# Patient Record
Sex: Female | Born: 1940 | Race: White | Hispanic: No | Marital: Married | Smoking: Never smoker
Health system: Southern US, Community
[De-identification: ages and names within clinical notes are randomized; demographics above are authoritative.]

## PROBLEM LIST (undated history)

## (undated) DIAGNOSIS — K259 Gastric ulcer, unspecified as acute or chronic, without hemorrhage or perforation: Secondary | ICD-10-CM

## (undated) DIAGNOSIS — I10 Essential (primary) hypertension: Secondary | ICD-10-CM

## (undated) DIAGNOSIS — R7303 Prediabetes: Secondary | ICD-10-CM

## (undated) DIAGNOSIS — E7801 Familial hypercholesterolemia: Secondary | ICD-10-CM

## (undated) DIAGNOSIS — I2510 Atherosclerotic heart disease of native coronary artery without angina pectoris: Secondary | ICD-10-CM

## (undated) DIAGNOSIS — D649 Anemia, unspecified: Secondary | ICD-10-CM

## (undated) DIAGNOSIS — M1990 Unspecified osteoarthritis, unspecified site: Secondary | ICD-10-CM

## (undated) DIAGNOSIS — Z9889 Other specified postprocedural states: Secondary | ICD-10-CM

## (undated) DIAGNOSIS — Z9289 Personal history of other medical treatment: Secondary | ICD-10-CM

## (undated) DIAGNOSIS — I739 Peripheral vascular disease, unspecified: Secondary | ICD-10-CM

## (undated) DIAGNOSIS — E039 Hypothyroidism, unspecified: Secondary | ICD-10-CM

## (undated) DIAGNOSIS — E785 Hyperlipidemia, unspecified: Secondary | ICD-10-CM

## (undated) DIAGNOSIS — E079 Disorder of thyroid, unspecified: Secondary | ICD-10-CM

## (undated) HISTORY — PX: CARDIAC SURGERY: SHX584

## (undated) HISTORY — DX: Hypothyroidism, unspecified: E03.9

## (undated) HISTORY — DX: Prediabetes: R73.03

## (undated) HISTORY — PX: SHOULDER SURGERY: SHX246

## (undated) HISTORY — DX: Personal history of other medical treatment: Z92.89

## (undated) HISTORY — DX: Peripheral vascular disease, unspecified: I73.9

## (undated) HISTORY — PX: APPENDECTOMY: SHX54

## (undated) HISTORY — DX: Familial hypercholesterolemia: E78.01

## (undated) HISTORY — PX: ABDOMINAL HYSTERECTOMY: SHX81

## (undated) HISTORY — PX: HEMORRHOID SURGERY: SHX153

---

## 2014-07-19 DIAGNOSIS — N6019 Diffuse cystic mastopathy of unspecified breast: Secondary | ICD-10-CM | POA: Insufficient documentation

## 2014-07-19 DIAGNOSIS — R030 Elevated blood-pressure reading, without diagnosis of hypertension: Secondary | ICD-10-CM | POA: Insufficient documentation

## 2014-07-19 DIAGNOSIS — D509 Iron deficiency anemia, unspecified: Secondary | ICD-10-CM | POA: Insufficient documentation

## 2014-07-19 DIAGNOSIS — I10 Essential (primary) hypertension: Secondary | ICD-10-CM | POA: Diagnosis present

## 2014-07-19 DIAGNOSIS — M7050 Other bursitis of knee, unspecified knee: Secondary | ICD-10-CM | POA: Insufficient documentation

## 2014-07-19 DIAGNOSIS — H903 Sensorineural hearing loss, bilateral: Secondary | ICD-10-CM | POA: Insufficient documentation

## 2014-07-19 DIAGNOSIS — J309 Allergic rhinitis, unspecified: Secondary | ICD-10-CM | POA: Insufficient documentation

## 2014-07-19 DIAGNOSIS — D126 Benign neoplasm of colon, unspecified: Secondary | ICD-10-CM | POA: Insufficient documentation

## 2014-07-19 DIAGNOSIS — K219 Gastro-esophageal reflux disease without esophagitis: Secondary | ICD-10-CM | POA: Insufficient documentation

## 2014-07-19 DIAGNOSIS — R21 Rash and other nonspecific skin eruption: Secondary | ICD-10-CM | POA: Insufficient documentation

## 2014-07-19 DIAGNOSIS — Z91040 Latex allergy status: Secondary | ICD-10-CM | POA: Insufficient documentation

## 2014-08-31 DIAGNOSIS — L570 Actinic keratosis: Secondary | ICD-10-CM | POA: Insufficient documentation

## 2014-08-31 DIAGNOSIS — R928 Other abnormal and inconclusive findings on diagnostic imaging of breast: Secondary | ICD-10-CM | POA: Insufficient documentation

## 2014-08-31 DIAGNOSIS — D126 Benign neoplasm of colon, unspecified: Secondary | ICD-10-CM | POA: Insufficient documentation

## 2014-08-31 DIAGNOSIS — K922 Gastrointestinal hemorrhage, unspecified: Secondary | ICD-10-CM | POA: Diagnosis present

## 2016-07-03 ENCOUNTER — Other Ambulatory Visit: Payer: Self-pay

## 2016-07-03 ENCOUNTER — Inpatient Hospital Stay (HOSPITAL_BASED_OUTPATIENT_CLINIC_OR_DEPARTMENT_OTHER)
Admission: EM | Admit: 2016-07-03 | Discharge: 2016-07-13 | DRG: 234 | Disposition: A | Discharge status: Home Health | Payer: Medicare Other | Attending: Surgery | Admitting: Surgery

## 2016-07-03 ENCOUNTER — Encounter (HOSPITAL_BASED_OUTPATIENT_CLINIC_OR_DEPARTMENT_OTHER): Payer: Self-pay | Admitting: *Deleted

## 2016-07-03 ENCOUNTER — Emergency Department (HOSPITAL_BASED_OUTPATIENT_CLINIC_OR_DEPARTMENT_OTHER): Payer: Medicare Other

## 2016-07-03 DIAGNOSIS — Z90710 Acquired absence of both cervix and uterus: Secondary | ICD-10-CM

## 2016-07-03 DIAGNOSIS — J90 Pleural effusion, not elsewhere classified: Secondary | ICD-10-CM

## 2016-07-03 DIAGNOSIS — J9811 Atelectasis: Secondary | ICD-10-CM

## 2016-07-03 DIAGNOSIS — Z79899 Other long term (current) drug therapy: Secondary | ICD-10-CM

## 2016-07-03 DIAGNOSIS — Z8711 Personal history of peptic ulcer disease: Secondary | ICD-10-CM

## 2016-07-03 DIAGNOSIS — I358 Other nonrheumatic aortic valve disorders: Secondary | ICD-10-CM | POA: Diagnosis present

## 2016-07-03 DIAGNOSIS — Z8249 Family history of ischemic heart disease and other diseases of the circulatory system: Secondary | ICD-10-CM

## 2016-07-03 DIAGNOSIS — Z823 Family history of stroke: Secondary | ICD-10-CM

## 2016-07-03 DIAGNOSIS — I10 Essential (primary) hypertension: Secondary | ICD-10-CM | POA: Diagnosis present

## 2016-07-03 DIAGNOSIS — I340 Nonrheumatic mitral (valve) insufficiency: Secondary | ICD-10-CM | POA: Diagnosis present

## 2016-07-03 DIAGNOSIS — Z880 Allergy status to penicillin: Secondary | ICD-10-CM

## 2016-07-03 DIAGNOSIS — K259 Gastric ulcer, unspecified as acute or chronic, without hemorrhage or perforation: Secondary | ICD-10-CM | POA: Diagnosis present

## 2016-07-03 DIAGNOSIS — E785 Hyperlipidemia, unspecified: Secondary | ICD-10-CM | POA: Diagnosis present

## 2016-07-03 DIAGNOSIS — K5900 Constipation, unspecified: Secondary | ICD-10-CM | POA: Diagnosis not present

## 2016-07-03 DIAGNOSIS — I700 Atherosclerosis of aorta: Secondary | ICD-10-CM | POA: Diagnosis present

## 2016-07-03 DIAGNOSIS — I25110 Atherosclerotic heart disease of native coronary artery with unstable angina pectoris: Principal | ICD-10-CM | POA: Diagnosis present

## 2016-07-03 DIAGNOSIS — I200 Unstable angina: Secondary | ICD-10-CM | POA: Diagnosis present

## 2016-07-03 DIAGNOSIS — Z951 Presence of aortocoronary bypass graft: Secondary | ICD-10-CM

## 2016-07-03 DIAGNOSIS — E871 Hypo-osmolality and hyponatremia: Secondary | ICD-10-CM | POA: Diagnosis present

## 2016-07-03 DIAGNOSIS — D649 Anemia, unspecified: Secondary | ICD-10-CM

## 2016-07-03 DIAGNOSIS — D62 Acute posthemorrhagic anemia: Secondary | ICD-10-CM | POA: Diagnosis not present

## 2016-07-03 DIAGNOSIS — R079 Chest pain, unspecified: Secondary | ICD-10-CM | POA: Diagnosis not present

## 2016-07-03 DIAGNOSIS — E039 Hypothyroidism, unspecified: Secondary | ICD-10-CM | POA: Diagnosis present

## 2016-07-03 DIAGNOSIS — E8770 Fluid overload, unspecified: Secondary | ICD-10-CM | POA: Diagnosis not present

## 2016-07-03 DIAGNOSIS — G43909 Migraine, unspecified, not intractable, without status migrainosus: Secondary | ICD-10-CM | POA: Diagnosis not present

## 2016-07-03 DIAGNOSIS — D696 Thrombocytopenia, unspecified: Secondary | ICD-10-CM | POA: Diagnosis not present

## 2016-07-03 DIAGNOSIS — Z66 Do not resuscitate: Secondary | ICD-10-CM | POA: Diagnosis present

## 2016-07-03 DIAGNOSIS — Z01818 Encounter for other preprocedural examination: Secondary | ICD-10-CM

## 2016-07-03 HISTORY — DX: Gastric ulcer, unspecified as acute or chronic, without hemorrhage or perforation: K25.9

## 2016-07-03 HISTORY — DX: Other specified postprocedural states: Z98.890

## 2016-07-03 HISTORY — DX: Essential (primary) hypertension: I10

## 2016-07-03 HISTORY — DX: Unspecified osteoarthritis, unspecified site: M19.90

## 2016-07-03 HISTORY — DX: Hyperlipidemia, unspecified: E78.5

## 2016-07-03 HISTORY — DX: Anemia, unspecified: D64.9

## 2016-07-03 LAB — CBC
HCT: 32 % — ABNORMAL LOW (ref 36.0–46.0)
Hemoglobin: 10.6 g/dL — ABNORMAL LOW (ref 12.0–15.0)
MCH: 30.5 pg (ref 26.0–34.0)
MCHC: 33.1 g/dL (ref 30.0–36.0)
MCV: 92 fL (ref 78.0–100.0)
Platelets: 373 10*3/uL (ref 150–400)
RBC: 3.48 MIL/uL — ABNORMAL LOW (ref 3.87–5.11)
RDW: 14.2 % (ref 11.5–15.5)
WBC: 6.7 10*3/uL (ref 4.0–10.5)

## 2016-07-03 LAB — BASIC METABOLIC PANEL
Anion gap: 9 (ref 5–15)
BUN: 19 mg/dL (ref 6–20)
CO2: 25 mmol/L (ref 22–32)
Calcium: 9.3 mg/dL (ref 8.9–10.3)
Chloride: 96 mmol/L — ABNORMAL LOW (ref 101–111)
Creatinine, Ser: 0.78 mg/dL (ref 0.44–1.00)
GFR calc Af Amer: >60 mL/min (ref >60)
GFR calc non Af Amer: >60 mL/min (ref >60)
Glucose, Bld: 98 mg/dL (ref 65–99)
Potassium: 4 mmol/L (ref 3.5–5.1)
Sodium: 130 mmol/L — ABNORMAL LOW (ref 135–145)

## 2016-07-03 LAB — TROPONIN I
Troponin I: <0.03 ng/mL (ref <0.03)
Troponin I: <0.03 ng/mL (ref <0.03)

## 2016-07-03 LAB — PROTIME-INR
INR: 0.81
Prothrombin Time: 11.2 seconds — ABNORMAL LOW (ref 11.4–15.2)

## 2016-07-03 MED ORDER — ENOXAPARIN SODIUM 40 MG/0.4ML ~~LOC~~ SOLN
40.0000 mg | SUBCUTANEOUS | Status: DC
  Administered 2016-07-03: 40 mg via SUBCUTANEOUS
  Filled 2016-07-03: qty 0.4

## 2016-07-03 MED ORDER — ASPIRIN 81 MG PO CHEW
162.0000 mg | CHEWABLE_TABLET | Freq: Once | ORAL | Status: AC
  Administered 2016-07-03: 162 mg via ORAL
  Filled 2016-07-03: qty 2

## 2016-07-03 MED ORDER — ASPIRIN 81 MG PO CHEW: 81.0000 mg | CHEWABLE_TABLET | Freq: Once | ORAL | Status: DC

## 2016-07-03 MED ORDER — TRAMADOL HCL 50 MG PO TABS
50.0000 mg | ORAL_TABLET | Freq: Every day | ORAL | Status: DC
  Administered 2016-07-03 – 2016-07-06 (×4): 50 mg via ORAL
  Filled 2016-07-03 (×4): qty 1

## 2016-07-03 MED ORDER — NITROGLYCERIN 0.4 MG SL SUBL
0.4000 mg | SUBLINGUAL_TABLET | SUBLINGUAL | Status: DC | PRN
  Administered 2016-07-03 (×3): 0.4 mg via SUBLINGUAL
  Filled 2016-07-03 (×3): qty 1

## 2016-07-03 MED ORDER — NITROGLYCERIN 2 % TD OINT
0.5000 [in_us] | TOPICAL_OINTMENT | Freq: Four times a day (QID) | TRANSDERMAL | Status: DC
  Administered 2016-07-03 – 2016-07-04 (×3): 0.5 [in_us] via TOPICAL
  Filled 2016-07-03: qty 30
  Filled 2016-07-03: qty 1

## 2016-07-03 MED ORDER — ACETAMINOPHEN 325 MG PO TABS
650.0000 mg | ORAL_TABLET | Freq: Once | ORAL | Status: AC
  Administered 2016-07-03: 650 mg via ORAL
  Filled 2016-07-03: qty 2

## 2016-07-03 MED ORDER — ONDANSETRON HCL 4 MG/2ML IJ SOLN
4.0000 mg | Freq: Four times a day (QID) | INTRAMUSCULAR | Status: DC | PRN
  Administered 2016-07-04: 4 mg via INTRAVENOUS
  Filled 2016-07-03: qty 2

## 2016-07-03 MED ORDER — ACETAMINOPHEN 325 MG PO TABS
650.0000 mg | ORAL_TABLET | ORAL | Status: DC | PRN
  Administered 2016-07-04 (×2): 650 mg via ORAL
  Filled 2016-07-03 (×2): qty 2

## 2016-07-03 MED ORDER — MORPHINE SULFATE (PF) 2 MG/ML IV SOLN: 2.0000 mg | INTRAVENOUS | Status: DC | PRN

## 2016-07-03 MED ORDER — AMLODIPINE BESYLATE 5 MG PO TABS
5.0000 mg | ORAL_TABLET | Freq: Every day | ORAL | Status: DC
  Administered 2016-07-04 – 2016-07-06 (×3): 5 mg via ORAL
  Filled 2016-07-03 (×3): qty 1

## 2016-07-03 MED ORDER — ROPINIROLE HCL 0.5 MG PO TABS
0.5000 mg | ORAL_TABLET | Freq: Every day | ORAL | Status: DC
  Administered 2016-07-03 – 2016-07-06 (×4): 0.5 mg via ORAL
  Filled 2016-07-03 (×4): qty 1

## 2016-07-03 NOTE — ED Notes (Signed)
Attempted report to tina, on 3W at [facility].

## 2016-07-03 NOTE — H&P (Signed)
History and Physical    Katherine Hall [id number] DOB: 1941-03-28 DOA: 07/03/2016   PCP: No PCP Per Patient Chief Complaint:  Chief Complaint  Patient presents with  . Chest Pain    HPI: Katherine Hall is a 75 y.o. female with medical history significant of HTN, HLD, extensive family history of CAD.  Patient presents to the ED at [facility] with c/o chest pain.  Symptoms onset this morning with radiation to her throat.  Improved with 2 baby ASA this morning before recurring.  She presents to the ED for evaluation.  Pain has been intermittent since its sudden onset.  Mild SOB.  Currently CP free after NTG patch put on although now she has a migraine headache.  Review of Systems: As per HPI otherwise 10 point review of systems negative.    Past Medical History:  Diagnosis Date  . HLD (hyperlipidemia)   . HTN (hypertension)     Past Surgical History:  Procedure Laterality Date  . ABDOMINAL HYSTERECTOMY    . APPENDECTOMY    . CARDIAC SURGERY     cyst, not on heart.     reports that she has never smoked. She has never used smokeless tobacco. She reports that she does not drink alcohol or use drugs.  Allergies  Allergen Reactions  . Penicillins     Family History  Problem Relation Age of Onset  . Coronary artery disease Mother   . Coronary artery disease Father   . Coronary artery disease Sister   . Coronary artery disease Brother       Prior to Admission medications   Medication Sig Start Date End Date Taking? Authorizing Provider  acyclovir (ZOVIRAX) 400 MG tablet Take 400 mg by mouth 5 (five) times daily.   Yes Historical Provider, MD  amLODipine (NORVASC) 5 MG tablet Take 5 mg by mouth daily.   Yes Historical Provider, MD    Physical Exam: Vitals:   07/03/16 2000 07/03/16 2015 07/03/16 2019 07/03/16 2106  BP: 129/66  142/59 (!) 168/66  Pulse: (!) 59 64 69 66  Resp: 18 15 16 20   Temp:   98.9 F (37.2 C) 98.3 F (36.8 C)  TempSrc:    Oral  SpO2: 94% 97% 97% 97%    Weight:    54.7 kg (120 lb 11.2 oz)  Height:    4\' 10"  (1.473 m)      Constitutional: NAD, calm, comfortable Eyes: PERRL, lids and conjunctivae normal ENMT: Mucous membranes are moist. Posterior pharynx clear of any exudate or lesions.Normal dentition.  Neck: normal, supple, no masses, no thyromegaly Respiratory: clear to auscultation bilaterally, no wheezing, no crackles. Normal respiratory effort. No accessory muscle use.  Cardiovascular: Regular rate and rhythm, no murmurs / rubs / gallops. No extremity edema. 2+ pedal pulses. No carotid bruits.  Abdomen: no tenderness, no masses palpated. No hepatosplenomegaly. Bowel sounds positive.  Musculoskeletal: no clubbing / cyanosis. No joint deformity upper and lower extremities. Good ROM, no contractures. Normal muscle tone.  Skin: no rashes, lesions, ulcers. No induration Neurologic: CN 2-12 grossly intact. Sensation intact, DTR normal. Strength 5/5 in all 4.  Psychiatric: Normal judgment and insight. Alert and oriented x 3. Normal mood.    Labs on Admission: I have personally reviewed following labs and imaging studies  CBC:  Recent Labs Lab 07/03/16 1620  WBC 6.7  HGB 10.6*  HCT 32.0*  MCV 92.0  PLT [id number]   Basic Metabolic Panel:  Recent Labs Lab 07/03/16 1620  NA 130*  K 4.0  CL 96*  CO2 25  GLUCOSE 98  BUN 19  CREATININE 0.78  CALCIUM 9.3   GFR: Estimated Creatinine Clearance: 44.5 mL/min (by C-G formula based on SCr of 0.8 mg/dL). Liver Function Tests: No results for input(s): AST, ALT, ALKPHOS, BILITOT, PROT, ALBUMIN in the last 168 hours. No results for input(s): LIPASE, AMYLASE in the last 168 hours. No results for input(s): AMMONIA in the last 168 hours. Coagulation Profile:  Recent Labs Lab 07/03/16 1620  INR 0.81   Cardiac Enzymes:  Recent Labs Lab 07/03/16 1620  TROPONINI <0.03   BNP (last 3 results) No results for input(s): PROBNP in the last 8760 hours. HbA1C: No results for input(s):  HGBA1C in the last 72 hours. CBG: No results for input(s): GLUCAP in the last 168 hours. Lipid Profile: No results for input(s): CHOL, HDL, LDLCALC, TRIG, CHOLHDL, LDLDIRECT in the last 72 hours. Thyroid Function Tests: No results for input(s): TSH, T4TOTAL, FREET4, T3FREE, THYROIDAB in the last 72 hours. Anemia Panel: No results for input(s): VITAMINB12, FOLATE, FERRITIN, TIBC, IRON, RETICCTPCT in the last 72 hours. Urine analysis: No results found for: COLORURINE, APPEARANCEUR, LABSPEC, PHURINE, GLUCOSEU, HGBUR, BILIRUBINUR, KETONESUR, PROTEINUR, UROBILINOGEN, NITRITE, LEUKOCYTESUR Sepsis Labs: @LABRCNTIP (procalcitonin:4,lacticidven:4) )No results found for this or any previous visit (from the past 240 hour(s)).   Radiological Exams on Admission: Dg Chest 2 View  Result Date: 07/03/2016 CLINICAL DATA:  Chest pain under left breast for 2 days. EXAM: CHEST  2 VIEW COMPARISON:  None. FINDINGS: The heart size is normal. The lungs are clear. The visualized soft tissues and bony thorax are unremarkable. Patient is status post median sternotomy. Atherosclerotic calcifications are present in the aorta. Slight curvature is present of thoraco lumbar spine. Degenerative changes of the shoulder are worse on the right. IMPRESSION: 1. No acute cardiopulmonary disease. Electronically Signed   By: [name] M.D.   On: 07/03/2016 17:30   EKG: Independently reviewed.  Assessment/Plan Principal Problem:   Chest pain    Chest pain -  CP obs pathway  Serial trops  Tele monitor  Stress test in AM  NPO after midnight   DVT prophylaxis: Lovenox Code Status: DNR - confirmed with patient Family Communication: No family in room Consults called: None Admission status: Admit to obs   Khamani Fairley, [place] [facility] Pager [phone] from 7PM-7AM  If 7AM-7PM, please contact the day physician for the patient [email or web address] Password [email or web address]  07/03/2016, 10:00 PM

## 2016-07-03 NOTE — Progress Notes (Signed)
Patient ID: Katherine Hall, female   DOB: 10/10/1941, 75 y.o.   MRN: [id number]  Accepted to telemetry bed/Observation.  Please call the floor manager at extension [phone] once the patient arrives to the unit for admitting physician assignment.        Per Dr. Gilford Raid.  . Chest Pain    HPI Comments: [name] is a 75 y.o. female with h/o cardiac surgery (cyst) who presents to the Emergency Department complaining of sudden onset, constant left chest pain radiating to her throat onset one day ago. Pt also has associated chills and mild shortness of breath. Pt is currently ambulatory. Pt took two baby Aspirin this morning with mild relief. She is compliant with her medications. Pt has not had a recent stress test in a while. Family h/o heart disease. Denies abdominal pain and leg swelling.  EKG Vent. rate 63 BPM PR interval 168 ms QRS duration 84 ms QT/QTc 398/407 ms P-R-T axes 66 57 56 Normal sinus rhythm Cannot rule out Anterior infarct , age undetermined Abnormal ECG   Component Value Units  Troponin I [id number] Collected: 07/03/16 1620  Updated: 07/03/16 1655   Specimen Type: Blood   Specimen Source: Vein    Troponin I <0.03 ng/mL  Basic metabolic panel [id number] (Abnormal) Collected: 07/03/16 1620  Updated: 07/03/16 1651   Specimen Type: Blood    Sodium 130 (L) mmol/L   Potassium 4.0 mmol/L   Chloride 96 (L) mmol/L   CO2 25 mmol/L   Glucose, Bld 98 mg/dL   BUN 19 mg/dL   Creatinine, Ser 0.78 mg/dL   Calcium 9.3 mg/dL   GFR calc non Af Amer >60 mL/min   GFR calc Af Amer >60 mL/min   Anion gap 9  Protime-INR [id number] (Abnormal) Collected: 07/03/16 1620  Updated: 07/03/16 1643   Specimen Type: Blood    Prothrombin Time 11.2 (L) seconds   INR 0.81  CBC [id number] (Abnormal) Collected: 07/03/16 1620  Updated: 07/03/16 1629   Specimen Type: Blood    WBC 6.7 K/uL   RBC 3.48 (L) MIL/uL   Hemoglobin 10.6 (L) g/dL   HCT 32.0 (L) %   MCV 92.0 fL   MCH 30.5 pg    MCHC 33.1 g/dL   RDW 14.2 %   Platelets 373 K/uL   CLINICAL DATA:  Chest pain under left breast for 2 days. EXAM: CHEST  2 VIEW COMPARISON:  None. FINDINGS: The heart size is normal. The lungs are clear. The visualized soft tissues and bony thorax are unremarkable. Patient is status post median sternotomy. Atherosclerotic calcifications are present in the aorta. Slight curvature is present of thoraco lumbar spine. Degenerative changes of the shoulder are worse on the right. IMPRESSION: 1. No acute cardiopulmonary disease. Electronically Signed   By: [name] M.D.   On: 07/03/2016 17:30   Tennis Must, MD [phone].

## 2016-07-03 NOTE — ED Notes (Signed)
MD at bedside. 

## 2016-07-03 NOTE — ED Provider Notes (Signed)
[name] Note   CSN: [id number] Arrival date & time: 07/03/16  1551  First Provider Contact:  First MD Initiated Contact with Patient 07/03/16 1603   By signing my name below, I, Katherine Hall, attest that this documentation has been prepared under the direction and in the presence of [name], MD. Electronically Signed: [name], ED Scribe. 07/03/16. 4:09 PM.  History   Chief Complaint Chief Complaint  Patient presents with  . Chest Pain    HPI Comments: Katherine Hall is a 75 y.o. female with h/o cardiac surgery (cyst) who presents to the Emergency Department complaining of sudden onset, intermittent left chest pain radiating to her throat  onset one day ago. Pt also has associated chills and mild shortness of breath. Pt is currently ambulatory. Pt took two baby Aspirin this morning with mild relief. She is compliant with her medications. Pt has not had a recent stress test in a while. Family h/o heart disease. Denies abdominal pain and leg swelling.    The history is provided by the patient. No language interpreter was used.    History reviewed. No pertinent past medical history.  Patient Active Problem List   Diagnosis Date Noted  . Chest pain 07/03/2016    Past Surgical History:  Procedure Laterality Date  . ABDOMINAL HYSTERECTOMY    . APPENDECTOMY    . CARDIAC SURGERY     cyst, not on heart.    OB History    No data available       Home Medications    Prior to Admission medications   Medication Sig Start Date End Date Taking? Authorizing Provider  acyclovir (ZOVIRAX) 400 MG tablet Take 400 mg by mouth 5 (five) times daily.   Yes Historical Provider, MD  amLODipine (NORVASC) 5 MG tablet Take 5 mg by mouth daily.   Yes Historical Provider, MD    Family History History reviewed. No pertinent family history.  Social History Social History  Substance Use Topics  . Smoking status: Never Smoker  . Smokeless tobacco: Never Used  .  Alcohol use No     Allergies   Penicillins   Review of Systems Review of Systems  10 Systems reviewed and all are negative for acute change except as noted in the HPI. Physical Exam Updated Vital Signs BP 127/57   Pulse (!) 59   Resp 14   SpO2 97%   Physical Exam  Constitutional: She is oriented to person, place, and time. She appears well-developed and well-nourished. No distress.  HENT:  Head: Normocephalic.  Eyes: Conjunctivae are normal. Pupils are equal, round, and reactive to light. No scleral icterus.  Neck: Normal range of motion. Neck supple. No thyromegaly present.  Cardiovascular: Normal rate, regular rhythm and normal heart sounds.  Exam reveals no gallop and no friction rub.   No murmur heard. Pulmonary/Chest: Effort normal and breath sounds normal. No respiratory distress. She has no wheezes. She has no rales.  Abdominal: Soft. Bowel sounds are normal. She exhibits no distension. There is no tenderness. There is no rebound.  Musculoskeletal: Normal range of motion.  Neurological: She is alert and oriented to person, place, and time.  Skin: Skin is warm and dry. No rash noted.  Psychiatric: She has a normal mood and affect. Her behavior is normal.   ED Treatments / Results  DIAGNOSTIC STUDIES: Oxygen Saturation is 95% on RA, adequate by my interpretation.    COORDINATION OF CARE: 4:06 PM Discussed treatment plan with pt  at bedside which includes lab work and NTG and pt agreed to plan.  Labs (all labs ordered are listed, but only abnormal results are displayed) Labs Reviewed  BASIC METABOLIC PANEL - Abnormal; Notable for the following:       Result Value   Sodium 130 (*)    Chloride 96 (*)    All other components within normal limits  CBC - Abnormal; Notable for the following:    RBC 3.48 (*)    Hemoglobin 10.6 (*)    HCT 32.0 (*)    All other components within normal limits  PROTIME-INR - Abnormal; Notable for the following:    Prothrombin Time 11.2  (*)    All other components within normal limits  TROPONIN I    EKG  EKG Interpretation  Date/Time:  Sunday July 03 2016 15:54:15 EDT Ventricular Rate:  63 PR Interval:  168 QRS Duration: 84 QT Interval:  398 QTC Calculation: 407 R Axis:   57 Text Interpretation:  Normal sinus rhythm Cannot rule out Anterior infarct , age undetermined Abnormal ECG Confirmed by [facility] MD, Hayly Litsey ([id number]) on 07/03/2016 3:57:56 PM       Radiology Dg Chest 2 View  Result Date: 07/03/2016 CLINICAL DATA:  Chest pain under left breast for 2 days. EXAM: CHEST  2 VIEW COMPARISON:  None. FINDINGS: The heart size is normal. The lungs are clear. The visualized soft tissues and bony thorax are unremarkable. Patient is status post median sternotomy. Atherosclerotic calcifications are present in the aorta. Slight curvature is present of thoraco lumbar spine. Degenerative changes of the shoulder are worse on the right. IMPRESSION: 1. No acute cardiopulmonary disease. Electronically Signed   By: [name] M.D.   On: 07/03/2016 17:30   Procedures Procedures (including critical care time)  Medications Ordered in ED Medications  nitroGLYCERIN (NITROSTAT) SL tablet 0.4 mg (0.4 mg Sublingual Given 07/03/16 1713)  nitroGLYCERIN (NITROGLYN) 2 % ointment 0.5 inch (0.5 inches Topical Given 07/03/16 1721)     Initial Impression / Assessment and Plan / ED Course  I have reviewed the triage vital signs and the nursing notes.  Pertinent labs & imaging results that were available during my care of the patient were reviewed by me and considered in my medical decision making (see chart for details).  Clinical Course    Pt's chest pain gone with nitro.  Heart score of 5.  No recent stress or cath.  I spoke with Dr. Olevia Bowens ([facility]ad) regarding observation stay and he will admit pt at [facility].  Final Clinical Impressions(s) / ED Diagnoses   Final diagnoses:  Chest pain, unspecified chest pain type    New  Prescriptions New Prescriptions   No medications on file  I personally performed the services described in this documentation, which was scribed in my presence. The recorded information has been reviewed and is accurate.     [name], MD 07/03/16 [phone]

## 2016-07-03 NOTE — ED Notes (Signed)
Pt left with [facility] with no acute distress at this time, pt alert and oriented.

## 2016-07-03 NOTE — ED Triage Notes (Signed)
Left chest pain radiating into her throat since yesterday.  Denies N/V.  Reports slight SOB-no trouble speaking.  Pt ambulatory on arrival.

## 2016-07-04 ENCOUNTER — Encounter (HOSPITAL_COMMUNITY): Payer: Self-pay | Admitting: Physician Assistant

## 2016-07-04 DIAGNOSIS — I200 Unstable angina: Secondary | ICD-10-CM | POA: Diagnosis not present

## 2016-07-04 DIAGNOSIS — E785 Hyperlipidemia, unspecified: Secondary | ICD-10-CM | POA: Diagnosis present

## 2016-07-04 DIAGNOSIS — E039 Hypothyroidism, unspecified: Secondary | ICD-10-CM

## 2016-07-04 DIAGNOSIS — I10 Essential (primary) hypertension: Secondary | ICD-10-CM | POA: Diagnosis present

## 2016-07-04 DIAGNOSIS — R079 Chest pain, unspecified: Secondary | ICD-10-CM | POA: Diagnosis not present

## 2016-07-04 DIAGNOSIS — I209 Angina pectoris, unspecified: Secondary | ICD-10-CM

## 2016-07-04 LAB — HEPARIN LEVEL (UNFRACTIONATED): Heparin Unfractionated: 0.69 IU/mL (ref 0.30–0.70)

## 2016-07-04 LAB — TROPONIN I
Troponin I: <0.03 ng/mL (ref <0.03)
Troponin I: <0.03 ng/mL (ref <0.03)

## 2016-07-04 MED ORDER — PANTOPRAZOLE SODIUM 40 MG PO TBEC
40.0000 mg | DELAYED_RELEASE_TABLET | Freq: Every day | ORAL | Status: DC
  Administered 2016-07-04 – 2016-07-06 (×3): 40 mg via ORAL
  Filled 2016-07-04 (×3): qty 1

## 2016-07-04 MED ORDER — LEVOTHYROXINE SODIUM 50 MCG PO TABS: 50.0000 ug | ORAL_TABLET | Freq: Every day | ORAL | Status: DC

## 2016-07-04 MED ORDER — HEPARIN BOLUS VIA INFUSION
3000.0000 [IU] | Freq: Once | INTRAVENOUS | Status: AC
  Administered 2016-07-04: 3000 [IU] via INTRAVENOUS
  Filled 2016-07-04: qty 3000

## 2016-07-04 MED ORDER — TRAZODONE HCL 50 MG PO TABS
25.0000 mg | ORAL_TABLET | Freq: Once | ORAL | Status: AC
  Administered 2016-07-04: 25 mg via ORAL
  Filled 2016-07-04: qty 1

## 2016-07-04 MED ORDER — LEVOTHYROXINE SODIUM 50 MCG PO TABS
50.0000 ug | ORAL_TABLET | Freq: Every day | ORAL | Status: DC
  Administered 2016-07-05 – 2016-07-09 (×4): 50 ug via ORAL
  Filled 2016-07-04 (×5): qty 1

## 2016-07-04 MED ORDER — TRAMADOL HCL 50 MG PO TABS
50.0000 mg | ORAL_TABLET | Freq: Three times a day (TID) | ORAL | Status: DC | PRN
  Administered 2016-07-04: 50 mg via ORAL
  Filled 2016-07-04: qty 1

## 2016-07-04 MED ORDER — ASPIRIN EC 81 MG PO TBEC
81.0000 mg | DELAYED_RELEASE_TABLET | Freq: Every day | ORAL | Status: DC
  Administered 2016-07-04 – 2016-07-06 (×2): 81 mg via ORAL
  Filled 2016-07-04 (×2): qty 1

## 2016-07-04 MED ORDER — SODIUM CHLORIDE 0.9 % WEIGHT BASED INFUSION: 1.0000 mL/kg/h | INTRAVENOUS | Status: DC

## 2016-07-04 MED ORDER — PROCHLORPERAZINE EDISYLATE 5 MG/ML IJ SOLN
10.0000 mg | Freq: Four times a day (QID) | INTRAMUSCULAR | Status: DC | PRN
  Administered 2016-07-04: 10 mg via INTRAVENOUS
  Filled 2016-07-04 (×2): qty 2

## 2016-07-04 MED ORDER — ACYCLOVIR 400 MG PO TABS
400.0000 mg | ORAL_TABLET | Freq: Every day | ORAL | Status: DC
  Filled 2016-07-04 (×2): qty 1

## 2016-07-04 MED ORDER — HEPARIN (PORCINE) IN NACL 100-0.45 UNIT/ML-% IJ SOLN
650.0000 [IU]/h | INTRAMUSCULAR | Status: DC
  Administered 2016-07-04: 650 [IU]/h via INTRAVENOUS
  Filled 2016-07-04: qty 250

## 2016-07-04 MED ORDER — ASPIRIN 81 MG PO CHEW
81.0000 mg | CHEWABLE_TABLET | ORAL | Status: AC
  Administered 2016-07-04 – 2016-07-05 (×2): 81 mg via ORAL
  Filled 2016-07-04: qty 1

## 2016-07-04 MED ORDER — PROPRANOLOL HCL 20 MG PO TABS
20.0000 mg | ORAL_TABLET | Freq: Two times a day (BID) | ORAL | Status: DC
  Administered 2016-07-04 – 2016-07-06 (×6): 20 mg via ORAL
  Filled 2016-07-04 (×7): qty 1

## 2016-07-04 MED ORDER — SODIUM CHLORIDE 0.9 % WEIGHT BASED INFUSION
3.0000 mL/kg/h | INTRAVENOUS | Status: DC
  Administered 2016-07-05: 3 mL/kg/h via INTRAVENOUS

## 2016-07-04 MED FILL — Fentanyl Citrate Preservative Free (PF) Inj 100 MCG/2ML: INTRAMUSCULAR | Qty: 2 | Status: AC

## 2016-07-04 NOTE — Progress Notes (Signed)
ANTICOAGULATION CONSULT NOTE - Initial Consult  Pharmacy Consult for Heparin Indication: chest pain/ACS  Allergies  Allergen Reactions  . Penicillins Anaphylaxis    Has patient had a PCN reaction causing immediate rash, facial/tongue/throat swelling, SOB or lightheadedness with hypotension: Yes Has patient had a PCN reaction causing severe rash involving mucus membranes or skin necrosis: No Has patient had a PCN reaction that required hospitalization Yes Has patient had a PCN reaction occurring within the last 10 years: No If all of the above answers are "NO", then may proceed with Cephalosporin use.     Patient Measurements: Height: 4\' 10"  (147.3 cm) Weight: 118 lb 14.4 oz (53.9 kg) IBW/kg (Calculated) : 40.9 Heparin Dosing Weight: 53.9 kg  Vital Signs: Temp: 98.3 F (36.8 C) (07/31 0500) Temp Source: Oral (07/31 0500) BP: 116/62 (07/31 0725) Pulse Rate: 75 (07/31 0500)  Labs:  Recent Labs  07/03/16 1620 07/03/16 2201 07/04/16 0040 07/04/16 0408  HGB 10.6*  --   --   --   HCT 32.0*  --   --   --   PLT 373  --   --   --   LABPROT 11.2*  --   --   --   INR 0.81  --   --   --   CREATININE 0.78  --   --   --   TROPONINI <0.03 <0.03 <0.03 <0.03    Estimated Creatinine Clearance: 44.2 mL/min (by C-G formula based on SCr of 0.8 mg/dL).   Medical History: Past Medical History:  Diagnosis Date  . Anemia   . Arthritis   . Gastric ulcer    a. h/o bleeding ulcer 2009 requiring 3 pints of blood.  [name] History of removal of cyst    a. 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  [name] HLD (hyperlipidemia)    a. h/o intolerance of statins, prev on Repatha but insurance stopped covering.  [name] HTN (hypertension)    Assessment:   75 yr old female to begin IV heparin for chest pain.   Lovenox 40 mg sq given last night.   Plan cardiac cath on 07/05/16.  Goal of Therapy:  Heparin level 0.3-0.7 units/ml Monitor platelets by anticoagulation protocol: Yes    Plan:    Heparin 3000 units IV bolus.   Heparin drip to begin at 650 units/hr.   Heparin level ~6 hrs after drip begins.   Daily heparin level and CBC while on heparin.  Arty Baumgartner, [place] Pager: [phone] 07/04/2016,10:00 AM

## 2016-07-04 NOTE — Care Management Obs Status (Signed)
[place] NOTIFICATION   Patient Details  Name: Treba Beckius MRN: [id number] Date of Birth: 16-Dec-1940   Medicare Observation Status Notification Given:  Yes    Bethena Roys, RN 07/04/2016, 3:49 PM

## 2016-07-04 NOTE — Plan of Care (Signed)
Problem: Consults Goal: Cardiac Cath Patient Education (See Patient Education module for education specifics.) Outcome: Completed/Met Date Met: 07/04/16 Pt received education regarding cardiac cath   Problem: Consults Goal: Chest Pain Patient Education (See Patient Education module for education specifics.) Outcome: Completed/Met Date Met: 07/04/16 Pt received education regarding chest pain   Problem: Phase I Progression Outcomes Goal: Anginal pain relieved Outcome: Completed/Met Date Met: 07/04/16 Chest pain is being relieved with nitro paste to chest Goal: Aspirin unless contraindicated Outcome: Completed/Met Date Met: 07/04/16 Pt has aspirin ordered Goal: MD aware of Cardiac Marker results Outcome: Completed/Met Date Met: 07/04/16 MD is aware of pt cardiac markers

## 2016-07-04 NOTE — Consult Note (Signed)
Cardiology Consultation Note    Patient ID: Katherine Hall, MRN: [id number], DOB/AGE: [birth year] 75 y.o. Admit date: 07/03/2016   Date of Consult: 07/04/2016 Primary Physician: No PCP Per Patient Primary Cardiologist: new; prev seen by [facility]  Chief Complaint: chest pain Reason for Consultation: chest pain Requesting MD: Dr. Alcario Drought  HPI: Katherine Hall is a 75 y.o. 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 She describes remote cath in the 1990s that was normal. She was in her [place] yesterday watching a ball game when she developed chest pain described as a dull ache/tightness. It would come and go all day, the longest episode lasting 1 hour, without any provoking factors - not worse with inspiration, palpation or movement. She tried to remain very still and it would ease off on its own. She took baby ASA in the evening and went to bed. The next morning while at church she developed recurrent pain and felt like she needed to grab her chest. The pain scared her so she presented to the ER where 3 SL NTG progressively relieved the pain. She had associated SOB with the CP. No nausea. She has had hot flashes recently but without particular relationship to the CP - went off HRT recently. She has noticed she's had a lot of indigestion lately at night but hasn't been able to pinpoint it to any particular foods. She has felt this in her throat as well. She remains pain free, just hungry. Trop neg x 3. EKG nonacute.   She reports prior intolerance to statin and was on Repatha at one point but insurance stopped paying.  Family history notable for: - father died of MI at 77 - sister died of MI at 46 - another sister has 44 stents - brother had MI at 67 and died at 45 - another brother had 4 MIs and 2 CABGs - multiple  aunts/uncles with coronary disease  Past Medical History:  Diagnosis Date  . Arthritis   . HLD (hyperlipidemia)   . HTN (hypertension)       Surgical History:  Past Surgical History:  Procedure Laterality Date  . ABDOMINAL HYSTERECTOMY    . APPENDECTOMY    . CARDIAC SURGERY     cyst, not on heart.  [name] HEMORRHOID SURGERY    . SHOULDER SURGERY       Home Meds: Prior to Admission medications   Medication Sig Start Date End Date Taking? Authorizing Provider  acyclovir (ZOVIRAX) 400 MG tablet Take 400 mg by mouth 5 (five) times daily.   Yes Historical Provider, MD  amLODipine (NORVASC) 5 MG tablet Take 5 mg by mouth daily.   Yes Historical Provider, MD  levothyroxine (SYNTHROID, LEVOTHROID) 50 MCG tablet Take 50 mcg by mouth daily. 03/31/16  Yes Historical Provider, MD  lisinopril (PRINIVIL,ZESTRIL) 20 MG tablet Take 20 mg by mouth daily. 03/31/16  Yes Historical Provider, MD  pantoprazole (PROTONIX) 40 MG tablet Take 40 mg by mouth daily. 03/31/16  Yes Historical Provider, MD  propranolol (INDERAL) 20 MG tablet Take 20 mg by mouth 2 (two) times daily. 03/31/16  Yes Historical Provider, MD  rOPINIRole (REQUIP) 0.5 MG tablet Take 0.5 mg by mouth at bedtime. 05/11/16  Yes Historical Provider, MD  traMADol (ULTRAM) 50 MG tablet Take 50 mg by mouth 3 (three) times daily as needed for  pain. 05/09/16  Yes Historical Provider, MD    Inpatient Medications:  . amLODipine  5 mg Oral Daily  . enoxaparin (LOVENOX) injection  40 mg Subcutaneous Q24H  . nitroGLYCERIN  0.5 inch Topical Q6H  . rOPINIRole  0.5 mg Oral QHS  . traMADol  50 mg Oral QHS      Allergies:  Allergies  Allergen Reactions  . Penicillins Anaphylaxis    Has patient had a PCN reaction causing immediate rash, facial/tongue/throat swelling, SOB or lightheadedness with hypotension: Yes Has patient had a PCN reaction causing severe rash involving mucus membranes or skin necrosis: No Has patient had a PCN reaction that required  hospitalization Yes Has patient had a PCN reaction occurring within the last 10 years: No If all of the above answers are "NO", then may proceed with Cephalosporin use.     Social History   Social History  . Marital status: Married    Spouse name: N/A  . Number of children: N/A  . Years of education: N/A   Occupational History  . Not on file.   Social History Main Topics  . Smoking status: Never Smoker  . Smokeless tobacco: Never Used  . Alcohol use No  . Drug use: No  . Sexual activity: Not on file   Other Topics Concern  . Not on file   Social History Narrative  . No narrative on file     Family History  Problem Relation Age of Onset  . Stroke Mother     Stroke age 4  . CAD Father     died of MI age 56  . Coronary artery disease Sister     one sister had massive MI at 31, another sister has 36 stents  . Coronary artery disease Brother     one brother had MI at age 61 and died at 48, another has had 4 MIs and 2 bypasses     Review of Systems:dnies any bleeding issues All other systems reviewed and are otherwise negative except as noted above.  Labs:  Recent Labs  07/03/16 1620 07/03/16 2201 07/04/16 0040 07/04/16 0408  TROPONINI <0.03 <0.03 <0.03 <0.03   Lab Results  Component Value Date   WBC 6.7 07/03/2016   HGB 10.6 (L) 07/03/2016   HCT 32.0 (L) 07/03/2016   MCV 92.0 07/03/2016   PLT 373 07/03/2016    Recent Labs Lab 07/03/16 1620  NA 130*  K 4.0  CL 96*  CO2 25  BUN 19  CREATININE 0.78  CALCIUM 9.3  GLUCOSE 98    Radiology/Studies:  Dg Chest 2 View  Result Date: 07/03/2016 CLINICAL DATA:  Chest pain under left breast for 2 days. EXAM: CHEST  2 VIEW COMPARISON:  None. FINDINGS: The heart size is normal. The lungs are clear. The visualized soft tissues and bony thorax are unremarkable. Patient is status post median sternotomy. Atherosclerotic calcifications are present in the aorta. Slight curvature is present of thoraco lumbar  spine. Degenerative changes of the shoulder are worse on the right. IMPRESSION: 1. No acute cardiopulmonary disease. Electronically Signed   By: [name] M.D.   On: 07/03/2016 17:30   Wt Readings from Last 3 Encounters:  07/04/16 118 lb 14.4 oz (53.9 kg)    EKG: NSR 63bpm cannot rule out old anterior MI, otherwise no acute ST-T changes  Physical Exam: Blood pressure 116/62, pulse 75, temperature 98.3 F (36.8 C), temperature source Oral, resp. rate 20, height 4\' 10"  (1.473 m), weight 118 lb  14.4 oz (53.9 kg), SpO2 98 %. Body mass index is 24.85 kg/m. General: Well developed, well nourished WF, in no acute distress. Head: Normocephalic, atraumatic, sclera non-icteric, no xanthomas, nares are without discharge.  Neck: Negative for carotid bruits. JVD not elevated. Lungs: Clear bilaterally to auscultation without wheezes, rales, or rhonchi. Breathing is unlabored. Heart: RRR with S1 S2. No murmurs, rubs, or gallops appreciated. Abdomen: Soft, non-tender, non-distended with normoactive bowel sounds. No hepatomegaly. No rebound/guarding. No obvious abdominal masses. Msk:  Strength and tone appear normal for age. Extremities: No clubbing or cyanosis. No edema.  Distal pedal pulses are 2+ and equal bilaterally. Neuro: Alert and oriented X 3. No facial asymmetry. No focal deficit. Moves all extremities spontaneously. Psych:  Responds to questions appropriately with a normal affect.     Assessment and Plan   1. Chest pain concerning for [place] - EKG and trops are nonischemic but chest pain features are concerning for [place]. Family history of CAD is quite striking as well. We feel definitive cath is indicated. Risks and benefits of cardiac catheterization have been discussed with the patient.  These include bleeding, infection, kidney damage, stroke, heart attack, death.  The patient understands these risks and is willing to proceed. The board is full for today so will add on for tomorrow.  Start daily aspirin. D/c Lovenox and start heparin per pharmacy. Continue NTG paste. Resume home BB.   2. HTN - it appears her home propranolol and lisinopril have not been continued per IM. I will restart propranolol. Follow BP on NTG patch and resume lisinopril if indicated.  3. Hyperlipidemia - check lipids in AM. If she is in fact found to have CAD, insurance would probably begin covering PCSK9. She used to be on Repatha. She has h/o intolerance to statins.  4. Anemia - patient reports this is Chronic. She denies any signs of bleeding. Add hemoccult. F/u CBC in AM.  5. Hyponatremia - no sx of CHF on exam. Further management per IM.  Note to primary team: several of her home meds not resumed - will defer to IM to do so.   Signed, Charlie Pitter PA-C 07/04/2016, 8:13 AM Pager: [phone]   Attending Note:   The patient was seen and examined.  Agree with assessment and plan as noted above.  Changes made to the above note as needed.  Patient seen and independently examined with Melina Copa, PA .   We discussed all aspects of the encounter. I agree with the assessment and plan as stated above.  Pt has had a year of progressive DOE - unable to walk up her driveway for the past several months.  Presents with very worrisome CP - tightness, Relieved with 3 SL NTG. Very strong family hx of CAD and premature death.   I think our best approach is to do a cath. The schedule is full today - will place her on heparin and plan on cath tomorrow .  I have discussed the risks, benefits, options of cardiac cath. She understands and agrees to proceed.     I have spent a total of 40 minutes with patient reviewing hospital  notes , telemetry, EKGs, labs and examining patient as well as establishing an assessment and plan that was discussed with the patient. > 50% of time was spent in direct patient care.    Thayer Headings, Brooke Bonito., MD, [facility] 07/04/2016, 8:51 AM [address],  [place] Pager [phone]

## 2016-07-04 NOTE — Progress Notes (Signed)
ANTICOAGULATION CONSULT NOTE - Follow Up Consult  Pharmacy Consult for heparin Indication: chest pain/ACS  Allergies  Allergen Reactions  . Penicillins Anaphylaxis    Has patient had a PCN reaction causing immediate rash, facial/tongue/throat swelling, SOB or lightheadedness with hypotension: Yes Has patient had a PCN reaction causing severe rash involving mucus membranes or skin necrosis: No Has patient had a PCN reaction that required hospitalization Yes Has patient had a PCN reaction occurring within the last 10 years: No If all of the above answers are "NO", then may proceed with Cephalosporin use.     Patient Measurements: Height: 4\' 10"  (147.3 cm) Weight: 118 lb 14.4 oz (53.9 kg) IBW/kg (Calculated) : 40.9 Heparin Dosing Weight: 53.9 kg  Vital Signs: Temp: 98.2 F (36.8 C) (07/31 1452) Temp Source: Oral (07/31 1452) BP: 136/65 (07/31 1452) Pulse Rate: 73 (07/31 1452)  Labs:  Recent Labs  07/03/16 1620 07/03/16 2201 07/04/16 0040 07/04/16 0408  HGB 10.6*  --   --   --   HCT 32.0*  --   --   --   PLT 373  --   --   --   LABPROT 11.2*  --   --   --   INR 0.81  --   --   --   CREATININE 0.78  --   --   --   TROPONINI <0.03 <0.03 <0.03 <0.03    Estimated Creatinine Clearance: 44.2 mL/min (by C-G formula based on SCr of 0.8 mg/dL).   Medications:  Scheduled:  . amLODipine  5 mg Oral Daily  . aspirin EC  81 mg Oral Daily  . levothyroxine  50 mcg Oral QAC breakfast  . nitroGLYCERIN  0.5 inch Topical Q6H  . pantoprazole  40 mg Oral Daily  . propranolol  20 mg Oral BID  . rOPINIRole  0.5 mg Oral QHS  . traMADol  50 mg Oral QHS   Infusions:  . [START ON 07/05/2016] sodium chloride     Followed by  . [START ON 07/05/2016] sodium chloride    . heparin 650 Units/hr (07/04/16 1005)    Assessment: 75 yo female with chest pain is currently on therapeutic heparin.  Heparin level is 0.69.  Goal of Therapy:  Heparin level 0.3-0.7 units/ml Monitor platelets by  anticoagulation protocol: Yes   Plan:  - continue heparin at 650 units/hr - 8 hr heparin level to confirm  Danniel Grenz, Tsz-Yin 07/04/2016,4:57 PM

## 2016-07-04 NOTE — Progress Notes (Signed)
PROGRESS NOTE    Katherine Hall  [id number] DOB: [birth year] DOA: 07/03/2016 PCP: No PCP Per Patient   Brief Narrative:  Katherine Hall is a 75 y.o. female with medical history significant of HTN, HLD, extensive family history of CAD.  Patient presents to the ED at [facility] with c/o chest pain.  Symptoms onset this morning with radiation to her throat.  Improved with 2 baby ASA this morning before recurring.  She presents to the ED for evaluation.  Pain has been intermittent since its sudden onset.  Mild SOB.  Currently CP free after NTG patch put on although now she has a migraine headache.  Assessment & Plan:   Principal Problem:   Chest pain Active Problems:   Hypothyroidism   HTN (hypertension)   Hyperlipidemia   #1 chest pain Patient presented with chest pain with multiple risk factors of hypertension hyperlipidemia prior history of cyst removal from the heart with positive family history of coronary artery disease. Cardiac enzymes have been negative 3. Chest pain improved on nitroglycerin and aspirin. 2-D echo pending. Patient has been seen in consultation by cardiology who recommended cardiac catheterization to be done tomorrow. Patient's beta blocker has been resumed. Continue aspirin, Norvasc, PPI, nitroglycerin paste. Cardiology following and appreciate input and recommendations.  #2 hypothyroidism Check a TSH. Continue home dose Synthroid.  #3 hypertension Continue Norvasc. Propranolol has been added to patient's regimen. Follow. If better blood pressure control needed may consider starting patient on ACE inhibitor.  #4 hyperlipidemia Check a fasting lipid panel.   DVT prophylaxis: IV heparin Code Status: DO NOT RESUSCITATE Family Communication: Updated patient. No family at bedside. Disposition Plan: Pending cardiac evaluation and per cardiology.   Consultants:   Cardiology: Dr.Nahser 07/04/2016  Procedures:   Chest x-ray 07/03/2016  Antimicrobials:    None   Subjective: Patient states some improvement with chest pain. No shortness of breath. Patient states some improvement with headache.  Objective: Vitals:   07/03/16 2019 07/03/16 2106 07/04/16 0500 07/04/16 0725  BP: 142/59 (!) 168/66 137/60 116/62  Pulse: 69 66 75   Resp: 16 20    Temp: 98.9 F (37.2 C) 98.3 F (36.8 C) 98.3 F (36.8 C)   TempSrc:  Oral Oral   SpO2: 97% 97% 98%   Weight:  54.7 kg (120 lb 11.2 oz) 53.9 kg (118 lb 14.4 oz)   Height:  4\' 10"  (1.473 m)      Intake/Output Summary (Last 24 hours) at 07/04/16 1339 Last data filed at 07/04/16 0900  Gross per 24 hour  Intake              480 ml  Output                0 ml  Net              480 ml   Filed Weights   07/03/16 1906 07/03/16 2106 07/04/16 0500  Weight: 54.4 kg (120 lb) 54.7 kg (120 lb 11.2 oz) 53.9 kg (118 lb 14.4 oz)    Examination:  General exam: Appears calm and comfortable  Respiratory system: Clear to auscultation. Respiratory effort normal. Cardiovascular system: S1 & S2 heard, RRR. No JVD, murmurs, rubs, gallops or clicks. No pedal edema. Gastrointestinal system: Abdomen is nondistended, soft and nontender. No organomegaly or masses felt. Normal bowel sounds heard. Central nervous system: Alert and oriented. No focal neurological deficits. Extremities: Symmetric 5 x 5 power. Skin: No rashes, lesions or ulcers Psychiatry: Judgement and insight appear  normal. Mood & affect appropriate.     Data Reviewed: I have personally reviewed following labs and imaging studies  CBC:  Recent Labs Lab 07/03/16 1620  WBC 6.7  HGB 10.6*  HCT 32.0*  MCV 92.0  PLT [id number]   Basic Metabolic Panel:  Recent Labs Lab 07/03/16 1620  NA 130*  K 4.0  CL 96*  CO2 25  GLUCOSE 98  BUN 19  CREATININE 0.78  CALCIUM 9.3   GFR: Estimated Creatinine Clearance: 44.2 mL/min (by C-G formula based on SCr of 0.8 mg/dL). Liver Function Tests: No results for input(s): AST, ALT, ALKPHOS, BILITOT,  PROT, ALBUMIN in the last 168 hours. No results for input(s): LIPASE, AMYLASE in the last 168 hours. No results for input(s): AMMONIA in the last 168 hours. Coagulation Profile:  Recent Labs Lab 07/03/16 1620  INR 0.81   Cardiac Enzymes:  Recent Labs Lab 07/03/16 1620 07/03/16 2201 07/04/16 0040 07/04/16 0408  TROPONINI <0.03 <0.03 <0.03 <0.03   BNP (last 3 results) No results for input(s): PROBNP in the last 8760 hours. HbA1C: No results for input(s): HGBA1C in the last 72 hours. CBG: No results for input(s): GLUCAP in the last 168 hours. Lipid Profile: No results for input(s): CHOL, HDL, LDLCALC, TRIG, CHOLHDL, LDLDIRECT in the last 72 hours. Thyroid Function Tests: No results for input(s): TSH, T4TOTAL, FREET4, T3FREE, THYROIDAB in the last 72 hours. Anemia Panel: No results for input(s): VITAMINB12, FOLATE, FERRITIN, TIBC, IRON, RETICCTPCT in the last 72 hours. Sepsis Labs: No results for input(s): PROCALCITON, LATICACIDVEN in the last 168 hours.  No results found for this or any previous visit (from the past 240 hour(s)).       Radiology Studies: Dg Chest 2 View  Result Date: 07/03/2016 CLINICAL DATA:  Chest pain under left breast for 2 days. EXAM: CHEST  2 VIEW COMPARISON:  None. FINDINGS: The heart size is normal. The lungs are clear. The visualized soft tissues and bony thorax are unremarkable. Patient is status post median sternotomy. Atherosclerotic calcifications are present in the aorta. Slight curvature is present of thoraco lumbar spine. Degenerative changes of the shoulder are worse on the right. IMPRESSION: 1. No acute cardiopulmonary disease. Electronically Signed   By: [name] M.D.   On: 07/03/2016 17:30       Scheduled Meds: . amLODipine  5 mg Oral Daily  . aspirin EC  81 mg Oral Daily  . levothyroxine  50 mcg Oral QAC breakfast  . nitroGLYCERIN  0.5 inch Topical Q6H  . pantoprazole  40 mg Oral Daily  . propranolol  20 mg Oral  BID  . rOPINIRole  0.5 mg Oral QHS  . traMADol  50 mg Oral QHS   Continuous Infusions: . [START ON 07/05/2016] sodium chloride     Followed by  . [START ON 07/05/2016] sodium chloride    . heparin 650 Units/hr (07/04/16 1005)     LOS: 0 days    Time spent: 77 mins    THOMPSON,DANIEL, MD [facility] Pager [phone]  If 7PM-7AM, please contact night-coverage [email or web address] Password [email or web address] 07/04/2016, 1:39 PM

## 2016-07-05 ENCOUNTER — Encounter (HOSPITAL_COMMUNITY): Payer: Self-pay | Admitting: Cardiovascular Disease

## 2016-07-05 ENCOUNTER — Observation Stay (HOSPITAL_COMMUNITY): Payer: Medicare Other

## 2016-07-05 ENCOUNTER — Observation Stay (HOSPITAL_BASED_OUTPATIENT_CLINIC_OR_DEPARTMENT_OTHER): Payer: Medicare Other

## 2016-07-05 ENCOUNTER — Encounter (HOSPITAL_COMMUNITY)
Admission: EM | Disposition: A | Discharge status: Home Health | Payer: Self-pay | Source: Home / Self Care | Attending: Surgery

## 2016-07-05 DIAGNOSIS — G43909 Migraine, unspecified, not intractable, without status migrainosus: Secondary | ICD-10-CM | POA: Diagnosis not present

## 2016-07-05 DIAGNOSIS — E871 Hypo-osmolality and hyponatremia: Secondary | ICD-10-CM | POA: Diagnosis present

## 2016-07-05 DIAGNOSIS — E785 Hyperlipidemia, unspecified: Secondary | ICD-10-CM | POA: Diagnosis present

## 2016-07-05 DIAGNOSIS — I200 Unstable angina: Secondary | ICD-10-CM

## 2016-07-05 DIAGNOSIS — J9811 Atelectasis: Secondary | ICD-10-CM | POA: Diagnosis not present

## 2016-07-05 DIAGNOSIS — Z79899 Other long term (current) drug therapy: Secondary | ICD-10-CM | POA: Diagnosis not present

## 2016-07-05 DIAGNOSIS — R079 Chest pain, unspecified: Secondary | ICD-10-CM | POA: Diagnosis present

## 2016-07-05 DIAGNOSIS — E039 Hypothyroidism, unspecified: Secondary | ICD-10-CM | POA: Diagnosis present

## 2016-07-05 DIAGNOSIS — I10 Essential (primary) hypertension: Secondary | ICD-10-CM | POA: Diagnosis present

## 2016-07-05 DIAGNOSIS — Z01810 Encounter for preprocedural cardiovascular examination: Secondary | ICD-10-CM | POA: Diagnosis not present

## 2016-07-05 DIAGNOSIS — Z880 Allergy status to penicillin: Secondary | ICD-10-CM | POA: Diagnosis not present

## 2016-07-05 DIAGNOSIS — Z8249 Family history of ischemic heart disease and other diseases of the circulatory system: Secondary | ICD-10-CM | POA: Diagnosis not present

## 2016-07-05 DIAGNOSIS — Z8711 Personal history of peptic ulcer disease: Secondary | ICD-10-CM | POA: Diagnosis not present

## 2016-07-05 DIAGNOSIS — I358 Other nonrheumatic aortic valve disorders: Secondary | ICD-10-CM | POA: Diagnosis present

## 2016-07-05 DIAGNOSIS — I25110 Atherosclerotic heart disease of native coronary artery with unstable angina pectoris: Principal | ICD-10-CM

## 2016-07-05 DIAGNOSIS — Z90710 Acquired absence of both cervix and uterus: Secondary | ICD-10-CM | POA: Diagnosis not present

## 2016-07-05 DIAGNOSIS — D649 Anemia, unspecified: Secondary | ICD-10-CM

## 2016-07-05 DIAGNOSIS — K5900 Constipation, unspecified: Secondary | ICD-10-CM | POA: Diagnosis not present

## 2016-07-05 DIAGNOSIS — E8770 Fluid overload, unspecified: Secondary | ICD-10-CM | POA: Diagnosis not present

## 2016-07-05 DIAGNOSIS — K259 Gastric ulcer, unspecified as acute or chronic, without hemorrhage or perforation: Secondary | ICD-10-CM | POA: Diagnosis present

## 2016-07-05 DIAGNOSIS — I700 Atherosclerosis of aorta: Secondary | ICD-10-CM | POA: Diagnosis present

## 2016-07-05 DIAGNOSIS — D62 Acute posthemorrhagic anemia: Secondary | ICD-10-CM | POA: Diagnosis not present

## 2016-07-05 DIAGNOSIS — D696 Thrombocytopenia, unspecified: Secondary | ICD-10-CM | POA: Diagnosis not present

## 2016-07-05 DIAGNOSIS — I340 Nonrheumatic mitral (valve) insufficiency: Secondary | ICD-10-CM | POA: Diagnosis present

## 2016-07-05 DIAGNOSIS — Z823 Family history of stroke: Secondary | ICD-10-CM | POA: Diagnosis not present

## 2016-07-05 DIAGNOSIS — Z66 Do not resuscitate: Secondary | ICD-10-CM | POA: Diagnosis present

## 2016-07-05 HISTORY — PX: CARDIAC CATHETERIZATION: SHX172

## 2016-07-05 LAB — PULMONARY FUNCTION TEST
FEF 25-75 Post: 0.9 L/sec
FEF 25-75 Pre: 0.69 L/sec
FEF2575-%Change-Post: 30 %
FEF2575-%Pred-Post: 68 %
FEF2575-%Pred-Pre: 52 %
FEV1-%Change-Post: 6 %
FEV1-%Pred-Post: 98 %
FEV1-%Pred-Pre: 93 %
FEV1-Post: 1.54 L
FEV1-Pre: 1.45 L
FEV1FVC-%Change-Post: 11 %
FEV1FVC-%Pred-Pre: 87 %
FEV6-%Change-Post: 3 %
FEV6-%Pred-Post: 105 %
FEV6-%Pred-Pre: 101 %
FEV6-Post: 2.08 L
FEV6-Pre: 2.01 L
FEV6FVC-%Change-Post: 8 %
FEV6FVC-%Pred-Post: 104 %
FEV6FVC-%Pred-Pre: 96 %
FVC-%Change-Post: -4 %
FVC-%Pred-Post: 100 %
FVC-%Pred-Pre: 105 %
FVC-Post: 2.12 L
FVC-Pre: 2.22 L
Post FEV1/FVC ratio: 73 %
Post FEV6/FVC ratio: 98 %
Pre FEV1/FVC ratio: 65 %
Pre FEV6/FVC Ratio: 90 %

## 2016-07-05 LAB — BASIC METABOLIC PANEL
Anion gap: 6 (ref 5–15)
BUN: 8 mg/dL (ref 6–20)
CO2: 27 mmol/L (ref 22–32)
Calcium: 9.1 mg/dL (ref 8.9–10.3)
Chloride: 102 mmol/L (ref 101–111)
Creatinine, Ser: 0.73 mg/dL (ref 0.44–1.00)
GFR calc Af Amer: >60 mL/min (ref >60)
GFR calc non Af Amer: >60 mL/min (ref >60)
Glucose, Bld: 106 mg/dL — ABNORMAL HIGH (ref 65–99)
Potassium: 3.7 mmol/L (ref 3.5–5.1)
Sodium: 135 mmol/L (ref 135–145)

## 2016-07-05 LAB — CBC
HCT: 32.7 % — ABNORMAL LOW (ref 36.0–46.0)
Hemoglobin: 10.2 g/dL — ABNORMAL LOW (ref 12.0–15.0)
MCH: 29.1 pg (ref 26.0–34.0)
MCHC: 31.2 g/dL (ref 30.0–36.0)
MCV: 93.2 fL (ref 78.0–100.0)
Platelets: 355 10*3/uL (ref 150–400)
RBC: 3.51 MIL/uL — ABNORMAL LOW (ref 3.87–5.11)
RDW: 14.5 % (ref 11.5–15.5)
WBC: 6.1 10*3/uL (ref 4.0–10.5)

## 2016-07-05 LAB — LIPID PANEL
Cholesterol: 313 mg/dL — ABNORMAL HIGH (ref 0–200)
HDL: 59 mg/dL (ref >40)
LDL Cholesterol: 217 mg/dL — ABNORMAL HIGH (ref 0–99)
Total CHOL/HDL Ratio: 5.3 RATIO
Triglycerides: 185 mg/dL — ABNORMAL HIGH (ref <150)
VLDL: 37 mg/dL (ref 0–40)

## 2016-07-05 LAB — HEPATIC FUNCTION PANEL
ALT: 13 U/L — ABNORMAL LOW (ref 14–54)
AST: 13 U/L — ABNORMAL LOW (ref 15–41)
Albumin: 3.5 g/dL (ref 3.5–5.0)
Alkaline Phosphatase: 44 U/L (ref 38–126)
Bilirubin, Direct: <0.1 mg/dL — ABNORMAL LOW (ref 0.1–0.5)
Total Bilirubin: 0.5 mg/dL (ref 0.3–1.2)
Total Protein: 5.5 g/dL — ABNORMAL LOW (ref 6.5–8.1)

## 2016-07-05 LAB — ECHOCARDIOGRAM COMPLETE
Height: 58 in
Weight: 1902.4 oz

## 2016-07-05 LAB — HEPARIN LEVEL (UNFRACTIONATED)
Heparin Unfractionated: 0.43 IU/mL (ref 0.30–0.70)
Heparin Unfractionated: 0.48 IU/mL (ref 0.30–0.70)

## 2016-07-05 LAB — TSH: TSH: 1.259 u[IU]/mL (ref 0.350–4.500)

## 2016-07-05 SURGERY — LEFT HEART CATH AND CORONARY ANGIOGRAPHY

## 2016-07-05 MED ORDER — SIMVASTATIN 20 MG PO TABS: 20.0000 mg | ORAL_TABLET | Freq: Every day | ORAL | Status: DC

## 2016-07-05 MED ORDER — HEPARIN (PORCINE) IN NACL 100-0.45 UNIT/ML-% IJ SOLN
650.0000 [IU]/h | INTRAMUSCULAR | Status: DC
  Administered 2016-07-05: 650 [IU]/h via INTRAVENOUS
  Filled 2016-07-05: qty 250

## 2016-07-05 MED ORDER — MIDAZOLAM HCL 2 MG/2ML IJ SOLN
INTRAMUSCULAR | Status: AC
  Filled 2016-07-05: qty 2

## 2016-07-05 MED ORDER — TRAZODONE HCL 50 MG PO TABS
25.0000 mg | ORAL_TABLET | Freq: Every evening | ORAL | Status: DC | PRN
  Administered 2016-07-05 – 2016-07-12 (×5): 25 mg via ORAL
  Filled 2016-07-05 (×5): qty 1

## 2016-07-05 MED ORDER — ALBUTEROL SULFATE (2.5 MG/3ML) 0.083% IN NEBU
2.5000 mg | INHALATION_SOLUTION | Freq: Once | RESPIRATORY_TRACT | Status: AC
  Administered 2016-07-05: 2.5 mg via RESPIRATORY_TRACT

## 2016-07-05 MED ORDER — HEPARIN SODIUM (PORCINE) 1000 UNIT/ML IJ SOLN
INTRAMUSCULAR | Status: AC
Start: 2016-07-05 — End: 2016-07-05
  Filled 2016-07-05: qty 1

## 2016-07-05 MED ORDER — ATORVASTATIN CALCIUM 80 MG PO TABS: 80.0000 mg | ORAL_TABLET | Freq: Every day | ORAL | Status: DC

## 2016-07-05 MED ORDER — SODIUM CHLORIDE 0.9% FLUSH: 3.0000 mL | Freq: Two times a day (BID) | INTRAVENOUS | Status: DC

## 2016-07-05 MED ORDER — SODIUM CHLORIDE 0.9% FLUSH: 3.0000 mL | INTRAVENOUS | Status: DC | PRN

## 2016-07-05 MED ORDER — IOPAMIDOL (ISOVUE-370) INJECTION 76%
INTRAVENOUS | Status: AC
  Filled 2016-07-05: qty 100

## 2016-07-05 MED ORDER — SODIUM CHLORIDE 0.9 % IV SOLN: 250.0000 mL | INTRAVENOUS | Status: DC | PRN

## 2016-07-05 MED ORDER — FENTANYL CITRATE (PF) 100 MCG/2ML IJ SOLN
INTRAMUSCULAR | Status: DC | PRN
  Administered 2016-07-05: 25 ug via INTRAVENOUS

## 2016-07-05 MED ORDER — VERAPAMIL HCL 2.5 MG/ML IV SOLN
INTRAVENOUS | Status: AC
  Filled 2016-07-05: qty 2

## 2016-07-05 MED ORDER — LIDOCAINE HCL (PF) 1 % IJ SOLN
INTRAMUSCULAR | Status: DC | PRN
  Administered 2016-07-05: 3 mL

## 2016-07-05 MED ORDER — LIDOCAINE HCL (PF) 1 % IJ SOLN
INTRAMUSCULAR | Status: AC
  Filled 2016-07-05: qty 30

## 2016-07-05 MED ORDER — HEPARIN (PORCINE) IN NACL 2-0.9 UNIT/ML-% IJ SOLN
INTRAMUSCULAR | Status: DC | PRN
  Administered 2016-07-05: 1500 mL

## 2016-07-05 MED ORDER — ACYCLOVIR 400 MG PO TABS
400.0000 mg | ORAL_TABLET | Freq: Once | ORAL | Status: AC
  Administered 2016-07-05: 400 mg via ORAL
  Filled 2016-07-05: qty 1

## 2016-07-05 MED ORDER — ASPIRIN 81 MG PO CHEW
CHEWABLE_TABLET | ORAL | Status: AC
  Filled 2016-07-05: qty 1

## 2016-07-05 MED ORDER — SODIUM CHLORIDE 0.9 % IV SOLN: INTRAVENOUS | Status: AC

## 2016-07-05 MED ORDER — HEPARIN (PORCINE) IN NACL 2-0.9 UNIT/ML-% IJ SOLN
INTRAMUSCULAR | Status: AC
  Filled 2016-07-05: qty 1000

## 2016-07-05 MED ORDER — FENTANYL CITRATE (PF) 100 MCG/2ML IJ SOLN
INTRAMUSCULAR | Status: AC
Start: 2016-07-05 — End: 2016-07-05
  Filled 2016-07-05: qty 2

## 2016-07-05 MED ORDER — MIDAZOLAM HCL 2 MG/2ML IJ SOLN
INTRAMUSCULAR | Status: DC | PRN
  Administered 2016-07-05 (×2): 1 mg via INTRAVENOUS

## 2016-07-05 MED ORDER — HEPARIN SODIUM (PORCINE) 1000 UNIT/ML IJ SOLN
INTRAMUSCULAR | Status: DC | PRN
  Administered 2016-07-05: 3000 [IU] via INTRAVENOUS

## 2016-07-05 MED ORDER — IOPAMIDOL (ISOVUE-370) INJECTION 76%
INTRAVENOUS | Status: DC | PRN
  Administered 2016-07-05: 80 mL via INTRA_ARTERIAL

## 2016-07-05 SURGICAL SUPPLY — 13 items
CATH INFINITI 5 FR JL3.5 (CATHETERS) ×2 IMPLANT
CATH INFINITI 5FR ANG PIGTAIL (CATHETERS) ×2 IMPLANT
CATH INFINITI JR4 5F (CATHETERS) ×2 IMPLANT
DEVICE RAD COMP TR BAND LRG (VASCULAR PRODUCTS) ×2 IMPLANT
GLIDESHEATH SLEND SS 6F .021 (SHEATH) ×2 IMPLANT
GUIDEWIRE ANGLED .035X150CM (WIRE) ×2 IMPLANT
KIT HEART LEFT (KITS) ×2 IMPLANT
PACK CARDIAC CATHETERIZATION (CUSTOM PROCEDURE TRAY) ×2 IMPLANT
SYR MEDRAD MARK V 150ML (SYRINGE) ×2 IMPLANT
TRANSDUCER W/STOPCOCK (MISCELLANEOUS) ×2 IMPLANT
TUBING CIL FLEX 10 FLL-RA (TUBING) ×2 IMPLANT
WIRE HI TORQ VERSACORE-J 145CM (WIRE) ×2 IMPLANT
WIRE SAFE-T 1.5MM-J .035X260CM (WIRE) ×2 IMPLANT

## 2016-07-05 NOTE — Progress Notes (Signed)
Patient: Katherine Hall / Admit Date: 07/03/2016 / Date of Encounter: 07/05/2016, 8:32 AM   Subjective: Had a good night. No CP or SOB.    Objective: Telemetry: NSR Physical Exam: Blood pressure (!) 103/48, pulse (!) 58, temperature 98.6 F (37 C), temperature source Oral, resp. rate 20, height 4\' 10"  (1.473 m), weight 118 lb 14.4 oz (53.9 kg), SpO2 94 %. General: Well developed, well nourished WF in no acute distress. Head: Normocephalic, atraumatic, sclera non-icteric, no xanthomas, nares are without discharge. Neck: Negative for carotid bruits. JVP not elevated. Lungs: Clear bilaterally to auscultation without wheezes, rales, or rhonchi. Breathing is unlabored. Heart: RRR S1 S2 without murmurs, rubs, or gallops.  Abdomen: Soft, non-tender, non-distended with normoactive bowel sounds. No rebound/guarding. Extremities: No clubbing or cyanosis. No edema. Distal pedal pulses are 2+ and equal bilaterally. Neuro: Alert and oriented X 3. Moves all extremities spontaneously. Psych:  Responds to questions appropriately with a normal affect.   Intake/Output Summary (Last 24 hours) at 07/05/16 0832 Last data filed at 07/05/16 0615  Gross per 24 hour  Intake          1211.08 ml  Output             1000 ml  Net           211.08 ml    Inpatient Medications:  . amLODipine  5 mg Oral Daily  . aspirin EC  81 mg Oral Daily  . levothyroxine  50 mcg Oral QAC breakfast  . nitroGLYCERIN  0.5 inch Topical Q6H  . pantoprazole  40 mg Oral Daily  . propranolol  20 mg Oral BID  . rOPINIRole  0.5 mg Oral QHS  . simvastatin  20 mg Oral q1800  . traMADol  50 mg Oral QHS   Infusions:  . sodium chloride    . heparin 650 Units/hr (07/04/16 1005)    Labs:  Recent Labs  07/03/16 1620 07/05/16 0353  NA 130* 135  K 4.0 3.7  CL 96* 102  CO2 25 27  GLUCOSE 98 106*  BUN 19 8  CREATININE 0.78 0.73  CALCIUM 9.3 9.1    Recent Labs  07/05/16 0353  AST 13*  ALT 13*  ALKPHOS 44  BILITOT 0.5    PROT 5.5*  ALBUMIN 3.5    Recent Labs  07/03/16 1620 07/05/16 0353  WBC 6.7 6.1  HGB 10.6* 10.2*  HCT 32.0* 32.7*  MCV 92.0 93.2  PLT 373 355    Recent Labs  07/03/16 1620 07/03/16 2201 07/04/16 0040 07/04/16 0408  TROPONINI <0.03 <0.03 <0.03 <0.03   Invalid input(s): POCBNP No results for input(s): HGBA1C in the last 72 hours.   Radiology/Studies:  Dg Chest 2 View  Result Date: 07/03/2016 CLINICAL DATA:  Chest pain under left breast for 2 days. EXAM: CHEST  2 VIEW COMPARISON:  None. FINDINGS: The heart size is normal. The lungs are clear. The visualized soft tissues and bony thorax are unremarkable. Patient is status post median sternotomy. Atherosclerotic calcifications are present in the aorta. Slight curvature is present of thoraco lumbar spine. Degenerative changes of the shoulder are worse on the right. IMPRESSION: 1. No acute cardiopulmonary disease. Electronically Signed   By: [name] M.D.   On: 07/03/2016 17:30    Assessment and Plan   75 y.o. female with history of HTN, HLD (intol of statins, insurance stopped paying for Repatha), arthritis, anemia, stomach ulcer 2009, striking family history of CAD, and remote tennis ball-sized cyst removed  from heart who presented to [facility] with chest pain relieved with SL NTG.    1. Chest pain concerning for [place] - EKG and trops are nonischemic but chest pain features are concerning for [place]. Family hx and HLD are quite striking. Plan cath today. Risks/benefits already discussed with patient.  2. HTN - BP soft at times. Continue to hold ACEI. Add hold parameters to amlodipine. She refused NTG paste overnight.  3. Hyperlipidemia - She has h/o intolerance to statins. If found to have CAD she can likely qualify for Repatha or Praluent. This was previously denied by insurance.  4. Anemia - patient reports this is chronic. She denies any signs of bleeding. Hemoccult pending. No sign drop overnight.  5. Hyponatremia - no  sx of CHF on exam. Resolved.  SignedMelina Copa PA-C Pager: [phone]  Attending Note:   The patient was seen and examined.  Agree with assessment and plan as noted above.  Changes made to the above note as needed.  Patient seen and independently examined with Melina Copa, PA .   We discussed all aspects of the encounter. I agree with the assessment and plan as stated above.  1. CP :  Symptoms are concerning for UAP. For cath today  Strong family hx of CAD, lipids are very high .  Does not tolerate statins - insurance declined Repatha    I have spent a total of 30 minutes with patient reviewing hospital  notes , telemetry, EKGs, labs and examining patient as well as establishing an assessment and plan that was discussed with the patient. > 50% of time was spent in direct patient care.    Thayer Headings, Brooke Bonito., MD, [facility] 07/05/2016, 9:10 AM [address]  [place] Pager [phone]

## 2016-07-05 NOTE — H&P (View-Only) (Signed)
Patient: Katherine Hall / Admit Date: 07/03/2016 / Date of Encounter: 07/05/2016, 8:32 AM   Subjective: Had a good night. No CP or SOB.    Objective: Telemetry: NSR Physical Exam: Blood pressure (!) 103/48, pulse (!) 58, temperature 98.6 F (37 C), temperature source Oral, resp. rate 20, height 4\' 10"  (1.473 m), weight 118 lb 14.4 oz (53.9 kg), SpO2 94 %. General: Well developed, well nourished WF in no acute distress. Head: Normocephalic, atraumatic, sclera non-icteric, no xanthomas, nares are without discharge. Neck: Negative for carotid bruits. JVP not elevated. Lungs: Clear bilaterally to auscultation without wheezes, rales, or rhonchi. Breathing is unlabored. Heart: RRR S1 S2 without murmurs, rubs, or gallops.  Abdomen: Soft, non-tender, non-distended with normoactive bowel sounds. No rebound/guarding. Extremities: No clubbing or cyanosis. No edema. Distal pedal pulses are 2+ and equal bilaterally. Neuro: Alert and oriented X 3. Moves all extremities spontaneously. Psych:  Responds to questions appropriately with a normal affect.   Intake/Output Summary (Last 24 hours) at 07/05/16 0832 Last data filed at 07/05/16 0615  Gross per 24 hour  Intake          1211.08 ml  Output             1000 ml  Net           211.08 ml    Inpatient Medications:  . amLODipine  5 mg Oral Daily  . aspirin EC  81 mg Oral Daily  . levothyroxine  50 mcg Oral QAC breakfast  . nitroGLYCERIN  0.5 inch Topical Q6H  . pantoprazole  40 mg Oral Daily  . propranolol  20 mg Oral BID  . rOPINIRole  0.5 mg Oral QHS  . simvastatin  20 mg Oral q1800  . traMADol  50 mg Oral QHS   Infusions:  . sodium chloride    . heparin 650 Units/hr (07/04/16 1005)    Labs:  Recent Labs  07/03/16 1620 07/05/16 0353  NA 130* 135  K 4.0 3.7  CL 96* 102  CO2 25 27  GLUCOSE 98 106*  BUN 19 8  CREATININE 0.78 0.73  CALCIUM 9.3 9.1    Recent Labs  07/05/16 0353  AST 13*  ALT 13*  ALKPHOS 44  BILITOT 0.5    PROT 5.5*  ALBUMIN 3.5    Recent Labs  07/03/16 1620 07/05/16 0353  WBC 6.7 6.1  HGB 10.6* 10.2*  HCT 32.0* 32.7*  MCV 92.0 93.2  PLT 373 355    Recent Labs  07/03/16 1620 07/03/16 2201 07/04/16 0040 07/04/16 0408  TROPONINI <0.03 <0.03 <0.03 <0.03   Invalid input(s): POCBNP No results for input(s): HGBA1C in the last 72 hours.   Radiology/Studies:  Dg Chest 2 View  Result Date: 07/03/2016 CLINICAL DATA:  Chest pain under left breast for 2 days. EXAM: CHEST  2 VIEW COMPARISON:  None. FINDINGS: The heart size is normal. The lungs are clear. The visualized soft tissues and bony thorax are unremarkable. Patient is status post median sternotomy. Atherosclerotic calcifications are present in the aorta. Slight curvature is present of thoraco lumbar spine. Degenerative changes of the shoulder are worse on the right. IMPRESSION: 1. No acute cardiopulmonary disease. Electronically Signed   By: [name] M.D.   On: 07/03/2016 17:30    Assessment and Plan   75 y.o. 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    1. Chest pain concerning for [place] - EKG and trops are nonischemic but chest pain features are concerning for [place]. Family hx and HLD are quite striking. Plan cath today. Risks/benefits already discussed with patient.  2. HTN - BP soft at times. Continue to hold ACEI. Add hold parameters to amlodipine. She refused NTG paste overnight.  3. Hyperlipidemia - She has h/o intolerance to statins. If found to have CAD she can likely qualify for Repatha or Praluent. This was previously denied by insurance.  4. Anemia - patient reports this is chronic. She denies any signs of bleeding. Hemoccult pending. No sign drop overnight.  5. Hyponatremia - no  sx of CHF on exam. Resolved.  SignedMelina Copa PA-C Pager: [phone]  Attending Note:   The patient was seen and examined.  Agree with assessment and plan as noted above.  Changes made to the above note as needed.  Patient seen and independently examined with Melina Copa, PA .   We discussed all aspects of the encounter. I agree with the assessment and plan as stated above.  1. CP :  Symptoms are concerning for UAP. For cath today  Strong family hx of CAD, lipids are very high .  Does not tolerate statins - insurance declined Repatha    I have spent a total of 30 minutes with patient reviewing hospital  notes , telemetry, EKGs, labs and examining patient as well as establishing an assessment and plan that was discussed with the patient. > 50% of time was spent in direct patient care.    Thayer Headings, Brooke Bonito., MD, [facility] 07/05/2016, 9:10 AM [address],  [place] Pager [phone]

## 2016-07-05 NOTE — Research (Signed)
LEADERS FREE II Informed Consent   Subject Name: Katherine Hall  Subject met inclusion and exclusion criteria.  The informed consent form, study requirements and expectations were reviewed with the subject and questions and concerns were addressed prior to the signing of the consent form.  The subject verbalized understanding of the trail requirements.  The subject agreed to participate in the LEADERS FREE II trial and signed the informed consent.  The informed consent was obtained prior to performance of any protocol-specific procedures for the subject.  A copy of the signed informed consent was given to the subject and a copy was placed in the subject's medical record. Applicable only if BIOFREEDOM Stent implanted.  Hedrick,Marthella Osorno W 07/05/2016, 7:39 AM

## 2016-07-05 NOTE — Progress Notes (Signed)
ANTICOAGULATION CONSULT NOTE - Follow Up Consult  Pharmacy Consult for heparin Indication: chest pain/ACS   Labs:  Recent Labs  07/03/16 1620 07/03/16 2201 07/04/16 0040 07/04/16 0408 07/04/16 1607 07/04/16 2356  HGB 10.6*  --   --   --   --   --   HCT 32.0*  --   --   --   --   --   PLT 373  --   --   --   --   --   LABPROT 11.2*  --   --   --   --   --   INR 0.81  --   --   --   --   --   HEPARINUNFRC  --   --   --   --  0.69 0.43  CREATININE 0.78  --   --   --   --   --   TROPONINI <0.03 <0.03 <0.03 <0.03  --   --      Assessment/Plan:  75yo female remains therapeutic on heparin though trending down. Will continue gtt at current rate and monitor daily level.   Wynona Neat, PharmD, BCPS  07/05/2016,1:02 AM

## 2016-07-05 NOTE — Interval H&P Note (Signed)
History and Physical Interval Note:  07/05/2016 10:10 AM  Katherine Hall  has presented today for cardiac cath with the diagnosis of unstable angina  The various methods of treatment have been discussed with the patient and family. After consideration of risks, benefits and other options for treatment, the patient has consented to  Procedure(s): Left Heart Cath and Coronary Angiography (N/A) as a surgical intervention .  The patient's history has been reviewed, patient examined, no change in status, stable for surgery.  I have reviewed the patient's chart and labs.  Questions were answered to the patient's satisfaction.    Cath Lab Visit (complete for each Cath Lab visit)  Clinical Evaluation Leading to the Procedure:   ACS: No.  Non-ACS:    Anginal Classification: CCS III  Anti-ischemic medical therapy: Minimal Therapy (1 class of medications)  Non-Invasive Test Results: No non-invasive testing performed  Prior CABG: No previous CABG        Lauree Chandler

## 2016-07-05 NOTE — Progress Notes (Signed)
PROGRESS NOTE    Katherine Hall  [id number] DOB: [birth year] DOA: 07/03/2016 PCP: No PCP Per Patient   Brief Narrative:  Katherine Hall is a 75 y.o. female with medical history significant of HTN, HLD, extensive family history of CAD.  Patient presents to the ED at [facility] with c/o chest pain.  Symptoms onset this morning with radiation to her throat.  Improved with 2 baby ASA this morning before recurring.  She presents to the ED for evaluation.  Pain has been intermittent since its sudden onset.  Mild SOB.  Currently CP free after NTG patch put on although now she has a migraine headache.  Assessment & Plan:   Principal Problem:   Unstable angina (HCC) Active Problems:   Hypothyroidism   HTN (hypertension)   Hyperlipidemia   Anemia, unspecified   Hyponatremia   #1 chest pain Patient presented with chest pain with multiple risk factors of hypertension hyperlipidemia prior history of cyst removal from the heart with positive family history of coronary artery disease. Cardiac enzymes have been negative 3. Chest pain improved on nitroglycerin and aspirin. 2-D echo pending. Patient has been seen in consultation by cardiology who recommended cardiac catheterization to be done today 07/05/2016. Patient's beta blocker has been resumed. Continue aspirin, Norvasc, PPI, nitroglycerin paste. Cardiology following and appreciate input and recommendations.  #2 hypothyroidism TSH within normal limits at 1.259. Continue home dose Synthroid.  #3 hypertension Continue Norvasc. Propranolol has been added to patient's regimen. Follow. If better blood pressure control needed may consider starting patient on ACE inhibitor.  #4 hyperlipidemia Fasting lipid panel with a total cholesterol 313, triglycerides of 185, LDL of 217, HDL of 59. Patient states unable to take statins due to intolerance and has tried all the statins. Per patient cardiology was going to see whether the carina for her insurance to  reinstitute her [place]. Will defer to cardiology.    DVT prophylaxis: IV heparin Code Status: DO NOT RESUSCITATE Family Communication: Updated patient and husband at bedside. Disposition Plan: Pending cardiac evaluation and per cardiology.   Consultants:   Cardiology: Dr.Nahser 07/04/2016  Procedures:   Chest x-ray 07/03/2016  Antimicrobials:   None   Subjective: Patient states some chest discomfort yesterday. No chest pain today. No shortness of breath.  Objective: Vitals:   07/04/16 1452 07/04/16 2126 07/05/16 0600 07/05/16 0959  BP: 136/65 (!) 111/50 (!) 103/48   Pulse: 73 68 (!) 58   Resp:      Temp: 98.2 F (36.8 C) 98.5 F (36.9 C) 98.6 F (37 C)   TempSrc: Oral Oral Oral   SpO2: 97% 98% 94% 98%  Weight:   53.9 kg (118 lb 14.4 oz)   Height:        Intake/Output Summary (Last 24 hours) at 07/05/16 1001 Last data filed at 07/05/16 0615  Gross per 24 hour  Intake           971.08 ml  Output             1000 ml  Net           -28.92 ml   Filed Weights   07/03/16 2106 07/04/16 0500 07/05/16 0600  Weight: 54.7 kg (120 lb 11.2 oz) 53.9 kg (118 lb 14.4 oz) 53.9 kg (118 lb 14.4 oz)    Examination:  General exam: Appears calm and comfortable  Respiratory system: Clear to auscultation. Respiratory effort normal. Cardiovascular system: S1 & S2 heard, RRR. No JVD, murmurs, rubs, gallops or clicks. No pedal  edema. Gastrointestinal system: Abdomen is nondistended, soft and nontender. No organomegaly or masses felt. Normal bowel sounds heard. Central nervous system: Alert and oriented. No focal neurological deficits. Extremities: Symmetric 5 x 5 power. Skin: No rashes, lesions or ulcers Psychiatry: Judgement and insight appear normal. Mood & affect appropriate.     Data Reviewed: I have personally reviewed following labs and imaging studies  CBC:  Recent Labs Lab 07/03/16 1620 07/05/16 0353  WBC 6.7 6.1  HGB 10.6* 10.2*  HCT 32.0* 32.7*  MCV 92.0  93.2  PLT 373 [id number]   Basic Metabolic Panel:  Recent Labs Lab 07/03/16 1620 07/05/16 0353  NA 130* 135  K 4.0 3.7  CL 96* 102  CO2 25 27  GLUCOSE 98 106*  BUN 19 8  CREATININE 0.78 0.73  CALCIUM 9.3 9.1   GFR: Estimated Creatinine Clearance: 44.2 mL/min (by C-G formula based on SCr of 0.8 mg/dL). Liver Function Tests:  Recent Labs Lab 07/05/16 0353  AST 13*  ALT 13*  ALKPHOS 44  BILITOT 0.5  PROT 5.5*  ALBUMIN 3.5   No results for input(s): LIPASE, AMYLASE in the last 168 hours. No results for input(s): AMMONIA in the last 168 hours. Coagulation Profile:  Recent Labs Lab 07/03/16 1620  INR 0.81   Cardiac Enzymes:  Recent Labs Lab 07/03/16 1620 07/03/16 2201 07/04/16 0040 07/04/16 0408  TROPONINI <0.03 <0.03 <0.03 <0.03   BNP (last 3 results) No results for input(s): PROBNP in the last 8760 hours. HbA1C: No results for input(s): HGBA1C in the last 72 hours. CBG: No results for input(s): GLUCAP in the last 168 hours. Lipid Profile:  Recent Labs  07/05/16 0353  CHOL 313*  HDL 59  LDLCALC 217*  TRIG 185*  CHOLHDL 5.3   Thyroid Function Tests:  Recent Labs  07/05/16 0353  TSH 1.259   Anemia Panel: No results for input(s): VITAMINB12, FOLATE, FERRITIN, TIBC, IRON, RETICCTPCT in the last 72 hours. Sepsis Labs: No results for input(s): PROCALCITON, LATICACIDVEN in the last 168 hours.  No results found for this or any previous visit (from the past 240 hour(s)).       Radiology Studies: Dg Chest 2 View  Result Date: 07/03/2016 CLINICAL DATA:  Chest pain under left breast for 2 days. EXAM: CHEST  2 VIEW COMPARISON:  None. FINDINGS: The heart size is normal. The lungs are clear. The visualized soft tissues and bony thorax are unremarkable. Patient is status post median sternotomy. Atherosclerotic calcifications are present in the aorta. Slight curvature is present of thoraco lumbar spine. Degenerative changes of the shoulder are worse on the  right. IMPRESSION: 1. No acute cardiopulmonary disease. Electronically Signed   By: [name] M.D.   On: 07/03/2016 17:30       Scheduled Meds: . [MAR Hold] amLODipine  5 mg Oral Daily  . [MAR Hold] aspirin EC  81 mg Oral Daily  . [MAR Hold] levothyroxine  50 mcg Oral QAC breakfast  . [MAR Hold] nitroGLYCERIN  0.5 inch Topical Q6H  . [MAR Hold] pantoprazole  40 mg Oral Daily  . [MAR Hold] propranolol  20 mg Oral BID  . [MAR Hold] rOPINIRole  0.5 mg Oral QHS  . [MAR Hold] simvastatin  20 mg Oral q1800  . [MAR Hold] traMADol  50 mg Oral QHS   Continuous Infusions: . sodium chloride    . heparin 650 Units/hr (07/04/16 1005)     LOS: 0 days    Time spent: 35 mins  Irine Seal, MD [facility] Pager [phone]  If 7PM-7AM, please contact night-coverage [email or web address] Password [email or web address] 07/05/2016, 10:01 AM

## 2016-07-05 NOTE — Progress Notes (Signed)
Echocardiogram 2D Echocardiogram has been performed.  Joelene Millin 07/05/2016, 2:33 PM

## 2016-07-05 NOTE — Consult Note (Signed)
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 Lauree Chandler  Katherine Hall is an 75 y.o. female.  HPI:   She has a 3-6 month history of not feeling well with heartburn-type chest discomfort, exertional fatigue and tiredness and exertional shortness of breath. Lately she has not been able to walk down her inclined 110 ft driveway because she can't make it back up. On Saturday she developed aching and tightness in her chest while watching a ball game. The pain waxed an waned all day. She took and aspirin and went to bed. The next morning in church the pain recurred and she was concerned that it may be her heart so she went to [facility]. Troponin x 3 were negative. ECG was unremarkable. She had a very strong family history and hyperlipidemia with statin intolerance. She was transferred to [facility] and cath this am showed 70% LM and ostial LAD, 90% ostial LCX, 90% mid RCA and other scattered branch stenoses. Her EF is normal. She has relatively small distal vessels.  Past Medical History:  Diagnosis Date  . Anemia   . Arthritis   . Gastric ulcer    a. h/o bleeding ulcer 2009 requiring 3 pints of blood.  [name] History of removal of cyst    a. 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  [name] HLD (hyperlipidemia)    a. h/o intolerance of statins, prev on Repatha but insurance stopped covering.  [name] HTN (hypertension)     Past Surgical History:  Procedure Laterality Date  . ABDOMINAL HYSTERECTOMY    . APPENDECTOMY    . CARDIAC SURGERY     cyst, not on heart.  [name] HEMORRHOID SURGERY    . SHOULDER SURGERY      Family History  Problem Relation Age of Onset  . Stroke Mother     Stroke age 75  . CAD Father     died of MI age 16  . Coronary artery disease Sister     one sister had massive MI at 61, another sister has 2  stents  . Coronary artery disease Brother     one brother had MI at age 67 and died at 51, another has had 4 MIs and 2 bypasses    Social History:  reports that she has never smoked. She has never used smokeless tobacco. She reports that she does not drink alcohol or use drugs.  Allergies:  Allergies  Allergen Reactions  . Penicillins Anaphylaxis    Has patient had a PCN reaction causing immediate rash, facial/tongue/throat swelling, SOB or lightheadedness with hypotension: Yes Has patient had a PCN reaction causing severe rash involving mucus membranes or skin necrosis: No Has patient had a PCN reaction that required hospitalization Yes Has patient had a PCN reaction occurring within the last 10 years: No If all of the above answers are "NO", then may proceed with Cephalosporin use.     Medications:  I have reviewed the patient's current medications. Prior to Admission:  Prescriptions Prior to Admission  Medication Sig Dispense Refill Last Dose  . acyclovir (ZOVIRAX) 400 MG tablet Take 400 mg by mouth 5 (five) times daily.   07/03/2016 at Unknown time  . amLODipine (NORVASC) 5 MG tablet  Take 5 mg by mouth daily.   07/03/2016 at Unknown time  . levothyroxine (SYNTHROID, LEVOTHROID) 50 MCG tablet Take 50 mcg by mouth daily.   07/03/2016 at Unknown time  . lisinopril (PRINIVIL,ZESTRIL) 20 MG tablet Take 20 mg by mouth daily.   07/03/2016 at Unknown time  . pantoprazole (PROTONIX) 40 MG tablet Take 40 mg by mouth daily.   07/03/2016 at Unknown time  . propranolol (INDERAL) 20 MG tablet Take 20 mg by mouth 2 (two) times daily.   07/03/2016 at 0800  . rOPINIRole (REQUIP) 0.5 MG tablet Take 0.5 mg by mouth at bedtime.   07/02/2016  . traMADol (ULTRAM) 50 MG tablet Take 50 mg by mouth 3 (three) times daily as needed for pain.   07/03/2016 at Unknown time   Scheduled: . amLODipine  5 mg Oral Daily  . aspirin EC  81 mg Oral Daily  . atorvastatin  80 mg Oral q1800  . levothyroxine  50 mcg Oral QAC  breakfast  . nitroGLYCERIN  0.5 inch Topical Q6H  . pantoprazole  40 mg Oral Daily  . propranolol  20 mg Oral BID  . rOPINIRole  0.5 mg Oral QHS  . sodium chloride flush  3 mL Intravenous Q12H  . traMADol  50 mg Oral QHS   Continuous: . sodium chloride    . heparin     TKP:TWSFKC chloride, acetaminophen, morphine injection, nitroGLYCERIN, ondansetron (ZOFRAN) IV, prochlorperazine, sodium chloride flush, traMADol Anti-infectives    Start     Dose/Rate Route Frequency Ordered Stop   07/04/16 1030  acyclovir (ZOVIRAX) tablet 400 mg  Status:  Discontinued     400 mg Oral 5 times daily 07/04/16 1013 07/04/16 1031      Results for orders placed or performed during the hospital encounter of 07/03/16 (from the past 48 hour(s))  Basic metabolic panel     Status: Abnormal   Collection Time: 07/03/16  4:20 PM  Result Value Ref Range   Sodium 130 (L) 135 - 145 mmol/L   Potassium 4.0 3.5 - 5.1 mmol/L   Chloride 96 (L) 101 - 111 mmol/L   CO2 25 22 - 32 mmol/L   Glucose, Bld 98 65 - 99 mg/dL   BUN 19 6 - 20 mg/dL   Creatinine, Ser 0.78 0.44 - 1.00 mg/dL   Calcium 9.3 8.9 - 10.3 mg/dL   GFR calc non Af Amer >60 >60 mL/min   GFR calc Af Amer >60 >60 mL/min    Comment: (NOTE) The eGFR has been calculated using the CKD EPI equation. This calculation has not been validated in all clinical situations. eGFR's persistently <60 mL/min signify possible Chronic Kidney Disease.    Anion gap 9 5 - 15  CBC     Status: Abnormal   Collection Time: 07/03/16  4:20 PM  Result Value Ref Range   WBC 6.7 4.0 - 10.5 K/uL   RBC 3.48 (L) 3.87 - 5.11 MIL/uL   Hemoglobin 10.6 (L) 12.0 - 15.0 g/dL   HCT 32.0 (L) 36.0 - 46.0 %   MCV 92.0 78.0 - 100.0 fL   MCH 30.5 26.0 - 34.0 pg   MCHC 33.1 30.0 - 36.0 g/dL   RDW 14.2 11.5 - 15.5 %   Platelets 373 150 - 400 K/uL  Troponin I     Status: None   Collection Time: 07/03/16  4:20 PM  Result Value Ref Range   Troponin I <0.03 <0.03 ng/mL  Protime-INR      Status:  Abnormal   Collection Time: 07/03/16  4:20 PM  Result Value Ref Range   Prothrombin Time 11.2 (L) 11.4 - 15.2 seconds   INR 0.81   Troponin I-serum (0, 3, 6 hours)     Status: None   Collection Time: 07/03/16 10:01 PM  Result Value Ref Range   Troponin I <0.03 <0.03 ng/mL  Troponin I-serum (0, 3, 6 hours)     Status: None   Collection Time: 07/04/16 12:40 AM  Result Value Ref Range   Troponin I <0.03 <0.03 ng/mL  Troponin I-serum (0, 3, 6 hours)     Status: None   Collection Time: 07/04/16  4:08 AM  Result Value Ref Range   Troponin I <0.03 <0.03 ng/mL  Heparin level (unfractionated)     Status: None   Collection Time: 07/04/16  4:07 PM  Result Value Ref Range   Heparin Unfractionated 0.69 0.30 - 0.70 IU/mL    Comment:        IF HEPARIN RESULTS ARE BELOW EXPECTED VALUES, AND PATIENT DOSAGE HAS BEEN CONFIRMED, SUGGEST FOLLOW UP TESTING OF ANTITHROMBIN III LEVELS.   Heparin level (unfractionated)     Status: None   Collection Time: 07/04/16 11:56 PM  Result Value Ref Range   Heparin Unfractionated 0.43 0.30 - 0.70 IU/mL    Comment:        IF HEPARIN RESULTS ARE BELOW EXPECTED VALUES, AND PATIENT DOSAGE HAS BEEN CONFIRMED, SUGGEST FOLLOW UP TESTING OF ANTITHROMBIN III LEVELS.   Basic metabolic panel     Status: Abnormal   Collection Time: 07/05/16  3:53 AM  Result Value Ref Range   Sodium 135 135 - 145 mmol/L   Potassium 3.7 3.5 - 5.1 mmol/L   Chloride 102 101 - 111 mmol/L   CO2 27 22 - 32 mmol/L   Glucose, Bld 106 (H) 65 - 99 mg/dL   BUN 8 6 - 20 mg/dL   Creatinine, Ser 0.73 0.44 - 1.00 mg/dL   Calcium 9.1 8.9 - 10.3 mg/dL   GFR calc non Af Amer >60 >60 mL/min   GFR calc Af Amer >60 >60 mL/min    Comment: (NOTE) The eGFR has been calculated using the CKD EPI equation. This calculation has not been validated in all clinical situations. eGFR's persistently <60 mL/min signify possible Chronic Kidney Disease.    Anion gap 6 5 - 15  CBC     Status: Abnormal    Collection Time: 07/05/16  3:53 AM  Result Value Ref Range   WBC 6.1 4.0 - 10.5 K/uL   RBC 3.51 (L) 3.87 - 5.11 MIL/uL   Hemoglobin 10.2 (L) 12.0 - 15.0 g/dL   HCT 32.7 (L) 36.0 - 46.0 %   MCV 93.2 78.0 - 100.0 fL   MCH 29.1 26.0 - 34.0 pg   MCHC 31.2 30.0 - 36.0 g/dL   RDW 14.5 11.5 - 15.5 %   Platelets 355 150 - 400 K/uL  Lipid panel     Status: Abnormal   Collection Time: 07/05/16  3:53 AM  Result Value Ref Range   Cholesterol 313 (H) 0 - 200 mg/dL   Triglycerides 185 (H) <150 mg/dL   HDL 59 >40 mg/dL   Total CHOL/HDL Ratio 5.3 RATIO   VLDL 37 0 - 40 mg/dL   LDL Cholesterol 217 (H) 0 - 99 mg/dL    Comment:        Total Cholesterol/HDL:CHD Risk Coronary Heart Disease Risk Table  Men   Women  1/2 Average Risk   3.4   3.3  Average Risk       5.0   4.4  2 X Average Risk   9.6   7.1  3 X Average Risk  23.4   11.0        Use the calculated Patient Ratio above and the CHD Risk Table to determine the patient's CHD Risk.        ATP III CLASSIFICATION (LDL):  <100     mg/dL   Optimal  100-129  mg/dL   Near or Above                    Optimal  130-159  mg/dL   Borderline  160-189  mg/dL   High  >190     mg/dL   Very High   Hepatic function panel     Status: Abnormal   Collection Time: 07/05/16  3:53 AM  Result Value Ref Range   Total Protein 5.5 (L) 6.5 - 8.1 g/dL   Albumin 3.5 3.5 - 5.0 g/dL   AST 13 (L) 15 - 41 U/L   ALT 13 (L) 14 - 54 U/L   Alkaline Phosphatase 44 38 - 126 U/L   Total Bilirubin 0.5 0.3 - 1.2 mg/dL   Bilirubin, Direct <0.1 (L) 0.1 - 0.5 mg/dL   Indirect Bilirubin NOT CALCULATED 0.3 - 0.9 mg/dL  Heparin level (unfractionated)     Status: None   Collection Time: 07/05/16  3:53 AM  Result Value Ref Range   Heparin Unfractionated 0.48 0.30 - 0.70 IU/mL    Comment:        IF HEPARIN RESULTS ARE BELOW EXPECTED VALUES, AND PATIENT DOSAGE HAS BEEN CONFIRMED, SUGGEST FOLLOW UP TESTING OF ANTITHROMBIN III LEVELS.   TSH     Status:  None   Collection Time: 07/05/16  3:53 AM  Result Value Ref Range   TSH 1.259 0.350 - 4.500 uIU/mL    Dg Chest 2 View  Result Date: 07/03/2016 CLINICAL DATA:  Chest pain under left breast for 2 days. EXAM: CHEST  2 VIEW COMPARISON:  None. FINDINGS: The heart size is normal. The lungs are clear. The visualized soft tissues and bony thorax are unremarkable. Patient is status post median sternotomy. Atherosclerotic calcifications are present in the aorta. Slight curvature is present of thoraco lumbar spine. Degenerative changes of the shoulder are worse on the right. IMPRESSION: 1. No acute cardiopulmonary disease. Electronically Signed   By: [name] M.D.   On: 07/03/2016 17:30   Review of Systems  Constitutional: Positive for malaise/fatigue.  HENT: Negative.   Eyes: Negative.   Respiratory: Positive for shortness of breath.   Cardiovascular: Positive for chest pain. Negative for palpitations, orthopnea, leg swelling and PND.  Gastrointestinal: Positive for heartburn.  Genitourinary: Negative.   Musculoskeletal: Positive for back pain.  Skin: Negative.   Neurological: Negative.   Endo/Heme/Allergies: Negative.   Psychiatric/Behavioral: Negative.    Blood pressure (!) 159/68, pulse 66, temperature 98.6 F (37 C), temperature source Oral, resp. rate (!) 22, height '4\' 10"'  (1.473 m), weight 53.9 kg (118 lb 14.4 oz), SpO2 100 %. Physical Exam  Constitutional: She is oriented to person, place, and time. She appears well-developed and well-nourished. No distress.  HENT:  Head: Normocephalic and atraumatic.  Mouth/Throat: Oropharynx is clear and moist.  Eyes: EOM are normal. Pupils are equal, round, and reactive to light.  Neck: Normal range of motion. Neck supple.  No JVD present.  Transverse lower anterior neck scar from thyroid surgery  Cardiovascular: Normal rate, regular rhythm, normal heart sounds and intact distal pulses.   No murmur heard. Respiratory: Effort normal and  breath sounds normal. No respiratory distress. She has no wheezes. She has no rales. She exhibits no tenderness.  Old sternotomy scar  GI: Soft. Bowel sounds are normal. She exhibits no distension and no mass. There is no tenderness.  Musculoskeletal: Normal range of motion. She exhibits no edema.  Lymphadenopathy:    She has no cervical adenopathy.  Neurological: She is alert and oriented to person, place, and time. She has normal strength. No cranial nerve deficit or sensory deficit.  Skin: Skin is warm and dry.  Psychiatric: She has a normal mood and affect.   Burnell Blanks, MD (Primary)    Nelva Bush, MD (Assisting)    Procedures   Left Heart Cath and Coronary Angiography  Conclusion     Mid RCA lesion, 90 %stenosed.  Ost RPDA to RPDA lesion, 70 %stenosed.  Ost 1st Mrg lesion, 80 %stenosed.  Ost Cx to Prox Cx lesion, 90 %stenosed.  Ost LM to LM lesion, 70 %stenosed.  Ost 2nd Mrg to 2nd Mrg lesion, 70 %stenosed.  Ost LAD to Prox LAD lesion, 70 %stenosed.  Lat 2nd Mrg lesion, 99 %stenosed.  Mid LAD lesion, 50 %stenosed.  The left ventricular systolic function is normal.  LV end diastolic pressure is normal.  The left ventricular ejection fraction is greater than 65% by visual estimate.   1. Severe triple vessel CAD 2. Severe stenosis involving the left main artery, ostial Circumflex and ostial/proximal LAD. This is best seen in the caudal view.  3. Severe stenosis mid RCA, PDA and obtuse marginal branch of the Circumflex.  4. Normal LV systolic function.  5. 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 Her anatomy and disease is not favorable for PCI. I will ask CT surgery to see her to discuss CABG. She has had prior open chest surgery. 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 Will need to get records of this surgery.    Indications   Unstable angina (HCC) [I20.0 (ICD-10-CM)]    Procedural Details/Technique   Technical Details Indication: 75 yo female with history of HTN, HLD admitted with chest pain. Troponin negative. She has a strong family history of CAD. She has prior cardiac surgery (?mass removal).   Procedure: The risks, benefits, complications, treatment options, and expected outcomes were discussed with the patient. The patient and/or family concurred with the proposed plan, giving informed consent. The patient was brought to the cath lab after IV hydration was begun and oral premedication was given. The patient was further sedated with Versed and Fentanyl. The right wrist was assessed with a modified Allens test which was positive. The right wrist was prepped and draped in a sterile fashion. 1% lidocaine was used for local anesthesia. Using the modified Seldinger access technique, a 5 French sheath was placed in the right radial artery. 3 mg Verapamil was given through the sheath. 3000 units IV heparin was given. Standard diagnostic catheters were used to perform selective coronary angiography. A pigtail catheter was used to perform a left ventricular angiogram. The sheath was removed from the right radial artery and a Terumo hemostasis band was applied at the arteriotomy site on the right wrist.     Estimated blood loss <50 mL. . During this procedure the patient  was administered the following to achieve and maintain moderate conscious sedation: Versed 2 mg, Fentanyl 25 mcg, while the patient's heart rate, blood pressure, and oxygen saturation were continuously monitored. The period of conscious sedation was 36 minutes, of which I was present face-to-face 100% of this time.    Coronary Findings   Dominance: Right  Left Main  Ost LM to LM lesion, 70% stenosed.  Left Anterior Descending  Ost LAD to Prox LAD lesion, 70% stenosed.  Mid LAD lesion, 50% stenosed. The lesion is discrete.  First Diagonal Branch  Vessel is small in size.  Second Diagonal Branch   Vessel is moderate in size.  Left Circumflex  Ost Cx to Prox Cx lesion, 90% stenosed. The lesion is discrete.  First Obtuse Marginal Branch  Vessel is moderate in size.  Ost 1st Mrg lesion, 80% stenosed. The lesion is discrete.  Second Obtuse Marginal Branch  Vessel is moderate in size.  Ost 2nd Mrg to 2nd Mrg lesion, 70% stenosed.  Lateral Second Obtuse Marginal Branch  Lat 2nd Mrg lesion, 99% stenosed. The lesion is discrete.  Right Coronary Artery  Mid RCA lesion, 90% stenosed. The lesion is discrete.  Right Posterior Descending Artery  Ost RPDA to RPDA lesion, 70% stenosed.  Wall Motion              Left Heart   Left Ventricle The left ventricular size is normal. The left ventricular systolic function is normal. LV end diastolic pressure is normal. The left ventricular ejection fraction is greater than 65% by visual estimate. No regional wall motion abnormalities. There is mild to moderate mitral regurgitation.    Coronary Diagrams   Diagnostic Diagram     Implants     No implant documentation for this case.  PACS Images   Show images for Cardiac catheterization   Link to Procedure Log   Procedure Log    Hemo Data   Flowsheet Row Most Recent Value  AO Systolic Pressure 891 mmHg  AO Diastolic Pressure 61 mmHg  AO Mean 93 mmHg  LV Systolic Pressure 694 mmHg  LV Diastolic Pressure 5 mmHg  LV EDP 16 mmHg  Arterial Occlusion Pressure Extended Systolic Pressure 503 mmHg  Arterial Occlusion Pressure Extended Diastolic Pressure 57 mmHg  Arterial Occlusion Pressure Extended Mean Pressure 91 mmHg  Left Ventricular Apex Extended Systolic Pressure 888 mmHg  Left Ventricular Apex Extended Diastolic Pressure 13 mmHg  Left Ventricular Apex Extended EDP Pressure 16 mmHg     Assessment/Plan:  1. HTN  2. Hyperlipidemia intolerant to statins.  3. Severe Left Main and 3-vessel coronary artery disease with unstable angina.  I agree that CABG is the best treatment  for this patient. I have personally reviewed her cath and discussed the results with her and her husband. Her coronary disease is not amenable to PCI. I discussed the operative procedure with the patient and her husband including alternatives, benefits and risks; including but not limited to bleeding, blood transfusion, infection, stroke, myocardial infarction, graft failure, heart block requiring a permanent pacemaker, organ dysfunction, and death.  [name] understands and agrees to proceed.  We will schedule surgery for Thursday.  I spent 80 minutes performing this consultation and > 50% of this time was spent face to face counseling and coordinating the care of this patient's severe coronary artery disease.  Gaye Pollack 07/05/2016, 12:54 PM

## 2016-07-05 NOTE — Progress Notes (Signed)
ANTICOAGULATION CONSULT NOTE - Follow Up Consult  Pharmacy Consult for heparin  Indication: chest pain/ACS  Allergies  Allergen Reactions  . Penicillins Anaphylaxis    Has patient had a PCN reaction causing immediate rash, facial/tongue/throat swelling, SOB or lightheadedness with hypotension: Yes Has patient had a PCN reaction causing severe rash involving mucus membranes or skin necrosis: No Has patient had a PCN reaction that required hospitalization Yes Has patient had a PCN reaction occurring within the last 10 years: No If all of the above answers are "NO", then may proceed with Cephalosporin use.     Patient Measurements: Height: 4\' 10"  (147.3 cm) Weight: 118 lb 14.4 oz (53.9 kg) IBW/kg (Calculated) : 40.9 Heparin Dosing Weight: 53.9 kg  Vital Signs: Temp: 98.6 F (37 C) (08/01 0600) Temp Source: Oral (08/01 0600) BP: 159/68 (08/01 1048) Pulse Rate: 66 (08/01 1053)  Labs:  Recent Labs  07/03/16 1620 07/03/16 2201 07/04/16 0040 07/04/16 0408 07/04/16 1607 07/04/16 2356 07/05/16 0353  HGB 10.6*  --   --   --   --   --  10.2*  HCT 32.0*  --   --   --   --   --  32.7*  PLT 373  --   --   --   --   --  355  LABPROT 11.2*  --   --   --   --   --   --   INR 0.81  --   --   --   --   --   --   HEPARINUNFRC  --   --   --   --  0.69 0.43 0.48  CREATININE 0.78  --   --   --   --   --  0.73  TROPONINI <0.03 <0.03 <0.03 <0.03  --   --   --     Estimated Creatinine Clearance: 44.2 mL/min (by C-G formula based on SCr of 0.8 mg/dL).   Assessment: 75 yo female scheduled to restart heparin following cardiac cath earlier today. The sheath was removed at 10:59am. Prior to he procedure patient was therapeutic x3 on heparin 650 units/hour.  Goal of Therapy:  Heparin level 0.3-0.7 units/ml Monitor platelets by anticoagulation protocol: Yes   Plan:  Heparin 650 units/hour to start 8/1 @ 1900 F/u 0100 HL 8/2  Georga Bora, PharmD Pager: [phone] 07/05/2016,11:41 AM

## 2016-07-06 ENCOUNTER — Inpatient Hospital Stay (HOSPITAL_COMMUNITY): Payer: Medicare Other

## 2016-07-06 DIAGNOSIS — Z01810 Encounter for preprocedural cardiovascular examination: Secondary | ICD-10-CM

## 2016-07-06 LAB — URINALYSIS, ROUTINE W REFLEX MICROSCOPIC
Bilirubin Urine: NEGATIVE
Glucose, UA: NEGATIVE mg/dL
Hgb urine dipstick: NEGATIVE
Ketones, ur: NEGATIVE mg/dL
Leukocytes, UA: NEGATIVE
Nitrite: NEGATIVE
Protein, ur: NEGATIVE mg/dL
Specific Gravity, Urine: 1.014 (ref 1.005–1.030)
pH: 7 (ref 5.0–8.0)

## 2016-07-06 LAB — SURGICAL PCR SCREEN
MRSA, PCR: NEGATIVE
Staphylococcus aureus: NEGATIVE

## 2016-07-06 LAB — VAS US DOPPLER PRE CABG
LEFT ECA DIAS: -8 cm/s
LEFT VERTEBRAL DIAS: 22 cm/s
Left CCA dist dias: -22 cm/s
Left CCA dist sys: -84 cm/s
Left CCA prox dias: 22 cm/s
Left CCA prox sys: 85 cm/s
Left ICA dist dias: -32 cm/s
Left ICA dist sys: -110 cm/s
Left ICA prox dias: -29 cm/s
Left ICA prox sys: -96 cm/s
RIGHT ECA DIAS: 9 cm/s
RIGHT VERTEBRAL DIAS: 15 cm/s
Right CCA prox dias: 15 cm/s
Right CCA prox sys: 81 cm/s
Right cca dist sys: -94 cm/s

## 2016-07-06 LAB — CBC
HCT: 33.4 % — ABNORMAL LOW (ref 36.0–46.0)
Hemoglobin: 10.8 g/dL — ABNORMAL LOW (ref 12.0–15.0)
MCH: 30.2 pg (ref 26.0–34.0)
MCHC: 32.3 g/dL (ref 30.0–36.0)
MCV: 93.3 fL (ref 78.0–100.0)
Platelets: 349 10*3/uL (ref 150–400)
RBC: 3.58 MIL/uL — ABNORMAL LOW (ref 3.87–5.11)
RDW: 14.3 % (ref 11.5–15.5)
WBC: 7.5 10*3/uL (ref 4.0–10.5)

## 2016-07-06 LAB — BASIC METABOLIC PANEL
Anion gap: 5 (ref 5–15)
BUN: <5 mg/dL — ABNORMAL LOW (ref 6–20)
CO2: 27 mmol/L (ref 22–32)
Calcium: 9.3 mg/dL (ref 8.9–10.3)
Chloride: 105 mmol/L (ref 101–111)
Creatinine, Ser: 0.61 mg/dL (ref 0.44–1.00)
GFR calc Af Amer: >60 mL/min (ref >60)
GFR calc non Af Amer: >60 mL/min (ref >60)
Glucose, Bld: 102 mg/dL — ABNORMAL HIGH (ref 65–99)
Potassium: 4 mmol/L (ref 3.5–5.1)
Sodium: 137 mmol/L (ref 135–145)

## 2016-07-06 LAB — ABO/RH: ABO/RH(D): A POS

## 2016-07-06 LAB — HEPARIN LEVEL (UNFRACTIONATED)
Heparin Unfractionated: 0.38 IU/mL (ref 0.30–0.70)
Heparin Unfractionated: 0.36 IU/mL (ref 0.30–0.70)

## 2016-07-06 LAB — MAGNESIUM: Magnesium: 2.1 mg/dL (ref 1.7–2.4)

## 2016-07-06 MED ORDER — POTASSIUM CHLORIDE 2 MEQ/ML IV SOLN
80.0000 meq | INTRAVENOUS | Status: DC
  Filled 2016-07-06: qty 40

## 2016-07-06 MED ORDER — NITROGLYCERIN IN D5W 200-5 MCG/ML-% IV SOLN
2.0000 ug/min | INTRAVENOUS | Status: DC
  Filled 2016-07-06: qty 250

## 2016-07-06 MED ORDER — DEXMEDETOMIDINE HCL IN NACL 400 MCG/100ML IV SOLN
0.1000 ug/kg/h | INTRAVENOUS | Status: DC
  Filled 2016-07-06: qty 100

## 2016-07-06 MED ORDER — PLASMA-LYTE 148 IV SOLN
INTRAVENOUS | Status: DC
  Filled 2016-07-06: qty 2.5

## 2016-07-06 MED ORDER — CHLORHEXIDINE GLUCONATE CLOTH 2 % EX PADS
6.0000 | MEDICATED_PAD | Freq: Once | CUTANEOUS | Status: AC
  Administered 2016-07-07: 6 via TOPICAL

## 2016-07-06 MED ORDER — DOPAMINE-DEXTROSE 3.2-5 MG/ML-% IV SOLN
0.0000 ug/kg/min | INTRAVENOUS | Status: DC
  Filled 2016-07-06: qty 250

## 2016-07-06 MED ORDER — SODIUM BICARBONATE 8.4 % IV SOLN
INTRAVENOUS | Status: DC
  Filled 2016-07-06: qty 2.5

## 2016-07-06 MED ORDER — SODIUM CHLORIDE 0.9 % IV SOLN
INTRAVENOUS | Status: DC
  Filled 2016-07-06: qty 2.5

## 2016-07-06 MED ORDER — METOPROLOL TARTRATE 12.5 MG HALF TABLET
12.5000 mg | ORAL_TABLET | Freq: Once | ORAL | Status: AC
  Administered 2016-07-07: 12.5 mg via ORAL
  Filled 2016-07-06: qty 1

## 2016-07-06 MED ORDER — VANCOMYCIN HCL IN DEXTROSE 1-5 GM/200ML-% IV SOLN
1000.0000 mg | INTRAVENOUS | Status: DC
  Filled 2016-07-06 (×2): qty 200

## 2016-07-06 MED ORDER — DEXTROSE 5 % IV SOLN
30.0000 ug/min | INTRAVENOUS | Status: DC
  Filled 2016-07-06: qty 2

## 2016-07-06 MED ORDER — ALPRAZOLAM 0.25 MG PO TABS: 0.2500 mg | ORAL_TABLET | ORAL | Status: DC | PRN

## 2016-07-06 MED ORDER — LEVOFLOXACIN IN D5W 500 MG/100ML IV SOLN
500.0000 mg | INTRAVENOUS | Status: DC
  Filled 2016-07-06 (×2): qty 100

## 2016-07-06 MED ORDER — SODIUM CHLORIDE 0.9 % IV SOLN
INTRAVENOUS | Status: DC
  Filled 2016-07-06: qty 30

## 2016-07-06 MED ORDER — SODIUM CHLORIDE 0.9 % IV SOLN
INTRAVENOUS | Status: DC
  Filled 2016-07-06: qty 40

## 2016-07-06 MED ORDER — CHLORHEXIDINE GLUCONATE CLOTH 2 % EX PADS
6.0000 | MEDICATED_PAD | Freq: Once | CUTANEOUS | Status: AC
  Administered 2016-07-06: 6 via TOPICAL

## 2016-07-06 MED ORDER — MAGNESIUM SULFATE 50 % IJ SOLN
40.0000 meq | INTRAMUSCULAR | Status: DC
  Filled 2016-07-06: qty 10

## 2016-07-06 MED ORDER — TEMAZEPAM 15 MG PO CAPS
15.0000 mg | ORAL_CAPSULE | Freq: Once | ORAL | Status: AC | PRN
  Administered 2016-07-06: 15 mg via ORAL
  Filled 2016-07-06: qty 1

## 2016-07-06 MED ORDER — EPINEPHRINE HCL 1 MG/ML IJ SOLN
0.0000 ug/min | INTRAVENOUS | Status: DC
  Filled 2016-07-06: qty 4

## 2016-07-06 MED ORDER — DIAZEPAM 2 MG PO TABS
2.0000 mg | ORAL_TABLET | Freq: Once | ORAL | Status: AC
  Administered 2016-07-07: 2 mg via ORAL
  Filled 2016-07-06: qty 1

## 2016-07-06 MED ORDER — CHLORHEXIDINE GLUCONATE 0.12 % MT SOLN
15.0000 mL | Freq: Once | OROMUCOSAL | Status: AC
  Administered 2016-07-07: 15 mL via OROMUCOSAL
  Filled 2016-07-06: qty 15

## 2016-07-06 MED FILL — Verapamil HCl IV Soln 2.5 MG/ML: INTRAVENOUS | Qty: 2 | Status: AC

## 2016-07-06 NOTE — Progress Notes (Signed)
ANTICOAGULATION CONSULT NOTE - Follow Up Consult  Pharmacy Consult for Heparin  Indication: chest pain/ACS  Patient Measurements: Height: 4\' 10"  (147.3 cm) Weight: 119 lb 12.8 oz (54.3 kg) IBW/kg (Calculated) : 40.9 Vital Signs: Temp: 98.6 F (37 C) (08/02 0500) Temp Source: Oral (08/02 0500) BP: 103/57 (08/02 0500) Pulse Rate: 63 (08/02 0500)  Labs:  Recent Labs  07/03/16 1620 07/03/16 2201 07/04/16 0040 07/04/16 0408  07/05/16 0353 07/06/16 0204 07/06/16 1144  HGB 10.6*  --   --   --   --  10.2* 10.8*  --   HCT 32.0*  --   --   --   --  32.7* 33.4*  --   PLT 373  --   --   --   --  355 349  --   LABPROT 11.2*  --   --   --   --   --   --   --   INR 0.81  --   --   --   --   --   --   --   HEPARINUNFRC  --   --   --   --   < > 0.48 0.38 0.36  CREATININE 0.78  --   --   --   --  0.73 0.61  --   TROPONINI <0.03 <0.03 <0.03 <0.03  --   --   --   --   < > = values in this interval not displayed.  Estimated Creatinine Clearance: 44.4 mL/min (by C-G formula based on SCr of 0.8 mg/dL).  Assessment: Heparin for ACS s/p cath 8/1. HL remains therapeutic today at 0.36. Hgb low but stable - currently 10.8. Plt WNL. No overt bleeding noted.   Goal of Therapy:  Heparin level 0.3-0.7 units/ml Monitor platelets by anticoagulation protocol: Yes   Plan:  -Cont heparin at 650 units/hr -Daily CBC/HL -Monitor s/sx bleeding -For CABG 8/3  Stephens November, PharmD Clinical Pharmacist 1:34 PM, 07/06/2016

## 2016-07-06 NOTE — Progress Notes (Addendum)
Patient Name: Katherine Hall Date of Encounter: 07/06/2016   SUBJECTIVE   No chest pain, sob or palpitations. For CABG tomorrow.   CURRENT MEDS . amLODipine  5 mg Oral Daily  . aspirin EC  81 mg Oral Daily  . levothyroxine  50 mcg Oral QAC breakfast  . pantoprazole  40 mg Oral Daily  . propranolol  20 mg Oral BID  . rOPINIRole  0.5 mg Oral QHS  . sodium chloride flush  3 mL Intravenous Q12H  . traMADol  50 mg Oral QHS    OBJECTIVE  Vitals:   07/05/16 1443 07/05/16 1511 07/05/16 2037 07/06/16 0500  BP:  (!) 113/50 (!) 147/60 (!) 103/57  Pulse:   73 63  Resp:   18 18  Temp: 98.5 F (36.9 C)  99 F (37.2 C) 98.6 F (37 C)  TempSrc: Oral  Oral Oral  SpO2:   95% 96%  Weight:    119 lb 12.8 oz (54.3 kg)  Height:        Intake/Output Summary (Last 24 hours) at 07/06/16 0843 Last data filed at 07/05/16 2300  Gross per 24 hour  Intake              240 ml  Output             1200 ml  Net             -960 ml   Filed Weights   07/04/16 0500 07/05/16 0600 07/06/16 0500  Weight: 118 lb 14.4 oz (53.9 kg) 118 lb 14.4 oz (53.9 kg) 119 lb 12.8 oz (54.3 kg)    PHYSICAL EXAM  General: Pleasant, NAD. Neuro: Alert and oriented X 3. Moves all extremities spontaneously. Psych: Normal affect. HEENT:  Normal  Neck: Supple without bruits or JVD. Lungs:  Resp regular and unlabored, CTA. Heart: RRR no s3, s4, or murmurs. Abdomen: Soft, non-tender, non-distended, BS + x 4.  Extremities: No clubbing, cyanosis or edema. DP/PT/Radials 2+ and equal bilaterally. R radial cath site without hematoma.   Accessory Clinical Findings  CBC  Recent Labs  07/05/16 0353 07/06/16 0204  WBC 6.1 7.5  HGB 10.2* 10.8*  HCT 32.7* 33.4*  MCV 93.2 93.3  PLT 355 0000000   Basic Metabolic Panel  Recent Labs  07/05/16 0353 07/06/16 0204  NA 135 137  K 3.7 4.0  CL 102 105  CO2 27 27  GLUCOSE 106* 102*  BUN 8 <5*  CREATININE 0.73 0.61  CALCIUM 9.1 9.3  MG  --  2.1   Liver Function  Tests  Recent Labs  07/05/16 0353  AST 13*  ALT 13*  ALKPHOS 44  BILITOT 0.5  PROT 5.5*  ALBUMIN 3.5   No results for input(s): LIPASE, AMYLASE in the last 72 hours. Cardiac Enzymes  Recent Labs  07/03/16 2201 07/04/16 0040 07/04/16 0408  TROPONINI <0.03 <0.03 <0.03   BNP Invalid input(s): POCBNP D-Dimer No results for input(s): DDIMER in the last 72 hours. Hemoglobin A1C No results for input(s): HGBA1C in the last 72 hours. Fasting Lipid Panel  Recent Labs  07/05/16 0353  CHOL 313*  HDL 59  LDLCALC 217*  TRIG 185*  CHOLHDL 5.3   Thyroid Function Tests  Recent Labs  07/05/16 0353  TSH 1.259    TELE  Sinus rhythm at rate of 70s  Procedures   Left Heart Cath and Coronary Angiography  Conclusion     Mid RCA lesion, 90 %stenosed.  Ost RPDA to RPDA  lesion, 70 %stenosed.  Ost 1st Mrg lesion, 80 %stenosed.  Ost Cx to Prox Cx lesion, 90 %stenosed.  Ost LM to LM lesion, 70 %stenosed.  Ost 2nd Mrg to 2nd Mrg lesion, 70 %stenosed.  Ost LAD to Prox LAD lesion, 70 %stenosed.  Lat 2nd Mrg lesion, 99 %stenosed.  Mid LAD lesion, 50 %stenosed.  The left ventricular systolic function is normal.  LV end diastolic pressure is normal.  The left ventricular ejection fraction is greater than 65% by visual estimate.   1. Severe triple vessel CAD 2. Severe stenosis involving the left main artery, ostial Circumflex and ostial/proximal LAD. This is best seen in the caudal view.  3. Severe stenosis mid RCA, PDA and obtuse marginal branch of the Circumflex.  4. Normal LV systolic function.  5. 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 Her anatomy and disease is not favorable for PCI. I will ask CT surgery to see her to discuss CABG. She has had prior open chest surgery. 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 Will need to get records of this surgery.       Echo 07/05/16 LV EF: 60% -    65%  ------------------------------------------------------------------- Indications:      Chest pain 786.51.  ------------------------------------------------------------------- History:   Risk factors:  Hypertension. Dyslipidemia.  ------------------------------------------------------------------- Study Conclusions  - Left ventricle: The cavity size was normal. Wall thickness was   normal. Systolic function was normal. The estimated ejection   fraction was in the range of 60% to 65%. LV mid-ventricle false   tendon. Wall motion was normal; there were no regional wall   motion abnormalities. Doppler parameters are consistent with   abnormal left ventricular relaxation (grade 1 diastolic   dysfunction). The E/e&' ratio is between 8-15, suggesting   indeterminate LV filling pressure. - Aortic valve: Sclerosis without stenosis. There was mild   regurgitation. - Mitral valve: Mildly thickened leaflets . There was mild   regurgitation. - Left atrium: The atrium was normal in size. - Inferior vena cava: The vessel was normal in size. The   respirophasic diameter changes were in the normal range (>= 50%),   consistent with normal central venous pressure.  Impressions:  - LVEF 60-65%, normal wall thickness, normal wall motion, diastolic   dysfunction, indeterminate LV filling pressure, aortic valve   sclerosis with mild AI, mild MR, normal LA size, normal IVC.  Radiology/Studies  Dg Chest 2 View  Result Date: 07/03/2016 CLINICAL DATA:  Chest pain under left breast for 2 days. EXAM: CHEST  2 VIEW COMPARISON:  None. FINDINGS: The heart size is normal. The lungs are clear. The visualized soft tissues and bony thorax are unremarkable. Patient is status post median sternotomy. Atherosclerotic calcifications are present in the aorta. Slight curvature is present of thoraco lumbar spine. Degenerative changes of the shoulder are worse on the right. IMPRESSION: 1. No acute cardiopulmonary  disease. Electronically Signed   By: [name] M.D.   On: 07/03/2016 17:30   ASSESSMENT AND PLAN  1. Unstable angina (HCC) - Cath showed severe 3 vessels disease. Echo showed LV EF of 60-65%, normal WM, grade 1 diastolic dysfunction, indeterminate LV filling pressure, aortic valve sclerosis with mild AI, mild MR, normal LA size, normal IVC Seen by CTCA and plan for CABG tomorrow. Continue heparin.   2. HL -07/05/2016: Cholesterol 313; HDL 59; LDL Cholesterol 217; Triglycerides 185; VLDL 37  - Does not tolerated statins - insurance  declined Repatha     Signed, Bhagat,Bhavinkumar PA-C Pager [phone]   Attending Note:   The patient was seen and examined.  Agree with assessment and plan as noted above.  Changes made to the above note as needed.  Patient seen and independently examined with Robbie Lis, PA .   We discussed all aspects of the encounter. I agree with the assessment and plan as stated above.  Katherine Hall is found to have severe 3 V CAD.   I personally reviewed her cardiac cath angiograms.   Will need CABG - is scheduled for Thursday   Intolerant to statins.   Will need Repatha        I have spent a total of 40 minutes with patient reviewing hospital  notes , telemetry, EKGs, labs and examining patient as well as establishing an assessment and plan that was discussed with the patient. > 50% of time was spent in direct patient care.    Thayer Headings, Brooke Bonito., MD, [facility] 07/06/2016, 10:30 AM [address],  [place] Pager [phone]

## 2016-07-06 NOTE — Progress Notes (Signed)
CARDIAC REHAB PHASE I   PRE:  Rate/Rhythm: 34 SR  BP:  Sitting: 127/66        SaO2: 98 RA  MODE:  Ambulation: 350 ft   POST:  Rate/Rhythm: 77 SR  BP:  Sitting: 147/71          SaO2: 98 RA  Pt very eager to ambulate, states she ambulated yesterday in hall with husband. Pt ambulated 350 ft on RA, IV, assist x1, mildly unsteady gait at times, tolerated fairly well. Pt had no specific complaints, did report some fatigue with distance. Cardiac surgery pre-op education completed with pt and husband at bedside. Reviewed IS, sternal precautions, activity progression, cardiac surgery booklet and cardiac surgery guidelines. Left instructions to view cardiac surgery videos. Pt verbalized understanding. Pt lives with her husband, who will be available to provide 24hr care for pt after surgery. Pt also states she has a rolling walker/shower chair at home. Pt to bed per pt request after walk, call bell within reach. Will follow post-op.   [id number] Lenna Sciara, RN, BSN 07/06/2016 11:36 AM

## 2016-07-06 NOTE — Care Management Note (Signed)
Case Management Note  Patient Details  Name: Katherine Hall MRN: [id number] Date of Birth: [birth year]  Subjective/Objective:  Pt presented for chest pain. S/P Cardiac cath 8/1 showed severe triple-vessel CAD - Cardiothoracic surgery consulted, plan is for CABG on 07/07/16, continue heparin for now.                    Action/Plan: Pt is from home with support of husband. Pt has DME: RW and shower chair. CM will continue to monitor post procedure for home needs.   Expected Discharge Date:                  Expected Discharge Plan:  [place]  In-House Referral:     Discharge planning Services  CM Consult  Post Acute Care Choice:    Choice offered to:     DME Arranged:    DME Agency:     HH Arranged:    [place] Agency:     Status of Service:  In process, will continue to follow  If discussed at Long Length of Stay Meetings, dates discussed:    Additional Comments:  Bethena Roys, RN 07/06/2016, 11:37 AM

## 2016-07-06 NOTE — Progress Notes (Signed)
Pre-op Cardiac Surgery  Carotid Findings:  Bilateral: No significant (1-39%) ICA stenosis. Antegrade vertebral flow.    Upper Extremity Right Left  Brachial Pressures 132 142  Radial Waveforms Tri Tri  Ulnar Waveforms Tri Tri  Palmar Arch (Allen's Test) Normal Normal   Findings:  Pedal artery waveforms within normal limits.  Landry Mellow, RDMS, RVT 07/06/2016

## 2016-07-06 NOTE — Progress Notes (Signed)
[facility]                                                                              Patient Demographics  Katherine Hall, is a 75 y.o. female, DOB - [birth year], [id number]  Admit date - 07/03/2016   Admitting Physician Reubin Milan, MD  Outpatient Primary MD for the patient is No PCP Per Patient  Outpatient specialists:   LOS - 1  days    Chief Complaint  Patient presents with  . Chest Pain       Brief summary  Katherine Hesteris a 74 y.o.femalewith medical history significant of HTN, HLD, extensive family history of CAD. Patient presents to the ED at [facility] with c/o chest pain. Symptoms onset on the morning of admission with radiation to her throat. Improved with 2 baby ASA. She presented to the ED for evaluation.     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 Cardiac enzymes have been negative 3. Chest pain improved on nitroglycerin and aspirin.  - 2-D echo showed EF of 60-65% with normal wall motion and diastolic dysfunction - Cardiac cath 8/1 showed severe triple-vessel CAD - Cardiothoracic surgery consulted, who planned CABG on 07/07/16, continue heparin  hypothyroidism TSH within normal limits at 1.259. Continue home dose Synthroid.   hypertension Continue Norvasc, Propranolol   hyperlipidemia Fasting lipid panel with a total cholesterol 313, triglycerides of 185, LDL of 217, HDL of 59. Has not been tolerant of statins and insurance had declined repatha.   Code Status: FC DVT Prophylaxis:  heparin  Family Communication: Discussed in detail with the patient, all imaging results, lab results explained to the patient   Disposition Plan:   Time Spent in minutes   25  minutes  Procedures:  2-D echo Cardiac cath  Consultants:   Cardiology  Cardiac thoracic surgery  Antimicrobials:      Medications  Scheduled Meds: . amLODipine  5 mg Oral Daily  . aspirin EC  81 mg Oral Daily  . levothyroxine  50 mcg Oral QAC breakfast  . pantoprazole  40 mg Oral Daily  . propranolol  20 mg Oral BID  . rOPINIRole  0.5 mg Oral QHS  . sodium chloride flush  3 mL Intravenous Q12H  . traMADol  50 mg Oral QHS   Continuous Infusions: . heparin 650 Units/hr (07/05/16 1900)   PRN Meds:.sodium chloride, acetaminophen, morphine injection, nitroGLYCERIN, ondansetron (ZOFRAN) IV, prochlorperazine, sodium chloride flush, traMADol, traZODone   Antibiotics   Anti-infectives    Start     Dose/Rate Route Frequency Ordered Stop   07/05/16 2330  acyclovir (ZOVIRAX) tablet 400 mg     400 mg Oral Once 07/05/16 2315 07/05/16 2345   07/04/16 1030  acyclovir (ZOVIRAX) tablet 400 mg  Status:  Discontinued     400 mg Oral 5 times daily 07/04/16 1013 07/04/16 1031        Subjective:  Blessed Carringer was seen and examined today.  Patient denies dizziness, chest pain, shortness of breath, abdominal pain, N/V/D/C, new weakness, numbess, tingling. No acute events overnight.    Objective:   Vitals:   07/05/16 1443 07/05/16 1511 07/05/16 2037 07/06/16 0500  BP:  (!) 113/50 (!) 147/60 (!) 103/57  Pulse:   73 63  Resp:   18 18  Temp: 98.5 F (36.9 C)  99 F (37.2 C) 98.6 F (37 C)  TempSrc: Oral  Oral Oral  SpO2:   95% 96%  Weight:    54.3 kg (119 lb 12.8 oz)  Height:        Intake/Output Summary (Last 24 hours) at 07/06/16 1040 Last data filed at 07/05/16 2300  Gross per 24 hour  Intake              240 ml  Output             1200 ml  Net             -960 ml     Wt Readings from Last 3 Encounters:  07/06/16 54.3 kg (119 lb 12.8 oz)     Exam  General: Alert and oriented x 3, NAD  HEENT:  PERRLA, EOMI, Anicteric Sclera, mucous membranes moist.    Neck: Supple, no JVD, no masses  Cardiovascular: S1 S2 auscultated, no rubs, murmurs or gallops. Regular rate and rhythm.  Respiratory: Clear to auscultation bilaterally, no wheezing, rales or rhonchi  Gastrointestinal: Soft, nontender, nondistended, + bowel sounds  Ext: no cyanosis clubbing or edema  Neuro: AAOx3, Cr N's II- XII. Strength 5/5 upper and lower extremities bilaterally  Skin: No rashes  Psych: Normal affect and demeanor, alert and oriented x3    Data Reviewed:  I have personally reviewed following labs and imaging studies  Micro Results No results found for this or any previous visit (from the past 240 hour(s)).  Radiology Reports Dg Chest 2 View  Result Date: 07/03/2016 CLINICAL DATA:  Chest pain under left breast for 2 days. EXAM: CHEST  2 VIEW COMPARISON:  None. FINDINGS: The heart size is normal. The lungs are clear. The visualized soft tissues and bony thorax are unremarkable. Patient is status post median sternotomy. Atherosclerotic calcifications are present in the aorta. Slight curvature is present of thoraco lumbar spine. Degenerative changes of the shoulder are worse on the right. IMPRESSION: 1. No acute cardiopulmonary disease. Electronically Signed   By: [name] M.D.   On: 07/03/2016 17:30   Lab Data:  CBC:  Recent Labs Lab 07/03/16 1620 07/05/16 0353 07/06/16 0204  WBC 6.7 6.1 7.5  HGB 10.6* 10.2* 10.8*  HCT 32.0* 32.7* 33.4*  MCV 92.0 93.2 93.3  PLT 373 355 0000000   Basic Metabolic Panel:  Recent Labs Lab 07/03/16 1620 07/05/16 0353 07/06/16 0204  NA 130* 135 137  K 4.0 3.7 4.0  CL 96* 102 105  CO2 25 27 27   GLUCOSE 98 106* 102*  BUN 19 8 <5*  CREATININE 0.78 0.73 0.61  CALCIUM 9.3 9.1 9.3  MG  --   --  2.1   GFR: Estimated Creatinine Clearance: 44.4 mL/min (by C-G formula based on SCr of 0.8 mg/dL). Liver Function Tests:  Recent Labs Lab 07/05/16 0353  AST 13*  ALT 13*  ALKPHOS 44  BILITOT 0.5  PROT 5.5*   ALBUMIN 3.5   No results for input(s): LIPASE, AMYLASE in the last 168 hours. No results for input(s): AMMONIA in the  last 168 hours. Coagulation Profile:  Recent Labs Lab 07/03/16 1620  INR 0.81   Cardiac Enzymes:  Recent Labs Lab 07/03/16 1620 07/03/16 2201 07/04/16 0040 07/04/16 0408  TROPONINI <0.03 <0.03 <0.03 <0.03   BNP (last 3 results) No results for input(s): PROBNP in the last 8760 hours. HbA1C: No results for input(s): HGBA1C in the last 72 hours. CBG: No results for input(s): GLUCAP in the last 168 hours. Lipid Profile:  Recent Labs  07/05/16 0353  CHOL 313*  HDL 59  LDLCALC 217*  TRIG 185*  CHOLHDL 5.3   Thyroid Function Tests:  Recent Labs  07/05/16 0353  TSH 1.259   Anemia Panel: No results for input(s): VITAMINB12, FOLATE, FERRITIN, TIBC, IRON, RETICCTPCT in the last 72 hours. Urine analysis: No results found for: COLORURINE, APPEARANCEUR, LABSPEC, PHURINE, GLUCOSEU, HGBUR, BILIRUBINUR, KETONESUR, PROTEINUR, UROBILINOGEN, NITRITE, Corliss Skains M.D. [facility] 07/06/2016, 10:40 AM  Pager: [phone] Between 7am to 7pm - call Pager - [phone]  After 7pm go to [email or web address] - password [email or web address]  Call night coverage person covering after 7pm

## 2016-07-06 NOTE — Anesthesia Preprocedure Evaluation (Addendum)
Anesthesia Evaluation  Patient identified by MRN, date of birth, ID band Patient awake    Reviewed: Allergy & Precautions, NPO status , Patient's Chart, lab work & pertinent test results, reviewed documented beta blocker date and time   Airway Mallampati: II  TM Distance: >3 FB Neck ROM: Full    Dental  (+) Dental Advisory Given, Partial Lower, Partial Upper   Pulmonary neg pulmonary ROS,    Pulmonary exam normal breath sounds clear to auscultation       Cardiovascular hypertension, Pt. on medications and Pt. on home beta blockers + angina + CAD  Normal cardiovascular exam Rhythm:Regular Rate:Normal  .History of removal of cyst  a. 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  Left Heart Cath and Coronary Angiography Conclusion  Mid RCA lesion, 90 %stenosed. Ost RPDA to RPDA lesion, 70 %stenosed. Ost 1st Mrg lesion, 80 %stenosed. Ost Cx to Prox Cx lesion, 90 %stenosed. Ost LM to LM lesion, 70 %stenosed. Ost 2nd Mrg to 2nd Mrg lesion, 70 %stenosed. Ost LAD to Prox LAD lesion, 70 %stenosed. Lat 2nd Mrg lesion, 99 %stenosed. Mid LAD lesion, 50 %stenosed. The left ventricular systolic function is normal. LV end diastolic pressure is normal. The left ventricular ejection fraction is greater than 65% by visual estimate.  1. Severe triple vessel CAD 2. Severe stenosis involving the left main artery, ostial Circumflex and ostial/proximal LAD. This is best seen in the caudal view.  3. Severe stenosis mid RCA, PDA and obtuse marginal branch of the Circumflex.  4. Normal LV systolic function.  5. Unstable angina   Echo 07/05/16 LV EF: 60% - 65% Study Conclusions  - Left ventricle: The cavity size was normal. Wall thickness wasnormal. Systolic function was normal. The estimated ejectionfraction was in the range of 60% to 65%. LV mid-ventricle false tendon. Wall motion was normal; there  were no regional wallmotion abnormalities. Doppler parameters are consistent withabnormal left ventricular relaxation (grade 1 diastolic dysfunction). The E/e&' ratio is between 8-15, suggestingindeterminate LV filling pressure. - Aortic valve: Sclerosis without stenosis. There was mildregurgitation. - Mitral valve: Mildly thickened leaflets . There was mildregurgitation. - Left atrium: The atrium was normal in size. - Inferior vena cava: The vessel was normal in size. Therespirophasic diameter changes were in the normal range (>= 50%),consistent with normal central venous pressure.  Impressions:  - LVEF 60-65%, normal wall thickness, normal wall motion, diastolicdysfunction, indeterminate LV filling pressure, aortic valvesclerosis with mild AI, mild MR, normal LA size, normal IVC.    Neuro/Psych negative neurological ROS  negative psych ROS   GI/Hepatic negative GI ROS, Neg liver ROS, PUD, GERD  Medicated,  Endo/Other  negative endocrine ROSHypothyroidism   Renal/GU negative Renal ROS     Musculoskeletal negative musculoskeletal ROS (+)   Abdominal   Peds  Hematology negative hematology ROS (+) Blood dyscrasia, anemia , 10.8/33.4   Anesthesia Other Findings Day of surgery medications reviewed with the patient.  Reproductive/Obstetrics                            Anesthesia Physical Anesthesia Plan  ASA: IV  Anesthesia Plan: General   Post-op Pain Management:    Induction: Intravenous  Airway Management Planned: Oral ETT  Additional Equipment: Arterial line, CVP, TEE, PA Cath and Ultrasound Guidance Line Placement  Intra-op Plan:   Post-operative Plan: Post-operative intubation/ventilation  Informed Consent: I have reviewed the patients History and Physical, chart, labs and  discussed the procedure including the risks, benefits and alternatives for the proposed anesthesia with the patient or authorized representative who has  indicated his/her understanding and acceptance.   Dental advisory given  Plan Discussed with: CRNA  Anesthesia Plan Comments: (Risks/benefits of general anesthesia discussed with patient including risk of damage to teeth, lips, gum, and tongue, nausea/vomiting, allergic reactions to medications, and the possibility of heart attack, stroke and death.  All patient questions answered.  Patient wishes to proceed.)        Anesthesia Quick Evaluation

## 2016-07-06 NOTE — Progress Notes (Signed)
ANTICOAGULATION CONSULT NOTE - Follow Up Consult  Pharmacy Consult for Heparin  Indication: chest pain/ACS  Patient Measurements: Height: 4\' 10"  (147.3 cm) Weight: 118 lb 14.4 oz (53.9 kg) IBW/kg (Calculated) : 40.9 Vital Signs: Temp: 99 F (37.2 C) (08/01 2037) Temp Source: Oral (08/01 2037) BP: 147/60 (08/01 2037) Pulse Rate: 73 (08/01 2037)  Labs:  Recent Labs  07/03/16 1620 07/03/16 2201 07/04/16 0040 07/04/16 0408  07/04/16 2356 07/05/16 0353 07/06/16 0204  HGB 10.6*  --   --   --   --   --  10.2* 10.8*  HCT 32.0*  --   --   --   --   --  32.7* 33.4*  PLT 373  --   --   --   --   --  355 349  LABPROT 11.2*  --   --   --   --   --   --   --   INR 0.81  --   --   --   --   --   --   --   HEPARINUNFRC  --   --   --   --   < > 0.43 0.48 0.38  CREATININE 0.78  --   --   --   --   --  0.73 0.61  TROPONINI <0.03 <0.03 <0.03 <0.03  --   --   --   --   < > = values in this interval not displayed.  Estimated Creatinine Clearance: 44.2 mL/min (by C-G formula based on SCr of 0.8 mg/dL).  Assessment: Therapeutic heparin x 1 after re-start s/p cath  Goal of Therapy:  Heparin level 0.3-0.7 units/ml Monitor platelets by anticoagulation protocol: Yes   Plan:  -Cont heparin 650 units/hr -1000 HL  Kalianna Verbeke 07/06/2016,2:44 AM

## 2016-07-07 ENCOUNTER — Inpatient Hospital Stay (HOSPITAL_COMMUNITY): Payer: Medicare Other

## 2016-07-07 ENCOUNTER — Inpatient Hospital Stay (HOSPITAL_COMMUNITY): Payer: Medicare Other | Admitting: Anesthesiology

## 2016-07-07 ENCOUNTER — Encounter (HOSPITAL_COMMUNITY)
Admission: EM | Disposition: A | Discharge status: Home Health | Payer: Self-pay | Source: Home / Self Care | Attending: Surgery

## 2016-07-07 DIAGNOSIS — Z951 Presence of aortocoronary bypass graft: Secondary | ICD-10-CM

## 2016-07-07 HISTORY — PX: INTRAOPERATIVE TRANSESOPHAGEAL ECHOCARDIOGRAM: SHX5062

## 2016-07-07 HISTORY — PX: CORONARY ARTERY BYPASS GRAFT: SHX141

## 2016-07-07 LAB — CBC
HCT: 33.2 % — ABNORMAL LOW (ref 36.0–46.0)
HCT: 35 % — ABNORMAL LOW (ref 36.0–46.0)
HCT: 35.9 % — ABNORMAL LOW (ref 36.0–46.0)
Hemoglobin: 10.4 g/dL — ABNORMAL LOW (ref 12.0–15.0)
Hemoglobin: 11.8 g/dL — ABNORMAL LOW (ref 12.0–15.0)
Hemoglobin: 12.2 g/dL (ref 12.0–15.0)
MCH: 29.1 pg (ref 26.0–34.0)
MCH: 30.4 pg (ref 26.0–34.0)
MCH: 30.2 pg (ref 26.0–34.0)
MCHC: 31.3 g/dL (ref 30.0–36.0)
MCHC: 33.7 g/dL (ref 30.0–36.0)
MCHC: 34 g/dL (ref 30.0–36.0)
MCV: 92.7 fL (ref 78.0–100.0)
MCV: 90.2 fL (ref 78.0–100.0)
MCV: 88.9 fL (ref 78.0–100.0)
Platelets: 352 10*3/uL (ref 150–400)
Platelets: 140 10*3/uL — ABNORMAL LOW (ref 150–400)
Platelets: 154 10*3/uL (ref 150–400)
RBC: 3.58 MIL/uL — ABNORMAL LOW (ref 3.87–5.11)
RBC: 3.88 MIL/uL (ref 3.87–5.11)
RBC: 4.04 MIL/uL (ref 3.87–5.11)
RDW: 14.2 % (ref 11.5–15.5)
RDW: 14.7 % (ref 11.5–15.5)
RDW: 15.2 % (ref 11.5–15.5)
WBC: 7.4 10*3/uL (ref 4.0–10.5)
WBC: 8.8 10*3/uL (ref 4.0–10.5)
WBC: 9.5 10*3/uL (ref 4.0–10.5)

## 2016-07-07 LAB — POCT I-STAT, CHEM 8
BUN: 8 mg/dL (ref 6–20)
BUN: 5 mg/dL — ABNORMAL LOW (ref 6–20)
BUN: 6 mg/dL (ref 6–20)
BUN: 5 mg/dL — ABNORMAL LOW (ref 6–20)
BUN: 4 mg/dL — ABNORMAL LOW (ref 6–20)
BUN: 7 mg/dL (ref 6–20)
Calcium, Ion: 1.2 mmol/L (ref 1.12–1.23)
Calcium, Ion: 0.92 mmol/L — ABNORMAL LOW (ref 1.12–1.23)
Calcium, Ion: 1.24 mmol/L — ABNORMAL HIGH (ref 1.12–1.23)
Calcium, Ion: 0.91 mmol/L — ABNORMAL LOW (ref 1.12–1.23)
Calcium, Ion: 0.91 mmol/L — ABNORMAL LOW (ref 1.12–1.23)
Calcium, Ion: 1.09 mmol/L — ABNORMAL LOW (ref 1.12–1.23)
Chloride: 99 mmol/L — ABNORMAL LOW (ref 101–111)
Chloride: 100 mmol/L — ABNORMAL LOW (ref 101–111)
Chloride: 95 mmol/L — ABNORMAL LOW (ref 101–111)
Chloride: 95 mmol/L — ABNORMAL LOW (ref 101–111)
Chloride: 98 mmol/L — ABNORMAL LOW (ref 101–111)
Chloride: 104 mmol/L (ref 101–111)
Creatinine, Ser: 0.2 mg/dL — ABNORMAL LOW (ref 0.44–1.00)
Creatinine, Ser: 0.3 mg/dL — ABNORMAL LOW (ref 0.44–1.00)
Creatinine, Ser: <0.2 mg/dL — ABNORMAL LOW (ref 0.44–1.00)
Creatinine, Ser: <0.2 mg/dL — ABNORMAL LOW (ref 0.44–1.00)
Creatinine, Ser: 0.3 mg/dL — ABNORMAL LOW (ref 0.44–1.00)
Creatinine, Ser: 0.4 mg/dL — ABNORMAL LOW (ref 0.44–1.00)
Glucose, Bld: 101 mg/dL — ABNORMAL HIGH (ref 65–99)
Glucose, Bld: 110 mg/dL — ABNORMAL HIGH (ref 65–99)
Glucose, Bld: 126 mg/dL — ABNORMAL HIGH (ref 65–99)
Glucose, Bld: 103 mg/dL — ABNORMAL HIGH (ref 65–99)
Glucose, Bld: 103 mg/dL — ABNORMAL HIGH (ref 65–99)
Glucose, Bld: 135 mg/dL — ABNORMAL HIGH (ref 65–99)
HCT: 27 % — ABNORMAL LOW (ref 36.0–46.0)
HCT: 24 % — ABNORMAL LOW (ref 36.0–46.0)
HCT: 24 % — ABNORMAL LOW (ref 36.0–46.0)
HCT: 26 % — ABNORMAL LOW (ref 36.0–46.0)
HCT: 28 % — ABNORMAL LOW (ref 36.0–46.0)
HCT: 35 % — ABNORMAL LOW (ref 36.0–46.0)
Hemoglobin: 9.2 g/dL — ABNORMAL LOW (ref 12.0–15.0)
Hemoglobin: 8.2 g/dL — ABNORMAL LOW (ref 12.0–15.0)
Hemoglobin: 8.2 g/dL — ABNORMAL LOW (ref 12.0–15.0)
Hemoglobin: 8.8 g/dL — ABNORMAL LOW (ref 12.0–15.0)
Hemoglobin: 9.5 g/dL — ABNORMAL LOW (ref 12.0–15.0)
Hemoglobin: 11.9 g/dL — ABNORMAL LOW (ref 12.0–15.0)
Potassium: 3.5 mmol/L (ref 3.5–5.1)
Potassium: 3.4 mmol/L — ABNORMAL LOW (ref 3.5–5.1)
Potassium: 4.6 mmol/L (ref 3.5–5.1)
Potassium: 4.6 mmol/L (ref 3.5–5.1)
Potassium: 4.2 mmol/L (ref 3.5–5.1)
Potassium: 3.9 mmol/L (ref 3.5–5.1)
Sodium: 136 mmol/L (ref 135–145)
Sodium: 134 mmol/L — ABNORMAL LOW (ref 135–145)
Sodium: 134 mmol/L — ABNORMAL LOW (ref 135–145)
Sodium: 134 mmol/L — ABNORMAL LOW (ref 135–145)
Sodium: 136 mmol/L (ref 135–145)
Sodium: 139 mmol/L (ref 135–145)
TCO2: 30 mmol/L (ref 0–100)
TCO2: 28 mmol/L (ref 0–100)
TCO2: 30 mmol/L (ref 0–100)
TCO2: 27 mmol/L (ref 0–100)
TCO2: 26 mmol/L (ref 0–100)
TCO2: 22 mmol/L (ref 0–100)

## 2016-07-07 LAB — COMPREHENSIVE METABOLIC PANEL
ALT: 15 U/L (ref 14–54)
AST: 17 U/L (ref 15–41)
Albumin: 3.6 g/dL (ref 3.5–5.0)
Alkaline Phosphatase: 49 U/L (ref 38–126)
Anion gap: 6 (ref 5–15)
BUN: 8 mg/dL (ref 6–20)
CO2: 27 mmol/L (ref 22–32)
Calcium: 9.3 mg/dL (ref 8.9–10.3)
Chloride: 102 mmol/L (ref 101–111)
Creatinine, Ser: 0.66 mg/dL (ref 0.44–1.00)
GFR calc Af Amer: >60 mL/min (ref >60)
GFR calc non Af Amer: >60 mL/min (ref >60)
Glucose, Bld: 103 mg/dL — ABNORMAL HIGH (ref 65–99)
Potassium: 3.4 mmol/L — ABNORMAL LOW (ref 3.5–5.1)
Sodium: 135 mmol/L (ref 135–145)
Total Bilirubin: 0.2 mg/dL — ABNORMAL LOW (ref 0.3–1.2)
Total Protein: 5.8 g/dL — ABNORMAL LOW (ref 6.5–8.1)

## 2016-07-07 LAB — POCT I-STAT 3, ART BLOOD GAS (G3+)
Acid-Base Excess: 4 mmol/L — ABNORMAL HIGH (ref 0.0–2.0)
Acid-Base Excess: 3 mmol/L — ABNORMAL HIGH (ref 0.0–2.0)
Acid-base deficit: 3 mmol/L — ABNORMAL HIGH (ref 0.0–2.0)
Acid-base deficit: 1 mmol/L (ref 0.0–2.0)
Bicarbonate: 28.8 mEq/L — ABNORMAL HIGH (ref 20.0–24.0)
Bicarbonate: 26.5 mEq/L — ABNORMAL HIGH (ref 20.0–24.0)
Bicarbonate: 25.5 mEq/L — ABNORMAL HIGH (ref 20.0–24.0)
Bicarbonate: 22.8 mEq/L (ref 20.0–24.0)
Bicarbonate: 24.2 mEq/L — ABNORMAL HIGH (ref 20.0–24.0)
O2 Saturation: 100 %
O2 Saturation: 100 %
O2 Saturation: 100 %
O2 Saturation: 99 %
O2 Saturation: 98 %
Patient temperature: 36.1
Patient temperature: 36.8
Patient temperature: 37
TCO2: 30 mmol/L (ref 0–100)
TCO2: 28 mmol/L (ref 0–100)
TCO2: 27 mmol/L (ref 0–100)
TCO2: 24 mmol/L (ref 0–100)
TCO2: 25 mmol/L (ref 0–100)
pCO2 arterial: 41.2 mmHg (ref 35.0–45.0)
pCO2 arterial: 35.1 mmHg (ref 35.0–45.0)
pCO2 arterial: 42 mmHg (ref 35.0–45.0)
pCO2 arterial: 40.7 mmHg (ref 35.0–45.0)
pCO2 arterial: 41 mmHg (ref 35.0–45.0)
pH, Arterial: 7.453 — ABNORMAL HIGH (ref 7.350–7.450)
pH, Arterial: 7.486 — ABNORMAL HIGH (ref 7.350–7.450)
pH, Arterial: 7.388 (ref 7.350–7.450)
pH, Arterial: 7.355 (ref 7.350–7.450)
pH, Arterial: 7.378 (ref 7.350–7.450)
pO2, Arterial: 555 mmHg — ABNORMAL HIGH (ref 80.0–100.0)
pO2, Arterial: 350 mmHg — ABNORMAL HIGH (ref 80.0–100.0)
pO2, Arterial: 181 mmHg — ABNORMAL HIGH (ref 80.0–100.0)
pO2, Arterial: 149 mmHg — ABNORMAL HIGH (ref 80.0–100.0)
pO2, Arterial: 112 mmHg — ABNORMAL HIGH (ref 80.0–100.0)

## 2016-07-07 LAB — CREATININE, SERUM
Creatinine, Ser: 0.52 mg/dL (ref 0.44–1.00)
GFR calc Af Amer: >60 mL/min (ref >60)
GFR calc non Af Amer: >60 mL/min (ref >60)

## 2016-07-07 LAB — BLOOD GAS, ARTERIAL
Acid-Base Excess: 1.6 mmol/L (ref 0.0–2.0)
Bicarbonate: 25.2 mEq/L — ABNORMAL HIGH (ref 20.0–24.0)
Drawn by: 44135
FIO2: 0.21
O2 Saturation: 95.4 %
Patient temperature: 98.6
TCO2: 26.3 mmol/L (ref 0–100)
pCO2 arterial: 36.4 mmHg (ref 35.0–45.0)
pH, Arterial: 7.454 — ABNORMAL HIGH (ref 7.350–7.450)
pO2, Arterial: 81.9 mmHg (ref 80.0–100.0)

## 2016-07-07 LAB — HEMOGLOBIN A1C
Hgb A1c MFr Bld: 5.3 % (ref 4.8–5.6)
Mean Plasma Glucose: 105 mg/dL

## 2016-07-07 LAB — HEPARIN LEVEL (UNFRACTIONATED): Heparin Unfractionated: 0.41 IU/mL (ref 0.30–0.70)

## 2016-07-07 LAB — PROTIME-INR
INR: 1.58
Prothrombin Time: 19 seconds — ABNORMAL HIGH (ref 11.4–15.2)

## 2016-07-07 LAB — PLATELET COUNT: Platelets: 133 10*3/uL — ABNORMAL LOW (ref 150–400)

## 2016-07-07 LAB — HEMOGLOBIN AND HEMATOCRIT, BLOOD
HCT: 26.2 % — ABNORMAL LOW (ref 36.0–46.0)
Hemoglobin: 8.8 g/dL — ABNORMAL LOW (ref 12.0–15.0)

## 2016-07-07 LAB — POCT I-STAT 4, (NA,K, GLUC, HGB,HCT)
Glucose, Bld: 89 mg/dL (ref 65–99)
HCT: 33 % — ABNORMAL LOW (ref 36.0–46.0)
Hemoglobin: 11.2 g/dL — ABNORMAL LOW (ref 12.0–15.0)
Potassium: 3.6 mmol/L (ref 3.5–5.1)
Sodium: 139 mmol/L (ref 135–145)

## 2016-07-07 LAB — APTT
aPTT: 72 seconds — ABNORMAL HIGH (ref 24–36)
aPTT: 44 seconds — ABNORMAL HIGH (ref 24–36)

## 2016-07-07 LAB — MAGNESIUM: Magnesium: 3.3 mg/dL — ABNORMAL HIGH (ref 1.7–2.4)

## 2016-07-07 LAB — PREPARE RBC (CROSSMATCH)

## 2016-07-07 SURGERY — CORONARY ARTERY BYPASS GRAFTING (CABG)
Anesthesia: General | Site: Chest

## 2016-07-07 MED ORDER — ROCURONIUM BROMIDE 100 MG/10ML IV SOLN
INTRAVENOUS | Status: DC | PRN
  Administered 2016-07-07: 20 mg via INTRAVENOUS
  Administered 2016-07-07: 50 mg via INTRAVENOUS
  Administered 2016-07-07: 70 mg via INTRAVENOUS
  Administered 2016-07-07 (×2): 30 mg via INTRAVENOUS

## 2016-07-07 MED ORDER — DOCUSATE SODIUM 100 MG PO CAPS
200.0000 mg | ORAL_CAPSULE | Freq: Every day | ORAL | Status: DC
  Administered 2016-07-08 – 2016-07-10 (×3): 200 mg via ORAL
  Filled 2016-07-07 (×3): qty 2

## 2016-07-07 MED ORDER — SODIUM CHLORIDE 0.9 % IV SOLN: INTRAVENOUS | Status: DC

## 2016-07-07 MED ORDER — ANTISEPTIC ORAL RINSE SOLUTION (CORINZ)
7.0000 mL | Freq: Four times a day (QID) | OROMUCOSAL | Status: DC
  Administered 2016-07-08 – 2016-07-09 (×3): 7 mL via OROMUCOSAL

## 2016-07-07 MED ORDER — LACTATED RINGERS IV SOLN: INTRAVENOUS | Status: DC

## 2016-07-07 MED ORDER — ARTIFICIAL TEARS OP OINT
TOPICAL_OINTMENT | OPHTHALMIC | Status: DC | PRN
  Administered 2016-07-07: 1 via OPHTHALMIC

## 2016-07-07 MED ORDER — PROTAMINE SULFATE 10 MG/ML IV SOLN
INTRAVENOUS | Status: DC | PRN
  Administered 2016-07-07 (×3): 50 mg via INTRAVENOUS
  Administered 2016-07-07: 10 mg via INTRAVENOUS
  Administered 2016-07-07: 40 mg via INTRAVENOUS

## 2016-07-07 MED ORDER — SODIUM CHLORIDE 0.9% FLUSH: 3.0000 mL | INTRAVENOUS | Status: DC | PRN

## 2016-07-07 MED ORDER — PANTOPRAZOLE SODIUM 40 MG PO TBEC
40.0000 mg | DELAYED_RELEASE_TABLET | Freq: Every day | ORAL | Status: DC
  Administered 2016-07-09: 40 mg via ORAL
  Filled 2016-07-07: qty 1

## 2016-07-07 MED ORDER — ACETAMINOPHEN 160 MG/5ML PO SOLN: 650.0000 mg | Freq: Once | ORAL | Status: AC

## 2016-07-07 MED ORDER — SODIUM CHLORIDE 0.9 % IV SOLN
INTRAVENOUS | Status: DC
  Administered 2016-07-07: 20:00:00 via INTRAVENOUS
  Filled 2016-07-07 (×2): qty 2.5

## 2016-07-07 MED ORDER — METOPROLOL TARTRATE 12.5 MG HALF TABLET
12.5000 mg | ORAL_TABLET | Freq: Two times a day (BID) | ORAL | Status: DC
  Administered 2016-07-08 – 2016-07-11 (×7): 12.5 mg via ORAL
  Filled 2016-07-07 (×7): qty 1

## 2016-07-07 MED ORDER — PHENYLEPHRINE HCL 10 MG/ML IJ SOLN
INTRAMUSCULAR | Status: DC | PRN
  Administered 2016-07-07: 40 ug via INTRAVENOUS

## 2016-07-07 MED ORDER — VANCOMYCIN HCL 1000 MG IV SOLR
INTRAVENOUS | Status: DC | PRN
  Administered 2016-07-07: 1000 mg via INTRAVENOUS

## 2016-07-07 MED ORDER — ASPIRIN EC 325 MG PO TBEC
325.0000 mg | DELAYED_RELEASE_TABLET | Freq: Every day | ORAL | Status: DC
  Administered 2016-07-08 – 2016-07-13 (×6): 325 mg via ORAL
  Filled 2016-07-07 (×6): qty 1

## 2016-07-07 MED ORDER — HEPARIN SODIUM (PORCINE) 1000 UNIT/ML IJ SOLN
INTRAMUSCULAR | Status: DC | PRN
  Administered 2016-07-07: 10000 [IU] via INTRAVENOUS
  Administered 2016-07-07: 20000 [IU] via INTRAVENOUS

## 2016-07-07 MED ORDER — FENTANYL CITRATE (PF) 250 MCG/5ML IJ SOLN
INTRAMUSCULAR | Status: AC
  Filled 2016-07-07: qty 5

## 2016-07-07 MED ORDER — MIDAZOLAM HCL 5 MG/ML IJ SOLN
INTRAMUSCULAR | Status: DC | PRN
  Administered 2016-07-07 (×2): 2 mg via INTRAVENOUS
  Administered 2016-07-07: 1 mg via INTRAVENOUS

## 2016-07-07 MED ORDER — NITROGLYCERIN IN D5W 200-5 MCG/ML-% IV SOLN: 0.0000 ug/min | INTRAVENOUS | Status: DC

## 2016-07-07 MED ORDER — METOCLOPRAMIDE HCL 5 MG/ML IJ SOLN
10.0000 mg | Freq: Four times a day (QID) | INTRAMUSCULAR | Status: AC
  Administered 2016-07-07 – 2016-07-09 (×8): 10 mg via INTRAVENOUS
  Filled 2016-07-07 (×7): qty 2

## 2016-07-07 MED ORDER — SODIUM CHLORIDE 0.45 % IV SOLN
INTRAVENOUS | Status: DC | PRN
  Administered 2016-07-07: 14:00:00 via INTRAVENOUS

## 2016-07-07 MED ORDER — LIDOCAINE HCL (CARDIAC) 20 MG/ML IV SOLN
INTRAVENOUS | Status: DC | PRN
  Administered 2016-07-07: 60 mg via INTRATRACHEAL

## 2016-07-07 MED ORDER — SODIUM CHLORIDE 0.9% FLUSH
3.0000 mL | Freq: Two times a day (BID) | INTRAVENOUS | Status: DC
  Administered 2016-07-08 – 2016-07-12 (×5): 3 mL via INTRAVENOUS

## 2016-07-07 MED ORDER — MIDAZOLAM HCL 10 MG/2ML IJ SOLN
INTRAMUSCULAR | Status: AC
  Filled 2016-07-07: qty 2

## 2016-07-07 MED ORDER — LACTATED RINGERS IV SOLN
INTRAVENOUS | Status: DC | PRN
  Administered 2016-07-07 (×2): via INTRAVENOUS

## 2016-07-07 MED ORDER — BISACODYL 5 MG PO TBEC
10.0000 mg | DELAYED_RELEASE_TABLET | Freq: Every day | ORAL | Status: DC
  Administered 2016-07-08 – 2016-07-10 (×3): 10 mg via ORAL
  Filled 2016-07-07 (×2): qty 2

## 2016-07-07 MED ORDER — BISACODYL 10 MG RE SUPP: 10.0000 mg | Freq: Every day | RECTAL | Status: DC

## 2016-07-07 MED ORDER — FENTANYL CITRATE (PF) 250 MCG/5ML IJ SOLN
INTRAMUSCULAR | Status: DC | PRN
  Administered 2016-07-07 (×3): 100 ug via INTRAVENOUS
  Administered 2016-07-07: 250 ug via INTRAVENOUS
  Administered 2016-07-07: 150 ug via INTRAVENOUS
  Administered 2016-07-07: 100 ug via INTRAVENOUS
  Administered 2016-07-07: 250 ug via INTRAVENOUS
  Administered 2016-07-07: 50 ug via INTRAVENOUS
  Administered 2016-07-07: 200 ug via INTRAVENOUS
  Administered 2016-07-07: 50 ug via INTRAVENOUS
  Administered 2016-07-07: 100 ug via INTRAVENOUS

## 2016-07-07 MED ORDER — OXYCODONE HCL 5 MG PO TABS
5.0000 mg | ORAL_TABLET | ORAL | Status: DC | PRN
  Administered 2016-07-08 – 2016-07-09 (×2): 5 mg via ORAL
  Filled 2016-07-07 (×2): qty 1

## 2016-07-07 MED ORDER — FAMOTIDINE IN NACL 20-0.9 MG/50ML-% IV SOLN
20.0000 mg | INTRAVENOUS | Status: DC
  Administered 2016-07-07: 20 mg via INTRAVENOUS

## 2016-07-07 MED ORDER — MIDAZOLAM HCL 2 MG/2ML IJ SOLN: 2.0000 mg | INTRAMUSCULAR | Status: DC | PRN

## 2016-07-07 MED ORDER — ALBUMIN HUMAN 5 % IV SOLN
250.0000 mL | INTRAVENOUS | Status: AC | PRN
  Administered 2016-07-07 (×2): 250 mL via INTRAVENOUS
  Filled 2016-07-07: qty 250

## 2016-07-07 MED ORDER — ASPIRIN 81 MG PO CHEW
324.0000 mg | CHEWABLE_TABLET | Freq: Every day | ORAL | Status: DC
Start: 2016-07-08 — End: 2016-07-09

## 2016-07-07 MED ORDER — MORPHINE SULFATE (PF) 2 MG/ML IV SOLN
1.0000 mg | INTRAVENOUS | Status: AC | PRN
  Administered 2016-07-07: 1 mg via INTRAVENOUS
  Filled 2016-07-07: qty 1

## 2016-07-07 MED ORDER — ACETAMINOPHEN 650 MG RE SUPP
650.0000 mg | Freq: Once | RECTAL | Status: AC
  Administered 2016-07-07: 650 mg via RECTAL

## 2016-07-07 MED ORDER — DEXMEDETOMIDINE HCL IN NACL 400 MCG/100ML IV SOLN
INTRAVENOUS | Status: DC | PRN
Start: 2016-07-07 — End: 2016-07-07
  Administered 2016-07-07: .5 ug/kg/h via INTRAVENOUS

## 2016-07-07 MED ORDER — LACTATED RINGERS IV SOLN
INTRAVENOUS | Status: DC | PRN
  Administered 2016-07-07: 07:00:00 via INTRAVENOUS

## 2016-07-07 MED ORDER — PROPOFOL 10 MG/ML IV BOLUS
INTRAVENOUS | Status: AC
  Filled 2016-07-07: qty 20

## 2016-07-07 MED ORDER — PROPOFOL 10 MG/ML IV BOLUS
INTRAVENOUS | Status: DC | PRN
  Administered 2016-07-07: 120 mg via INTRAVENOUS

## 2016-07-07 MED ORDER — SODIUM CHLORIDE 0.9 % IV SOLN
INTRAVENOUS | Status: DC | PRN
  Administered 2016-07-07: .8 [IU]/h via INTRAVENOUS

## 2016-07-07 MED ORDER — SODIUM CHLORIDE 0.9 % IV SOLN: 250.0000 mL | INTRAVENOUS | Status: DC

## 2016-07-07 MED ORDER — POTASSIUM CHLORIDE 10 MEQ/50ML IV SOLN
10.0000 meq | INTRAVENOUS | Status: AC
  Administered 2016-07-07 (×3): 10 meq via INTRAVENOUS

## 2016-07-07 MED ORDER — 0.9 % SODIUM CHLORIDE (POUR BTL) OPTIME
TOPICAL | Status: DC | PRN
  Administered 2016-07-07: 5000 mL

## 2016-07-07 MED ORDER — DEXMEDETOMIDINE HCL IN NACL 200 MCG/50ML IV SOLN
0.0000 ug/kg/h | INTRAVENOUS | Status: DC
  Filled 2016-07-07: qty 50

## 2016-07-07 MED ORDER — CHLORHEXIDINE GLUCONATE 0.12 % MT SOLN
15.0000 mL | OROMUCOSAL | Status: AC
  Administered 2016-07-07: 15 mL via OROMUCOSAL

## 2016-07-07 MED ORDER — PHENYLEPHRINE HCL 10 MG/ML IJ SOLN
INTRAMUSCULAR | Status: DC | PRN
  Administered 2016-07-07: 10 ug/min via INTRAVENOUS

## 2016-07-07 MED ORDER — CHLORHEXIDINE GLUCONATE 0.12% ORAL RINSE (MEDLINE KIT)
15.0000 mL | Freq: Two times a day (BID) | OROMUCOSAL | Status: DC
  Administered 2016-07-07 – 2016-07-12 (×8): 15 mL via OROMUCOSAL

## 2016-07-07 MED ORDER — HEMOSTATIC AGENTS (NO CHARGE) OPTIME
TOPICAL | Status: DC | PRN
  Administered 2016-07-07: 1 via TOPICAL

## 2016-07-07 MED ORDER — VANCOMYCIN HCL IN DEXTROSE 1-5 GM/200ML-% IV SOLN
1000.0000 mg | Freq: Once | INTRAVENOUS | Status: AC
  Administered 2016-07-07: 1000 mg via INTRAVENOUS
  Filled 2016-07-07: qty 200

## 2016-07-07 MED ORDER — HEPARIN SODIUM (PORCINE) 1000 UNIT/ML IJ SOLN
INTRAMUSCULAR | Status: AC
Start: 2016-07-07 — End: 2016-07-07
  Filled 2016-07-07: qty 1

## 2016-07-07 MED ORDER — LEVOFLOXACIN IN D5W 500 MG/100ML IV SOLN
INTRAVENOUS | Status: DC | PRN
  Administered 2016-07-07: 500 mg via INTRAVENOUS

## 2016-07-07 MED ORDER — NITROGLYCERIN IN D5W 200-5 MCG/ML-% IV SOLN
INTRAVENOUS | Status: DC | PRN
  Administered 2016-07-07: 16.6 ug/min via INTRAVENOUS

## 2016-07-07 MED ORDER — PHENYLEPHRINE HCL 10 MG/ML IJ SOLN
0.0000 ug/min | INTRAVENOUS | Status: DC
  Filled 2016-07-07: qty 2

## 2016-07-07 MED ORDER — TRAMADOL HCL 50 MG PO TABS
50.0000 mg | ORAL_TABLET | ORAL | Status: DC | PRN
  Administered 2016-07-08: 50 mg via ORAL
  Administered 2016-07-08 – 2016-07-09 (×2): 100 mg via ORAL
  Filled 2016-07-07 (×2): qty 2
  Filled 2016-07-07: qty 1

## 2016-07-07 MED ORDER — METOPROLOL TARTRATE 5 MG/5ML IV SOLN: 2.5000 mg | INTRAVENOUS | Status: DC | PRN

## 2016-07-07 MED ORDER — ALBUMIN HUMAN 5 % IV SOLN
INTRAVENOUS | Status: DC | PRN
  Administered 2016-07-07 (×2): via INTRAVENOUS

## 2016-07-07 MED ORDER — PROTAMINE SULFATE 10 MG/ML IV SOLN
INTRAVENOUS | Status: AC
  Filled 2016-07-07: qty 25

## 2016-07-07 MED ORDER — THROMBIN 20000 UNITS EX SOLR
CUTANEOUS | Status: AC
  Filled 2016-07-07: qty 20000

## 2016-07-07 MED ORDER — INSULIN REGULAR BOLUS VIA INFUSION
0.0000 [IU] | Freq: Three times a day (TID) | INTRAVENOUS | Status: DC
  Filled 2016-07-07: qty 10

## 2016-07-07 MED ORDER — ACETAMINOPHEN 160 MG/5ML PO SOLN: 1000.0000 mg | Freq: Four times a day (QID) | ORAL | Status: DC

## 2016-07-07 MED ORDER — MAGNESIUM SULFATE 4 GM/100ML IV SOLN
4.0000 g | Freq: Once | INTRAVENOUS | Status: AC
  Administered 2016-07-07: 4 g via INTRAVENOUS
  Filled 2016-07-07: qty 100

## 2016-07-07 MED ORDER — SODIUM CHLORIDE 0.9 % IV SOLN
INTRAVENOUS | Status: DC | PRN
  Administered 2016-07-07: 5 g/h via INTRAVENOUS

## 2016-07-07 MED ORDER — ACETAMINOPHEN 500 MG PO TABS
1000.0000 mg | ORAL_TABLET | Freq: Four times a day (QID) | ORAL | Status: AC
  Administered 2016-07-07 – 2016-07-12 (×16): 1000 mg via ORAL
  Filled 2016-07-07 (×17): qty 2

## 2016-07-07 MED ORDER — FENTANYL CITRATE (PF) 250 MCG/5ML IJ SOLN
INTRAMUSCULAR | Status: AC
  Filled 2016-07-07: qty 25

## 2016-07-07 MED ORDER — METOPROLOL TARTRATE 25 MG/10 ML ORAL SUSPENSION: 12.5000 mg | Freq: Two times a day (BID) | ORAL | Status: DC

## 2016-07-07 MED ORDER — LIDOCAINE 2% (20 MG/ML) 5 ML SYRINGE
INTRAMUSCULAR | Status: AC
  Filled 2016-07-07: qty 5

## 2016-07-07 MED ORDER — MORPHINE SULFATE (PF) 2 MG/ML IV SOLN
2.0000 mg | INTRAVENOUS | Status: DC | PRN
  Administered 2016-07-07: 4 mg via INTRAVENOUS
  Administered 2016-07-07 (×2): 2 mg via INTRAVENOUS
  Administered 2016-07-08: 4 mg via INTRAVENOUS
  Administered 2016-07-08 (×2): 2 mg via INTRAVENOUS
  Filled 2016-07-07: qty 2
  Filled 2016-07-07 (×2): qty 1
  Filled 2016-07-07: qty 2
  Filled 2016-07-07 (×2): qty 1

## 2016-07-07 MED ORDER — ROCURONIUM BROMIDE 50 MG/5ML IV SOLN
INTRAVENOUS | Status: AC
  Filled 2016-07-07: qty 1

## 2016-07-07 MED ORDER — LACTATED RINGERS IV SOLN: 500.0000 mL | Freq: Once | INTRAVENOUS | Status: DC | PRN

## 2016-07-07 MED ORDER — ONDANSETRON HCL 4 MG/2ML IJ SOLN
INTRAMUSCULAR | Status: AC
  Filled 2016-07-07: qty 2

## 2016-07-07 MED ORDER — LEVOFLOXACIN IN D5W 750 MG/150ML IV SOLN
750.0000 mg | INTRAVENOUS | Status: AC
  Administered 2016-07-08: 750 mg via INTRAVENOUS
  Filled 2016-07-07: qty 150

## 2016-07-07 MED ORDER — ROCURONIUM BROMIDE 50 MG/5ML IV SOLN
INTRAVENOUS | Status: AC
Start: 2016-07-07 — End: 2016-07-07
  Filled 2016-07-07: qty 2

## 2016-07-07 MED ORDER — ONDANSETRON HCL 4 MG/2ML IJ SOLN
4.0000 mg | Freq: Four times a day (QID) | INTRAMUSCULAR | Status: DC | PRN
  Administered 2016-07-08 – 2016-07-10 (×3): 4 mg via INTRAVENOUS
  Filled 2016-07-07 (×3): qty 2

## 2016-07-07 MED ORDER — ARTIFICIAL TEARS OP OINT
TOPICAL_OINTMENT | OPHTHALMIC | Status: AC
  Filled 2016-07-07: qty 3.5

## 2016-07-07 MED ORDER — PLASMA-LYTE 148 IV SOLN
INTRAVENOUS | Status: DC | PRN
  Administered 2016-07-07: 500 mL via INTRAVENOUS

## 2016-07-07 MED ORDER — THROMBIN 20000 UNITS EX SOLR
OROMUCOSAL | Status: DC | PRN
  Administered 2016-07-07 (×3): 4 mL via TOPICAL

## 2016-07-07 MED FILL — Sodium Bicarbonate IV Soln 8.4%: INTRAVENOUS | Qty: 50 | Status: AC

## 2016-07-07 MED FILL — Mannitol IV Soln 20%: INTRAVENOUS | Qty: 500 | Status: AC

## 2016-07-07 MED FILL — Lidocaine HCl IV Inj 20 MG/ML: INTRAVENOUS | Qty: 5 | Status: AC

## 2016-07-07 MED FILL — Potassium Chloride Inj 2 mEq/ML: INTRAVENOUS | Qty: 40 | Status: AC

## 2016-07-07 MED FILL — Heparin Sodium (Porcine) Inj 1000 Unit/ML: INTRAMUSCULAR | Qty: 30 | Status: AC

## 2016-07-07 MED FILL — Magnesium Sulfate Inj 50%: INTRAMUSCULAR | Qty: 10 | Status: AC

## 2016-07-07 MED FILL — Electrolyte-R (PH 7.4) Solution: INTRAVENOUS | Qty: 7000 | Status: AC

## 2016-07-07 MED FILL — Heparin Sodium (Porcine) Inj 1000 Unit/ML: INTRAMUSCULAR | Qty: 10 | Status: AC

## 2016-07-07 MED FILL — Sodium Chloride IV Soln 0.9%: INTRAVENOUS | Qty: 2000 | Status: AC

## 2016-07-07 SURGICAL SUPPLY — 113 items
BAG DECANTER FOR FLEXI CONT (MISCELLANEOUS) ×3 IMPLANT
BANDAGE ACE 4X5 VEL STRL LF (GAUZE/BANDAGES/DRESSINGS) ×3 IMPLANT
BANDAGE ACE 6X5 VEL STRL LF (GAUZE/BANDAGES/DRESSINGS) ×3 IMPLANT
BANDAGE ELASTIC 4 VELCRO ST LF (GAUZE/BANDAGES/DRESSINGS) ×3 IMPLANT
BANDAGE ELASTIC 6 VELCRO ST LF (GAUZE/BANDAGES/DRESSINGS) ×3 IMPLANT
BASKET HEART  (ORDER IN 25'S) (MISCELLANEOUS) ×1
BASKET HEART (ORDER IN 25'S) (MISCELLANEOUS) ×1
BASKET HEART (ORDER IN 25S) (MISCELLANEOUS) ×1 IMPLANT
BLADE OSCILLATING /SAGITTAL (BLADE) ×3 IMPLANT
BLADE STERNUM SYSTEM 6 (BLADE) ×3 IMPLANT
BLADE SURG 11 STRL SS (BLADE) ×3 IMPLANT
BNDG GAUZE ELAST 4 BULKY (GAUZE/BANDAGES/DRESSINGS) ×3 IMPLANT
CANISTER SUCTION 2500CC (MISCELLANEOUS) ×3 IMPLANT
CATH FOLEY LATEX FREE 16FR (CATHETERS) ×2
CATH FOLEY LF 16FR (CATHETERS) ×1 IMPLANT
CATH ROBINSON RED A/P 18FR (CATHETERS) IMPLANT
CATH THORACIC 28FR (CATHETERS) ×3 IMPLANT
CATH THORACIC 36FR (CATHETERS) ×3 IMPLANT
CATH THORACIC 36FR RT ANG (CATHETERS) ×3 IMPLANT
CLIP TI MEDIUM 24 (CLIP) IMPLANT
CLIP TI WIDE RED SMALL 24 (CLIP) ×3 IMPLANT
CRADLE DONUT ADULT HEAD (MISCELLANEOUS) ×3 IMPLANT
DERMABOND ADVANCED (GAUZE/BANDAGES/DRESSINGS) ×2
DERMABOND ADVANCED .7 DNX12 (GAUZE/BANDAGES/DRESSINGS) ×1 IMPLANT
DRAPE CARDIOVASCULAR INCISE (DRAPES) ×2
DRAPE INCISE IOBAN 66X45 STRL (DRAPES) ×3 IMPLANT
DRAPE SLUSH/WARMER DISC (DRAPES) ×3 IMPLANT
DRAPE SRG 135X102X78XABS (DRAPES) ×1 IMPLANT
DRSG COVADERM 4X14 (GAUZE/BANDAGES/DRESSINGS) ×3 IMPLANT
ELECT CAUTERY BLADE 6.4 (BLADE) ×3 IMPLANT
ELECT REM PT RETURN 9FT ADLT (ELECTROSURGICAL) ×6
ELECTRODE REM PT RTRN 9FT ADLT (ELECTROSURGICAL) ×2 IMPLANT
FELT TEFLON 1X6 (MISCELLANEOUS) ×3 IMPLANT
GAUZE SPONGE 4X4 12PLY STRL (GAUZE/BANDAGES/DRESSINGS) ×6 IMPLANT
GLOVE BIO SURGEON STRL SZ 6 (GLOVE) IMPLANT
GLOVE BIO SURGEON STRL SZ 6.5 (GLOVE) IMPLANT
GLOVE BIO SURGEON STRL SZ7 (GLOVE) IMPLANT
GLOVE BIO SURGEON STRL SZ7.5 (GLOVE) IMPLANT
GLOVE BIO SURGEONS STRL SZ 6.5 (GLOVE)
GLOVE BIOGEL PI IND STRL 6 (GLOVE) IMPLANT
GLOVE BIOGEL PI IND STRL 6.5 (GLOVE) ×2 IMPLANT
GLOVE BIOGEL PI IND STRL 7.0 (GLOVE) IMPLANT
GLOVE BIOGEL PI INDICATOR 6 (GLOVE)
GLOVE BIOGEL PI INDICATOR 6.5 (GLOVE) ×4
GLOVE BIOGEL PI INDICATOR 7.0 (GLOVE)
GLOVE EUDERMIC 7 POWDERFREE (GLOVE) ×6 IMPLANT
GLOVE ORTHO TXT STRL SZ7.5 (GLOVE) IMPLANT
GLOVE SURG SS PI 6.0 STRL IVOR (GLOVE) ×12 IMPLANT
GLOVE SURG SS PI 7.0 STRL IVOR (GLOVE) ×9 IMPLANT
GOWN STRL REUS W/ TWL LRG LVL3 (GOWN DISPOSABLE) ×6 IMPLANT
GOWN STRL REUS W/ TWL XL LVL3 (GOWN DISPOSABLE) ×1 IMPLANT
GOWN STRL REUS W/TWL LRG LVL3 (GOWN DISPOSABLE) ×12
GOWN STRL REUS W/TWL XL LVL3 (GOWN DISPOSABLE) ×2
HEMOSTAT POWDER SURGIFOAM 1G (HEMOSTASIS) ×9 IMPLANT
HEMOSTAT SURGICEL 2X14 (HEMOSTASIS) ×3 IMPLANT
INSERT FOGARTY 61MM (MISCELLANEOUS) IMPLANT
INSERT FOGARTY XLG (MISCELLANEOUS) IMPLANT
KIT BASIN OR (CUSTOM PROCEDURE TRAY) ×3 IMPLANT
KIT CATH CPB BARTLE (MISCELLANEOUS) ×3 IMPLANT
KIT ROOM TURNOVER OR (KITS) ×3 IMPLANT
KIT SUCTION CATH 14FR (SUCTIONS) ×12 IMPLANT
KIT VASOVIEW 6 PRO VH 2400 (KITS) IMPLANT
KIT VASOVIEW HEMOPRO VH 3000 (KITS) ×3 IMPLANT
NS IRRIG 1000ML POUR BTL (IV SOLUTION) ×18 IMPLANT
PACK OPEN HEART (CUSTOM PROCEDURE TRAY) ×3 IMPLANT
PAD ARMBOARD 7.5X6 YLW CONV (MISCELLANEOUS) ×6 IMPLANT
PAD ELECT DEFIB RADIOL ZOLL (MISCELLANEOUS) ×3 IMPLANT
PENCIL BUTTON HOLSTER BLD 10FT (ELECTRODE) ×3 IMPLANT
PUNCH AORTIC ROT 4.0MM RCL 40 (MISCELLANEOUS) ×3 IMPLANT
PUNCH AORTIC ROTATE 4.0MM (MISCELLANEOUS) IMPLANT
PUNCH AORTIC ROTATE 4.5MM 8IN (MISCELLANEOUS) ×3 IMPLANT
PUNCH AORTIC ROTATE 5MM 8IN (MISCELLANEOUS) IMPLANT
SET CARDIOPLEGIA MPS 5001102 (MISCELLANEOUS) ×3 IMPLANT
SPONGE GAUZE 4X4 12PLY STER LF (GAUZE/BANDAGES/DRESSINGS) ×6 IMPLANT
SPONGE INTESTINAL PEANUT (DISPOSABLE) IMPLANT
SPONGE LAP 18X18 X RAY DECT (DISPOSABLE) ×3 IMPLANT
SPONGE LAP 4X18 X RAY DECT (DISPOSABLE) ×3 IMPLANT
SUT BONE WAX W31G (SUTURE) ×3 IMPLANT
SUT ETHILON 3 0 FSL (SUTURE) ×3 IMPLANT
SUT MNCRL AB 4-0 PS2 18 (SUTURE) ×6 IMPLANT
SUT PROLENE 3 0 SH DA (SUTURE) IMPLANT
SUT PROLENE 3 0 SH1 36 (SUTURE) ×3 IMPLANT
SUT PROLENE 4 0 RB 1 (SUTURE)
SUT PROLENE 4 0 SH DA (SUTURE) IMPLANT
SUT PROLENE 4-0 RB1 .5 CRCL 36 (SUTURE) IMPLANT
SUT PROLENE 5 0 C 1 36 (SUTURE) IMPLANT
SUT PROLENE 6 0 C 1 30 (SUTURE) IMPLANT
SUT PROLENE 7 0 BV 1 (SUTURE) IMPLANT
SUT PROLENE 7 0 BV1 MDA (SUTURE) ×6 IMPLANT
SUT PROLENE 8 0 BV175 6 (SUTURE) ×12 IMPLANT
SUT SILK  1 MH (SUTURE)
SUT SILK 1 MH (SUTURE) IMPLANT
SUT STEEL 6MS V (SUTURE) ×6 IMPLANT
SUT STEEL STERNAL CCS#1 18IN (SUTURE) IMPLANT
SUT STEEL SZ 6 DBL 3X14 BALL (SUTURE) IMPLANT
SUT VIC AB 1 CTX 36 (SUTURE) ×6
SUT VIC AB 1 CTX36XBRD ANBCTR (SUTURE) ×3 IMPLANT
SUT VIC AB 2-0 CT1 27 (SUTURE) ×4
SUT VIC AB 2-0 CT1 TAPERPNT 27 (SUTURE) ×2 IMPLANT
SUT VIC AB 2-0 CTX 27 (SUTURE) IMPLANT
SUT VIC AB 3-0 SH 27 (SUTURE)
SUT VIC AB 3-0 SH 27X BRD (SUTURE) IMPLANT
SUT VIC AB 3-0 X1 27 (SUTURE) IMPLANT
SUT VICRYL 4-0 PS2 18IN ABS (SUTURE) ×3 IMPLANT
SUTURE E-PAK OPEN HEART (SUTURE) ×3 IMPLANT
SYSTEM SAHARA CHEST DRAIN ATS (WOUND CARE) ×3 IMPLANT
TAPE CLOTH SURG 4X10 WHT LF (GAUZE/BANDAGES/DRESSINGS) ×6 IMPLANT
TOWEL OR 17X24 6PK STRL BLUE (TOWEL DISPOSABLE) ×3 IMPLANT
TOWEL OR 17X26 10 PK STRL BLUE (TOWEL DISPOSABLE) ×3 IMPLANT
TRAY FOLEY IC TEMP SENS 16FR (CATHETERS) IMPLANT
TUBING INSUFFLATION (TUBING) ×3 IMPLANT
UNDERPAD 30X30 INCONTINENT (UNDERPADS AND DIAPERS) ×3 IMPLANT
WATER STERILE IRR 1000ML POUR (IV SOLUTION) ×6 IMPLANT

## 2016-07-07 NOTE — Anesthesia Procedure Notes (Addendum)
Central Venous Catheter Insertion Performed by: anesthesiologist 07/07/2016 6:30 AM Patient location: Pre-op. Preanesthetic checklist: patient identified, IV checked, site marked, risks and benefits discussed, surgical consent, monitors and equipment checked, pre-op evaluation, timeout performed and anesthesia consent Position: Trendelenburg Lidocaine 1% used for infiltration Landmarks identified and Seldinger technique used Catheter size: 8.5 Fr Central line and PA cath was placed.Sheath introducer Swan type and PA catheter depth:thermodilution and 50PA Cath depth:50 Procedure performed using ultrasound guided technique. Attempts: 1 Following insertion, line sutured and dressing applied. Post procedure assessment: blood return through all ports, free fluid flow and no air. Patient tolerated the procedure well with no immediate complications.

## 2016-07-07 NOTE — OR Nursing (Signed)
Second call made to SICU charge nurse N. Zafiris, Therapist, sports.

## 2016-07-07 NOTE — OR Nursing (Signed)
[facility] SICU charge nurse N. Zafiris, RN given first call.

## 2016-07-07 NOTE — Progress Notes (Signed)
Patient ID: Katherine Hall, female   DOB: 1941-09-22, 75 y.o.   MRN: [id number]   SICU Evening Rounds:   Hemodynamically stable  CI = 1.9  Has started to wake up on vent.  Urine output good  CT output low  CBC    Component Value Date/Time   WBC 8.8 07/07/2016 1400   RBC 3.88 07/07/2016 1400   HGB 11.8 (L) 07/07/2016 1400   HCT 35.0 (L) 07/07/2016 1400   PLT 140 (L) 07/07/2016 1400   MCV 90.2 07/07/2016 1400   MCH 30.4 07/07/2016 1400   MCHC 33.7 07/07/2016 1400   RDW 14.7 07/07/2016 1400     BMET    Component Value Date/Time   NA 139 07/07/2016 1353   K 3.6 07/07/2016 1353   CL 98 (L) 07/07/2016 1232   CO2 27 07/07/2016 0335   GLUCOSE 89 07/07/2016 1353   BUN 4 (L) 07/07/2016 1232   CREATININE 0.30 (L) 07/07/2016 1232   CALCIUM 9.3 07/07/2016 0335   GFRNONAA >60 07/07/2016 0335   GFRAA >60 07/07/2016 0335     A/P:  Stable postop course. Continue current plans

## 2016-07-07 NOTE — Anesthesia Procedure Notes (Deleted)
Procedures

## 2016-07-07 NOTE — Transfer of Care (Signed)
Immediate Anesthesia Transfer of Care Note  Patient: Katherine Hall  Procedure(s) Performed: Procedure(s): CORONARY ARTERY BYPASS GRAFTING (CABG) x 5 with endoscopic harvesting of the right greater saphenous vein (N/A) INTRAOPERATIVE TRANSESOPHAGEAL ECHOCARDIOGRAM (N/A)  Patient Location: SICU  Anesthesia Type:General  Level of Consciousness: sedated and unresponsive  Airway & Oxygen Therapy: Patient remains intubated per anesthesia plan and Patient placed on Ventilator (see vital sign flow sheet for setting)  Post-op Assessment: Post -op Vital signs reviewed and stable  Post vital signs: Reviewed and stable  Last Vitals:  Vitals:   07/07/16 0500 07/07/16 0526  BP: 129/65 130/81  Pulse: 64 62  Resp:  16  Temp:  36.7 C    Last Pain:  Vitals:   07/07/16 0526  TempSrc: Oral  PainSc:       Patients Stated Pain Goal: 0 ([id number])  Complications: No apparent anesthesia complications

## 2016-07-07 NOTE — Procedures (Signed)
Extubation Procedure Note  Patient Details:   Name: Katherine Hall DOB: 1941-08-27 MRN: [id number]   Airway Documentation:  Airway 7.5 mm (Active)  Secured at (cm) 20 cm 07/07/2016  6:40 PM  Measured From Lips 07/07/2016  6:40 PM  Secured Location Right 07/07/2016  6:40 PM  Secured By Pink Tape 07/07/2016  6:40 PM  Site Condition Dry 07/07/2016  6:40 PM    Evaluation  O2 sats: stable throughout Complications: No apparent complications Patient did tolerate procedure well. Bilateral Breath Sounds: Clear   Yes  Extubation procedure was explained to patient, pt nodded her head for understanding. Pt was able to hold her head off the pillow for 5+seconds and give a tight hand squeeze.  Prior to extubation RT/RN auscultated lateral neck, patient had good aeration around the ETT. Post extubation, patient was able to state her name. Pt was titrated to a 4L [place] with bubble O2 humidity. Sats were stable throughout procedure no complications noted. Pt is stable at this time, RN at bedside.   Leigh Aurora, BS, RRT, RCP 07/07/2016, 7:37 PM

## 2016-07-07 NOTE — Anesthesia Procedure Notes (Signed)
Procedure Name: Intubation Date/Time: 07/07/2016 7:51 AM Performed by: Mariea Clonts Pre-anesthesia Checklist: Patient identified, Emergency Drugs available, Suction available and Patient being monitored Patient Re-evaluated:Patient Re-evaluated prior to inductionOxygen Delivery Method: Circle system utilized Preoxygenation: Pre-oxygenation with 100% oxygen Intubation Type: IV induction Ventilation: Mask ventilation without difficulty Laryngoscope Size: Miller and 2 Grade View: Grade I Tube type: Oral Tube size: 7.5 mm Number of attempts: 1 Airway Equipment and Method: Patient positioned with wedge pillow and Stylet Placement Confirmation: ETT inserted through vocal cords under direct vision,  positive ETCO2 and breath sounds checked- equal and bilateral Secured at: 21 cm Tube secured with: Tape Dental Injury: Teeth and Oropharynx as per pre-operative assessment  Comments: Placed by [place]

## 2016-07-07 NOTE — Brief Op Note (Signed)
07/03/2016 - 07/07/2016  10:59 AM  PATIENT:  Katherine Hall  75 y.o. female  PRE-OPERATIVE DIAGNOSIS:  CAD  POST-OPERATIVE DIAGNOSIS:  CAD  PROCEDURE:  Procedure(s):  CORONARY ARTERY BYPASS GRAFTING x 5 -LIMA to LAD -SVG to DIAGONAL -SVG to PDA -SEQ SVG OM1 and OM2   ENDOSCOPIC HARVEST GREATER SAPHENOUS VEIN -Right Leg  ECHOCARDIOGRAM (N/A)   INTRAOPERATIVE TRANSESOPHAGEAL   SURGEON:  Surgeon(s) and Role:    * Gaye Pollack, MD - Primary  PHYSICIAN ASSISTANT: Ellwood Handler PA-C, Nicholes Rough PA-C  ASSISTANTS: Ara Kussmaul RNFA   EBL:  Total I/O In: 1000 [I.V.:1000] Out: 100 [Urine:100]  BLOOD ADMINISTERED: CELLSAVER  DRAINS: Left Pleural Chest Tube, Mediastinal Chest Drain   LOCAL MEDICATIONS USED:  NONE  SPECIMEN:  No Specimen  DISPOSITION OF SPECIMEN:  N/A  COUNTS:  YES  TOURNIQUET:  * No tourniquets in log *  DICTATION: .Dragon Dictation  PLAN OF CARE: Admit to inpatient   PATIENT DISPOSITION:  ICU - intubated and hemodynamically stable.   Delay start of Pharmacological VTE agent (>24hrs) due to surgical blood loss or risk of bleeding: yes

## 2016-07-07 NOTE — Progress Notes (Signed)
  Echocardiogram Echocardiogram Transesophageal has been performed.  Darlina Sicilian M 07/07/2016, 12:14 PM

## 2016-07-07 NOTE — Op Note (Signed)
CARDIOVASCULAR SURGERY OPERATIVE NOTE  07/07/2016  Surgeon:  Gaye Pollack, MD  First Assistant: Nicholes Rough, PA-C and Ellwood Handler, PA-C   Preoperative Diagnosis:  Severe multi-vessel coronary artery disease   Postoperative Diagnosis:  Same   Procedure:  1. Median Sternotomy 2. Extracorporeal circulation 3.   Coronary artery bypass grafting x 5   Left internal mammary graft to the LAD  Sequential SVG to OM1 and OM2  SVG to diagonal  SVG to PDA 4.   Endoscopic vein harvest from the right leg   Anesthesia:  General Endotracheal   Clinical History/Surgical Indication:  She has a 3-6 month history of not feeling well with heartburn-type chest discomfort, exertional fatigue and tiredness and exertional shortness of breath. Lately she has not been able to walk down her inclined 110 ft driveway because she can't make it back up. On Saturday she developed aching and tightness in her chest while watching a ball game. The pain waxed an waned all day. She took and aspirin and went to bed. The next morning in church the pain recurred and she was concerned that it may be her heart so she went to [facility]. Troponin x 3 were negative. ECG was unremarkable. She had a very strong family history and hyperlipidemia with statin intolerance. She was transferred to [facility] and cath this am showed 70% LM and ostial LAD, 90% ostial LCX, 90% mid RCA and other scattered branch stenoses. Her EF is normal. She has relatively small distal vessels.  I agree that CABG is the best treatment for this patient. I have personally reviewed her cath and discussed the results with her and her husband. Her coronary disease is not amenable to PCI. I discussed the operative procedure with the patient and her husband including alternatives, benefits and risks; including but not limited to bleeding, blood transfusion, infection, stroke,  myocardial infarction, graft failure, heart block requiring a permanent pacemaker, organ dysfunction, and death.  [name] understands and agrees to proceed.   Preparation:  The patient was seen in the preoperative holding area and the correct patient, correct operation were confirmed with the patient after reviewing the medical record and catheterization. The consent was signed by me. Preoperative antibiotics were given. A pulmonary arterial line and radial arterial line were placed by the anesthesia team. The patient was taken back to the operating room and positioned supine on the operating room table. After being placed under general endotracheal anesthesia by the anesthesia team a foley catheter was placed. The neck, chest, abdomen, and both legs were prepped with betadine soap and solution and draped in the usual sterile manner. A surgical time-out was taken and the correct patient and operative procedure were confirmed with the nursing and anesthesia staff.   Cardiopulmonary Bypass:  A median sternotomy was performed using an oscillating saw without difficulty. The pericardium was opened in the midline. Right ventricular function appeared normal. The ascending aorta was of normal size and had no palpable plaque. There were no contraindications to aortic cannulation or cross-clamping. The patient was fully systemically heparinized and the ACT was maintained > 400 sec. The proximal aortic arch was cannulated with a 20 F aortic cannula for arterial inflow. Venous cannulation was performed via the right atrial appendage using a two-staged venous cannula. An antegrade cardioplegia/vent cannula was inserted into the mid-ascending aorta. Aortic occlusion was performed with a single cross-clamp. Systemic cooling to 32 degrees Centigrade and topical cooling of the heart with iced saline were used. Hyperkalemic antegrade cold  blood cardioplegia was used to induce diastolic arrest and was then given at  about 20 minute intervals throughout the period of arrest to maintain myocardial temperature at or below 10 degrees centigrade. A temperature probe was inserted into the interventricular septum and an insulating pad was placed in the pericardium.   Left internal mammary harvest:  The left side of the sternum was retracted using the Rultract retractor. The left internal mammary artery was harvested as a pedicle graft. All side branches were clipped. It was a medium-sized vessel of good quality with excellent blood flow. It was ligated distally and divided. It was sprayed with topical papaverine solution to prevent vasospasm.   Endoscopic vein harvest:  The right greater saphenous vein was harvested endoscopically through a 2 cm incision medial to the right knee. It was harvested from the upper thigh to below the knee. It was a medium-sized vein of good quality. The side branches were all ligated with 4-0 silk ties.    Coronary arteries:  The coronary arteries were examined.   LAD:  Mid and distal vessel were small but graftable with no disease. The diagonal was a medium sized vessel with segmental distal disease.  LCX:  OM1 was small but grafted high. The OM2 was slightly larger but diffusely diseased with plaque out in the small distal sub-branches.  RCA:  Diffusely diseased extending out to the mid PDA. The distal PDA was small but graftable.   Grafts:  1. LIMA to the LAD: 1.6 mm. It was sewn end to side using 8-0 prolene continuous suture. 2. SVG to diagonal:  1.6 mm. It was sewn end to side using 7-0 prolene continuous suture. 3. Sequential SVG to OM1:  1.5 mm. It was sewn sequential side to side using 8-0 prolene continuous suture. 4. Sequential SVG to OM2:  1.6 mm. It was sewn sequential end to side using 7-0 prolene continuous suture. 5. SVG to PDA:   1.5 mm. It was sewn end to side using 7-0 prolene continuous suture.   The proximal vein graft anastomoses were performed to  the mid-ascending aorta using continuous 6-0 prolene suture. Graft markers were placed around the proximal anastomoses.   Completion:  The patient was rewarmed to 37 degrees Centigrade. The clamp was removed from the LIMA pedicle and there was rapid warming of the septum and return of ventricular fibrillation. The crossclamp was removed with a time of 103 minutes. There was spontaneous return of sinus rhythm. The distal and proximal anastomoses were checked for hemostasis. The position of the grafts was satisfactory. Two temporary epicardial pacing wires were placed on the right atrium and two on the right ventricle. The patient was weaned from CPB without difficulty on no inotropes. CPB time was 121 minutes. Cardiac output was 4 LPM. Heparin was fully reversed with protamine and the aortic and venous cannulas removed. Hemostasis was achieved. Mediastinal and left pleural drainage tubes were placed. The sternum was closed with  #6 stainless steel wires. The fascia was closed with continuous # 1 vicryl suture. The subcutaneous tissue was closed with 2-0 vicryl continuous suture. The skin was closed with 3-0 vicryl subcuticular suture. All sponge, needle, and instrument counts were reported correct at the end of the case. Dry sterile dressings were placed over the incisions and around the chest tubes which were connected to pleurevac suction. The patient was then transported to the surgical intensive care unit in critical but stable condition.

## 2016-07-07 NOTE — Progress Notes (Signed)
Pt passed weaning criteria. Pt perform pulmonary mechanics with good effort. NIF -30 and FVC .85. Pt ABG was stable. RT and RN at bedside. No distress or complications noted.

## 2016-07-07 NOTE — Anesthesia Procedure Notes (Signed)
Anesthesia Procedure Image    

## 2016-07-08 ENCOUNTER — Inpatient Hospital Stay (HOSPITAL_COMMUNITY): Payer: Medicare Other

## 2016-07-08 ENCOUNTER — Encounter (HOSPITAL_COMMUNITY): Payer: Self-pay | Admitting: Surgery

## 2016-07-08 LAB — BASIC METABOLIC PANEL
Anion gap: 4 — ABNORMAL LOW (ref 5–15)
BUN: 7 mg/dL (ref 6–20)
CO2: 23 mmol/L (ref 22–32)
Calcium: 7.4 mg/dL — ABNORMAL LOW (ref 8.9–10.3)
Chloride: 109 mmol/L (ref 101–111)
Creatinine, Ser: 0.5 mg/dL (ref 0.44–1.00)
GFR calc Af Amer: >60 mL/min (ref >60)
GFR calc non Af Amer: >60 mL/min (ref >60)
Glucose, Bld: 89 mg/dL (ref 65–99)
Potassium: 3.4 mmol/L — ABNORMAL LOW (ref 3.5–5.1)
Sodium: 136 mmol/L (ref 135–145)

## 2016-07-08 LAB — GLUCOSE, CAPILLARY
Glucose-Capillary: 101 mg/dL — ABNORMAL HIGH (ref 65–99)
Glucose-Capillary: 110 mg/dL — ABNORMAL HIGH (ref 65–99)
Glucose-Capillary: 111 mg/dL — ABNORMAL HIGH (ref 65–99)
Glucose-Capillary: 117 mg/dL — ABNORMAL HIGH (ref 65–99)
Glucose-Capillary: 125 mg/dL — ABNORMAL HIGH (ref 65–99)
Glucose-Capillary: 130 mg/dL — ABNORMAL HIGH (ref 65–99)
Glucose-Capillary: 130 mg/dL — ABNORMAL HIGH (ref 65–99)
Glucose-Capillary: 133 mg/dL — ABNORMAL HIGH (ref 65–99)
Glucose-Capillary: 134 mg/dL — ABNORMAL HIGH (ref 65–99)
Glucose-Capillary: 134 mg/dL — ABNORMAL HIGH (ref 65–99)
Glucose-Capillary: 136 mg/dL — ABNORMAL HIGH (ref 65–99)
Glucose-Capillary: 142 mg/dL — ABNORMAL HIGH (ref 65–99)
Glucose-Capillary: 177 mg/dL — ABNORMAL HIGH (ref 65–99)
Glucose-Capillary: 199 mg/dL — ABNORMAL HIGH (ref 65–99)
Glucose-Capillary: 82 mg/dL (ref 65–99)
Glucose-Capillary: 85 mg/dL (ref 65–99)
Glucose-Capillary: 90 mg/dL (ref 65–99)
Glucose-Capillary: 94 mg/dL (ref 65–99)

## 2016-07-08 LAB — CBC
HCT: 27 % — ABNORMAL LOW (ref 36.0–46.0)
HCT: 25.4 % — ABNORMAL LOW (ref 36.0–46.0)
Hemoglobin: 9.1 g/dL — ABNORMAL LOW (ref 12.0–15.0)
Hemoglobin: 8.5 g/dL — ABNORMAL LOW (ref 12.0–15.0)
MCH: 29.9 pg (ref 26.0–34.0)
MCH: 29.8 pg (ref 26.0–34.0)
MCHC: 33.7 g/dL (ref 30.0–36.0)
MCHC: 33.5 g/dL (ref 30.0–36.0)
MCV: 88.8 fL (ref 78.0–100.0)
MCV: 89.1 fL (ref 78.0–100.0)
Platelets: 138 10*3/uL — ABNORMAL LOW (ref 150–400)
Platelets: 132 10*3/uL — ABNORMAL LOW (ref 150–400)
RBC: 3.04 MIL/uL — ABNORMAL LOW (ref 3.87–5.11)
RBC: 2.85 MIL/uL — ABNORMAL LOW (ref 3.87–5.11)
RDW: 15.6 % — ABNORMAL HIGH (ref 11.5–15.5)
RDW: 15.8 % — ABNORMAL HIGH (ref 11.5–15.5)
WBC: 6.8 10*3/uL (ref 4.0–10.5)
WBC: 7.5 10*3/uL (ref 4.0–10.5)

## 2016-07-08 LAB — MAGNESIUM
Magnesium: 2.6 mg/dL — ABNORMAL HIGH (ref 1.7–2.4)
Magnesium: 2.3 mg/dL (ref 1.7–2.4)

## 2016-07-08 LAB — CREATININE, SERUM
Creatinine, Ser: 0.62 mg/dL (ref 0.44–1.00)
GFR calc Af Amer: >60 mL/min (ref >60)
GFR calc non Af Amer: >60 mL/min (ref >60)

## 2016-07-08 LAB — POCT I-STAT, CHEM 8
BUN: 8 mg/dL (ref 6–20)
Calcium, Ion: 1.2 mmol/L (ref 1.12–1.23)
Chloride: 100 mmol/L — ABNORMAL LOW (ref 101–111)
Creatinine, Ser: 0.6 mg/dL (ref 0.44–1.00)
Glucose, Bld: 122 mg/dL — ABNORMAL HIGH (ref 65–99)
HCT: 23 % — ABNORMAL LOW (ref 36.0–46.0)
Hemoglobin: 7.8 g/dL — ABNORMAL LOW (ref 12.0–15.0)
Potassium: 4.3 mmol/L (ref 3.5–5.1)
Sodium: 136 mmol/L (ref 135–145)
TCO2: 23 mmol/L (ref 0–100)

## 2016-07-08 MED ORDER — INSULIN ASPART 100 UNIT/ML ~~LOC~~ SOLN: 0.0000 [IU] | SUBCUTANEOUS | Status: DC

## 2016-07-08 MED ORDER — MORPHINE SULFATE (PF) 2 MG/ML IV SOLN
2.0000 mg | INTRAVENOUS | Status: DC | PRN
  Administered 2016-07-08 – 2016-07-09 (×2): 2 mg via INTRAVENOUS
  Filled 2016-07-08 (×2): qty 1

## 2016-07-08 MED ORDER — POTASSIUM CHLORIDE 10 MEQ/50ML IV SOLN
10.0000 meq | INTRAVENOUS | Status: AC
Start: 2016-07-08 — End: 2016-07-08
  Administered 2016-07-08 (×2): 10 meq via INTRAVENOUS
  Filled 2016-07-08: qty 50

## 2016-07-08 MED ORDER — INSULIN ASPART 100 UNIT/ML ~~LOC~~ SOLN
0.0000 [IU] | SUBCUTANEOUS | Status: DC
  Administered 2016-07-08 – 2016-07-09 (×3): 2 [IU] via SUBCUTANEOUS

## 2016-07-08 MED ORDER — POTASSIUM CHLORIDE 10 MEQ/50ML IV SOLN
10.0000 meq | Freq: Once | INTRAVENOUS | Status: AC
  Administered 2016-07-08: 10 meq via INTRAVENOUS
  Filled 2016-07-08: qty 50

## 2016-07-08 MED ORDER — FUROSEMIDE 10 MG/ML IJ SOLN
40.0000 mg | Freq: Once | INTRAMUSCULAR | Status: AC
  Administered 2016-07-08: 40 mg via INTRAVENOUS
  Filled 2016-07-08: qty 4

## 2016-07-08 MED ORDER — POTASSIUM CHLORIDE 10 MEQ/50ML IV SOLN
10.0000 meq | INTRAVENOUS | Status: AC
  Administered 2016-07-08 (×3): 10 meq via INTRAVENOUS
  Filled 2016-07-08: qty 50

## 2016-07-08 NOTE — Care Management Important Message (Signed)
Important Message  Patient Details  Name: Katherine Hall MRN: [id number] Date of Birth: 26-May-1941   Medicare Important Message Given:  Yes    Kelleen Stolze Abena 07/08/2016, 10:50 AM

## 2016-07-08 NOTE — Progress Notes (Signed)
1 Day Post-Op Procedure(s) (LRB): CORONARY ARTERY BYPASS GRAFTING (CABG) x 5 with endoscopic harvesting of the right greater saphenous vein (N/A) INTRAOPERATIVE TRANSESOPHAGEAL ECHOCARDIOGRAM (N/A) Subjective: Sore. Vomited once this am.  Objective: Vital signs in last 24 hours: Temp:  [97 F (36.1 C)-99.3 F (37.4 C)] 99 F (37.2 C) (08/04 0645) Pulse Rate:  [84-91] 86 (08/04 0645) Cardiac Rhythm: Atrial paced (08/04 0600) Resp:  [11-29] 14 (08/04 0645) BP: (88-144)/(49-85) 108/62 (08/04 0600) SpO2:  [96 %-100 %] 99 % (08/04 0645) Arterial Line BP: (94-143)/(44-82) 101/49 (08/04 0645) FiO2 (%):  [40 %-50 %] 40 % (08/03 1840) Weight:  [63.2 kg (139 lb 5.3 oz)] 63.2 kg (139 lb 5.3 oz) (08/04 0600)  Hemodynamic parameters for last 24 hours: PAP: (16-30)/(5-20) 29/12 CO:  [2.3 L/min-4.5 L/min] 4.1 L/min CI:  [1.5 L/min/m2-3.1 L/min/m2] 2.8 L/min/m2  Intake/Output from previous day: 08/03 0701 - 08/04 0700 In: 5078.2 [I.V.:3269.2; Blood:339; NG/GT:30; IV Piggyback:1440] Out: [id number] [Urine:2655; Blood:800; Chest Tube:670] Intake/Output this shift: No intake/output data recorded.  General appearance: alert and cooperative Neurologic: intact Heart: regular rate and rhythm, S1, S2 normal, no murmur, click, rub or gallop Lungs: clear to auscultation bilaterally Extremities: edema mild Wound: dressings dry  Lab Results:  Recent Labs  07/07/16 2000 07/08/16 0454  WBC 9.5 6.8  HGB 12.2 9.1*  HCT 35.9* 27.0*  PLT 154 138*   BMET:  Recent Labs  07/07/16 0335  07/07/16 1947 07/07/16 2000 07/08/16 0454  NA 135  < > 139  --  136  K 3.4*  < > 3.9  --  3.4*  CL 102  < > 104  --  109  CO2 27  --   --   --  23  GLUCOSE 103*  < > 135*  --  89  BUN 8  < > 7  --  7  CREATININE 0.66  < > 0.40* 0.52 0.50  CALCIUM 9.3  --   --   --  7.4*  < > = values in this interval not displayed.  PT/INR:  Recent Labs  07/07/16 1400  LABPROT 19.0*  INR 1.58   ABG    Component Value  Date/Time   PHART 7.378 07/07/2016 2039   HCO3 24.2 (H) 07/07/2016 2039   TCO2 25 07/07/2016 2039   ACIDBASEDEF 1.0 07/07/2016 2039   O2SAT 98.0 07/07/2016 2039   CBG (last 3)   Recent Labs  07/08/16 0322 07/08/16 0437 07/08/16 0540  GLUCAP 90 110* 85   CXR: ok  ECG: sinus, no acute changes  Assessment/Plan: S/P Procedure(s) (LRB): CORONARY ARTERY BYPASS GRAFTING (CABG) x 5 with endoscopic harvesting of the right greater saphenous vein (N/A) INTRAOPERATIVE TRANSESOPHAGEAL ECHOCARDIOGRAM (N/A)  She is hemodynamically stable in sinus rhythm. Mobilize Diuresis d/c tubes/lines Continue foley due to diuresing patient and patient in ICU See progression orders   LOS: 3 days    Gaye Pollack 07/08/2016

## 2016-07-08 NOTE — Anesthesia Postprocedure Evaluation (Signed)
Anesthesia Post Note  Patient: Katherine Hall  Procedure(s) Performed: Procedure(s) (LRB): CORONARY ARTERY BYPASS GRAFTING (CABG) x 5 with endoscopic harvesting of the right greater saphenous vein (N/A) INTRAOPERATIVE TRANSESOPHAGEAL ECHOCARDIOGRAM (N/A)  Patient location during evaluation: SICU Anesthesia Type: General Level of consciousness: sedated and patient remains intubated per anesthesia plan Pain management: pain level controlled Vital Signs Assessment: post-procedure vital signs reviewed and stable Respiratory status: patient remains intubated per anesthesia plan and patient on ventilator - see flowsheet for VS Cardiovascular status: stable Anesthetic complications: no    Last Vitals:  Vitals:   07/08/16 0645 07/08/16 0800  BP:  (!) 95/54  Pulse: 86 86  Resp: 14 (!) 22  Temp: 37.2 C 37.3 C    Last Pain:  Vitals:   07/08/16 0800  TempSrc:   PainSc: 4                  Catalina Gravel

## 2016-07-08 NOTE — Care Management Note (Signed)
Case Management Note  Patient Details  Name: Katherine Hall MRN: [id number] Date of Birth: [birth year]  Subjective/Objective:  Pt presented for chest pain. S/P Cardiac cath 8/1 showed severe triple-vessel CAD - Cardiothoracic surgery consulted, plan is for CABG on 07/07/16, continue heparin for now.                    Action/Plan: Pt is from home with support of husband. Pt has DME: RW and shower chair. CM will continue to monitor post procedure for home needs.   Expected Discharge Date:                  Expected Discharge Plan:  [place]  In-House Referral:     Discharge planning Services  CM Consult  Post Acute Care Choice:    Choice offered to:     DME Arranged:    DME Agency:     HH Arranged:    [place] Agency:     Status of Service:  In process, will continue to follow  If discussed at Long Length of Stay Meetings, dates discussed:    Additional Comments: 07/08/2016 Pt from home with husband.  Husband will provide recommended supervision at discharge.  CM will continue to follow for discharge needs Maryclare Labrador, RN 07/08/2016, 2:42 PM

## 2016-07-08 NOTE — Progress Notes (Signed)
PROGRESS NOTE  Subjective:   75 y.o. female with history of HTN, HLD, arthritis, anemia, stomach ulcer 2009, striking family history of CAD, and remote tennis ball-sized cyst removed from heart who presented to [facility] upon transfer from [facility] with chest pain  Was found to have significant 3 V CAD  Had CABG on 8/3 Some vomiting overnight  But otherwise is doing well   Objective:    Vital Signs:   Temp:  [97 F (36.1 C)-99.3 F (37.4 C)] 97.9 F (36.6 C) (08/04 1100) Pulse Rate:  [71-91] 71 (08/04 1200) Resp:  [11-29] 13 (08/04 1200) BP: (88-144)/(49-85) 102/50 (08/04 1200) SpO2:  [96 %-100 %] 100 % (08/04 1200) Arterial Line BP: (94-143)/(42-82) 104/42 (08/04 0900) FiO2 (%):  [40 %-50 %] 40 % (08/03 1840) Weight:  [139 lb 5.3 oz (63.2 kg)] 139 lb 5.3 oz (63.2 kg) (08/04 0600)  Last BM Date: 07/04/16   24-hour weight change: Weight change: 21 lb 14.9 oz (9.948 kg)  Weight trends: Filed Weights   07/06/16 0500 07/07/16 0526 07/08/16 0600  Weight: 119 lb 12.8 oz (54.3 kg) 117 lb 6.4 oz (53.3 kg) 139 lb 5.3 oz (63.2 kg)    Intake/Output:  08/03 0701 - 08/04 0700 In: 5078.2 [I.V.:3269.2; Blood:339; NG/GT:30; IV Piggyback:1440] Out: 8546 [Urine:2655; Blood:800; Chest Tube:670] Total I/O In: 400 [I.V.:100; IV Piggyback:300] Out: 945 [Urine:865; Chest Tube:80]   Physical Exam: BP (!) 102/50   Pulse 71   Temp 97.9 F (36.6 C) (Oral)   Resp 13   Ht '4\' 10"'  (1.473 m)   Wt 139 lb 5.3 oz (63.2 kg)   SpO2 100%   BMI 29.12 kg/m   Wt Readings from Last 3 Encounters:  07/08/16 139 lb 5.3 oz (63.2 kg)    General: Vital signs reviewed and noted.   Head: Normocephalic, atraumatic.  Eyes: conjunctivae/corneas clear.  EOM's intact.   Throat: normal  Neck:  normal   Lungs:    clear   Heart:  RR , ? Soft rub   Abdomen:  Soft, non-tender, non-distended    Extremities: No edema    Neurologic: A&O X3, CN II - XII are grossly intact.   Psych: Normal      Labs: BMET:  Recent Labs  07/07/16 0335  07/07/16 1947 07/07/16 2000 07/08/16 0454  NA 135  < > 139  --  136  K 3.4*  < > 3.9  --  3.4*  CL 102  < > 104  --  109  CO2 27  --   --   --  23  GLUCOSE 103*  < > 135*  --  89  BUN 8  < > 7  --  7  CREATININE 0.66  < > 0.40* 0.52 0.50  CALCIUM 9.3  --   --   --  7.4*  MG  --   --   --  3.3* 2.6*  < > = values in this interval not displayed.  Liver function tests:  Recent Labs  07/07/16 0335  AST 17  ALT 15  ALKPHOS 49  BILITOT 0.2*  PROT 5.8*  ALBUMIN 3.6   No results for input(s): LIPASE, AMYLASE in the last 72 hours.  CBC:  Recent Labs  07/07/16 2000 07/08/16 0454  WBC 9.5 6.8  HGB 12.2 9.1*  HCT 35.9* 27.0*  MCV 88.9 88.8  PLT 154 138*    Cardiac Enzymes: No results for input(s): CKTOTAL, CKMB, TROPONINI in the last 72  hours.  Coagulation Studies:  Recent Labs  07/07/16 1400  LABPROT 19.0*  INR 1.58    Other: Invalid input(s): POCBNP No results for input(s): DDIMER in the last 72 hours.  Recent Labs  07/06/16 1845  HGBA1C 5.3   No results for input(s): CHOL, HDL, LDLCALC, TRIG, CHOLHDL in the last 72 hours. No results for input(s): TSH, T4TOTAL, T3FREE, THYROIDAB in the last 72 hours.  Invalid input(s): FREET3 No results for input(s): VITAMINB12, FOLATE, FERRITIN, TIBC, IRON, RETICCTPCT in the last 72 hours.   Other results:    ( personally reviewed )  -tele - NSR   Medications:    Infusions: . sodium chloride 20 mL/hr at 07/07/16 1400  . sodium chloride    . sodium chloride 20 mL/hr at 07/08/16 0800  . insulin (NOVOLIN-R) infusion 0.5 Units/hr (07/08/16 0435)  . lactated ringers 20 mL/hr at 07/08/16 1114  . lactated ringers 20 mL/hr at 07/07/16 1415    Scheduled Medications: . acetaminophen  1,000 mg Oral Q6H   Or  . acetaminophen (TYLENOL) oral liquid 160 mg/5 mL  1,000 mg Per Tube Q6H  . antiseptic oral rinse  7 mL Mouth Rinse QID  . aspirin EC  325 mg Oral Daily    Or  . aspirin  324 mg Per Tube Daily  . bisacodyl  10 mg Oral Daily   Or  . bisacodyl  10 mg Rectal Daily  . chlorhexidine gluconate (SAGE KIT)  15 mL Mouth Rinse BID  . docusate sodium  200 mg Oral Daily  . famotidine (PEPCID) IV  20 mg Intravenous Q24H  . insulin aspart  0-24 Units Subcutaneous Q4H  . insulin aspart  0-24 Units Subcutaneous Q4H  . levothyroxine  50 mcg Oral QAC breakfast  . metoCLOPramide (REGLAN) injection  10 mg Intravenous Q6H  . metoprolol tartrate  12.5 mg Oral BID   Or  . metoprolol tartrate  12.5 mg Per Tube BID  . [START ON 07/09/2016] pantoprazole  40 mg Oral Daily  . sodium chloride flush  3 mL Intravenous Q12H    Assessment/ Plan:   Principal Problem:   Unstable angina (HCC) Active Problems:   Hypothyroidism   HTN (hypertension)   Hyperlipidemia   Anemia, unspecified   Hyponatremia   Pain in the chest   Chest pain   S/P CABG x 5  1. CAD - s/p CABG, having some nausea but otherwise is doing well  2. HTN   :   Well controlled      Disposition:  Length of Stay: 3  Thayer Headings, Brooke Bonito., MD, [facility] 07/08/2016, 12:27 PM Office [phone] Pager [phone]

## 2016-07-08 NOTE — Progress Notes (Signed)
TCTS BRIEF SICU PROGRESS NOTE  1 Day Post-Op  S/P Procedure(s) (LRB): CORONARY ARTERY BYPASS GRAFTING (CABG) x 5 with endoscopic harvesting of the right greater saphenous vein (N/A) INTRAOPERATIVE TRANSESOPHAGEAL ECHOCARDIOGRAM (N/A)   Stable day NSR w/ PAC's BP stable no drips Breathing comfortably w/ O2 sats 98-100% on 1 L/min UOP adequate  Plan: Continue current plan  Rexene Alberts, MD 07/08/2016 4:39 PM

## 2016-07-09 ENCOUNTER — Inpatient Hospital Stay (HOSPITAL_COMMUNITY): Payer: Medicare Other

## 2016-07-09 LAB — CBC
HCT: 25 % — ABNORMAL LOW (ref 36.0–46.0)
Hemoglobin: 8.3 g/dL — ABNORMAL LOW (ref 12.0–15.0)
MCH: 30.3 pg (ref 26.0–34.0)
MCHC: 33.2 g/dL (ref 30.0–36.0)
MCV: 91.2 fL (ref 78.0–100.0)
Platelets: 129 10*3/uL — ABNORMAL LOW (ref 150–400)
RBC: 2.74 MIL/uL — ABNORMAL LOW (ref 3.87–5.11)
RDW: 16 % — ABNORMAL HIGH (ref 11.5–15.5)
WBC: 8.9 10*3/uL (ref 4.0–10.5)

## 2016-07-09 LAB — BASIC METABOLIC PANEL
Anion gap: 8 (ref 5–15)
BUN: 9 mg/dL (ref 6–20)
CO2: 23 mmol/L (ref 22–32)
Calcium: 8.5 mg/dL — ABNORMAL LOW (ref 8.9–10.3)
Chloride: 102 mmol/L (ref 101–111)
Creatinine, Ser: 0.62 mg/dL (ref 0.44–1.00)
GFR calc Af Amer: >60 mL/min (ref >60)
GFR calc non Af Amer: >60 mL/min (ref >60)
Glucose, Bld: 130 mg/dL — ABNORMAL HIGH (ref 65–99)
Potassium: 4.3 mmol/L (ref 3.5–5.1)
Sodium: 133 mmol/L — ABNORMAL LOW (ref 135–145)

## 2016-07-09 LAB — GLUCOSE, CAPILLARY
Glucose-Capillary: 100 mg/dL — ABNORMAL HIGH (ref 65–99)
Glucose-Capillary: 130 mg/dL — ABNORMAL HIGH (ref 65–99)
Glucose-Capillary: 111 mg/dL — ABNORMAL HIGH (ref 65–99)

## 2016-07-09 MED ORDER — TRAMADOL HCL 50 MG PO TABS
50.0000 mg | ORAL_TABLET | Freq: Four times a day (QID) | ORAL | Status: DC | PRN
  Administered 2016-07-09 – 2016-07-12 (×6): 50 mg via ORAL
  Filled 2016-07-09 (×6): qty 1

## 2016-07-09 MED ORDER — LEVOTHYROXINE SODIUM 50 MCG PO TABS
50.0000 ug | ORAL_TABLET | Freq: Every day | ORAL | Status: DC
Start: 2016-07-10 — End: 2016-07-13
  Administered 2016-07-10 – 2016-07-13 (×4): 50 ug via ORAL
  Filled 2016-07-09 (×4): qty 1

## 2016-07-09 MED ORDER — OXYCODONE HCL 5 MG PO TABS
5.0000 mg | ORAL_TABLET | ORAL | Status: DC | PRN
  Administered 2016-07-09 – 2016-07-13 (×8): 5 mg via ORAL
  Filled 2016-07-09 (×8): qty 1

## 2016-07-09 MED ORDER — POTASSIUM CHLORIDE CRYS ER 20 MEQ PO TBCR
20.0000 meq | EXTENDED_RELEASE_TABLET | Freq: Every day | ORAL | Status: DC
Start: 2016-07-10 — End: 2016-07-12
  Administered 2016-07-10 – 2016-07-11 (×2): 20 meq via ORAL
  Filled 2016-07-09 (×2): qty 1

## 2016-07-09 MED ORDER — POLYSACCHARIDE IRON COMPLEX 150 MG PO CAPS
150.0000 mg | ORAL_CAPSULE | Freq: Every day | ORAL | Status: DC
  Administered 2016-07-10 – 2016-07-13 (×4): 150 mg via ORAL
  Filled 2016-07-09 (×4): qty 1

## 2016-07-09 MED ORDER — FA-PYRIDOXINE-CYANOCOBALAMIN 2.5-25-2 MG PO TABS
1.0000 | ORAL_TABLET | Freq: Every day | ORAL | Status: DC
  Administered 2016-07-10 – 2016-07-13 (×4): 1 via ORAL
  Filled 2016-07-09 (×4): qty 1

## 2016-07-09 MED ORDER — SODIUM CHLORIDE 0.9 % IV SOLN: 250.0000 mL | INTRAVENOUS | Status: DC | PRN

## 2016-07-09 MED ORDER — MOVING RIGHT ALONG BOOK
Freq: Once | Status: AC
  Administered 2016-07-09: 1
  Filled 2016-07-09: qty 1

## 2016-07-09 MED ORDER — PANTOPRAZOLE SODIUM 40 MG PO TBEC
40.0000 mg | DELAYED_RELEASE_TABLET | Freq: Every day | ORAL | Status: DC
  Administered 2016-07-10 – 2016-07-13 (×4): 40 mg via ORAL
  Filled 2016-07-09 (×4): qty 1

## 2016-07-09 MED ORDER — SODIUM CHLORIDE 0.9% FLUSH
3.0000 mL | Freq: Two times a day (BID) | INTRAVENOUS | Status: DC
  Administered 2016-07-10 – 2016-07-13 (×4): 3 mL via INTRAVENOUS

## 2016-07-09 MED ORDER — FUROSEMIDE 40 MG PO TABS
40.0000 mg | ORAL_TABLET | Freq: Every day | ORAL | Status: DC
  Administered 2016-07-09 – 2016-07-11 (×3): 40 mg via ORAL
  Filled 2016-07-09 (×3): qty 1

## 2016-07-09 MED ORDER — ENOXAPARIN SODIUM 30 MG/0.3ML ~~LOC~~ SOLN
30.0000 mg | SUBCUTANEOUS | Status: DC
  Administered 2016-07-10: 30 mg via SUBCUTANEOUS
  Filled 2016-07-09: qty 0.3

## 2016-07-09 MED ORDER — SODIUM CHLORIDE 0.9% FLUSH: 3.0000 mL | INTRAVENOUS | Status: DC | PRN

## 2016-07-09 NOTE — Progress Notes (Addendum)
      [address]       [place] [address]             [phone]        CARDIOTHORACIC SURGERY PROGRESS NOTE   R2 Days Post-Op Procedure(s) (LRB): CORONARY ARTERY BYPASS GRAFTING (CABG) x 5 with endoscopic harvesting of the right greater saphenous vein (N/A) INTRAOPERATIVE TRANSESOPHAGEAL ECHOCARDIOGRAM (N/A)  Subjective: Mild soreness in chest and back.  No nausea.  Looks weak but overall doing well.  Objective: Vital signs: BP Readings from Last 1 Encounters:  07/09/16 114/61   Pulse Readings from Last 1 Encounters:  07/09/16 79   Resp Readings from Last 1 Encounters:  07/09/16 20   Temp Readings from Last 1 Encounters:  07/09/16 97.4 F (36.3 C) (Oral)    Hemodynamics:    Physical Exam:  Rhythm:   sinus  Breath sounds: clear  Heart sounds:  RRR  Incisions:  Clean and dry  Abdomen:  Soft, non-distended, non-tender  Extremities:  Warm, well-perfused    Intake/Output from previous day: 08/04 0701 - 08/05 0700 In: 810 [I.V.:460; IV Piggyback:350] Out: 1475 [Urine:1395; Chest Tube:80] Intake/Output this shift: Total I/O In: 400 [P.O.:360; I.V.:40] Out: 60 [Urine:60]  Lab Results:  CBC: Recent Labs  07/08/16 1700 07/08/16 1755 07/09/16 0421  WBC 7.5  --  8.9  HGB 8.5* 7.8* 8.3*  HCT 25.4* 23.0* 25.0*  PLT 132*  --  129*    BMET:  Recent Labs  07/08/16 0454  07/08/16 1755 07/09/16 0421  NA 136  --  136 133*  K 3.4*  --  4.3 4.3  CL 109  --  100* 102  CO2 23  --   --  23  GLUCOSE 89  --  122* 130*  BUN 7  --  8 9  CREATININE 0.50  < > 0.60 0.62  CALCIUM 7.4*  --   --  8.5*  < > = values in this interval not displayed.   PT/INR:   Recent Labs  07/07/16 1400  LABPROT 19.0*  INR 1.58    CBG (last 3)   Recent Labs  07/08/16 2306 07/09/16 0321 07/09/16 0731  GLUCAP 100* 130* 111*    ABG    Component Value Date/Time   PHART 7.378 07/07/2016 2039   PCO2ART 41.0 07/07/2016 2039   PO2ART 112.0 (H)  07/07/2016 2039   HCO3 24.2 (H) 07/07/2016 2039   TCO2 23 07/08/2016 1755   ACIDBASEDEF 1.0 07/07/2016 2039   O2SAT 98.0 07/07/2016 2039    CXR: PORTABLE CHEST 1 VIEW  COMPARISON:  07/08/2016  FINDINGS: Removal of mediastinal drain and LEFT chest tubes. No pneumothorax. Retraction of Swan-Ganz catheter. Normal cardiac silhouette. Lungs are clear. No pneumothorax.  IMPRESSION: 1. Lungs are clear. 2. No pneumothorax following chest tube removal.   Electronically Signed   By: Suzy Bouchard M.D.   On: 07/09/2016 07:39  Assessment/Plan: S/P Procedure(s) (LRB): CORONARY ARTERY BYPASS GRAFTING (CABG) x 5 with endoscopic harvesting of the right greater saphenous vein (N/A) INTRAOPERATIVE TRANSESOPHAGEAL ECHOCARDIOGRAM (N/A)  Overall doing well POD2 Maintaining NSR w/ stable BP Breathing comfortably w/ O2 sats 99-100% on 1 L/min Expected post op acute blood loss anemia, Hgb stable 8.3 Expected post op atelectasis, very mild Expected post op volume excess, mild, diuresing Generalized weakness   Mobilize  Consider PT consult depending on progress  Watch anemia  Transfer step down  Rexene Alberts, MD 07/09/2016 9:46 AM

## 2016-07-10 ENCOUNTER — Inpatient Hospital Stay (HOSPITAL_COMMUNITY): Payer: Medicare Other

## 2016-07-10 LAB — TYPE AND SCREEN
ABO/RH(D): A POS
Antibody Screen: NEGATIVE
Unit division: 0
Unit division: 0
Unit division: 0
Unit division: 0
Unit division: 0
Unit division: 0

## 2016-07-10 LAB — BASIC METABOLIC PANEL
Anion gap: 6 (ref 5–15)
BUN: 11 mg/dL (ref 6–20)
CO2: 25 mmol/L (ref 22–32)
Calcium: 8.4 mg/dL — ABNORMAL LOW (ref 8.9–10.3)
Chloride: 102 mmol/L (ref 101–111)
Creatinine, Ser: 0.62 mg/dL (ref 0.44–1.00)
GFR calc Af Amer: >60 mL/min (ref >60)
GFR calc non Af Amer: >60 mL/min (ref >60)
Glucose, Bld: 128 mg/dL — ABNORMAL HIGH (ref 65–99)
Potassium: 3.9 mmol/L (ref 3.5–5.1)
Sodium: 133 mmol/L — ABNORMAL LOW (ref 135–145)

## 2016-07-10 LAB — CBC
HCT: 24.5 % — ABNORMAL LOW (ref 36.0–46.0)
Hemoglobin: 8.1 g/dL — ABNORMAL LOW (ref 12.0–15.0)
MCH: 30.6 pg (ref 26.0–34.0)
MCHC: 33.1 g/dL (ref 30.0–36.0)
MCV: 92.5 fL (ref 78.0–100.0)
Platelets: 176 10*3/uL (ref 150–400)
RBC: 2.65 MIL/uL — ABNORMAL LOW (ref 3.87–5.11)
RDW: 15.3 % (ref 11.5–15.5)
WBC: 8.7 10*3/uL (ref 4.0–10.5)

## 2016-07-10 MED ORDER — POTASSIUM CHLORIDE CRYS ER 20 MEQ PO TBCR
20.0000 meq | EXTENDED_RELEASE_TABLET | Freq: Once | ORAL | Status: AC
  Administered 2016-07-10: 20 meq via ORAL
  Filled 2016-07-10: qty 1

## 2016-07-10 NOTE — Progress Notes (Addendum)
      [address]       [place] [address]             [phone]        3 Days Post-Op Procedure(s) (LRB): CORONARY ARTERY BYPASS GRAFTING (CABG) x 5 with endoscopic harvesting of the right greater saphenous vein (N/A) INTRAOPERATIVE TRANSESOPHAGEAL ECHOCARDIOGRAM (N/A)  Subjective: Patient not feeling well today. Mostly incisional sternal pain.  Objective: Vital signs in last 24 hours: Temp:  [97.7 F (36.5 C)-98.5 F (36.9 C)] 98.1 F (36.7 C) (08/06 0445) Pulse Rate:  [74-83] 80 (08/06 0445) Cardiac Rhythm: Normal sinus rhythm (08/06 0720) Resp:  [16-21] 17 (08/06 0445) BP: (114-136)/(61-71) 125/61 (08/06 0445) SpO2:  [91 %-100 %] 91 % (08/06 0445) Weight:  [126 lb 12.8 oz (57.5 kg)] 126 lb 12.8 oz (57.5 kg) (08/06 0445)  Pre op weight 53 kg Current Weight  07/10/16 126 lb 12.8 oz (57.5 kg)      Intake/Output from previous day: 08/05 0701 - 08/06 0700 In: 1000 [P.O.:960; I.V.:40] Out: 1060 [Urine:1060]   Physical Exam:  Cardiovascular: RRR Pulmonary: Slightly diminished at bases Abdomen: Soft, non tender, bowel sounds present. Extremities: Mild bilateral lower extremity edema. Wounds: RLE wounds are clean and dry.  No erythema or signs of infection. Sternal dressing removed (old blood) and there were two superficial skin areas with light bloody ooze. No sign of infection.  Lab Results: CBC: Recent Labs  07/09/16 0421 07/10/16 0618  WBC 8.9 8.7  HGB 8.3* 8.1*  HCT 25.0* 24.5*  PLT 129* 176   BMET:  Recent Labs  07/09/16 0421 07/10/16 0300  NA 133* 133*  K 4.3 3.9  CL 102 102  CO2 23 25  GLUCOSE 130* 128*  BUN 9 11  CREATININE 0.62 0.62  CALCIUM 8.5* 8.4*    PT/INR:  Lab Results  Component Value Date   INR 1.58 07/07/2016   INR 0.81 07/03/2016   ABG:  INR: Will add last result for INR, ABG once components are confirmed Will add last 4 CBG results once components are confirmed  Assessment/Plan:  1. CV - SR in  the 80's. On Lopressor 12.5 mg bid. Will start low dose ACE when BP allows. 2.  Pulmonary - On room air. CXR this am appears to show no pneumothorax and small bilateral pleural effusions. Encourage incentive spirometer. 3. Volume Overload - On Lasix 40 mg daily. 4.  Acute blood loss anemia - H and H 8.1 and 24.5. Continue Niferex. 5. Thrombocytopenia resolved-platelets up to 176,000 6. Supplement potassium 7. Remove EPW in am  ZIMMERMAN,DONIELLE MPA-C 07/10/2016,8:09 AM  I have seen and examined the patient and agree with the assessment and plan as outlined.  Slow but steady progress.  Needs encouragement.  Will get PT consult.  Rexene Alberts, MD 07/10/2016 11:08 AM

## 2016-07-10 NOTE — Discharge Summary (Signed)
Physician Discharge Summary       [address]       [place] [address]             [phone]    Patient ID: Katherine Hall MRN: [id number] DOB/AGE: Mar 23, 1941 75 y.o.  Admit date: 07/03/2016 Discharge date: 07/13/2016  Admission Diagnoses: 1. Unstable angina ([place]) 2. Multivessel CAD  Active Diagnoses:  1.HTN (hypertension) 2. Hyperlipidemia 3. Hypothyroidism 4. Anemia 5. Gastric ulcer  Procedure (s):  Left Heart Cath and Coronary Angiography by Dr. Angelena Form on 07/05/2016:  Conclusion     Mid RCA lesion, 90 %stenosed.  Ost RPDA to RPDA lesion, 70 %stenosed.  Ost 1st Mrg lesion, 80 %stenosed.  Ost Cx to Prox Cx lesion, 90 %stenosed.  Ost LM to LM lesion, 70 %stenosed.  Ost 2nd Mrg to 2nd Mrg lesion, 70 %stenosed.  Ost LAD to Prox LAD lesion, 70 %stenosed.  Lat 2nd Mrg lesion, 99 %stenosed.  Mid LAD lesion, 50 %stenosed.  The left ventricular systolic function is normal.  LV end diastolic pressure is normal.  The left ventricular ejection fraction is greater than 65% by visual estimate.   1. Severe triple vessel CAD 2. Severe stenosis involving the left main artery, ostial Circumflex and ostial/proximal LAD. This is best seen in the caudal view.  3. Severe stenosis mid RCA, PDA and obtuse marginal branch of the Circumflex.  4. Normal LV systolic function.  5. Unstable angina    1. Median Sternotomy 2. Extracorporeal circulation 3.   Coronary artery bypass grafting x 5   Left internal mammary graft to the LAD  Sequential SVG to OM1 and OM2  SVG to diagonal  SVG to PDA 4.   Endoscopic vein harvest from the right leg by Dr. Cyndia Bent on 07/07/2016.  History of Presenting Illness: She has a 3-6 month history of not feeling well with heartburn-type chest discomfort, exertional fatigue and tiredness and exertional shortness of breath. Lately she has not been able to walk down her inclined 110 ft driveway because she can't make it back  up. On Saturday she developed aching and tightness in her chest while watching a ball game. The pain waxed an waned all day. She took and aspirin and went to bed. The next morning in church the pain recurred and she was concerned that it may be her heart so she went to [facility] Troponin x 3 were negative. ECG was unremarkable. She had a very strong family history and hyperlipidemia with statin intolerance. She was transferred to [facility] and cath on 07/07/2016 showed 70% LM and ostial LAD, 90% ostial LCX, 90% mid RCA and other scattered branch stenoses. Her EF is normal. She has relatively small distal vessels.  A cardiothoracic consultation was obtained with Dr. Cyndia Bent for the consideration of coronary artery bypass grafting surgery. Potential risks, benefits, and complications of the surgery were discussed with the patient and she agreed to proceed with surgery. Pre operative carotid duplex showed no significant internal carotid artery stenosis bilaterally. She underwent a CABG x 5 on 07/07/2016.   Brief Hospital Course:  The patient was extubated the evening of surgery without difficulty. She remained afebrile and hemodynamically stable. Gordy Councilman, a line, chest tubes, and foley were removed early in the post operative course. Lopressor was started and titrated accordingly. She was volume over loaded and diuresed. She had ABL anemia. She did not require a post op transfusion. Her last H and H was 8.1 and 24.5.She was on Niferex. She  was weaned off the insulin drip. The patient's HGA1C pre op was 5.3. The patient was felt surgically stable for transfer from the ICU to [facility] for further convalescence on 07/09/2016. She continues to progress with cardiac rehab. She was ambulating on room air. She has been tolerating a diet and has had a bowel movement. Epicardial pacing wires will be removed on 08/07. A PT consult was obtained and recommended home PT. Her HR did increase into the low 100's on 08/09 and her SBP was in  the 120-140's. As a result, her Lopressor was increased to 37.5 mg bid and her Lisinopril was increased to 10 mg daily.Chest tube sutures will be removed in the office after discharge. The patient is felt surgically stable for discharge today.   Latest Vital Signs: Blood pressure 133/72, pulse (!) 104, temperature 98 F (36.7 C), temperature source Oral, resp. rate 18, height 4\' 10"  (1.473 m), weight 121 lb 11.2 oz (55.2 kg), SpO2 99 %.  Physical Exam: Cardiovascular: RRR Pulmonary: Slightly diminished at bases Abdomen: Soft, non tender, bowel sounds present. Extremities: Mild bilateral lower extremity edema. Wounds: RLE wounds are clean and dry.  No erythema or signs of infection. Sternal dressing removed (old blood) and there were two superficial skin areas with light bloody ooze. No sign of infection.  Discharge Condition:Stable and discharged to home.  Recent laboratory studies:  Lab Results  Component Value Date   WBC 8.7 07/10/2016   HGB 8.1 (L) 07/10/2016   HCT 24.5 (L) 07/10/2016   MCV 92.5 07/10/2016   PLT 176 07/10/2016   Lab Results  Component Value Date   NA 133 (L) 07/10/2016   K 3.9 07/10/2016   CL 102 07/10/2016   CO2 25 07/10/2016   CREATININE 0.62 07/10/2016   GLUCOSE 128 (H) 07/10/2016    Diagnostic Studies: Dg Chest 2 View  COMPARISON:  Chest x-ray 07/10/2016.  FINDINGS: Lung volumes remain slightly low. Bibasilar opacities are most compatible with areas of passive subsegmental atelectasis, with superimposed small bilateral pleural effusions (right greater than left), which appear slightly increased compared with yesterday's examination. Some cephalization of the pulmonary vasculature is noted, without frank pulmonary edema. Mild cardiomegaly. Upper mediastinal contours are within normal limits. Aortic atherosclerosis. Status post median sternotomy for CABG. Epicardial pacing wires are noted.  IMPRESSION: 1. Postoperative changes and support  apparatus, as above. 2. Slight interval increase in size of small bilateral (right greater than left) pleural effusions. 3. Persistent low lung volumes with persistent bibasilar subsegmental atelectasis. 4. Aortic atherosclerosis.   Electronically Signed   By: Vinnie Langton M.D.   On: 07/11/2016 08:36  Discharge Medications:   Medication List    STOP taking these medications   amLODipine 5 MG tablet Commonly known as:  NORVASC   propranolol 20 MG tablet Commonly known as:  INDERAL     TAKE these medications   acyclovir 400 MG tablet Commonly known as:  ZOVIRAX Per patient, when she has an outbreak, takes two orally for one day What changed:  how much to take  how to take this  when to take this  additional instructions   aspirin 325 MG EC tablet Take 1 tablet (325 mg total) by mouth daily.   folic acid-pyridoxine-cyancobalamin 2.5-25-2 MG Tabs tablet Commonly known as:  FOLTX Take 1 tablet by mouth daily. For one month then stop.   furosemide 40 MG tablet Commonly known as:  LASIX Take 1 tablet (40 mg total) by mouth daily. For one week then  stop.   iron polysaccharides 150 MG capsule Commonly known as:  NIFEREX Take 1 capsule (150 mg total) by mouth daily. For one month then stop.   levothyroxine 50 MCG tablet Commonly known as:  SYNTHROID, LEVOTHROID Take 50 mcg by mouth daily.   lisinopril 10 MG tablet Commonly known as:  PRINIVIL,ZESTRIL Take 1 tablet (10 mg total) by mouth daily. What changed:  medication strength  how much to take   Metoprolol Tartrate 37.5 MG Tabs Take 37.5 mg by mouth 2 (two) times daily.   pantoprazole 40 MG tablet Commonly known as:  PROTONIX Take 40 mg by mouth daily.   potassium chloride SA 20 MEQ tablet Commonly known as:  K-DUR,KLOR-CON Take 1 tablet (20 mEq total) by mouth daily. For one week then stop.   rOPINIRole 0.5 MG tablet Commonly known as:  REQUIP Take 0.5 mg by mouth at bedtime.   traMADol  50 MG tablet Commonly known as:  ULTRAM Take 1 tablet (50 mg total) by mouth every 6 (six) hours as needed for severe pain. What changed:  when to take this  reasons to take this      The patient has been discharged on:   1.Beta Blocker:  Yes [  x ]                              No   [   ]                              If No, reason:  2.Ace Inhibitor/ARB: Yes [ x  ]                                     No  [    ]                                     If No, reason:  3.Statin:   Yes [   ]                  No  [ x  ]                  If No, reason:Allergy  4.Shela Commons:  Yes  [ x  ]                  No   [   ]                  If No, reason:  Follow Up Appointments: Follow-up Information    Gaye Pollack, MD Follow up on 08/03/2016.   Specialty:  Cardiothoracic Surgery Why:  PA/LAT CXR to be taken (at [facility] which is in the same building as Dr. Vivi Martens office) on 08/03/2016 at 9:30 am; Appointment is at 10:00 am Contact information: [place] [address] [phone]        Mertie Moores, MD .   Specialty:  Cardiology Why:  Call to have a follow up appointment for 2 weeks Contact information: [address] [place] [address] [phone]        [facility] .   Why:  [facility]  arranged- they will call you to set up home visits Contact information: [place] [address] [phone]           Signed: Lars Pinks MPA-C 07/13/2016, 8:28 AM

## 2016-07-10 NOTE — Discharge Instructions (Signed)
Endoscopic Saphenous Vein Harvesting, Care After Refer to this sheet in the next few weeks. These instructions provide you with information on caring for yourself after your procedure. Your health care provider may also give you more specific instructions. Your treatment has been planned according to current medical practices, but problems sometimes occur. Call your health care provider if you have any problems or questions after your procedure. HOME CARE INSTRUCTIONS Medicine  Take whatever pain medicine your surgeon prescribes. Follow the directions carefully. Do not take over-the-counter pain medicine unless your surgeon says it is okay. Some pain medicine can cause bleeding problems for several weeks after surgery.  Follow your surgeon's instructions about driving. You will probably not be permitted to drive after heart surgery.  Take any medicines your surgeon prescribes. Any medicines you took before your heart surgery should be checked with your health care provider before you start taking them again. Wound care  If your surgeon has prescribed an elastic bandage or stocking, ask how long you should wear it.  Check the area around your surgical cuts (incisions) whenever your bandages (dressings) are changed. Look for any redness or swelling.  You will need to return to have the stitches (sutures) or staples taken out. Ask your surgeon when to do that.  Ask your surgeon when you can shower or bathe. Activity  Try to keep your legs raised when you are sitting.  Do any exercises your health care providers have given you. These may include deep breathing exercises, coughing, walking, or other exercises. SEEK MEDICAL CARE IF:  You have any questions about your medicines.  You have more leg pain, especially if your pain medicine stops working.  New or growing bruises develop on your leg.  Your leg swells, feels tight, or becomes red.  You have numbness in your leg. SEEK IMMEDIATE  MEDICAL CARE IF:  Your pain gets much worse.  Blood or fluid leaks from any of the incisions.  Your incisions become warm, swollen, or red.  You have chest pain.  You have trouble breathing.  You have a fever.  You have more pain near your leg incision. MAKE SURE YOU:  Understand these instructions.  Will watch your condition.  Will get help right away if you are not doing well or get worse.   This information is not intended to replace advice given to you by your health care provider. Make sure you discuss any questions you have with your health care provider.   Document Released: 08/03/2011 Document Revised: 12/12/2014 Document Reviewed: 08/03/2011 Elsevier Interactive Patient Education 2016 [employer] Coronary Artery Bypass Grafting, Care After Refer to this sheet in the next few weeks. These instructions provide you with information on caring for yourself after your procedure. Your health care provider may also give you more specific instructions. Your treatment has been planned according to current medical practices, but problems sometimes occur. Call your health care provider if you have any problems or questions after your procedure. WHAT TO EXPECT AFTER THE PROCEDURE Recovery from surgery will be different for everyone. Some people feel well after 3 or 4 weeks, while for others it takes longer. After your procedure, it is typical to have the following:  Nausea and a lack of appetite.   Constipation.  Weakness and fatigue.   Depression or irritability.   Pain or discomfort at your incision site. HOME CARE INSTRUCTIONS  Take medicines only as directed by your health care provider. Do not stop taking medicines or start any  new medicines without first checking with your health care provider.  Take your pulse as directed by your health care provider.  Perform deep breathing as directed by your health care provider. If you were given a device called an  incentive spirometer, use it to practice deep breathing several times a day. Support your chest with a pillow or your arms when you take deep breaths or cough.  Keep incision areas clean, dry, and protected. Remove or change any bandages (dressings) only as directed by your health care provider. You may have skin adhesive strips over the incision areas. Do not take the strips off. They will fall off on their own.  Check incision areas daily for any swelling, redness, or drainage.  If incisions were made in your legs, do the following:  Avoid crossing your legs.   Avoid sitting for long periods of time. Change positions every 30 minutes.   Elevate your legs when you are sitting.  Wear compression stockings as directed by your health care provider. These stockings help keep blood clots from forming in your legs.  Take showers once your health care provider approves. Until then, only take sponge baths. Pat incisions dry. Do not rub incisions with a washcloth or towel. Do not take baths, swim, or use a hot tub until your health care provider approves.  Eat foods that are high in fiber, such as raw fruits and vegetables, whole grains, beans, and nuts. Meats should be lean cut. Avoid canned, processed, and fried foods.  Drink enough fluid to keep your urine clear or pale yellow.  Weigh yourself every day. This helps identify if you are retaining fluid that may make your heart and lungs work harder.  Rest and limit activity as directed by your health care provider. You may be instructed to:  Stop any activity at once if you have chest pain, shortness of breath, irregular heartbeats, or dizziness. Get help right away if you have any of these symptoms.  Move around frequently for short periods or take short walks as directed by your health care provider. Increase your activities gradually. You may need physical therapy or cardiac rehabilitation to help strengthen your muscles and build your  endurance.  Avoid lifting, pushing, or pulling anything heavier than 10 lb (4.5 kg) for at least 6 weeks after surgery.  Do not drive until your health care provider approves.  Ask your health care provider when you may return to work.  Ask your health care provider when you may resume sexual activity.  Keep all follow-up visits as directed by your health care provider. This is important. SEEK MEDICAL CARE IF:  You have swelling, redness, increasing pain, or drainage at the site of an incision.  You have a fever.  You have swelling in your ankles or legs.  You have pain in your legs.   You gain 2 or more pounds (0.9 kg) a day.  You are nauseous or vomit.  You have diarrhea. SEEK IMMEDIATE MEDICAL CARE IF:  You have chest pain that goes to your jaw or arms.  You have shortness of breath.   You have a fast or irregular heartbeat.   You notice a "clicking" in your breastbone (sternum) when you move.   You have numbness or weakness in your arms or legs.  You feel dizzy or light-headed.  MAKE SURE YOU:  Understand these instructions.  Will watch your condition.  Will get help right away if you are not doing well or get  worse.   This information is not intended to replace advice given to you by your health care provider. Make sure you discuss any questions you have with your health care provider.   Document Released: 06/10/2005 Document Revised: 12/12/2014 Document Reviewed: 04/30/2013 Elsevier Interactive Patient Education [employer].

## 2016-07-10 NOTE — Evaluation (Signed)
Physical Therapy Evaluation Patient Details Name: Katherine Hall MRN: [id number] DOB: October 27, 1941 Today's Date: 07/10/2016   History of Present Illness  Patient is a 75 yo female admitted 07/03/16 with unstable angina.  Cardiac cath showed multivessel disease.  Patient s/p CABG x5.   PMH:  HTN, HLD, strong family hx of CAD    Clinical Impression  Patient presents with problems listed below.  Will benefit from acute PT to maximize functional mobility prior to discharge home with husband.  At this point, recommend HHPT for continued therapy at d/c - will continue to assess need.    Follow Up Recommendations Home health PT;Supervision/Assistance - 24 hour    Equipment Recommendations  None recommended by PT    Recommendations for Other Services       Precautions / Restrictions Precautions Precautions: Sternal;Fall Precaution Comments: Reviewed sternal precautions with patient. Restrictions Other Position/Activity Restrictions: Sternal precautions      Mobility  Bed Mobility Overal bed mobility: Needs Assistance Bed Mobility: Rolling;Sidelying to Sit;Sit to Sidelying Rolling: Min guard Sidelying to sit: Min assist     Sit to sidelying: Min assist General bed mobility comments: Verbal cues for technique to maintain sternal precautions.  Patient using pillow appropriately during mobility.  Assist to raise trunk to sitting position.  Once upright, patient with good sitting balance.  Assist to bring LE's onto bed to return to sidelying.  Transfers Overall transfer level: Needs assistance Equipment used: Rolling walker (2 wheeled) Transfers: Sit to/from Stand Sit to Stand: Min assist         General transfer comment: Verbal cues for hand placement/use of pillow.  Patient able to rock and use momentum to move to standing from bed and toilet with min assist.  Ambulation/Gait Ambulation/Gait assistance: Min guard Ambulation Distance (Feet): 140 Feet Assistive device: Rolling  walker (2 wheeled) Gait Pattern/deviations: Step-through pattern;Decreased step length - right;Decreased step length - left;Decreased stride length Gait velocity: decreased Gait velocity interpretation: Below normal speed for age/gender General Gait Details: Verbal cues for safe use of RW, and to minimize UE weight bearing.   Patient with slow, guarded gait.  Stairs            Wheelchair Mobility    Modified Rankin (Stroke Patients Only)       Balance Overall balance assessment: Needs assistance         Standing balance support: Single extremity supported;During functional activity Standing balance-Leahy Scale: Poor Standing balance comment: UE support for balance while brushing teeth at sink.                             Pertinent Vitals/Pain Pain Assessment: 0-10 Pain Score: 4  Pain Location: Chest/incision Pain Descriptors / Indicators: Sore Pain Intervention(s): Monitored during session;Repositioned;Patient requesting pain meds-RN notified    Home Living Family/patient expects to be discharged to:: Private residence Living Arrangements: Spouse/significant other Available Help at Discharge: Family;Available 24 hours/day (Husband) Type of Home: House Home Access: Stairs to enter Entrance Stairs-Rails: None Entrance Stairs-Number of Steps: 1 Home Layout: One level Home Equipment: Walker - 2 wheels;Shower seat;Cane - single point;Bedside commode;Wheelchair - manual (Has access to the wheelchair)      Prior Function Level of Independence: Independent         Comments: Driving     Hand Dominance        Extremity/Trunk Assessment   Upper Extremity Assessment: Overall WFL for tasks assessed (Decreased ROM Rt shoulder)  Lower Extremity Assessment: Generalized weakness         Communication   Communication: No difficulties  Cognition Arousal/Alertness: Awake/alert Behavior During Therapy: WFL for tasks  assessed/performed Overall Cognitive Status: Within Functional Limits for tasks assessed                      General Comments      Exercises        Assessment/Plan    PT Assessment Patient needs continued PT services  PT Diagnosis Difficulty walking;Generalized weakness;Acute pain   PT Problem List Decreased strength;Decreased activity tolerance;Decreased balance;Decreased mobility;Decreased knowledge of use of DME;Decreased knowledge of precautions;Cardiopulmonary status limiting activity;Pain  PT Treatment Interventions DME instruction;Gait training;Functional mobility training;Therapeutic activities;Patient/family education   PT Goals (Current goals can be found in the Care Plan section) Acute Rehab PT Goals Patient Stated Goal: None stated PT Goal Formulation: With patient Time For Goal Achievement: 07/17/16 Potential to Achieve Goals: Good    Frequency Min 3X/week   Barriers to discharge        Co-evaluation               End of Session Equipment Utilized During Treatment: Gait belt Activity Tolerance: Patient tolerated treatment well Patient left: in bed;with call bell/phone within reach;with bed alarm set;with nursing/sitter in room Nurse Communication: Mobility status;Patient requests pain meds         Time: [id number] PT Time Calculation (min) (ACUTE ONLY): 32 min   Charges:   PT Evaluation $PT Eval Moderate Complexity: 1 Procedure PT Treatments $Gait Training: 8-22 mins   PT G Codes:        Despina Pole July 22, 2016, 7:08 PM Carita Pian. Sanjuana Kava, [place] Pager [phone]

## 2016-07-11 ENCOUNTER — Inpatient Hospital Stay (HOSPITAL_COMMUNITY): Payer: Medicare Other

## 2016-07-11 LAB — GLUCOSE, CAPILLARY
Glucose-Capillary: 130 mg/dL — ABNORMAL HIGH (ref 65–99)
Glucose-Capillary: 128 mg/dL — ABNORMAL HIGH (ref 65–99)

## 2016-07-11 MED ORDER — LEVALBUTEROL HCL 0.63 MG/3ML IN NEBU: 0.6300 mg | INHALATION_SOLUTION | Freq: Three times a day (TID) | RESPIRATORY_TRACT | Status: DC | PRN

## 2016-07-11 MED ORDER — FUROSEMIDE 40 MG PO TABS
40.0000 mg | ORAL_TABLET | Freq: Once | ORAL | Status: AC
  Administered 2016-07-11: 40 mg via ORAL
  Filled 2016-07-11: qty 1

## 2016-07-11 MED ORDER — LACTULOSE 10 GM/15ML PO SOLN
20.0000 g | Freq: Once | ORAL | Status: AC
  Administered 2016-07-11: 20 g via ORAL
  Filled 2016-07-11: qty 30

## 2016-07-11 MED ORDER — POTASSIUM CHLORIDE CRYS ER 20 MEQ PO TBCR
40.0000 meq | EXTENDED_RELEASE_TABLET | Freq: Once | ORAL | Status: AC
  Administered 2016-07-11: 40 meq via ORAL
  Filled 2016-07-11: qty 2

## 2016-07-11 MED ORDER — LISINOPRIL 5 MG PO TABS
5.0000 mg | ORAL_TABLET | Freq: Every day | ORAL | Status: DC
  Administered 2016-07-11 – 2016-07-12 (×2): 5 mg via ORAL
  Filled 2016-07-11 (×2): qty 1

## 2016-07-11 MED ORDER — ENOXAPARIN SODIUM 30 MG/0.3ML ~~LOC~~ SOLN
30.0000 mg | SUBCUTANEOUS | Status: DC
  Administered 2016-07-12: 30 mg via SUBCUTANEOUS
  Filled 2016-07-11: qty 0.3

## 2016-07-11 MED ORDER — FUROSEMIDE 10 MG/ML IJ SOLN
20.0000 mg | Freq: Once | INTRAMUSCULAR | Status: AC
  Administered 2016-07-11: 20 mg via INTRAVENOUS
  Filled 2016-07-11: qty 2

## 2016-07-11 NOTE — Progress Notes (Signed)
PT Cancellation Note  Patient Details Name: Katherine Hall MRN: [id number] DOB: Jan 04, 1941   Cancelled Treatment:    Reason Eval/Treat Not Completed: Other (comment). 1st attempt pt on bed rest after pacing wires pulled, 2nd attempt visiting with visitors, 3rd attempt amb with nurse tech. Will try again tomorrow.   Aundra Pung 07/11/2016, 4:09 PM [employer] PT [phone]

## 2016-07-11 NOTE — Progress Notes (Addendum)
      [address]       [place] [address]             [phone]        4 Days Post-Op Procedure(s) (LRB): CORONARY ARTERY BYPASS GRAFTING (CABG) x 5 with endoscopic harvesting of the right greater saphenous vein (N/A) INTRAOPERATIVE TRANSESOPHAGEAL ECHOCARDIOGRAM (N/A)  Subjective: Patient states had wheezing and some difficulty breathing last night.  Objective: Vital signs in last 24 hours: Temp:  [98.1 F (36.7 C)-98.9 F (37.2 C)] 98.4 F (36.9 C) (08/07 0335) Pulse Rate:  [81-96] 81 (08/07 0335) Cardiac Rhythm: Normal sinus rhythm (08/06 1900) Resp:  [18] 18 (08/07 0335) BP: (117-148)/(63-94) 148/83 (08/07 0335) SpO2:  [90 %-100 %] 100 % (08/07 0335) Weight:  [127 lb (57.6 kg)] 127 lb (57.6 kg) (08/07 0500)  Pre op weight 53 kg Current Weight  07/11/16 127 lb (57.6 kg)      Intake/Output from previous day: 08/06 0701 - 08/07 0700 In: -  Out: 500 [Urine:500]   Physical Exam:  Cardiovascular: RRR Pulmonary: Slightly diminished at bases, no wheezing Abdomen: Soft, non tender, bowel sounds present. Extremities: Mild bilateral lower extremity edema. Wounds: RLE wounds are clean and dry.  No erythema or signs of infection. Sternal dressing is mostly dry.  Lab Results: CBC:  Recent Labs  07/09/16 0421 07/10/16 0618  WBC 8.9 8.7  HGB 8.3* 8.1*  HCT 25.0* 24.5*  PLT 129* 176   BMET:   Recent Labs  07/09/16 0421 07/10/16 0300  NA 133* 133*  K 4.3 3.9  CL 102 102  CO2 23 25  GLUCOSE 130* 128*  BUN 9 11  CREATININE 0.62 0.62  CALCIUM 8.5* 8.4*    PT/INR:  Lab Results  Component Value Date   INR 1.58 07/07/2016   INR 0.81 07/03/2016   ABG:  INR: Will add last result for INR, ABG once components are confirmed Will add last 4 CBG results once components are confirmed  Assessment/Plan:  1. CV - SR in the 80's. On Lopressor 12.5 mg bid. Will start Lisinopril for better BP control. 2.  Pulmonary - On 2 liters of oxygen  this am. Wean to room air as tolerates. Xopenex PRN wheezing.  Will check CXR today.Encourage incentive spirometer. 3. Volume Overload - On Lasix 40 mg daily. 4.  Acute blood loss anemia - Last H and H 8.1 and 24.5. Continue Niferex. 5. Remove EPW  6. Will order HHPT as recommended 7. LOC constipation  ZIMMERMAN,DONIELLE MPA-C 07/11/2016,7:50 AM    Chart reviewed, patient examined, agree with above. CXR shows bibasilar atelectasis and small pleural effusions. She needs to work hard on IS and ambulate. Her weight is 10 lbs over preop. Continue diuresis. Will give her an extra dose of lasix this evening.

## 2016-07-11 NOTE — Progress Notes (Signed)
CARDIAC REHAB PHASE I   PRE:  Rate/Rhythm: 86 SR  BP:  Sitting: 138/76        SaO2: 92 RA  MODE:  Ambulation: 250 ft   POST:  Rate/Rhythm: 105 ST  BP:  Sitting: 154/79         SaO2: 93 RA  Pt ambulated 250 ft on RA, rolling walker, slow, fairly steady gait, tolerated well. Pt c/o mild DOE, fatigue, brief standing rest x2. Pt sats 92-95% on RA during ambulation. Encouraged IS, additional ambulation x2 today. Pt to recliner after walk, feet elevated, call bell within reach. Will follow.    [place], RN, BSN 07/11/2016 10:47 AM

## 2016-07-11 NOTE — Care Management Important Message (Signed)
Important Message  Patient Details  Name: Katherine Hall MRN: [id number] Date of Birth: 04-Jul-1941   Medicare Important Message Given:  Yes    [name] 07/11/2016, 11:02 AM

## 2016-07-11 NOTE — Progress Notes (Signed)
     SUBJECTIVE: Some dyspnea overnight.   BP (!) 148/83 (BP Location: Left Arm)   Pulse 81   Temp 98.4 F (36.9 C) (Oral)   Resp 18   Ht '4\' 10"'$  (1.473 m)   Wt 127 lb (57.6 kg)   SpO2 100%   BMI 26.54 kg/m   Intake/Output Summary (Last 24 hours) at 07/11/16 0913 Last data filed at 07/11/16 3825  Gross per 24 hour  Intake                0 ml  Output              650 ml  Net             -650 ml    PHYSICAL EXAM General: Well developed, well nourished, in no acute distress. Alert and oriented x 3.  Psych:  Good affect, responds appropriately Neck: No JVD. No masses noted.  Lungs: Clear bilaterally with no wheezes or rhonci noted.  Heart: RRR with no murmurs noted. Abdomen: Bowel sounds are present. Soft, non-tender.  Extremities: No lower extremity edema.   LABS: Basic Metabolic Panel:  Recent Labs  07/08/16 1700  07/09/16 0421 07/10/16 0300  NA  --   < > 133* 133*  K  --   < > 4.3 3.9  CL  --   < > 102 102  CO2  --   --  23 25  GLUCOSE  --   < > 130* 128*  BUN  --   < > 9 11  CREATININE 0.62  < > 0.62 0.62  CALCIUM  --   --  8.5* 8.4*  MG 2.3  --   --   --   < > = values in this interval not displayed. CBC:  Recent Labs  07/09/16 0421 07/10/16 0618  WBC 8.9 8.7  HGB 8.3* 8.1*  HCT 25.0* 24.5*  MCV 91.2 92.5  PLT 129* 176   Current Meds: . acetaminophen  1,000 mg Oral Q6H  . antiseptic oral rinse  7 mL Mouth Rinse QID  . aspirin EC  325 mg Oral Daily  . bisacodyl  10 mg Oral Daily   Or  . bisacodyl  10 mg Rectal Daily  . chlorhexidine gluconate (SAGE KIT)  15 mL Mouth Rinse BID  . docusate sodium  200 mg Oral Daily  . [START ON 07/12/2016] enoxaparin (LOVENOX) injection  30 mg Subcutaneous Q24H  . folic acid-pyridoxine-cyancobalamin  1 tablet Oral Daily  . furosemide  40 mg Oral Daily  . iron polysaccharides  150 mg Oral Daily  . lactulose  20 g Oral Once  . levothyroxine  50 mcg Oral QAC breakfast  . lisinopril  5 mg Oral Daily  . metoprolol  tartrate  12.5 mg Oral BID  . pantoprazole  40 mg Oral Daily  . potassium chloride  20 mEq Oral Daily  . sodium chloride flush  3 mL Intravenous Q12H  . sodium chloride flush  3 mL Intravenous Q12H     ASSESSMENT AND PLAN:  1. CAD: admitted with unstable angina. She is now s/p CABG. She is doing well. Continue ASA, beta blocker. She is statin intolerant.   2. HTN: BP controlled.   3. Dyspnea: Small pleural effusions post-op. She does not appear to be volume overloaded. Continue po Lasix.       Katherine Hall  8/7/20179:13 AM

## 2016-07-11 NOTE — Progress Notes (Signed)
Removed epicardial wires per order. 4 intact.  Pt tolerated procedure well.  Pt instructed to remain on bedrest for one hour.  Frequent vitals will be taken and documented. Pt resting with call bell within reach. ° °

## 2016-07-12 MED ORDER — ACYCLOVIR 200 MG PO CAPS
200.0000 mg | ORAL_CAPSULE | Freq: Three times a day (TID) | ORAL | Status: DC
  Administered 2016-07-12 – 2016-07-13 (×4): 200 mg via ORAL
  Filled 2016-07-12 (×5): qty 1

## 2016-07-12 MED ORDER — FUROSEMIDE 40 MG PO TABS
40.0000 mg | ORAL_TABLET | Freq: Every day | ORAL | Status: DC
  Administered 2016-07-13: 40 mg via ORAL
  Filled 2016-07-12: qty 1

## 2016-07-12 MED ORDER — METOPROLOL TARTRATE 25 MG PO TABS
25.0000 mg | ORAL_TABLET | Freq: Two times a day (BID) | ORAL | Status: DC
  Administered 2016-07-12 (×2): 25 mg via ORAL
  Filled 2016-07-12 (×2): qty 1

## 2016-07-12 MED ORDER — ACYCLOVIR 400 MG PO TABS
200.0000 mg | ORAL_TABLET | Freq: Three times a day (TID) | ORAL | Status: DC
  Filled 2016-07-12: qty 0.5

## 2016-07-12 MED ORDER — FUROSEMIDE 10 MG/ML IJ SOLN
40.0000 mg | Freq: Once | INTRAMUSCULAR | Status: AC
  Administered 2016-07-12: 40 mg via INTRAVENOUS
  Filled 2016-07-12: qty 4

## 2016-07-12 MED ORDER — POTASSIUM CHLORIDE CRYS ER 20 MEQ PO TBCR
20.0000 meq | EXTENDED_RELEASE_TABLET | Freq: Two times a day (BID) | ORAL | Status: DC
  Administered 2016-07-12 (×2): 20 meq via ORAL
  Filled 2016-07-12 (×2): qty 1

## 2016-07-12 MED FILL — Heparin Sodium (Porcine) Inj 1000 Unit/ML: INTRAMUSCULAR | Qty: 2500 | Status: AC

## 2016-07-12 NOTE — Progress Notes (Addendum)
      [address]       [place] [address]             [phone]        5 Days Post-Op Procedure(s) (LRB): CORONARY ARTERY BYPASS GRAFTING (CABG) x 5 with endoscopic harvesting of the right greater saphenous vein (N/A) INTRAOPERATIVE TRANSESOPHAGEAL ECHOCARDIOGRAM (N/A)  Subjective: Patient states breathing is better.  Objective: Vital signs in last 24 hours: Temp:  [98.8 F (37.1 C)] 98.8 F (37.1 C) (08/08 0500) Pulse Rate:  [84-112] 88 (08/08 0500) Cardiac Rhythm: Sinus tachycardia (08/07 1900) Resp:  [18-20] 18 (08/08 0500) BP: (124-157)/(68-108) 151/78 (08/08 0500) SpO2:  [96 %-98 %] 96 % (08/08 0500) Weight:  [118 lb 11.2 oz (53.8 kg)] 118 lb 11.2 oz (53.8 kg) (08/08 0500)  Pre op weight 53 kg Current Weight  07/12/16 118 lb 11.2 oz (53.8 kg)      Intake/Output from previous day: 08/07 0701 - 08/08 0700 In: 58 [P.O.:490] Out: 450 [Urine:450]   Physical Exam:  Cardiovascular: RRR Pulmonary: Slightly diminished at bases Abdomen: Soft, non tender, bowel sounds present. Extremities: Mild bilateral lower extremity edema. Wounds: RLE and sternal wounds are clean and dry.  No erythema or signs of infection.   Lab Results: CBC:  Recent Labs  07/10/16 0618  WBC 8.7  HGB 8.1*  HCT 24.5*  PLT 176   BMET:   Recent Labs  07/10/16 0300  NA 133*  K 3.9  CL 102  CO2 25  GLUCOSE 128*  BUN 11  CREATININE 0.62  CALCIUM 8.4*    PT/INR:  Lab Results  Component Value Date   INR 1.58 07/07/2016   INR 0.81 07/03/2016   ABG:  INR: Will add last result for INR, ABG once components are confirmed Will add last 4 CBG results once components are confirmed  Assessment/Plan:  1. CV - SR in the 80's. On Lopressor 12.5 mg bid and Lisinopril 5 mg daily. Will increase Lopressor for better BP and HR control. 2.  Pulmonary - On room air. Xopenex PRN wheezing.  Encourage incentive spirometer. 3. Volume Overload - On Lasix 40 mg daily. Will  give IV this am. 4.  Acute blood loss anemia - Last H and H 8.1 and 24.5. Continue Niferex. 5. Hopefully, home in 1-2 days   ZIMMERMAN,DONIELLE MPA-C 07/12/2016,7:25 AM    Chart reviewed, patient examined, agree with above. Diuresed well yesterday. Weight down 8 lbs and now only 2 lbs over preop. Had large BM. Feels much better and slept well with no shortness of breath. She could go home tomorrow if feeling well in the am.

## 2016-07-12 NOTE — Progress Notes (Addendum)
CARDIAC REHAB PHASE I   PRE:  Rate/Rhythm: 115 ST  BP:  Sitting: 126/79        SaO2: 98 RA  MODE:  Ambulation: 300 ft   POST:  Rate/Rhythm: 122 ST  BP:  Sitting: 121/77         SaO2: 99 RA  Pt assisted to bathroom, then proceeded to ambulate 300 ft on RA, rolling walker, steady gait, assist x1, tolerated well. Pt c/o mild DOE, standing rest x1. HR slightly more elevated today. Pt to recliner after walk, feet elevated, call bell within reach. Will follow.  [id number] Lenna Sciara, RN, BSN 07/12/2016 10:13 AM

## 2016-07-12 NOTE — Progress Notes (Signed)
Physical Therapy Treatment Patient Details Name: Katherine Hall MRN: [id number] DOB: 04-05-1941 Today's Date: 08/02/2016    History of Present Illness Patient is a 75 yo female admitted 07/03/16 with unstable angina.  Cardiac cath showed multivessel disease.  Patient s/p CABG x5.   PMH:  HTN, HLD, strong family hx of CAD    PT Comments    Pt making excellent progress.  Follow Up Recommendations  Home health PT;Supervision/Assistance - 24 hour     Equipment Recommendations  None recommended by PT    Recommendations for Other Services       Precautions / Restrictions Precautions Precautions: Sternal;Fall Precaution Comments: Reviewed sternal precautions with patient. Restrictions Other Position/Activity Restrictions: Sternal precautions    Mobility  Bed Mobility Overal bed mobility: Modified Independent Bed Mobility: Rolling;Sit to Sidelying Rolling: Modified independent (Device/Increase time)       Sit to sidelying: Min assist;Modified independent (Device/Increase time) General bed mobility comments: Good technique  Transfers Overall transfer level: Needs assistance Equipment used: Rolling walker (2 wheeled) Transfers: Sit to/from Stand Sit to Stand: Modified independent (Device/Increase time)            Ambulation/Gait Ambulation/Gait assistance: Supervision Ambulation Distance (Feet): 320 Feet Assistive device: Rolling walker (2 wheeled) Gait Pattern/deviations: Step-through pattern;Decreased stride length Gait velocity: decreased Gait velocity interpretation: Below normal speed for age/gender General Gait Details: Steady gait with walker   Stairs            Wheelchair Mobility    Modified Rankin (Stroke Patients Only)       Balance Overall balance assessment: Needs assistance Sitting-balance support: No upper extremity supported;Feet supported Sitting balance-Leahy Scale: Good     Standing balance support: No upper extremity  supported Standing balance-Leahy Scale: Fair                      Cognition Arousal/Alertness: Awake/alert Behavior During Therapy: WFL for tasks assessed/performed Overall Cognitive Status: Within Functional Limits for tasks assessed                      Exercises      General Comments        Pertinent Vitals/Pain Pain Assessment: No/denies pain    Home Living                      Prior Function            PT Goals (current goals can now be found in the care plan section) Acute Rehab PT Goals Patient Stated Goal: None stated Progress towards PT goals: Progressing toward goals    Frequency  Min 3X/week    PT Plan Current plan remains appropriate    Co-evaluation             End of Session   Activity Tolerance: Patient tolerated treatment well Patient left: in bed;with call bell/phone within reach;with family/visitor present     Time: [id number] PT Time Calculation (min) (ACUTE ONLY): 18 min  Charges:  $Gait Training: 8-22 mins                    G Codes:      Katherine Hall 08-02-16, 3:49 PM [facility] PT [phone]

## 2016-07-12 NOTE — Care Management Note (Signed)
Case Management Note Previous CM note initiated by Elenor Quinones RN, CM  Patient Details  Name: Katherine Hall MRN: [id number] Date of Birth: 1941-03-29  Subjective/Objective:  Pt presented for chest pain. S/P Cardiac cath 8/1 showed severe triple-vessel CAD - Cardiothoracic surgery consulted, plan is for CABG on 07/07/16, continue heparin for now.                    Action/Plan: Pt is from home with support of husband. Pt has DME: RW and shower chair. CM will continue to monitor post procedure for home needs.   Expected Discharge Date:                  Expected Discharge Plan:  [facility]  In-House Referral:     Discharge planning Services  CM Consult  Post Acute Care Choice:  Home Health Choice offered to:  Patient  DME Arranged:  N/A DME Agency:  NA  HH Arranged:  PT [place] Agency:  [facility]  Status of Service:  Completed, signed off  If discussed at [place] of Stay Meetings, dates discussed:    Additional Comments:  07/12/16- 1145- Zenola Dezarn RN, CM-  Orders have been placed for HHPT- per PT Recommendations- in to speak with pt at bedside- choice offered for [facility] agencies- per pt she would like to use [facility] for [facility] services- referral called to [facility] with [facility] - per pt she has needed DME that includes RW, shower chair, BSC, and w/c available if needed.   7/30-Pt from home with husband.  Husband will provide recommended supervision at discharge.  CM will continue to follow for discharge needs   Dawayne Patricia, RN 07/12/2016, 11:47 AM [phone]

## 2016-07-13 MED ORDER — ASPIRIN 325 MG PO TBEC: 325.0000 mg | DELAYED_RELEASE_TABLET | Freq: Every day | ORAL | 0 refills | Status: DC

## 2016-07-13 MED ORDER — FUROSEMIDE 40 MG PO TABS: 40.0000 mg | ORAL_TABLET | Freq: Every day | ORAL | 0 refills | Status: DC

## 2016-07-13 MED ORDER — POTASSIUM CHLORIDE CRYS ER 20 MEQ PO TBCR
20.0000 meq | EXTENDED_RELEASE_TABLET | Freq: Every day | ORAL | Status: DC
  Administered 2016-07-13: 20 meq via ORAL
  Filled 2016-07-13: qty 1

## 2016-07-13 MED ORDER — METOPROLOL TARTRATE 37.5 MG PO TABS: 37.5000 mg | ORAL_TABLET | Freq: Two times a day (BID) | ORAL | 1 refills | Status: DC

## 2016-07-13 MED ORDER — FA-PYRIDOXINE-CYANOCOBALAMIN 2.5-25-2 MG PO TABS: 1.0000 | ORAL_TABLET | Freq: Every day | ORAL | Status: DC

## 2016-07-13 MED ORDER — POLYSACCHARIDE IRON COMPLEX 150 MG PO CAPS: 150.0000 mg | ORAL_CAPSULE | Freq: Every day | ORAL | 0 refills | Status: DC

## 2016-07-13 MED ORDER — ACYCLOVIR 400 MG PO TABS: ORAL_TABLET | ORAL | Status: DC

## 2016-07-13 MED ORDER — METOPROLOL TARTRATE 25 MG PO TABS
37.5000 mg | ORAL_TABLET | Freq: Two times a day (BID) | ORAL | Status: DC
  Administered 2016-07-13: 37.5 mg via ORAL
  Filled 2016-07-13: qty 1

## 2016-07-13 MED ORDER — LISINOPRIL 10 MG PO TABS
10.0000 mg | ORAL_TABLET | Freq: Every day | ORAL | Status: DC
  Administered 2016-07-13: 10 mg via ORAL
  Filled 2016-07-13: qty 1

## 2016-07-13 MED ORDER — LISINOPRIL 10 MG PO TABS: 10.0000 mg | ORAL_TABLET | Freq: Every day | ORAL | 1 refills | Status: DC

## 2016-07-13 MED ORDER — TRAMADOL HCL 50 MG PO TABS: 50.0000 mg | ORAL_TABLET | Freq: Four times a day (QID) | ORAL | 0 refills | Status: DC | PRN

## 2016-07-13 MED ORDER — POTASSIUM CHLORIDE CRYS ER 20 MEQ PO TBCR: 20.0000 meq | EXTENDED_RELEASE_TABLET | Freq: Every day | ORAL | 0 refills | Status: DC

## 2016-07-13 NOTE — Progress Notes (Signed)
CARDIAC REHAB PHASE I  Pt awaiting discharge to home, declines ambulation at this time. Cardiac surgery discharge education completed with pt and husband at bedside. Reviewed risk factors, IS, sternal precautions, activity progression, exercise, heart healthy, sodium restrictions, daily weights and phase 2 cardiac rehab. Pt verbalized understanding, receptive to education. Pt agrees to phase 2 cardiac rehab referral, will send to [facility] per pt request. Pt assisted to bathroom, husband at bedside, RN aware, call bell within reach.   [id number]  Lenna Sciara, RN, BSN 07/13/2016 11:57 AM

## 2016-07-13 NOTE — Care Management Important Message (Signed)
Important Message  Patient Details  Name: Katherine Hall MRN: [id number] Date of Birth: [birth year]   Medicare Important Message Given:  Yes    Lashun Mccants, Leroy Sea 07/13/2016, 3:15 PM

## 2016-07-13 NOTE — Progress Notes (Signed)
6 Days Post-Op Procedure(s) (LRB): CORONARY ARTERY BYPASS GRAFTING (CABG) x 5 with endoscopic harvesting of the right greater saphenous vein (N/A) INTRAOPERATIVE TRANSESOPHAGEAL ECHOCARDIOGRAM (N/A) Subjective:  No complaints  Objective: Vital signs in last 24 hours: Temp:  [98 F (36.7 C)-98.7 F (37.1 C)] 98 F (36.7 C) (08/09 0426) Pulse Rate:  [86-107] 104 (08/09 0426) Cardiac Rhythm: Normal sinus rhythm (08/09 0718) Resp:  [18-20] 18 (08/09 0426) BP: (121-154)/(72-85) 133/72 (08/09 0426) SpO2:  [97 %-99 %] 99 % (08/09 0426) Weight:  [55.2 kg (121 lb 11.2 oz)] 55.2 kg (121 lb 11.2 oz) (08/09 0426)  Hemodynamic parameters for last 24 hours:    Intake/Output from previous day: 08/08 0701 - 08/09 0700 In: 200 [P.O.:200] Out: 250 [Urine:250] Intake/Output this shift: No intake/output data recorded.  General appearance: alert and cooperative Heart: regular rate and rhythm, S1, S2 normal, no murmur, click, rub or gallop Lungs: decreased in bases Extremities: extremities normal, atraumatic, no cyanosis or edema Wound: incisions ok  Lab Results: No results for input(s): WBC, HGB, HCT, PLT in the last 72 hours. BMET: No results for input(s): NA, K, CL, CO2, GLUCOSE, BUN, CREATININE, CALCIUM in the last 72 hours.  PT/INR: No results for input(s): LABPROT, INR in the last 72 hours. ABG    Component Value Date/Time   PHART 7.378 07/07/2016 2039   HCO3 24.2 (H) 07/07/2016 2039   TCO2 23 07/08/2016 1755   ACIDBASEDEF 1.0 07/07/2016 2039   O2SAT 98.0 07/07/2016 2039   CBG (last 3)   Recent Labs  07/11/16 0101 07/11/16 2127  GLUCAP 130* 128*    Assessment/Plan: S/P Procedure(s) (LRB): CORONARY ARTERY BYPASS GRAFTING (CABG) x 5 with endoscopic harvesting of the right greater saphenous vein (N/A) INTRAOPERATIVE TRANSESOPHAGEAL ECHOCARDIOGRAM (N/A)  She is doing well overall. BP still 130's-140's so will increase Lisinopril to 10 mg. I will increase further in the  office if BP remains elevated.  Weight is still about 5 lbs over preop so will keep on lasix 40 mg for another week.  She can go home today with nurse visit in one week to remove chest tube sutures and I will see in three weeks.   LOS: 8 days    Gaye Pollack 07/13/2016

## 2016-07-13 NOTE — Progress Notes (Signed)
Patient to D/C home with husband. D/C education done. IV removed. Tele monitor removed. CCMD notified. Handouts given for medication education. Patient and husband had no further questions at this time. D/C patient home with husband.   Domingo Dimes RN

## 2016-07-14 DIAGNOSIS — Z48812 Encounter for surgical aftercare following surgery on the circulatory system: Secondary | ICD-10-CM | POA: Diagnosis not present

## 2016-07-19 DIAGNOSIS — I25119 Atherosclerotic heart disease of native coronary artery with unspecified angina pectoris: Secondary | ICD-10-CM | POA: Insufficient documentation

## 2016-07-21 ENCOUNTER — Encounter (INDEPENDENT_AMBULATORY_CARE_PROVIDER_SITE_OTHER): Payer: Self-pay

## 2016-07-21 DIAGNOSIS — Z951 Presence of aortocoronary bypass graft: Secondary | ICD-10-CM

## 2016-07-21 DIAGNOSIS — Z4802 Encounter for removal of sutures: Secondary | ICD-10-CM

## 2016-07-27 ENCOUNTER — Ambulatory Visit (INDEPENDENT_AMBULATORY_CARE_PROVIDER_SITE_OTHER): Payer: Medicare Other | Admitting: Cardiology

## 2016-07-27 ENCOUNTER — Encounter: Payer: Self-pay | Admitting: Cardiology

## 2016-07-27 VITALS — BP 130/82 | HR 76 | Ht <= 58 in | Wt 117.8 lb

## 2016-07-27 DIAGNOSIS — Z951 Presence of aortocoronary bypass graft: Secondary | ICD-10-CM | POA: Diagnosis not present

## 2016-07-27 NOTE — Patient Instructions (Addendum)
Medication Instructions:  Your physician recommends that you continue on your current medications as directed. Please refer to the Current Medication list given to you today.  Labwork: None ordered  Testing/Procedures: None ordered  Follow-Up: You have been referred to [facility].  Office will call you to arrange this.  Your physician recommends that you schedule a follow-up appointment in: 3 months with Dr. Angelena Form  If you need a refill on your cardiac medications before your next appointment, please call your pharmacy.

## 2016-07-27 NOTE — Progress Notes (Signed)
Electrophysiology Office Note   Date:  07/27/2016   ID:  [name], Kurihara Jun 30, 1941, [name]  PCP:  No PCP Per Patient  Cardiologist:  Milos Milligan Meredith Leeds, MD    Chief Complaint  Patient presents with  . New Patient (Initial Visit)     History of Present Illness: Katherine Hall is a 75 y.o. female who presents today for electrophysiology evaluation.   History of HLD, HTN, hypothyroidism.  Cath done 07/05/16 after presentation for unstable angina which showed multivessel CAD and now s/p CABG x5 on 07/07/16. Her hospital course was uncomplicated and she was discharged in stable condition.  Since that time, she has been slowly improving. She says that rehab is going well.  Does have occasional fatigue.  She is currently not on a statin as she has tried multiple and have made her feel weak and tired as well as muscle aches.     Today, she denies symptoms of palpitations, chest pain, shortness of breath, orthopnea, PND, lower extremity edema, claudication, dizziness, presyncope, syncope, bleeding, or neurologic sequela. The patient is tolerating medications without difficulties and is otherwise without complaint today.    Past Medical History:  Diagnosis Date  . Anemia   . Arthritis   . Gastric ulcer    a. h/o bleeding ulcer 2009 requiring 3 pints of blood.  [name] History of removal of cyst    a. 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  [name] HLD (hyperlipidemia)    a. h/o intolerance of statins, prev on Repatha but insurance stopped covering.  [name] HTN (hypertension)    Past Surgical History:  Procedure Laterality Date  . ABDOMINAL HYSTERECTOMY    . APPENDECTOMY    . CARDIAC CATHETERIZATION N/A 07/05/2016   Procedure: Left Heart Cath and Coronary Angiography;  Surgeon: Burnell Blanks, MD;  Location: [facility];  Service: Cardiovascular;  Laterality: N/A;  . CARDIAC SURGERY     cyst, not on heart.  . CORONARY ARTERY BYPASS GRAFT N/A 07/07/2016     Procedure: CORONARY ARTERY BYPASS GRAFTING (CABG) x 5 with endoscopic harvesting of the right greater saphenous vein;  Surgeon: Gaye Pollack, MD;  Location: [facility];  Service: Open Heart Surgery;  Laterality: N/A;  . HEMORRHOID SURGERY    . INTRAOPERATIVE TRANSESOPHAGEAL ECHOCARDIOGRAM N/A 07/07/2016   Procedure: INTRAOPERATIVE TRANSESOPHAGEAL ECHOCARDIOGRAM;  Surgeon: Gaye Pollack, MD;  Location: [facility];  Service: Open Heart Surgery;  Laterality: N/A;  . SHOULDER SURGERY       Current Outpatient Prescriptions  Medication Sig Dispense Refill  . acyclovir (ZOVIRAX) 400 MG tablet Per patient, when she has an outbreak, takes two orally for one day    . aspirin EC 325 MG EC tablet Take 1 tablet (325 mg total) by mouth daily. 30 tablet 0  . cephALEXin (KEFLEX) 500 MG capsule Take 500 mg by mouth 4 (four) times daily.    [name] levothyroxine (SYNTHROID, LEVOTHROID) 50 MCG tablet Take 50 mcg by mouth daily.    [name] lisinopril (PRINIVIL,ZESTRIL) 10 MG tablet Take 1 tablet (10 mg total) by mouth daily. 30 tablet 1  . Metoprolol Tartrate 37.5 MG TABS Take 37.5 mg by mouth 2 (two) times daily. 60 tablet 1  . pantoprazole (PROTONIX) 40 MG tablet Take 40 mg by mouth daily.    [name] rOPINIRole (REQUIP) 0.5 MG tablet Take 0.5 mg by mouth at bedtime.    . traMADol (ULTRAM) 50 MG tablet Take 1 tablet (50  mg total) by mouth every 6 (six) hours as needed for severe pain. 28 tablet 0   No current facility-administered medications for this visit.     Allergies:   Penicillins; Lincomycin; Naproxen sodium; Statins; Furosemide; Latex; and Tape   Social History:  The patient  reports that she has never smoked. She has never used smokeless tobacco. She reports that she does not drink alcohol or use drugs.   Family History:  The patient's family history includes CAD in her father; Coronary artery disease in her brother and sister; Stroke in her mother.    ROS:  Please see the history of present illness.   Otherwise, review  of systems is positive for fatigue.   All other systems are reviewed and negative.    PHYSICAL EXAM: VS:  BP 130/82   Pulse 76   Ht 4\' 10"  (1.473 m)   Wt 117 lb 12.8 oz (53.4 kg)   BMI 24.62 kg/m  , BMI Body mass index is 24.62 kg/m. GEN: Well nourished, well developed, in no acute distress  HEENT: normal  Neck: no JVD, carotid bruits, or masses Cardiac: RRR; no murmurs, rubs, or gallops,no edema  Respiratory:  clear to auscultation bilaterally, normal work of breathing GI: soft, nontender, nondistended, + BS MS: no deformity or atrophy  Skin: warm and dry Neuro:  Strength and sensation are intact Psych: euthymic mood, full affect  EKG:  EKG is ordered today. Personal review of the ekg ordered shows sinus rhythm, rate 76, diffuse T wave abnormalities  Recent Labs: 07/05/2016: TSH 1.259 07/07/2016: ALT 15 07/08/2016: Magnesium 2.3 07/10/2016: BUN 11; Creatinine, Ser 0.62; Hemoglobin 8.1; Platelets 176; Potassium 3.9; Sodium 133    Lipid Panel     Component Value Date/Time   CHOL 313 (H) 07/05/2016 0353   TRIG 185 (H) 07/05/2016 0353   HDL 59 07/05/2016 0353   CHOLHDL 5.3 07/05/2016 0353   VLDL 37 07/05/2016 0353   LDLCALC 217 (H) 07/05/2016 0353     Wt Readings from Last 3 Encounters:  07/27/16 117 lb 12.8 oz (53.4 kg)  07/13/16 121 lb 11.2 oz (55.2 kg)      Other studies Reviewed: Additional studies/ records that were reviewed today include: TEE 07/07/16  Review of the above records today demonstrates:   - Left ventricle: The cavity size was normal. Wall thickness was   normal. Systolic function was normal. The estimated ejection   fraction was in the range of 60% to 65%. Wall motion was normal;   there were no regional wall motion abnormalities. - Aortic valve: There was mild regurgitation. - Mitral valve: There was mild regurgitation. - Left atrium: No evidence of thrombus in the atrial cavity or   appendage. - Right atrium: No evidence of thrombus in the atrial  cavity or   appendage. - Atrial septum: No defect or patent foramen ovale was identified.   Echo contrast study showed no right-to-left atrial level shunt,   following an increase in RA pressure induced by provocative   maneuvers.  TTE 07/05/16 - Left ventricle: The cavity size was normal. Wall thickness was   normal. Systolic function was normal. The estimated ejection   fraction was in the range of 60% to 65%. LV mid-ventricle false   tendon. Wall motion was normal; there were no regional wall   motion abnormalities. Doppler parameters are consistent with   abnormal left ventricular relaxation (grade 1 diastolic   dysfunction). The E/e&' ratio is between 8-15, suggesting   indeterminate  LV filling pressure. - Aortic valve: Sclerosis without stenosis. There was mild   regurgitation. - Mitral valve: Mildly thickened leaflets . There was mild   regurgitation. - Left atrium: The atrium was normal in size. - Inferior vena cava: The vessel was normal in size. The   respirophasic diameter changes were in the normal range (>= 50%),   consistent with normal central venous pressure.   ASSESSMENT AND PLAN:  1.  Coronary artery disease: s/p CABG x5. Currently in rehab without major issues.  On aspirin.  Katherine Hall continue with cardiac rehab.  2. Hypertension: on metoprolol, lasix, and lisinopril. Well controlled today.  3. Hyperlipidemia: currently elevated not on a statin.  Has tried multiple statins in the past with significant side effects.  She has been on Repatha, but her insurance stopped covering it.  Katherine Hall therefore refer her to lipid clinic to see if she would qualify at this point as she does need aggressive lipid management.   Current medicines are reviewed at length with the patient today.   The patient does not have concerns regarding her medicines.  The following changes were made today:  none  Labs/ tests ordered today include:  No orders of the defined types were placed in this  encounter.    Disposition:   FU with [facility] 3 months  Signed, Edin Kon Meredith Leeds, MD  07/27/2016 3:57 PM     [place] [address] [place] [address] [phone] (office) [phone] (fax)

## 2016-08-02 ENCOUNTER — Telehealth: Payer: Self-pay | Admitting: *Deleted

## 2016-08-02 ENCOUNTER — Other Ambulatory Visit: Payer: Self-pay | Admitting: Surgery

## 2016-08-02 DIAGNOSIS — Z951 Presence of aortocoronary bypass graft: Secondary | ICD-10-CM

## 2016-08-02 NOTE — Telephone Encounter (Signed)
erroneous entry 

## 2016-08-02 NOTE — Telephone Encounter (Signed)
Minette Headland, physical therapist for home health, called to relate some new symptoms for Mrs. Mathey. She said that she began having chest pain this morning around 9 am that has been constant but unlike the chest pain the lead to her heart surgery.  Also she had diarrhea yesterday and an episode of sweating where all her clothes and bedding were wet. The diarrhea has subsided and feeling better in that regards. I told her that I would call Mrs. Deroy. When I did she was at Dr. Venetia Maxon office because she was having blood in her stools. I asked her about her chest pain.  She said it wasn't as constant, but it was still there. I advised her to go to the ED as soon as she could and she agreed.

## 2016-08-03 ENCOUNTER — Other Ambulatory Visit: Payer: Self-pay

## 2016-08-03 ENCOUNTER — Ambulatory Visit (INDEPENDENT_AMBULATORY_CARE_PROVIDER_SITE_OTHER): Payer: Self-pay | Admitting: Surgery

## 2016-08-03 ENCOUNTER — Emergency Department (HOSPITAL_BASED_OUTPATIENT_CLINIC_OR_DEPARTMENT_OTHER)
Admission: EM | Admit: 2016-08-03 | Discharge: 2016-08-03 | Disposition: A | Discharge status: Home / Self Care | Payer: Medicare Other | Attending: Emergency Medicine | Admitting: Emergency Medicine

## 2016-08-03 ENCOUNTER — Encounter (HOSPITAL_BASED_OUTPATIENT_CLINIC_OR_DEPARTMENT_OTHER): Payer: Self-pay | Admitting: Emergency Medicine

## 2016-08-03 ENCOUNTER — Emergency Department (HOSPITAL_BASED_OUTPATIENT_CLINIC_OR_DEPARTMENT_OTHER): Payer: Medicare Other

## 2016-08-03 ENCOUNTER — Other Ambulatory Visit: Payer: Self-pay | Admitting: *Deleted

## 2016-08-03 ENCOUNTER — Encounter: Payer: Self-pay | Admitting: Surgery

## 2016-08-03 VITALS — BP 157/85 | HR 82 | Resp 16 | Ht <= 58 in | Wt 115.0 lb

## 2016-08-03 DIAGNOSIS — J90 Pleural effusion, not elsewhere classified: Secondary | ICD-10-CM | POA: Diagnosis not present

## 2016-08-03 DIAGNOSIS — Z79899 Other long term (current) drug therapy: Secondary | ICD-10-CM | POA: Insufficient documentation

## 2016-08-03 DIAGNOSIS — E039 Hypothyroidism, unspecified: Secondary | ICD-10-CM | POA: Insufficient documentation

## 2016-08-03 DIAGNOSIS — I2510 Atherosclerotic heart disease of native coronary artery without angina pectoris: Secondary | ICD-10-CM

## 2016-08-03 DIAGNOSIS — Z951 Presence of aortocoronary bypass graft: Secondary | ICD-10-CM

## 2016-08-03 DIAGNOSIS — R0789 Other chest pain: Secondary | ICD-10-CM

## 2016-08-03 DIAGNOSIS — R079 Chest pain, unspecified: Secondary | ICD-10-CM | POA: Diagnosis present

## 2016-08-03 DIAGNOSIS — I10 Essential (primary) hypertension: Secondary | ICD-10-CM | POA: Insufficient documentation

## 2016-08-03 LAB — CBC WITH DIFFERENTIAL/PLATELET
Basophils Absolute: 0.1 10*3/uL (ref 0.0–0.1)
Basophils Relative: 1 %
Eosinophils Absolute: 0.7 10*3/uL (ref 0.0–0.7)
Eosinophils Relative: 9 %
HCT: 34.4 % — ABNORMAL LOW (ref 36.0–46.0)
Hemoglobin: 11.3 g/dL — ABNORMAL LOW (ref 12.0–15.0)
Lymphocytes Relative: 30 %
Lymphs Abs: 2.3 10*3/uL (ref 0.7–4.0)
MCH: 31 pg (ref 26.0–34.0)
MCHC: 32.8 g/dL (ref 30.0–36.0)
MCV: 94.5 fL (ref 78.0–100.0)
Monocytes Absolute: 0.8 10*3/uL (ref 0.1–1.0)
Monocytes Relative: 10 %
Neutro Abs: 4 10*3/uL (ref 1.7–7.7)
Neutrophils Relative %: 50 %
Platelets: 647 10*3/uL — ABNORMAL HIGH (ref 150–400)
RBC: 3.64 MIL/uL — ABNORMAL LOW (ref 3.87–5.11)
RDW: 16.6 % — ABNORMAL HIGH (ref 11.5–15.5)
WBC: 7.8 10*3/uL (ref 4.0–10.5)

## 2016-08-03 LAB — BASIC METABOLIC PANEL
Anion gap: 8 (ref 5–15)
BUN: 11 mg/dL (ref 6–20)
CO2: 23 mmol/L (ref 22–32)
Calcium: 9.1 mg/dL (ref 8.9–10.3)
Chloride: 105 mmol/L (ref 101–111)
Creatinine, Ser: 0.63 mg/dL (ref 0.44–1.00)
GFR calc Af Amer: >60 mL/min (ref >60)
GFR calc non Af Amer: >60 mL/min (ref >60)
Glucose, Bld: 105 mg/dL — ABNORMAL HIGH (ref 65–99)
Potassium: 3.6 mmol/L (ref 3.5–5.1)
Sodium: 136 mmol/L (ref 135–145)

## 2016-08-03 LAB — TROPONIN I: Troponin I: <0.03 ng/mL (ref <0.03)

## 2016-08-03 MED ORDER — POTASSIUM CHLORIDE CRYS ER 10 MEQ PO TBCR: 20.0000 meq | EXTENDED_RELEASE_TABLET | Freq: Two times a day (BID) | ORAL | 0 refills | Status: DC

## 2016-08-03 MED ORDER — FUROSEMIDE 40 MG PO TABS: 40.0000 mg | ORAL_TABLET | Freq: Every day | ORAL | 0 refills | Status: DC

## 2016-08-03 NOTE — ED Triage Notes (Signed)
Chest pain since yesterday morning call to cardiologist Mackel[place]aine Pt has open heart surgery 07/07/2016 with 5 vessel bypass

## 2016-08-03 NOTE — ED Notes (Signed)
Patient return from X-ray 

## 2016-08-03 NOTE — ED Provider Notes (Signed)
[facility] Provider Note   CSN: [id number] Arrival date & time: 08/03/16  0226     History   Chief Complaint Chief Complaint  Patient presents with  . Chest Pain    HPI Katherine Hall is a 75 y.o. female underwent a CABG for 5 vessel coronary artery disease on August 3 of this year. She is here with chest pain that began the evening before last. She describes the chest pain as burning and not severe. It is located in her left chest, at a very localized spot left of her lower sternum without radiation. It is not constant. It occurs intermittently and is fleeting, lasting about 2-3 seconds at a time. These episodes occur several times an hour. There are no specific exacerbating or mitigating factors. When asked if she is short of breath with these episodes she replied "sort of". On one occasion she got clammy. She denies nausea or vomiting. She is only on aspirin for anticoagulation.  HPI  Past Medical History:  Diagnosis Date  . Anemia   . Arthritis   . Gastric ulcer    a. h/o bleeding ulcer 2009 requiring 3 pints of blood.  [name] History of removal of cyst    a. 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  [name] HLD (hyperlipidemia)    a. h/o intolerance of statins, prev on Repatha but insurance stopped covering.  [name] HTN (hypertension)     Patient Active Problem List   Diagnosis Date Noted  . 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 Anemia, unspecified 07/05/2016  . Hyponatremia 07/05/2016  . Chest pain 07/05/2016  . Pain in the chest   . Hypothyroidism 07/04/2016  . HTN (hypertension) 07/04/2016  . Hyperlipidemia 07/04/2016  . Unstable angina ([place]) 07/03/2016    Past Surgical History:  Procedure Laterality Date  . ABDOMINAL HYSTERECTOMY    . APPENDECTOMY    . CARDIAC CATHETERIZATION N/A 07/05/2016   Procedure: Left Heart Cath and Coronary Angiography;  Surgeon: Burnell Blanks, MD;  Location: [facility];  Service: Cardiovascular;   Laterality: N/A;  . CARDIAC SURGERY     cyst, not on heart.  . CORONARY ARTERY BYPASS GRAFT N/A 07/07/2016   Procedure: CORONARY ARTERY BYPASS GRAFTING (CABG) x 5 with endoscopic harvesting of the right greater saphenous vein;  Surgeon: Gaye Pollack, MD;  Location: [facility];  Service: Open Heart Surgery;  Laterality: N/A;  . HEMORRHOID SURGERY    . INTRAOPERATIVE TRANSESOPHAGEAL ECHOCARDIOGRAM N/A 07/07/2016   Procedure: INTRAOPERATIVE TRANSESOPHAGEAL ECHOCARDIOGRAM;  Surgeon: Gaye Pollack, MD;  Location[facility];  Service: Open Heart Surgery;  Laterality: N/A;  . SHOULDER SURGERY      OB History    No data available       Home Medications    Prior to Admission medications   Medication Sig Start Date End Date Taking? Authorizing Provider  acyclovir (ZOVIRAX) 400 MG tablet Per patient, when she has an outbreak, takes two orally for one day 07/13/16   Nani Skillern, PA-C  aspirin EC 325 MG EC tablet Take 1 tablet (325 mg total) by mouth daily. 07/13/16   Donielle Liston Alba, PA-C  cephALEXin (KEFLEX) 500 MG capsule Take 500 mg by mouth 4 (four) times daily. 07/25/16   Historical Provider, MD  levothyroxine (SYNTHROID, LEVOTHROID) 50 MCG tablet Take 50 mcg by mouth daily. 03/31/16   Historical Provider, MD  lisinopril (PRINIVIL,ZESTRIL) 10 MG tablet Take 1 tablet (10 mg total)  by mouth daily. 07/13/16   Donielle Liston Alba, PA-C  Metoprolol Tartrate 37.5 MG TABS Take 37.5 mg by mouth 2 (two) times daily. 07/13/16   Donielle Liston Alba, PA-C  pantoprazole (PROTONIX) 40 MG tablet Take 40 mg by mouth daily. 03/31/16   Historical Provider, MD  rOPINIRole (REQUIP) 0.5 MG tablet Take 0.5 mg by mouth at bedtime. 05/11/16   Historical Provider, MD  traMADol (ULTRAM) 50 MG tablet Take 1 tablet (50 mg total) by mouth every 6 (six) hours as needed for severe pain. 07/13/16   Donielle Liston Alba, PA-C    Family History Family History  Problem Relation Age of Onset  . Stroke Mother     Stroke age 83  .  CAD Father     died of MI age 34  . Coronary artery disease Sister     one sister had massive MI at 60, another sister has 60 stents  . Coronary artery disease Brother     one brother had MI at age 28 and died at 78, another has had 4 MIs and 2 bypasses    Social History Social History  Substance Use Topics  . Smoking status: Never Smoker  . Smokeless tobacco: Never Used  . Alcohol use No     Allergies   Penicillins; Lincomycin; Naproxen sodium; Statins; Furosemide; Latex; and Tape   Review of Systems Review of Systems  All other systems reviewed and are negative.   Physical Exam Updated Vital Signs BP 154/84   Pulse 77   Temp 98.2 F (36.8 C) (Oral)   Resp 19   Ht 4\' 10"  (1.473 m)   Wt 118 lb (53.5 kg)   SpO2 98%   BMI 24.66 kg/m   Physical Exam General: Well-developed, well-nourished female in no acute distress; appearance consistent with age of record HENT: normocephalic; atraumatic Eyes: pupils equal, round and reactive to light; extraocular muscles intact Neck: supple Heart: regular rate and rhythm; no murmur Lungs: Decreased sounds in left base Chest: Well healing midline sternotomy incision with mild tenderness to palpation that does not reproduce the pain of the chief complaint Abdomen: soft; nondistended; nontender; no masses or hepatosplenomegaly; bowel sounds present Extremities: No deformity; full range of motion; pulses normal; ecchymoses of right lower extremity in a pattern consistent with recent saphenous graft vein harvest; no calf tenderness or edema Neurologic: Awake, alert and oriented; motor function intact in all extremities and symmetric; no facial droop Skin: Warm and dry Psychiatric: Normal mood and affect    ED Treatments / Results  Nursing notes and vitals signs, including pulse oximetry, reviewed.  Summary of this visit's results, reviewed by myself:  Labs:  Results for orders placed or performed during the hospital encounter of  08/03/16 (from the past 24 hour(s))  CBC with Differential/Platelet     Status: Abnormal   Collection Time: 08/03/16  2:35 AM  Result Value Ref Range   WBC 7.8 4.0 - 10.5 K/uL   RBC 3.64 (L) 3.87 - 5.11 MIL/uL   Hemoglobin 11.3 (L) 12.0 - 15.0 g/dL   HCT 34.4 (L) 36.0 - 46.0 %   MCV 94.5 78.0 - 100.0 fL   MCH 31.0 26.0 - 34.0 pg   MCHC 32.8 30.0 - 36.0 g/dL   RDW 16.6 (H) 11.5 - 15.5 %   Platelets 647 (H) 150 - 400 K/uL   Neutrophils Relative % 50 %   Neutro Abs 4.0 1.7 - 7.7 K/uL   Lymphocytes Relative 30 %  Lymphs Abs 2.3 0.7 - 4.0 K/uL   Monocytes Relative 10 %   Monocytes Absolute 0.8 0.1 - 1.0 K/uL   Eosinophils Relative 9 %   Eosinophils Absolute 0.7 0.0 - 0.7 K/uL   Basophils Relative 1 %   Basophils Absolute 0.1 0.0 - 0.1 K/uL  Basic metabolic panel     Status: Abnormal   Collection Time: 08/03/16  2:35 AM  Result Value Ref Range   Sodium 136 135 - 145 mmol/L   Potassium 3.6 3.5 - 5.1 mmol/L   Chloride 105 101 - 111 mmol/L   CO2 23 22 - 32 mmol/L   Glucose, Bld 105 (H) 65 - 99 mg/dL   BUN 11 6 - 20 mg/dL   Creatinine, Ser 0.63 0.44 - 1.00 mg/dL   Calcium 9.1 8.9 - 10.3 mg/dL   GFR calc non Af Amer >60 >60 mL/min   GFR calc Af Amer >60 >60 mL/min   Anion gap 8 5 - 15  Troponin I     Status: None   Collection Time: 08/03/16  2:35 AM  Result Value Ref Range   Troponin I <0.03 <0.03 ng/mL    Imaging Studies: Dg Chest 2 View  Result Date: 08/03/2016 CLINICAL DATA:  Left chest pain for 2 days. Shortness of breath. Coronary artery bypass grafting on 07/17/2016. EXAM: CHEST  2 VIEW COMPARISON:  07/11/2016 FINDINGS: Postoperative changes in the mediastinum. Moderate size left pleural effusion, increasing since previous study. Right pleural effusion is decreased since the previous study. Atelectasis persists in both lung bases. No pneumothorax. Mediastinal contours appear intact. Heart size is obscured by the left effusion but appears grossly normal. Calcification of the  aorta. Degenerative changes in the shoulders. IMPRESSION: Bibasilar atelectasis. Bilateral pleural effusions, left increasing and right decreasing since prior study. Electronically Signed   By: Lucienne Capers M.D.   On: 08/03/2016 03:56     EKG  EKG Interpretation  Date/Time:  Wednesday August 03 2016 02:52:06 EDT Ventricular Rate:  75 PR Interval:  168 QRS Duration: 82 QT Interval:  410 QTC Calculation: 457 R Axis:   43 Text Interpretation:  Normal sinus rhythm Cannot rule out Anterior infarct , age undetermined Abnormal ECG T-wave inversion in V2 not seen previously Confirmed by Genevra Orne  MD, Jenny Reichmann ([id number]) on 08/03/2016 2:55:22 AM      4:11 AM The patient has had a few episodes of this brief, well localized chest pain while in the ED. Apart from increased left pleural effusion or workup has been reassuring. These pains may be associated with that pleural effusion. She has an appointment this morning with Dr. Cyndia Bent, her surgeon, at 10 AM.  Procedures (including critical care time)   Final Clinical Impressions(s) / ED Diagnoses   Final diagnoses:  Atypical chest pain  Pleural effusion on left      Shanon Rosser, MD 08/03/16 [phone]

## 2016-08-03 NOTE — ED Notes (Signed)
Patient transported to X-ray 

## 2016-08-04 NOTE — Progress Notes (Signed)
Patient ID: Katherine Hall                 DOB: 06-09-1941                    MRN: [id number]     HPI: Katherine Hall is a 75 y.o. female patient referred to lipid clinic by Dr. Curt Bears. Her PMH is significant for HTN, HLD, and CAD s/p CABG. Cath done 07/05/16 after presentation for unstable angina which showed multivessel CAD and now s/p CABG x5 on 07/07/16. She has intolerances to multiple statins, and had been on Repatha, but her insurance stopped covering it. She presents to clinic today for further cholesterol management.   Patient reports intolerances to Lipitor, Crestor, Livalo, Zocor, Pravachol, and Zetia. She experienced nausea, weakness and myalgias that made her bed-bound. Her symptoms presented within a week of drug initiation and symptoms disappeared with drug discontinuation in each case.    Used to be on Repatha while in the clinical outcomes trial - her PCP Dr. Reita Hall helped with this process.   Current Medications: none Intolerances: Crestor, Lipitor, Livalo, Zocor, Pravachol, Zetia - myalgias, fatigue, and nausea. No record of dosing available Risk Factors: ASCVD s/p CABG x5v, HTN, age, strong family history of early CVD LDL goal: <70 mg/dL  Diet: Avoids fatty foods.  Exercise: Has been square dancing with her husband since the 14s.   Family History: Brother was 41 when he had a heart attack, sister died from MI at 35. Dad died at age 1 from MI. Younger sister with 13 stents. Younger brother with 2 open heart surgeries and 4 stents.   CAD in her father, her brother and sister; stroke in her mother at age 40.  Social History: Never smoker. Reports never used smokeless tobacco, does not drink alcohol or use illicit drugs.   Labs: (07/05/2016) TC 313, TG 185, HDL 59, LDL 217, AST 13, ALT 13 (no therapy)  Past Medical History:  Diagnosis Date  . Anemia   . Arthritis   . Gastric ulcer    a. h/o bleeding ulcer 2009 requiring 3 pints of blood.  [name] History of removal of  cyst    a. 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  [name] HLD (hyperlipidemia)    a. h/o intolerance of statins, prev on Repatha but insurance stopped covering.  [name] HTN (hypertension)     Current Outpatient Prescriptions on File Prior to Visit  Medication Sig Dispense Refill  . acyclovir (ZOVIRAX) 400 MG tablet Per patient, when she has an outbreak, takes two orally for one day    . aspirin EC 325 MG EC tablet Take 1 tablet (325 mg total) by mouth daily. 30 tablet 0  . cephALEXin (KEFLEX) 500 MG capsule Take 500 mg by mouth 4 (four) times daily.    . furosemide (LASIX) 40 MG tablet Take 1 tablet (40 mg total) by mouth daily. 7 tablet 0  . levothyroxine (SYNTHROID, LEVOTHROID) 50 MCG tablet Take 50 mcg by mouth daily.    [name] lisinopril (PRINIVIL,ZESTRIL) 10 MG tablet Take 1 tablet (10 mg total) by mouth daily. 30 tablet 1  . Metoprolol Tartrate 37.5 MG TABS Take 37.5 mg by mouth 2 (two) times daily. 60 tablet 1  . pantoprazole (PROTONIX) 40 MG tablet Take 40 mg by mouth daily.    . potassium chloride (K-DUR,KLOR-CON) 10 MEQ tablet Take 2 tablets (20 mEq total) by mouth 2 (two) times daily. [place]  tablet 0  . rOPINIRole (REQUIP) 0.5 MG tablet Take 0.5 mg by mouth at bedtime.    . traMADol (ULTRAM) 50 MG tablet Take 1 tablet (50 mg total) by mouth every 6 (six) hours as needed for severe pain. 28 tablet 0   No current facility-administered medications on file prior to visit.     Allergies  Allergen Reactions  . Penicillins Anaphylaxis    Has patient had a PCN reaction causing immediate rash, facial/tongue/throat swelling, SOB or lightheadedness with hypotension: Yes Has patient had a PCN reaction causing severe rash involving mucus membranes or skin necrosis: No Has patient had a PCN reaction that required hospitalization Yes Has patient had a PCN reaction occurring within the last 10 years: No If all of the above answers are "NO", then may proceed with Cephalosporin  use.   . Lincomycin Other (See Comments)    Rated Medium reactiom PTA  . Naproxen Sodium Other (See Comments)    Aleve  . Statins Other (See Comments)    Class reaction mylagias and general weakness.  Tried multiple statins.  . Latex Other (See Comments)    Blisters when in contact with skin  . Tape     Assessment/Plan:  1. Hyperlipidemia with ASCVD: Most recent LDL is 217mg /dL, far above goal < 70mg /dL. Pt is intolerant to 5 statins and Zetia. Patient needs aggressive lipid lowering therapy. Discussed PCSK9i with patient and will initiate paperwork for Repatha coverage.   Megan E. Supple, PharmD, [place] [id number] [address], [place], [place] [address] Phone: [phone]; Fax: [phone] 08/05/2016 11:46 AM   Addendum: Received fax ~1 hour after clinic visit with Repatha approval for 6 months. Rx sent to [facility].

## 2016-08-05 ENCOUNTER — Encounter: Payer: Self-pay | Admitting: Surgery

## 2016-08-05 ENCOUNTER — Ambulatory Visit (INDEPENDENT_AMBULATORY_CARE_PROVIDER_SITE_OTHER): Payer: Medicare Other | Admitting: Pharmacist

## 2016-08-05 DIAGNOSIS — E785 Hyperlipidemia, unspecified: Secondary | ICD-10-CM

## 2016-08-05 MED ORDER — EVOLOCUMAB 140 MG/ML ~~LOC~~ SOAJ: 1.0000 "pen " | SUBCUTANEOUS | 11 refills | Status: DC

## 2016-08-05 NOTE — Progress Notes (Signed)
HPI: Patient returns for routine postoperative follow-up having undergone CABG x 5  on 07/07/2016. The patient's early postoperative recovery while in the hospital was notable for an uncomplicated postop course. Since hospital discharge the patient reports that she was feeling fine but on Monday developed intermittent pain in her left chest anteriorly that lasts for a few seconds. It is not related to activity but seems to be worse with coughing and deep breathing. She is concerned that she might have recurrent angina although the pains have been different and fleeting. She is feeling well otherwise. She went to the ED on 8/30 and an ECG was unremarkable and troponin was negative.    Current Outpatient Prescriptions  Medication Sig Dispense Refill  . acyclovir (ZOVIRAX) 400 MG tablet Per patient, when she has an outbreak, takes two orally for one day    . aspirin EC 325 MG EC tablet Take 1 tablet (325 mg total) by mouth daily. 30 tablet 0  . cephALEXin (KEFLEX) 500 MG capsule Take 500 mg by mouth 4 (four) times daily.    [name] levothyroxine (SYNTHROID, LEVOTHROID) 50 MCG tablet Take 50 mcg by mouth daily.    [name] lisinopril (PRINIVIL,ZESTRIL) 10 MG tablet Take 1 tablet (10 mg total) by mouth daily. 30 tablet 1  . Metoprolol Tartrate 37.5 MG TABS Take 37.5 mg by mouth 2 (two) times daily. 60 tablet 1  . pantoprazole (PROTONIX) 40 MG tablet Take 40 mg by mouth daily.    [name] rOPINIRole (REQUIP) 0.5 MG tablet Take 0.5 mg by mouth at bedtime.    . traMADol (ULTRAM) 50 MG tablet Take 1 tablet (50 mg total) by mouth every 6 (six) hours as needed for severe pain. 28 tablet 0  . furosemide (LASIX) 40 MG tablet Take 1 tablet (40 mg total) by mouth daily. 7 tablet 0  . potassium chloride (K-DUR,KLOR-CON) 10 MEQ tablet Take 2 tablets (20 mEq total) by mouth 2 (two) times daily. 28 tablet 0   No current facility-administered medications for this visit.     Physical Exam: BP (!) 157/85   Pulse 82   Resp  16   Ht 4\' 10"  (1.473 m)   Wt 115 lb (52.2 kg)   SpO2 98% Comment: ON RA  BMI 24.04 kg/m  She looks well. Lung exam reveals decreased breath sounds at the left base. Cardiac exam shows a regular rate and rhythm with normal heart sounds. Chest incision is healing well and sternum is stable. The leg incisions are healing well and there is mild peripheral edema in the RLE.    Diagnostic Tests:  CLINICAL DATA:  Left chest pain for 2 days. Shortness of breath. Coronary artery bypass grafting on 07/17/2016.  EXAM: CHEST  2 VIEW  COMPARISON:  07/11/2016  FINDINGS: Postoperative changes in the mediastinum. Moderate size left pleural effusion, increasing since previous study. Right pleural effusion is decreased since the previous study. Atelectasis persists in both lung bases. No pneumothorax. Mediastinal contours appear intact. Heart size is obscured by the left effusion but appears grossly normal. Calcification of the aorta. Degenerative changes in the shoulders.  IMPRESSION: Bibasilar atelectasis. Bilateral pleural effusions, left increasing and right decreasing since prior study.   Electronically Signed   By: Lucienne Capers M.D.   On: 08/03/2016 03:56      Impression:  Overall I think she is doing well. She does have a moderate sized left pleural effusion that could be giving her some chest pain. 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 She has some edema in the right leg which is where she had vein harvested. She is not having any significant shortness of breath so I will try a course of Lasix to try to resolve the effusion. Her pain does not sound anginal but sounds more pleuritic.  Plan:  Will start Lasix 40 mg daily and Kdur 20 meq bid. She will return in one week with a CXR.   Gaye Pollack, MD [facility] [phone]

## 2016-08-10 ENCOUNTER — Other Ambulatory Visit: Payer: Self-pay | Admitting: *Deleted

## 2016-08-10 ENCOUNTER — Ambulatory Visit (INDEPENDENT_AMBULATORY_CARE_PROVIDER_SITE_OTHER): Payer: Self-pay | Admitting: Surgery

## 2016-08-10 ENCOUNTER — Ambulatory Visit
Admission: RE | Admit: 2016-08-10 | Discharge: 2016-08-10 | Disposition: A | Discharge status: Home / Self Care | Payer: Medicare Other | Source: Ambulatory Visit | Attending: Surgery | Admitting: Surgery

## 2016-08-10 ENCOUNTER — Encounter: Payer: Self-pay | Admitting: Surgery

## 2016-08-10 VITALS — BP 113/75 | HR 88 | Resp 16 | Ht <= 58 in | Wt 113.0 lb

## 2016-08-10 DIAGNOSIS — Z951 Presence of aortocoronary bypass graft: Secondary | ICD-10-CM

## 2016-08-10 DIAGNOSIS — J90 Pleural effusion, not elsewhere classified: Secondary | ICD-10-CM

## 2016-08-10 DIAGNOSIS — I2510 Atherosclerotic heart disease of native coronary artery without angina pectoris: Secondary | ICD-10-CM

## 2016-08-10 DIAGNOSIS — J948 Other specified pleural conditions: Secondary | ICD-10-CM

## 2016-08-10 MED ORDER — POTASSIUM CHLORIDE CRYS ER 10 MEQ PO TBCR: 20.0000 meq | EXTENDED_RELEASE_TABLET | Freq: Every day | ORAL | 0 refills | Status: DC

## 2016-08-10 MED ORDER — FUROSEMIDE 20 MG PO TABS
40.0000 mg | ORAL_TABLET | Freq: Every day | ORAL | 0 refills | Status: DC
Start: 2016-08-10 — End: 2016-08-24

## 2016-08-11 ENCOUNTER — Encounter: Payer: Self-pay | Admitting: Surgery

## 2016-08-11 NOTE — Progress Notes (Signed)
HPI:  Patient returns for postoperative follow-up having undergone CABG x 5  on 07/07/2016. I saw her last week for her first postop visit and her CXR showed a moderate left pleural effusion that was larger than on her previous CXR. She was complaining of some left sided chest pain that may have been pleuritic. She was treated with a 5 day course of diuretics and says that her breathing is better and her pain resolved. She has mild residual incisional discomfort.  Current Outpatient Prescriptions  Medication Sig Dispense Refill  . acyclovir (ZOVIRAX) 400 MG tablet Per patient, when she has an outbreak, takes two orally for one day    . aspirin EC 81 MG tablet Take 81 mg by mouth daily.    . Evolocumab (REPATHA SURECLICK) XX123456 MG/ML SOAJ Inject 1 pen into the skin every 14 (fourteen) days. 2 pen 11  . levothyroxine (SYNTHROID, LEVOTHROID) 50 MCG tablet Take 50 mcg by mouth daily.    [name] lisinopril (PRINIVIL,ZESTRIL) 10 MG tablet Take 1 tablet (10 mg total) by mouth daily. 30 tablet 1  . Metoprolol Tartrate 37.5 MG TABS Take 37.5 mg by mouth 2 (two) times daily. 60 tablet 1  . pantoprazole (PROTONIX) 40 MG tablet Take 40 mg by mouth daily.    [name] rOPINIRole (REQUIP) 0.5 MG tablet Take 0.5 mg by mouth at bedtime.    . traMADol (ULTRAM) 50 MG tablet Take 1 tablet (50 mg total) by mouth every 6 (six) hours as needed for severe pain. 28 tablet 0  . furosemide (LASIX) 20 MG tablet Take 2 tablets (40 mg total) by mouth daily. for 5 days only 10 tablet 0  . potassium chloride (K-DUR,KLOR-CON) 10 MEQ tablet Take 2 tablets (20 mEq total) by mouth daily. for 5 days only 10 tablet 0   No current facility-administered medications for this visit.      Physical Exam: BP 113/75   Pulse 88   Resp 16   Ht 4\' 10"  (1.473 m)   Wt 113 lb (51.3 kg)   SpO2 98% Comment: ON RA  BMI 23.62 kg/m  She looks well. Lung exam reveals slight decreased breath sounds at the left base but improved from last week Cardiac  exam shows a regular rate and rhythm with normal heart sounds. Chest incision is healing well and sternum is stable. The leg incisions are healing well and there is mild peripheral edema in the RLE.   Diagnostic Tests:  CLINICAL DATA:  Previous CABG August 2017. Shortness of breath and chest pain presently.  EXAM: CHEST  2 VIEW  COMPARISON:  08/03/2016  FINDINGS: Previous median sternotomy and CABG. Heart size is normal. There are small residual effusions, left larger than right, with mild basilar volume loss. This is improved since the previous study. Upper lungs are clear. No acute bone finding. Chronic postoperative changes of the right shoulder.  IMPRESSION: Status post recent CABG. Diminishing effusions, larger on the left than the right with associated basilar atelectasis. No worsening or new finding.   Electronically Signed   By: Nelson Chimes M.D.   On: 08/10/2016 10:41   Impression:  The left pleural effusion is significantly improved after diuresis and she feels better. I think we should continue diuresis for another week to completely resolve her effusions.   Plan:  Lasix 40 mg daily and Kdur 20 meg daily for another 5 days prescribed. I encouraged her to continue walking and using her IS. I will see her back  in three weeks with a CXR.   Gaye Pollack, MD [facility] [phone]

## 2016-08-23 ENCOUNTER — Other Ambulatory Visit: Payer: Self-pay | Admitting: Surgery

## 2016-08-23 DIAGNOSIS — Z951 Presence of aortocoronary bypass graft: Secondary | ICD-10-CM

## 2016-08-24 ENCOUNTER — Ambulatory Visit (INDEPENDENT_AMBULATORY_CARE_PROVIDER_SITE_OTHER): Payer: Self-pay | Admitting: Surgery

## 2016-08-24 ENCOUNTER — Ambulatory Visit
Admission: RE | Admit: 2016-08-24 | Discharge: 2016-08-24 | Disposition: A | Discharge status: Home / Self Care | Payer: Medicare Other | Source: Ambulatory Visit | Attending: Surgery | Admitting: Surgery

## 2016-08-24 ENCOUNTER — Encounter: Payer: Self-pay | Admitting: Cardiovascular Disease

## 2016-08-24 VITALS — BP 100/62 | HR 79 | Resp 16 | Ht <= 58 in | Wt 110.0 lb

## 2016-08-24 DIAGNOSIS — J90 Pleural effusion, not elsewhere classified: Secondary | ICD-10-CM

## 2016-08-24 DIAGNOSIS — J948 Other specified pleural conditions: Secondary | ICD-10-CM

## 2016-08-24 DIAGNOSIS — Z951 Presence of aortocoronary bypass graft: Secondary | ICD-10-CM

## 2016-08-30 ENCOUNTER — Encounter: Payer: Self-pay | Admitting: Surgery

## 2016-08-30 NOTE — Progress Notes (Signed)
     HPI:  She returns today for follow up having undergone CABG x 5 on 07/07/2016. She has had problems with a moderate left pleural effusion postop that has responded to diuresis. She now feels well with no chest pain or shortness of breath when she ambulates.  Current Outpatient Prescriptions  Medication Sig Dispense Refill  . acyclovir (ZOVIRAX) 400 MG tablet Per patient, when she has an outbreak, takes two orally for one day    . aspirin EC 81 MG tablet Take 81 mg by mouth daily.    [name] levothyroxine (SYNTHROID, LEVOTHROID) 50 MCG tablet Take 50 mcg by mouth daily.    [name] lisinopril (PRINIVIL,ZESTRIL) 10 MG tablet Take 1 tablet (10 mg total) by mouth daily. 30 tablet 1  . Metoprolol Tartrate 37.5 MG TABS Take 37.5 mg by mouth 2 (two) times daily. 60 tablet 1  . pantoprazole (PROTONIX) 40 MG tablet Take 40 mg by mouth daily.    [name] rOPINIRole (REQUIP) 0.5 MG tablet Take 0.5 mg by mouth at bedtime.    . traMADol (ULTRAM) 50 MG tablet Take 1 tablet (50 mg total) by mouth every 6 (six) hours as needed for severe pain. 28 tablet 0  . Evolocumab (REPATHA SURECLICK) XX123456 MG/ML SOAJ Inject 1 pen into the skin every 14 (fourteen) days. (Patient not taking: Reported on 08/24/2016) 2 pen 11   No current facility-administered medications for this visit.      Physical Exam: BP 100/62   Pulse 79   Resp 16   Ht 4\' 10"  (1.473 m)   Wt 110 lb (49.9 kg)   SpO2 98% Comment: ON RA  BMI 22.99 kg/m  She looks well. Lung exam reveals slight decreased breath sounds at the left base. Cardiac exam shows a regular rate and rhythm with normal heart sounds. Chest incision is healing well and sternum is stable. The leg incisions are healing well and there is noperipheral edema.   Diagnostic Tests:  CLINICAL DATA:  History of CABG on 07/07/2016, now with left lower anterior chest pain for 4 days, no injury  EXAM: CHEST  2 VIEW  COMPARISON:  Chest x-ray of 08/10/2016  FINDINGS: There are small  pleural effusions present, left-greater-than-right with the effusions diminished somewhat in volume in the interval. The lungs appear slightly better aerated. No pneumothorax is seen. Heart size is stable. Median sternotomy sutures are noted from CABG.  IMPRESSION: Slightly better aeration with some decrease in volume of pleural effusions left still slightly greater than right   Electronically Signed   By: Ivar Drape M.D.   On: 08/24/2016 08:17   Impression:  She is doing well and the pleural effusions are resolving with diuresis. Her BP is borderline low and she has no edema so I am not going to continue diuretics at this time. I have stressed the importance of continued ambulation.   Plan:  She is going to follow up with Dr Acie Fredrickson in a few weeks.   Gaye Pollack, MD [facility] [phone]

## 2016-08-31 ENCOUNTER — Encounter: Payer: Medicare Other | Admitting: Surgery

## 2016-09-08 ENCOUNTER — Encounter: Payer: Self-pay | Admitting: Cardiovascular Disease

## 2016-09-08 ENCOUNTER — Ambulatory Visit (INDEPENDENT_AMBULATORY_CARE_PROVIDER_SITE_OTHER): Payer: Medicare Other | Admitting: Cardiovascular Disease

## 2016-09-08 ENCOUNTER — Encounter (INDEPENDENT_AMBULATORY_CARE_PROVIDER_SITE_OTHER): Payer: Self-pay

## 2016-09-08 VITALS — BP 148/84 | HR 58 | Ht <= 58 in | Wt 117.8 lb

## 2016-09-08 DIAGNOSIS — I200 Unstable angina: Secondary | ICD-10-CM

## 2016-09-08 DIAGNOSIS — I10 Essential (primary) hypertension: Secondary | ICD-10-CM

## 2016-09-08 DIAGNOSIS — Z951 Presence of aortocoronary bypass graft: Secondary | ICD-10-CM | POA: Diagnosis not present

## 2016-09-08 MED ORDER — METOPROLOL TARTRATE 25 MG PO TABS: 25.0000 mg | ORAL_TABLET | Freq: Two times a day (BID) | ORAL | 11 refills | Status: DC

## 2016-09-08 NOTE — Patient Instructions (Addendum)
Medication Instructions:  DECREASE Metoprolol (Lopressor) to 25 mg twice daily   Labwork: None Ordered   Testing/Procedures: None Ordered   Follow-Up: Your physician wants you to follow-up in: 3 months with Dr. Acie Fredrickson.  You will receive a reminder letter in the mail two months in advance. If you don't receive a letter, please call our office to schedule the follow-up appointment.   If you need a refill on your cardiac medications before your next appointment, please call your pharmacy.   Thank you for choosing [facility]! Christen Bame, RN [phone]

## 2016-09-08 NOTE — Progress Notes (Signed)
Cardiology Office Note   Date:  09/08/2016   ID:  Katherine Hall, DOB 1941-06-06, [name]  PCP:  [name], MD  Cardiologist:   Mertie Moores, MD , [facility]   Chief Complaint  Patient presents with  . Coronary Artery Disease   Problem list 1. Coronary artery disease-status post coronary artery bypass grafting-  CORONARY ARTERY BYPASS GRAFTING x 5 -LIMA to LAD -SVG to DIAGONAL -SVG to PDA -SEQ SVG OM1 and OM2  2. Hypertension 3. Hyperlipidemia 4. Anemia   History of Present Illness: Katherine Hall is a 75 y.o. female who presents for follow-up of her coronary artery disease. She had coronary artery bypass grafting in August.  She has had a moderate left pleural effusion Still gradually improving . Needs an order to do cardiac rehab.  Wants to do heart strides at [facility]   Breathing is better.  Still has some chest soreness,  The angina is better   Has very high lipids.   Cannot tolerate statins.  Has seen Megan Supple to initiate Repatha - working on that  Still sleeping in a recliner  Past Medical History:  Diagnosis Date  . Anemia   . Arthritis   . Gastric ulcer    a. h/o bleeding ulcer 2009 requiring 3 pints of blood.  [name] History of removal of cyst    a. 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  [name] HLD (hyperlipidemia)    a. h/o intolerance of statins, prev on Repatha but insurance stopped covering.  [name] HTN (hypertension)     Past Surgical History:  Procedure Laterality Date  . ABDOMINAL HYSTERECTOMY    . APPENDECTOMY    . CARDIAC CATHETERIZATION N/A 07/05/2016   Procedure: Left Heart Cath and Coronary Angiography;  Surgeon: Burnell Blanks, MD;  Location: [facility];  Service: Cardiovascular;  Laterality: N/A;  . CARDIAC SURGERY     cyst, not on heart.  . CORONARY ARTERY BYPASS GRAFT N/A 07/07/2016   Procedure: CORONARY ARTERY BYPASS GRAFTING (CABG) x 5 with endoscopic harvesting of the right greater  saphenous vein;  Surgeon: Gaye Pollack, MD;  Location: [facility] OR;  Service: Open Heart Surgery;  Laterality: N/A;  . HEMORRHOID SURGERY    . INTRAOPERATIVE TRANSESOPHAGEAL ECHOCARDIOGRAM N/A 07/07/2016   Procedure: INTRAOPERATIVE TRANSESOPHAGEAL ECHOCARDIOGRAM;  Surgeon: Gaye Pollack, MD;  [facility];  Service: Open Heart Surgery;  Laterality: N/A;  . SHOULDER SURGERY       Current Outpatient Prescriptions  Medication Sig Dispense Refill  . acyclovir (ZOVIRAX) 400 MG tablet Per patient, when she has an outbreak, takes two orally for one day    . aspirin EC 81 MG tablet Take 81 mg by mouth daily.    [name] levothyroxine (SYNTHROID, LEVOTHROID) 50 MCG tablet Take 50 mcg by mouth daily.    [name] lisinopril (PRINIVIL,ZESTRIL) 10 MG tablet Take 1 tablet (10 mg total) by mouth daily. 30 tablet 1  . Metoprolol Tartrate 37.5 MG TABS Take 37.5 mg by mouth 2 (two) times daily. 60 tablet 1  . pantoprazole (PROTONIX) 40 MG tablet Take 40 mg by mouth daily.    [name] rOPINIRole (REQUIP) 0.5 MG tablet Take 0.5 mg by mouth at bedtime.    . traMADol (ULTRAM) 50 MG tablet Take 1 tablet (50 mg total) by mouth every 6 (six) hours as needed for severe pain. 28 tablet 0  . Evolocumab (REPATHA SURECLICK) [id number] MG/ML SOAJ Inject 1 pen into the skin  every 14 (fourteen) days. (Patient not taking: Reported on 09/08/2016) 2 pen 11   No current facility-administered medications for this visit.     Allergies:   Penicillins; Lincomycin; Naproxen sodium; Statins; Latex; and Tape    Social History:  The patient  reports that she has never smoked. She has never used smokeless tobacco. She reports that she does not drink alcohol or use drugs.   Family History:  The patient's family history includes CAD in her father; Coronary artery disease in her brother and sister; Stroke in her mother.    ROS:  Please see the history of present illness.    Review of Systems: Constitutional:  denies fever, chills, diaphoresis, appetite change  and fatigue.  HEENT: denies photophobia, eye pain, redness, hearing loss, ear pain, congestion, sore throat, rhinorrhea, sneezing, neck pain, neck stiffness and tinnitus.  Respiratory: denies SOB, DOE, cough, chest tightness, and wheezing.  Cardiovascular: denies chest pain, palpitations and leg swelling.  Gastrointestinal: denies nausea, vomiting, abdominal pain, diarrhea, constipation, blood in stool.  Genitourinary: denies dysuria, urgency, frequency, hematuria, flank pain and difficulty urinating.  Musculoskeletal: denies  myalgias, back pain, joint swelling, arthralgias and gait problem.   Skin: denies pallor, rash and wound.  Neurological: denies dizziness, seizures, syncope, weakness, light-headedness, numbness and headaches.   Hematological: denies adenopathy, easy bruising, personal or family bleeding history.  Psychiatric/ Behavioral: denies suicidal ideation, mood changes, confusion, nervousness, sleep disturbance and agitation.       All other systems are reviewed and negative.    PHYSICAL EXAM: VS:  BP (!) 148/84 (BP Location: Left Arm, Patient Position: Sitting, Cuff Size: Normal)   Pulse (!) 58   Ht 4\' 10"  (1.473 m)   Wt 117 lb 12.8 oz (53.4 kg)   BMI 24.62 kg/m  , BMI Body mass index is 24.62 kg/m. GEN: Well nourished, well developed, in no acute distress  HEENT: normal  Neck: no JVD, carotid bruits, or masses Cardiac: RRR; no murmurs, sternotomy has healed well . no rubs, or gallops,no edema  Respiratory:  clear to auscultation bilaterally, normal work of breathing GI: soft, nontender, nondistended, + BS MS: no deformity or atrophy  Skin: warm and dry, no rash Neuro:  Strength and sensation are intact Psych: normal   EKG:  EKG is not ordered today.     Recent Labs: 07/05/2016: TSH 1.259 07/07/2016: ALT 15 07/08/2016: Magnesium 2.3 08/03/2016: BUN 11; Creatinine, Ser 0.63; Hemoglobin 11.3; Platelets 647; Potassium 3.6; Sodium 136    Lipid Panel    Component  Value Date/Time   CHOL 313 (H) 07/05/2016 0353   TRIG 185 (H) 07/05/2016 0353   HDL 59 07/05/2016 0353   CHOLHDL 5.3 07/05/2016 0353   VLDL 37 07/05/2016 0353   LDLCALC 217 (H) 07/05/2016 0353      Wt Readings from Last 3 Encounters:  09/08/16 117 lb 12.8 oz (53.4 kg)  08/24/16 110 lb (49.9 kg)  08/10/16 113 lb (51.3 kg)      Other studies Reviewed: Additional studies/ records that were reviewed today include: . Review of the above records demonstrates:    ASSESSMENT AND PLAN:  1. Coronary artery disease: The patient presents following coronary artery bypass grafting. She seems to be doing very well. She has tried multiple statins and is intolerant to every one of them. She's working with our pharmacist to be approved for [employer].  2. Hyperlipidemia :   On Repatha.  3. HTN:   Stable  HR is a little  slow.   Will decrease Metoprolol to 25 BID .    Current medicines are reviewed at length with the patient today.  The patient does not have concerns regarding medicines.  Labs/ tests ordered today include:  No orders of the defined types were placed in this encounter.    Disposition:   FU with me in 3 months      Mertie Moores, MD  09/08/2016 8:40 AM    [address], [place], [place]  [address] Phone: [phone]; Fax: [phone]   [facility]  [address] [place], [place]  [address] [phone]   Fax [phone]

## 2016-09-15 ENCOUNTER — Telehealth: Payer: Self-pay | Admitting: Cardiovascular Disease

## 2016-09-15 NOTE — Telephone Encounter (Signed)
Repatha was approved by patient's insurance the same day I saw her in clinic 5 weeks ago. I sent in the prescription to [facility] that same day. The patient should have been contacted well over a month ago to set up shipment.   Patient reports she never heard from them. Contacted the specialty pharmacy who stated they spoke with her on Sept 25th. She stated she could not afford the monthly copay because it was $470.  Called patient back and stated they did contact her. Don't know why she called stating that she had not been contacted.  Do not have any way to lower her copay, this is a problem with all of our Medicare patients and [employer]. Patient is interested in other clinical trials. Will forward her information to our research team to see if she qualifies for the CLEAR study.

## 2016-09-15 NOTE — Telephone Encounter (Signed)
New message      Talk to [facility] about starting repatha for this pt.  The PCP's office is wanting to know if we are going to start this medication.

## 2016-10-13 NOTE — Telephone Encounter (Signed)
Left message for patient to call back.  Will continue to call.

## 2016-10-31 ENCOUNTER — Telehealth: Payer: Self-pay | Admitting: *Deleted

## 2016-10-31 NOTE — Telephone Encounter (Signed)
I called patient to let her know that she did not qualify for the Clear Study. Patient had LDL of 213. Patient had to be less than 190 for the study. Patient did not want to repeat the lab work in one week. It would be difficult to lower LDL to 190 in 1 week. I told her we are getting ready to start a new study called Orion. I would consider her for that study. She agreed to be contacted about [employer].

## 2016-12-12 ENCOUNTER — Ambulatory Visit (INDEPENDENT_AMBULATORY_CARE_PROVIDER_SITE_OTHER): Payer: Medicare Other | Admitting: Cardiovascular Disease

## 2016-12-12 ENCOUNTER — Encounter: Payer: Self-pay | Admitting: Cardiovascular Disease

## 2016-12-12 VITALS — BP 120/66 | HR 53 | Ht <= 58 in | Wt 115.0 lb

## 2016-12-12 DIAGNOSIS — I200 Unstable angina: Secondary | ICD-10-CM

## 2016-12-12 DIAGNOSIS — I2510 Atherosclerotic heart disease of native coronary artery without angina pectoris: Secondary | ICD-10-CM | POA: Diagnosis not present

## 2016-12-12 DIAGNOSIS — E7800 Pure hypercholesterolemia, unspecified: Secondary | ICD-10-CM | POA: Diagnosis not present

## 2016-12-12 NOTE — Patient Instructions (Signed)

## 2016-12-12 NOTE — Progress Notes (Signed)
Cardiology Office Note   Date:  12/12/2016   ID:  Katherine Hall, DOB [birth year], [name]  PCP:  [name], MD  Cardiologist:   Mertie Moores, MD , [facility]   Chief Complaint  Patient presents with  . Follow-up    CAD, HTN, hyperlipidemia   Problem list 1. Coronary artery disease-status post coronary artery bypass grafting-  CORONARY ARTERY BYPASS GRAFTING x 5 -LIMA to LAD -SVG to DIAGONAL -SVG to PDA -SEQ SVG OM1 and OM2  2. Hypertension 3. Hyperlipidemia 4. Anemia     Katherine Hall is a 76 y.o. female who presents for follow-up of her coronary artery disease. She had coronary artery bypass grafting in August.  She has had a moderate left pleural effusion Still gradually improving . Needs an order to do cardiac rehab.  Wants to do heart strides at [facility]   Breathing is better.  Still has some chest soreness,  The angina is better   Has very high lipids.   Cannot tolerate statins.  Has seen Fuller Canada to initiate Repatha - working on that  Still sleeping in a recliner  Jan. 8, 2018:  Katherine Hall is doing well. She had coronary artery bypass grafting in August, 2017:   Past Medical History:  Diagnosis Date  . Anemia   . Arthritis   . Gastric ulcer    a. h/o bleeding ulcer 2009 requiring 3 pints of blood.  [name] History of removal of cyst    a. 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  [name] HLD (hyperlipidemia)    a. h/o intolerance of statins, prev on Repatha but insurance stopped covering.  [name] HTN (hypertension)     Past Surgical History:  Procedure Laterality Date  . ABDOMINAL HYSTERECTOMY    . APPENDECTOMY    . CARDIAC CATHETERIZATION N/A 07/05/2016   Procedure: Left Heart Cath and Coronary Angiography;  Surgeon: Burnell Blanks, MD;  Location: [place] CV [facility];  Service: Cardiovascular;  Laterality: N/A;  . CARDIAC SURGERY     cyst, not on heart.  . CORONARY ARTERY BYPASS GRAFT N/A 07/07/2016   Procedure:  CORONARY ARTERY BYPASS GRAFTING (CABG) x 5 with endoscopic harvesting of the right greater saphenous vein;  Surgeon: Gaye Pollack, MD;  Location: [place] OR;  Service: Open Heart Surgery;  Laterality: N/A;  . HEMORRHOID SURGERY    . INTRAOPERATIVE TRANSESOPHAGEAL ECHOCARDIOGRAM N/A 07/07/2016   Procedure: INTRAOPERATIVE TRANSESOPHAGEAL ECHOCARDIOGRAM;  Surgeon: Gaye Pollack, MD;  Location: [facility] OR;  Service: Open Heart Surgery;  Laterality: N/A;  . SHOULDER SURGERY       Current Outpatient Prescriptions  Medication Sig Dispense Refill  . acyclovir (ZOVIRAX) 400 MG tablet Per patient, when she has an outbreak, takes two orally for one day    . aspirin EC 81 MG tablet Take 81 mg by mouth daily.    [name] escitalopram (LEXAPRO) 10 MG tablet Take 5 mg by mouth daily.    . ferrous sulfate 324 (65 Fe) MG TBEC Take 65 mg by mouth daily.    [name] levothyroxine (SYNTHROID, LEVOTHROID) 50 MCG tablet Take 50 mcg by mouth daily.    [name] lisinopril (PRINIVIL,ZESTRIL) 10 MG tablet Take 1 tablet (10 mg total) by mouth daily. 30 tablet 1  . metoprolol tartrate (LOPRESSOR) 25 MG tablet Take 1 tablet (25 mg total) by mouth 2 (two) times daily. 60 tablet 11  . pantoprazole (PROTONIX) 40 MG tablet Take 40 mg by mouth daily.    [name]  rOPINIRole (REQUIP) 0.5 MG tablet Take 0.5 mg by mouth at bedtime.    . traMADol (ULTRAM) 50 MG tablet Take 1 tablet (50 mg total) by mouth every 6 (six) hours as needed for severe pain. 28 tablet 0   No current facility-administered medications for this visit.     Allergies:   Penicillins; Lincomycin; Naproxen sodium; Statins; Latex; and Tape    Social History:  The patient  reports that she has never smoked. She has never used smokeless tobacco. She reports that she does not drink alcohol or use drugs.   Family History:  The patient's family history includes CAD in her father; Coronary artery disease in her brother and sister; Stroke in her mother.    ROS:  Please see the history of present  illness.    Review of Systems: Constitutional:  denies fever, chills, diaphoresis, appetite change and fatigue.  HEENT: denies photophobia, eye pain, redness, hearing loss, ear pain, congestion, sore throat, rhinorrhea, sneezing, neck pain, neck stiffness and tinnitus.  Respiratory: denies SOB, DOE, cough, chest tightness, and wheezing.  Cardiovascular: denies chest pain, palpitations and leg swelling.  Gastrointestinal: denies nausea, vomiting, abdominal pain, diarrhea, constipation, blood in stool.  Genitourinary: denies dysuria, urgency, frequency, hematuria, flank pain and difficulty urinating.  Musculoskeletal: denies  myalgias, back pain, joint swelling, arthralgias and gait problem.   Skin: denies pallor, rash and wound.  Neurological: denies dizziness, seizures, syncope, weakness, light-headedness, numbness and headaches.   Hematological: denies adenopathy, easy bruising, personal or family bleeding history.  Psychiatric/ Behavioral: denies suicidal ideation, mood changes, confusion, nervousness, sleep disturbance and agitation.       All other systems are reviewed and negative.    PHYSICAL EXAM: VS:  BP 120/66 (BP Location: Right Arm, Patient Position: Sitting, Cuff Size: Normal)   Pulse (!) 53   Ht 4\' 10"  (1.473 m)   Wt 115 lb (52.2 kg)   SpO2 98%   BMI 24.04 kg/m  , BMI Body mass index is 24.04 kg/m. GEN: Well nourished, well developed, in no acute distress  HEENT: normal  Neck: no JVD, carotid bruits, or masses Cardiac: RRR; no murmurs, sternotomy has healed well . no rubs, or gallops,no edema  Respiratory:  clear to auscultation bilaterally, normal work of breathing GI: soft, nontender, nondistended, + BS MS: no deformity or atrophy  Skin: warm and dry, no rash Neuro:  Strength and sensation are intact Psych: normal   EKG:  EKG is not ordered today.     Recent Labs: 07/05/2016: TSH 1.259 07/07/2016: ALT 15 07/08/2016: Magnesium 2.3 08/03/2016: BUN 11;  Creatinine, Ser 0.63; Hemoglobin 11.3; Platelets 647; Potassium 3.6; Sodium 136    Lipid Panel    Component Value Date/Time   CHOL 313 (H) 07/05/2016 0353   TRIG 185 (H) 07/05/2016 0353   HDL 59 07/05/2016 0353   CHOLHDL 5.3 07/05/2016 0353   VLDL 37 07/05/2016 0353   LDLCALC 217 (H) 07/05/2016 0353      Wt Readings from Last 3 Encounters:  12/12/16 115 lb (52.2 kg)  09/08/16 117 lb 12.8 oz (53.4 kg)  08/24/16 110 lb (49.9 kg)      Other studies Reviewed: Additional studies/ records that were reviewed today include: . Review of the above records demonstrates:    ASSESSMENT AND PLAN:  1. Coronary artery disease: The patient presents following coronary artery bypass grafting. She seems to be doing very well. She has tried multiple statins and is intolerant to every one of them.  Was  not able to get enrolled in one of the Repatha studies.   2. Pure  Hyperlipidemia :  Will hopefully be able to get on a PSK-9 inhibitor at some point   3. HTN:   Stable  HR is a little slow.   Will decrease Metoprolol to 25 BID .    Current medicines are reviewed at length with the patient today.  The patient does not have concerns regarding medicines.  Labs/ tests ordered today include:  No orders of the defined types were placed in this encounter.    Disposition:   FU with me in 6 months      Mertie Moores, MD  12/12/2016 11:57 AM    [address], [place], [place]  [address] Phone: [phone]; Fax: [phone]

## 2016-12-30 IMAGING — CR DG CHEST 2V
2 series · 2 of 2 positions shown · non-contrast
Comparison: 08/03/2016

CLINICAL DATA: Previous CABG July 2016. Shortness of breath and
chest pain presently.

EXAM:
CHEST  2 VIEW

[w chest pa]
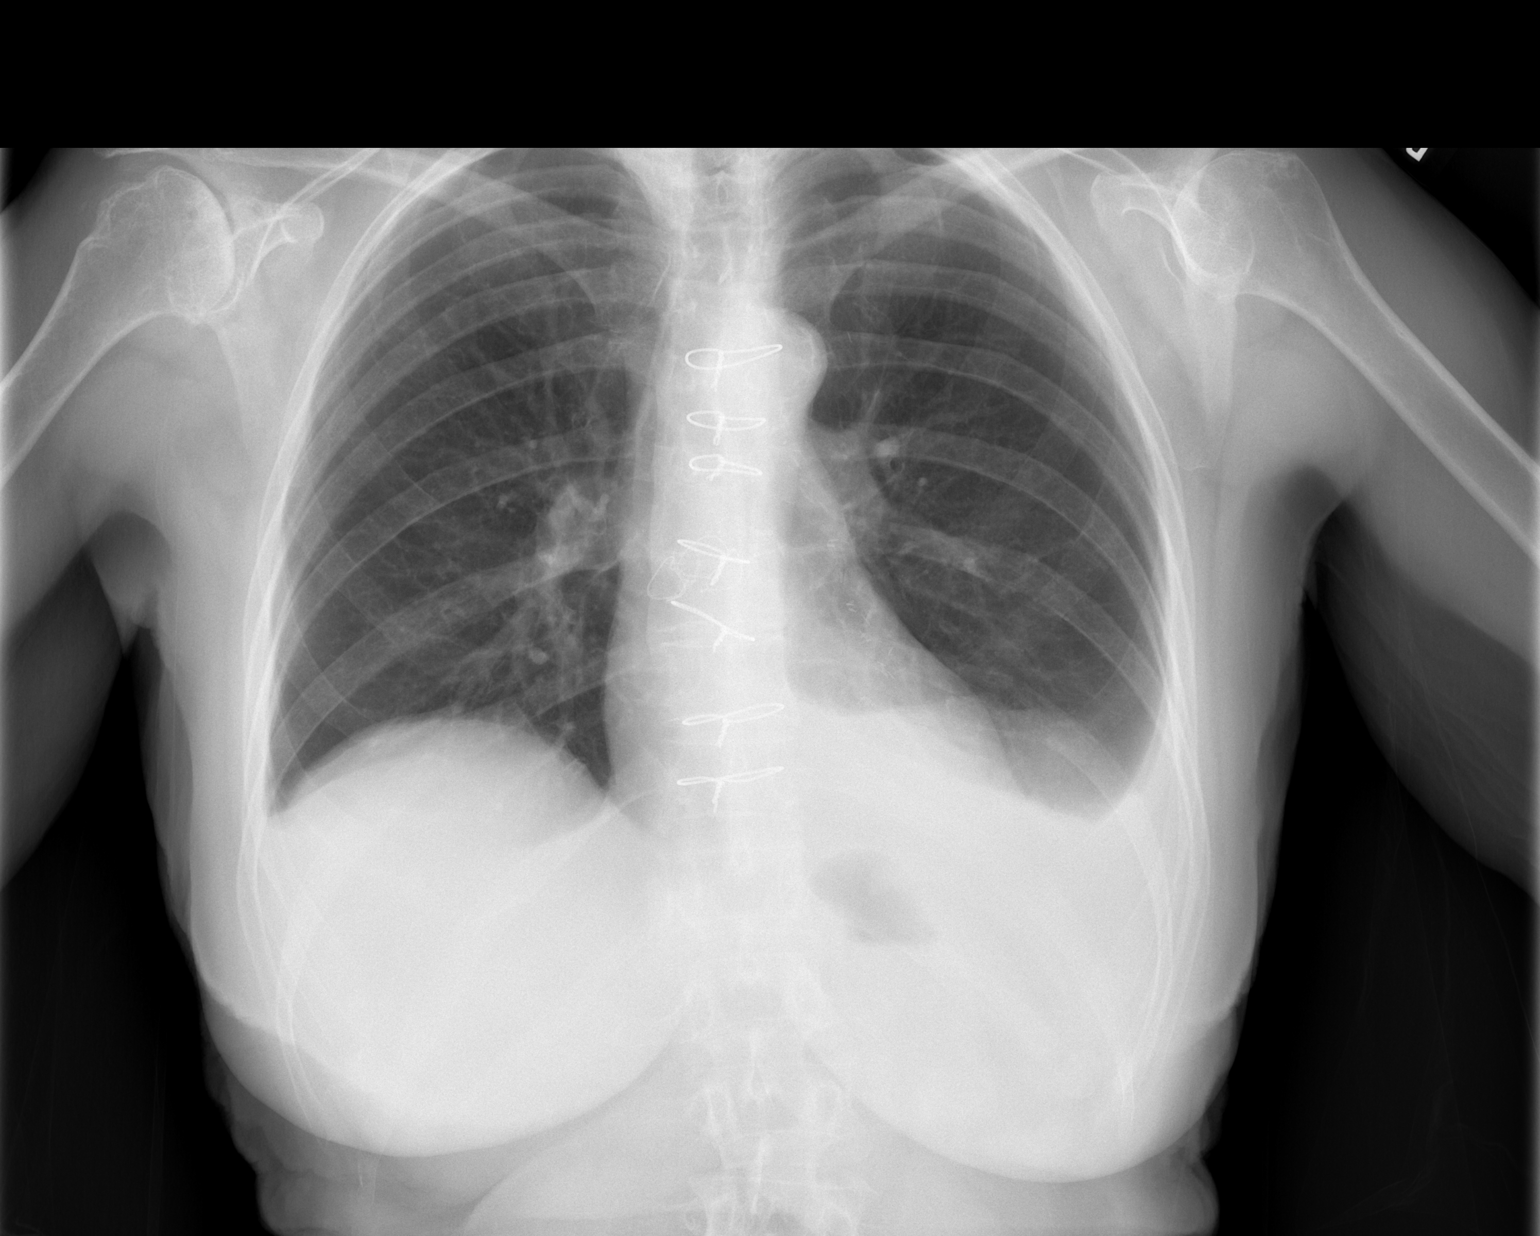

[w chest lat]
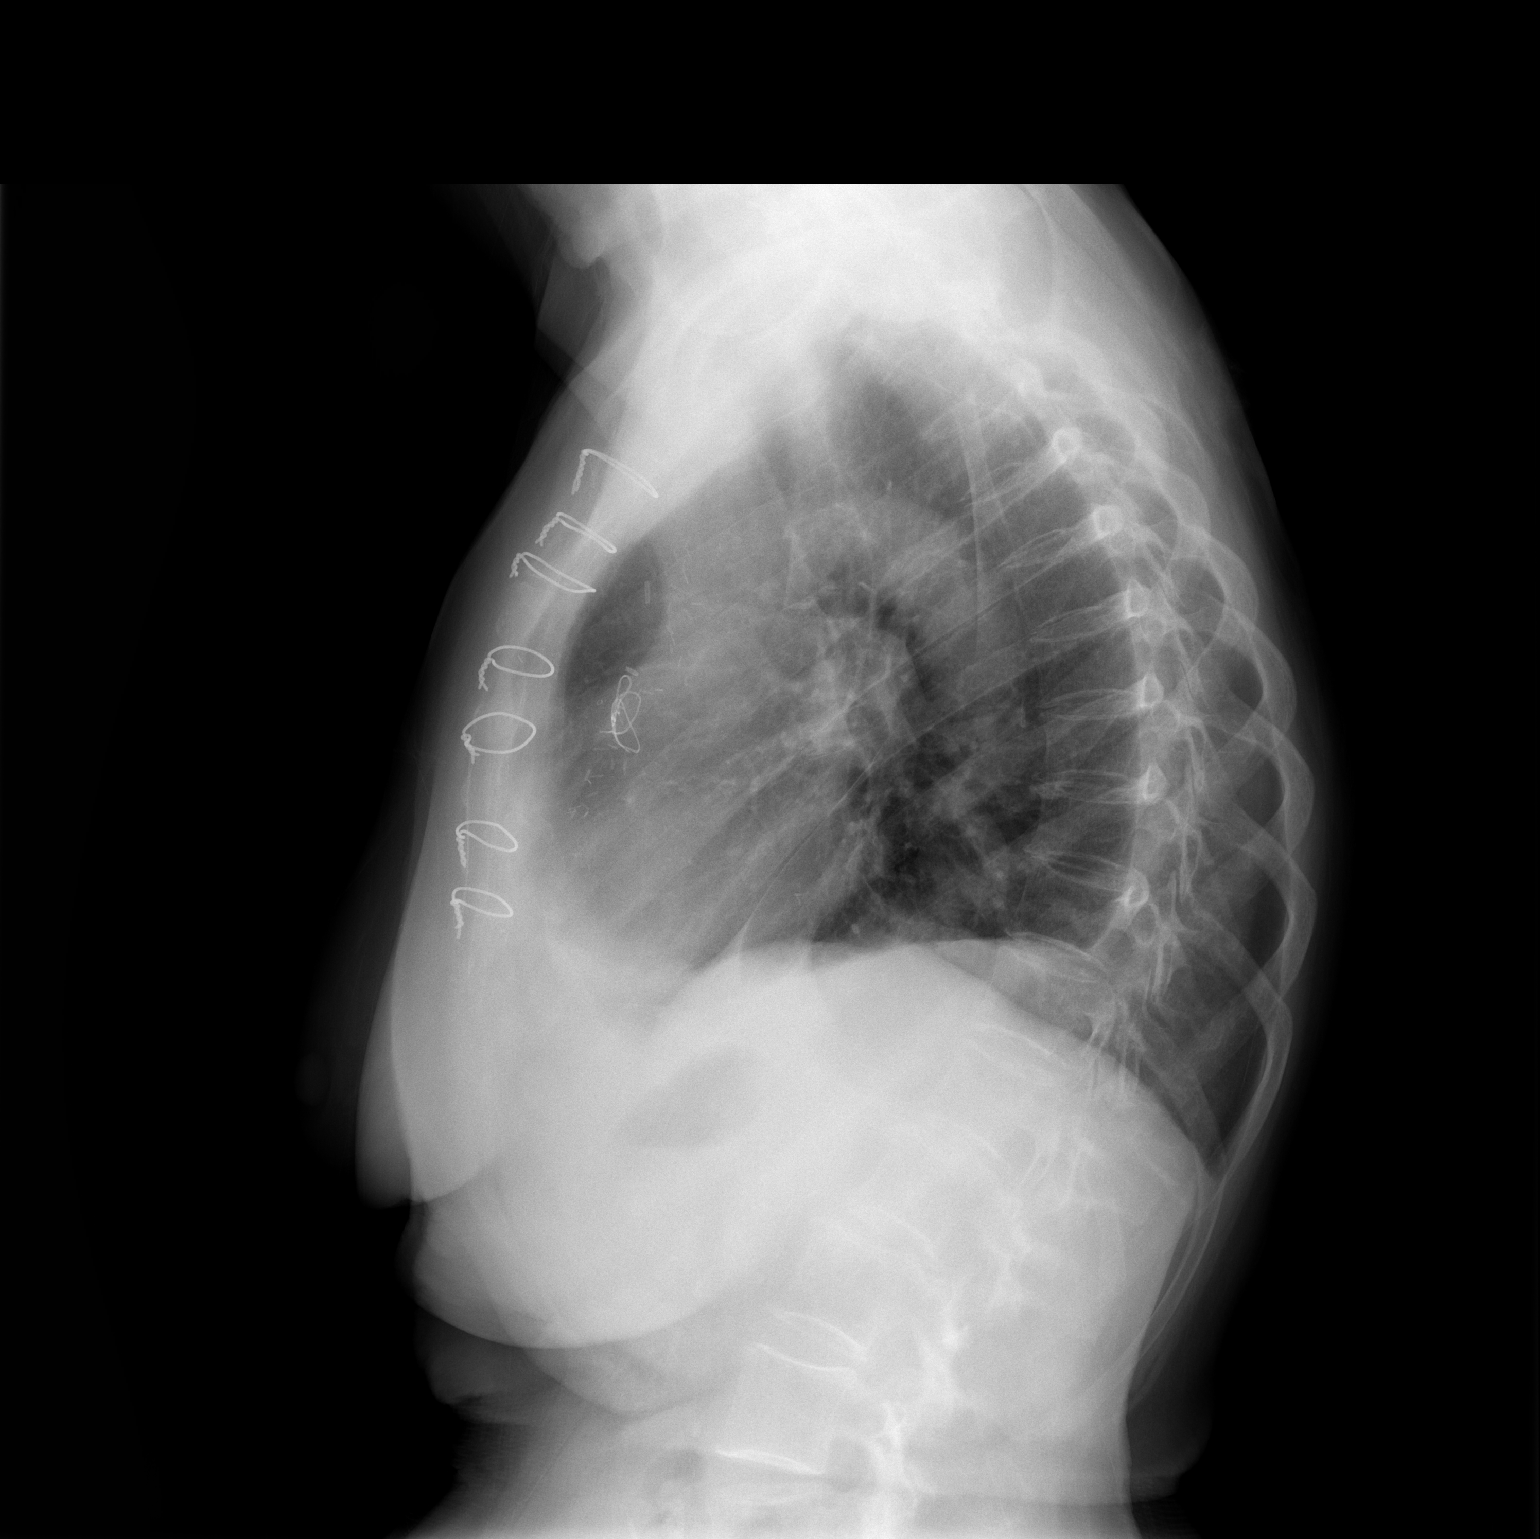

[2 of 2 positions shown; findings below may reference images not displayed]

FINDINGS: Previous median sternotomy and CABG. Heart size is normal. There are
small residual effusions, left larger than right, with mild basilar
volume loss. This is improved since the previous study. Upper lungs
are clear. No acute bone finding. Chronic postoperative changes of
the right shoulder.
IMPRESSION: Status post recent CABG. Diminishing effusions, larger on the left
than the right with associated basilar atelectasis. No worsening or
new finding.

## 2017-01-24 ENCOUNTER — Encounter: Payer: Self-pay | Admitting: *Deleted

## 2017-01-24 DIAGNOSIS — Z006 Encounter for examination for normal comparison and control in clinical research program: Secondary | ICD-10-CM

## 2017-01-24 NOTE — Progress Notes (Signed)
Subject met inclusion and exclusion criteria. The informed consent form, study requirements and expectations were reviewed with the subject and questions and concerns were addressed prior to the signing of the consent form. The subject verbalized understanding of the trail requirements. The subject agreed to participate in theCLEARtrial and signed the informed consent. The informed consent was obtained prior to performance of any protocol-specific procedures for the subject. A copy of the signed informed consent was given to the subject and a copy was placed in the subject's medical record.   Jake Bathe, RN January 24, 2017

## 2017-01-26 ENCOUNTER — Telehealth: Payer: Self-pay | Admitting: Cardiovascular Disease

## 2017-01-26 NOTE — Telephone Encounter (Signed)
Spoke with Calli at Dr. Alcario Drought office and advised her that patient does not need to hold aspirin 81 mg for dental cleaning. She thanked me for the call.

## 2017-01-26 NOTE — Telephone Encounter (Signed)
Calli with Dr.George Wallace's office needs to know if pt needs to come off blood thinner prior to any dental work-pls fax [phone]

## 2017-01-30 ENCOUNTER — Telehealth: Payer: Self-pay | Admitting: Cardiovascular Disease

## 2017-01-30 ENCOUNTER — Encounter (HOSPITAL_COMMUNITY): Payer: Self-pay | Admitting: Nurse Practitioner

## 2017-01-30 NOTE — Telephone Encounter (Signed)
New Message     Request for surgical clearance:  1. What type of surgery is being performed? Teeth cleaned   2. When is this surgery scheduled? 01/30/17  3. Are there any medications that need to be held prior to surgery and how long? None   Name of physician performing surgery? Dr wallace  4. What is your office phone and fax number?  Fax [phone] 5.  6.  7. Please fax note saying the it is okay for pt to have dental work done, it will not effect her heart condition

## 2017-01-30 NOTE — Telephone Encounter (Signed)
Clearance faxed and confirmation received.  

## 2017-01-30 NOTE — Telephone Encounter (Signed)
Katherine Hall is at low risk for dental cleaning.  If aspirin needs to be held, she should resume therapy the following day

## 2017-02-08 ENCOUNTER — Other Ambulatory Visit: Payer: Self-pay | Admitting: *Deleted

## 2017-02-08 DIAGNOSIS — Z006 Encounter for examination for normal comparison and control in clinical research program: Secondary | ICD-10-CM

## 2017-02-08 MED ORDER — AMBULATORY NON FORMULARY MEDICATION: 180.0000 mg | Freq: Every day | Status: DC

## 2017-04-24 ENCOUNTER — Telehealth: Payer: Self-pay | Admitting: Cardiovascular Disease

## 2017-04-24 NOTE — Telephone Encounter (Signed)
New Message     When is it safe for her to schedule her knee surgery

## 2017-04-24 NOTE — Telephone Encounter (Signed)
Spoke with patient advised her to contact her surgeon to schedule surgery and have their office send clearance to [place]. I advised her that holding aspirin will be addressed in the clearance from the surgeon and she will be advised accordingly. She thanked me for the call.

## 2017-04-24 NOTE — Telephone Encounter (Signed)
Left message for patient to call back. She had CABG 8/17 and takes aspirin 81 mg daily.  Per Dr. Acie Fredrickson, patient can schedule surgery at anytime.

## 2017-06-01 ENCOUNTER — Encounter: Payer: Self-pay | Admitting: *Deleted

## 2017-06-01 DIAGNOSIS — Z006 Encounter for examination for normal comparison and control in clinical research program: Secondary | ICD-10-CM

## 2017-06-01 NOTE — Progress Notes (Addendum)
Subject to Research clinic for T3-M3 visit for the Clear Research Study.  No c/o, aes or saes to report.  97% compliant with medication and new bottle dispensed.  Subject reconsented to [place] version 4.1 [id number] Local version [phone]

## 2017-07-10 ENCOUNTER — Encounter: Payer: Self-pay | Admitting: Cardiovascular Disease

## 2017-07-26 ENCOUNTER — Other Ambulatory Visit: Payer: Medicare Other | Admitting: *Deleted

## 2017-07-26 ENCOUNTER — Ambulatory Visit (INDEPENDENT_AMBULATORY_CARE_PROVIDER_SITE_OTHER): Payer: Medicare Other | Admitting: Cardiovascular Disease

## 2017-07-26 ENCOUNTER — Encounter: Payer: Self-pay | Admitting: Cardiovascular Disease

## 2017-07-26 ENCOUNTER — Ambulatory Visit: Payer: Medicare Other | Admitting: Cardiovascular Disease

## 2017-07-26 VITALS — BP 140/80 | HR 56 | Ht <= 58 in | Wt 120.1 lb

## 2017-07-26 DIAGNOSIS — I2510 Atherosclerotic heart disease of native coronary artery without angina pectoris: Secondary | ICD-10-CM | POA: Diagnosis not present

## 2017-07-26 DIAGNOSIS — E7800 Pure hypercholesterolemia, unspecified: Secondary | ICD-10-CM

## 2017-07-26 DIAGNOSIS — R5383 Other fatigue: Secondary | ICD-10-CM | POA: Diagnosis not present

## 2017-07-26 DIAGNOSIS — E782 Mixed hyperlipidemia: Secondary | ICD-10-CM

## 2017-07-26 NOTE — Addendum Note (Signed)
Addended by: Eulis Foster on: 07/26/2017 01:45 PM   Modules accepted: Orders

## 2017-07-26 NOTE — Patient Instructions (Signed)
Medication Instructions:  Your physician recommends that you continue on your current medications as directed. Please refer to the Current Medication list given to you today.   Labwork: TODAY - cholesterol, TSH, CBC, liver panel, basic metabolic panel   Testing/Procedures: None Ordered   Follow-Up: Your physician wants you to follow-up in: 6 months with Dr. Acie Fredrickson.  You will receive a reminder letter in the mail two months in advance. If you don't receive a letter, please call our office to schedule the follow-up appointment.   If you need a refill on your cardiac medications before your next appointment, please call your pharmacy.   Thank you for choosing [facility]! Christen Bame, RN [phone]

## 2017-07-26 NOTE — Progress Notes (Signed)
Cardiology Office Note   Date:  07/26/2017   ID:  Katherine, Hall 1941-10-23, MRN [id number]  PCP:  Katherine Cliche, MD  Cardiologist:   Katherine Moores, MD , [facility]   Chief Complaint  Patient presents with  . Follow-up    CAD   Problem list 1. Coronary artery disease-status post coronary artery bypass grafting-  CORONARY ARTERY BYPASS GRAFTING x 5 -LIMA to LAD -SVG to DIAGONAL -SVG to PDA -SEQ SVG OM1 and OM2  2. Hypertension 3. Hyperlipidemia 4. Anemia     Katherine Hall is a 76 y.o. female who presents for follow-up of her coronary artery disease. She had coronary artery bypass grafting in August.  She has had a moderate left pleural effusion Still gradually improving . Needs an order to do cardiac rehab.  Wants to do heart strides at [facility]   Breathing is better.  Still has some chest soreness,  The angina is better   Has very high lipids.   Cannot tolerate statins.  Has seen Fuller Canada to initiate Repatha - working on that  Still sleeping in a recliner  Jan. 8, 2018:  Katherine is doing well. She had coronary artery bypass grafting in August, 2017: Is not doing cardiac rehab.  Has lots of leg pain  Needs to have surgery on her right knee Has SVG harvest site pain .  Does not take her BP regularly  Aug. 22, 2018:  Ms Hall is still having some chest pains. Described as a discomfort.  Last 5 minutes. She does not know if it worsens with twisting or taking a deep breath  Is not exercising ,.      Past Medical History:  Diagnosis Date  . Anemia   . Arthritis   . Gastric ulcer    a. h/o bleeding ulcer 2009 requiring 3 pints of blood.  Katherine Hall History of removal of cyst    a. 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  Katherine Hall HLD (hyperlipidemia)    a. h/o intolerance of statins, prev on Repatha but insurance stopped covering.  Katherine Hall HTN (hypertension)     Past Surgical History:  Procedure Laterality Date  . ABDOMINAL  HYSTERECTOMY    . APPENDECTOMY    . CARDIAC CATHETERIZATION N/A 07/05/2016   Procedure: Left Heart Cath and Coronary Angiography;  Surgeon: Burnell Blanks, MD;  Location: [place] CV [facility];  Service: Cardiovascular;  Laterality: N/A;  . CARDIAC SURGERY     cyst, not on heart.  . CORONARY ARTERY BYPASS GRAFT N/A 07/07/2016   Procedure: CORONARY ARTERY BYPASS GRAFTING (CABG) x 5 with endoscopic harvesting of the right greater saphenous vein;  Surgeon: Gaye Pollack, MD;  Location: [place] OR;  Service: Open Heart Surgery;  Laterality: N/A;  . HEMORRHOID SURGERY    . INTRAOPERATIVE TRANSESOPHAGEAL ECHOCARDIOGRAM N/A 07/07/2016   Procedure: INTRAOPERATIVE TRANSESOPHAGEAL ECHOCARDIOGRAM;  Surgeon: Gaye Pollack, MD;  Location: [facility] OR;  Service: Open Heart Surgery;  Laterality: N/A;  . SHOULDER SURGERY       Current Outpatient Prescriptions  Medication Sig Dispense Refill  . acyclovir (ZOVIRAX) 400 MG tablet Per patient, when she has an outbreak, takes two orally for one day    . acyclovir (ZOVIRAX) 400 MG tablet Take 800 mg by mouth as directed.    Katherine Hall aspirin EC 81 MG tablet Take 81 mg by mouth daily.    Katherine Hall escitalopram (LEXAPRO) 10 MG tablet Take 5 mg by mouth daily.    Katherine Hall  levothyroxine (SYNTHROID, LEVOTHROID) 50 MCG tablet Take 50 mcg by mouth daily.    Katherine Hall lisinopril (PRINIVIL,ZESTRIL) 10 MG tablet Take 1 tablet (10 mg total) by mouth daily. 30 tablet 1  . metoprolol tartrate (LOPRESSOR) 25 MG tablet Take 25 mg by mouth 2 (two) times daily.    . pantoprazole (PROTONIX) 40 MG tablet Take 40 mg by mouth daily.    Katherine Hall rOPINIRole (REQUIP) 0.5 MG tablet Take 0.5 mg by mouth at bedtime.    . traMADol (ULTRAM) 50 MG tablet Take 1 tablet (50 mg total) by mouth every 6 (six) hours as needed for severe pain. 28 tablet 0  . ferrous sulfate 324 (65 Fe) MG TBEC Take 65 mg by mouth daily.     No current facility-administered medications for this visit.     Allergies:   Penicillins; Lincomycin; Naproxen  sodium; Statins; Latex; and Tape    Social History:  The patient  reports that she has never smoked. She has never used smokeless tobacco. She reports that she does not drink alcohol or use drugs.   Family History:  The patient's family history includes CAD in her father; Coronary artery disease in her brother and sister; Stroke in her mother.    ROS:  Please see the history of present illness.    Review of Systems: Constitutional:  denies fever, chills, diaphoresis, appetite change and fatigue.  HEENT: denies photophobia, eye pain, redness, hearing loss, ear pain, congestion, sore throat, rhinorrhea, sneezing, neck pain, neck stiffness and tinnitus.  Respiratory: denies SOB, DOE, cough, chest tightness, and wheezing.  Cardiovascular: denies chest pain, palpitations and leg swelling.  Gastrointestinal: denies nausea, vomiting, abdominal pain, diarrhea, constipation, blood in stool.  Genitourinary: denies dysuria, urgency, frequency, hematuria, flank pain and difficulty urinating.  Musculoskeletal: denies  myalgias, back pain, joint swelling, arthralgias and gait problem.   Skin: denies pallor, rash and wound.  Neurological: denies dizziness, seizures, syncope, weakness, light-headedness, numbness and headaches.   Hematological: denies adenopathy, easy bruising, personal or family bleeding history.  Psychiatric/ Behavioral: denies suicidal ideation, mood changes, confusion, nervousness, sleep disturbance and agitation.       All other systems are reviewed and negative.    PHYSICAL EXAM: VS:  BP 140/80   Pulse (!) 56   Ht 4\' 10"  (1.473 m)   Wt 120 lb 1.9 oz (54.5 kg)   BMI 25.11 kg/m  , BMI Body mass index is 25.11 kg/m. GEN: Well nourished, well developed, in no acute distress  HEENT: normal  Neck: no JVD, carotid bruits, or masses Cardiac: RRR; no murmurs, sternotomy has healed well . no rubs, or gallops,no edema  Respiratory:  clear to auscultation bilaterally, normal work  of breathing GI: soft, nontender, nondistended, + BS MS: no deformity or atrophy  Skin: warm and dry, no rash Neuro:  Strength and sensation are intact Psych: normal   EKG:  EKG is ordered today.  Aug. 22, 2018:   Sinus brady at 56.  No ST or T wave changes.     Recent Labs: 08/03/2016: BUN 11; Creatinine, Ser 0.63; Hemoglobin 11.3; Platelets 647; Potassium 3.6; Sodium 136    Lipid Panel    Component Value Date/Time   CHOL 313 (H) 07/05/2016 0353   TRIG 185 (H) 07/05/2016 0353   HDL 59 07/05/2016 0353   CHOLHDL 5.3 07/05/2016 0353   VLDL 37 07/05/2016 0353   LDLCALC 217 (H) 07/05/2016 0353      Wt Readings from Last 3 Encounters:  07/26/17 120  lb 1.9 oz (54.5 kg)  06/01/17 119 lb (54 kg)  12/12/16 115 lb (52.2 kg)      Other studies Reviewed: Additional studies/ records that were reviewed today include: . Review of the above records demonstrates:    ASSESSMENT AND PLAN:  1. Coronary artery disease: The patient presents following coronary artery bypass grafting Years ago. She still having beers aches and pains. She is not exercising. Told that she may need to see her general medical doctor to address these various aches and pains. She is also having lots of fatigue.  We'll draw labs today including complete metabolic profile, CBC, TSH, lipid profile.  2. Pure  Hyperlipidemia :  Will hopefully be able to get on a PSK-9 inhibitor at some point   3. HTN:   Stable  HR is a little slow.     4. Fatigue: She has been told to take iron by her primary medical doctor. We'll check a CBC today. I encouraged her to go ahead and take the iron tablet.   Current medicines are reviewed at length with the patient today.  The patient does not have concerns regarding medicines.  Labs/ tests ordered today include:  No orders of the defined types were placed in this encounter.    Disposition:   FU with me in 6 months      Katherine Moores, MD  07/26/2017 9:47 AM    [facility] [place], [place], [place]  [address] Phone: [phone]; Fax: [phone]

## 2017-07-27 LAB — CBC
Hematocrit: 37.3 % (ref 34.0–46.6)
Hemoglobin: 12 g/dL (ref 11.1–15.9)
MCH: 31.1 pg (ref 26.6–33.0)
MCHC: 32.2 g/dL (ref 31.5–35.7)
MCV: 97 fL (ref 79–97)
Platelets: 368 10*3/uL (ref 150–379)
RBC: 3.86 x10E6/uL (ref 3.77–5.28)
RDW: 13.8 % (ref 12.3–15.4)
WBC: 6.8 10*3/uL (ref 3.4–10.8)

## 2017-07-27 LAB — LIPID PANEL
Chol/HDL Ratio: 5.2 ratio — ABNORMAL HIGH (ref 0.0–4.4)
Cholesterol, Total: 319 mg/dL — ABNORMAL HIGH (ref 100–199)
HDL: 61 mg/dL (ref >39)
LDL Calculated: 224 mg/dL — ABNORMAL HIGH (ref 0–99)
Triglycerides: 169 mg/dL — ABNORMAL HIGH (ref 0–149)
VLDL Cholesterol Cal: 34 mg/dL (ref 5–40)

## 2017-07-27 LAB — BASIC METABOLIC PANEL
BUN/Creatinine Ratio: 21 (ref 12–28)
BUN: 14 mg/dL (ref 8–27)
CO2: 24 mmol/L (ref 20–29)
Calcium: 9.3 mg/dL (ref 8.7–10.3)
Chloride: 100 mmol/L (ref 96–106)
Creatinine, Ser: 0.66 mg/dL (ref 0.57–1.00)
GFR calc Af Amer: 99 mL/min/{1.73_m2} (ref >59)
GFR calc non Af Amer: 86 mL/min/{1.73_m2} (ref >59)
Glucose: 85 mg/dL (ref 65–99)
Potassium: 4.7 mmol/L (ref 3.5–5.2)
Sodium: 140 mmol/L (ref 134–144)

## 2017-07-27 LAB — HEPATIC FUNCTION PANEL
ALT: 9 IU/L (ref 0–32)
AST: 16 IU/L (ref 0–40)
Albumin: 4.3 g/dL (ref 3.5–4.8)
Alkaline Phosphatase: 47 IU/L (ref 39–117)
Bilirubin Total: 0.2 mg/dL (ref 0.0–1.2)
Bilirubin, Direct: 0.06 mg/dL (ref 0.00–0.40)
Total Protein: 6 g/dL (ref 6.0–8.5)

## 2017-07-27 LAB — TSH: TSH: 1.2 u[IU]/mL (ref 0.450–4.500)

## 2017-09-05 ENCOUNTER — Encounter: Payer: Self-pay | Admitting: *Deleted

## 2017-09-05 DIAGNOSIS — Z006 Encounter for examination for normal comparison and control in clinical research program: Secondary | ICD-10-CM

## 2017-09-05 NOTE — Progress Notes (Signed)
Katherine Hall was into the Research Clinic for visit T4-M6 in the CLEAR research trial.  She had a complaint of worsening of Right Knee pain.  She spoke with her PCP  who questioned her if she thought the pain could be related to the study drug.  She stopped taking the pill, however the pain is still present and she is now talking with another physician about Total right knee replacement sometime in January.  She does not want to continue taking study med right now, but wants to wait until after her surgery to re-challenge the drug. Her next visit will be by telephone in January and we will continue assessing her knee pain and establish a re-start of study drug. At this point she is in agreement to continue study visits as scheduled.

## 2017-09-21 DIAGNOSIS — I739 Peripheral vascular disease, unspecified: Secondary | ICD-10-CM | POA: Insufficient documentation

## 2017-12-15 ENCOUNTER — Telehealth: Payer: Self-pay | Admitting: *Deleted

## 2017-12-15 NOTE — Telephone Encounter (Signed)
Spoke with subject for visit T5-M9 in the Clear Research study.  Next clinic visit confirmed.

## 2018-03-07 ENCOUNTER — Encounter: Payer: Self-pay | Admitting: *Deleted

## 2018-03-07 DIAGNOSIS — Z006 Encounter for examination for normal comparison and control in clinical research program: Secondary | ICD-10-CM

## 2018-03-07 NOTE — Progress Notes (Signed)
Late entry: Subject to research clinic for [phone] in the Clear Research study.  No c/o today or aes to report.  SAE for hospitalization for Right knee replacement reported to sponsor through [place].  Subject is re-challenging study drug now that her surgical procedure has been done. Medication dispensed and next phone call and clinic visit has been scheduled.

## 2018-05-01 ENCOUNTER — Encounter: Payer: Self-pay | Admitting: Cardiovascular Disease

## 2018-05-01 ENCOUNTER — Encounter (INDEPENDENT_AMBULATORY_CARE_PROVIDER_SITE_OTHER): Payer: Self-pay

## 2018-05-01 ENCOUNTER — Ambulatory Visit: Payer: Medicare Other | Admitting: Cardiovascular Disease

## 2018-05-01 VITALS — BP 124/62 | HR 53 | Ht <= 58 in | Wt 128.0 lb

## 2018-05-01 DIAGNOSIS — E782 Mixed hyperlipidemia: Secondary | ICD-10-CM | POA: Diagnosis not present

## 2018-05-01 DIAGNOSIS — R001 Bradycardia, unspecified: Secondary | ICD-10-CM

## 2018-05-01 DIAGNOSIS — I2510 Atherosclerotic heart disease of native coronary artery without angina pectoris: Secondary | ICD-10-CM

## 2018-05-01 MED ORDER — METOPROLOL TARTRATE 25 MG PO TABS: 12.5000 mg | ORAL_TABLET | Freq: Two times a day (BID) | ORAL | 3 refills | Status: DC

## 2018-05-01 NOTE — Addendum Note (Signed)
Addended by: Thayer Headings on: 05/01/2018 12:24 PM   Modules accepted: Level of Service

## 2018-05-01 NOTE — Progress Notes (Addendum)
Cardiology Office Note   Date:  05/01/2018   ID:  Katherine Hall, Katherine Hall 08-Mar-1941, MRN [id number]  PCP:  [name], MD  Cardiologist:   Mertie Moores, MD , [facility]   Chief Complaint  Patient presents with  . Coronary Artery Disease   Problem list 1. Coronary artery disease-status post coronary artery bypass grafting-  CORONARY ARTERY BYPASS GRAFTING x 5 -LIMA to LAD -SVG to DIAGONAL -SVG to PDA -SEQ SVG OM1 and OM2  2. Hypertension 3. Hyperlipidemia 4. Anemia     Gary Bultman is a 77 y.o. female who presents for follow-up of her coronary artery disease. She had coronary artery bypass grafting in August.  She has had a moderate left pleural effusion Still gradually improving . Needs an order to do cardiac rehab.  Wants to do heart strides at [facility]   Breathing is better.  Still has some chest soreness,  The angina is better   Has very high lipids.   Cannot tolerate statins.  Has seen [facility] [place] to initiate Repatha - working on that  Still sleeping in a recliner  Jan. 8, 2018:  Everly is doing well. She had coronary artery bypass grafting in August, 2017: Is not doing cardiac rehab.  Has lots of leg pain  Needs to have surgery on her right knee Has SVG harvest site pain .  Does not take her BP regularly  Aug. 22, 2018:  Ms Seybold is still having some chest pains. Described as a discomfort.  Last 5 minutes. She does not know if it worsens with twisting or taking a deep breath  Is not exercising ,.   May 01, 2018: Seen for follow-up of her coronary artery disease.  She is status post coronary artery bypass grafting in August, 2017.  Also has a history of hypertension and hyperlipidemia.  Some various aches and pains .  Has some left neck pain .   Lasts for a few seconds.  Has had right  knee surgery since I last saw her.   No CP or dyspnea , Gets fatigued easily . Does not sleep well     Past Medical History:  Diagnosis Date  . Anemia   .  Arthritis   . Gastric ulcer    a. h/o bleeding ulcer 2009 requiring 3 pints of blood.  [name] History of removal of cyst    a. 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  [name] HLD (hyperlipidemia)    a. h/o intolerance of statins, prev on Repatha but insurance stopped covering.  [name] HTN (hypertension)     Past Surgical History:  Procedure Laterality Date  . ABDOMINAL HYSTERECTOMY    . APPENDECTOMY    . CARDIAC CATHETERIZATION N/A 07/05/2016   Procedure: Left Heart Cath and Coronary Angiography;  Surgeon: Burnell Blanks, MD;  Location: [facility];  Service: Cardiovascular;  Laterality: N/A;  . CARDIAC SURGERY     cyst, not on heart.  . CORONARY ARTERY BYPASS GRAFT N/A 07/07/2016   Procedure: CORONARY ARTERY BYPASS GRAFTING (CABG) x 5 with endoscopic harvesting of the right greater saphenous vein;  Surgeon: Gaye Pollack, MD;  Location: [facility];  Service: Open Heart Surgery;  Laterality: N/A;  . HEMORRHOID SURGERY    . INTRAOPERATIVE TRANSESOPHAGEAL ECHOCARDIOGRAM N/A 07/07/2016   Procedure: INTRAOPERATIVE TRANSESOPHAGEAL ECHOCARDIOGRAM;  Surgeon: Gaye Pollack, MD;  Location: [facility] OR;  Service: Open Heart Surgery;  Laterality: N/A;  . SHOULDER SURGERY  Current Outpatient Medications  Medication Sig Dispense Refill  . acyclovir (ZOVIRAX) 400 MG tablet Take 800 mg by mouth as directed.    [name] aspirin EC 81 MG tablet Take 81 mg by mouth daily.    [name] escitalopram (LEXAPRO) 10 MG tablet Take 5 mg by mouth daily.    . ferrous sulfate 324 (65 Fe) MG TBEC Take 65 mg by mouth daily.    . fluticasone (FLONASE) 50 MCG/ACT nasal spray Place 1 spray into both nostrils daily as needed for allergies.    [name] levothyroxine (SYNTHROID, LEVOTHROID) 50 MCG tablet Take 50 mcg by mouth daily.    [name] lisinopril (PRINIVIL,ZESTRIL) 10 MG tablet Take 1 tablet (10 mg total) by mouth daily. 30 tablet 1  . pantoprazole (PROTONIX) 40 MG tablet Take 40 mg by mouth daily.    [name] rOPINIRole  (REQUIP) 0.5 MG tablet Take 0.5 mg by mouth at bedtime.    . traMADol (ULTRAM) 50 MG tablet Take 1 tablet (50 mg total) by mouth every 6 (six) hours as needed for severe pain. 28 tablet 0  . metoprolol tartrate (LOPRESSOR) 25 MG tablet Take 0.5 tablets (12.5 mg total) by mouth 2 (two) times daily. 90 tablet 3   No current facility-administered medications for this visit.     Allergies:   Penicillins; Lincomycin; Naproxen sodium; Statins; Latex; and Tape    Social History:  The patient  reports that she has never smoked. She has never used smokeless tobacco. She reports that she does not drink alcohol or use drugs.   Family History:  The patient's family history includes CAD in her father; Coronary artery disease in her brother and sister; Stroke in her mother.    ROS:   Noted in current history, otherwise review of systems is negative.   Physical Exam: Blood pressure 124/62, pulse (!) 53, height 4\' 9"  (1.448 m), weight 128 lb (58.1 kg), SpO2 98 %.  GEN:  Well nourished, well developed in no acute distress HEENT: Normal NECK: No JVD; No carotid bruits LYMPHATICS: No lymphadenopathy CARDIAC: RR RESPIRATORY:  Clear to auscultation without rales, wheezing or rhonchi  ABDOMEN: Soft, non-tender, non-distended MUSCULOSKELETAL:  No edema; No deformity  SKIN: Warm and dry NEUROLOGIC:  Alert and oriented x 3   EKG:   May 01, 2018: Sinus bradycardia 53 beats a minute.  Possible anterior wall myocardial infarction    Recent Labs: 07/26/2017: ALT 9; BUN 14; Creatinine, Ser 0.66; Hemoglobin 12.0; Platelets 368; Potassium 4.7; Sodium 140; TSH 1.200    Lipid Panel    Component Value Date/Time   CHOL 319 (H) 07/26/2017 1006   TRIG 169 (H) 07/26/2017 1006   HDL 61 07/26/2017 1006   CHOLHDL 5.2 (H) 07/26/2017 1006   CHOLHDL 5.3 07/05/2016 0353   VLDL 37 07/05/2016 0353   LDLCALC 224 (H) 07/26/2017 1006      Wt Readings from Last 3 Encounters:  05/01/18 128 lb (58.1 kg)  03/07/18  128 lb 9.6 oz (58.3 kg)  09/05/17 119 lb 12.8 oz (54.3 kg)      Other studies Reviewed: Additional studies/ records that were reviewed today include: . Review of the above records demonstrates:    ASSESSMENT AND PLAN:  1. Coronary artery disease: doing well    2. Pure  Hyperlipidemia :   Was on Repatha but it cost too much.  She is now enrolled in 1 of our research studies.  3. HTN:     BP is stable  4. Fatigue:  Will reduce the Metoprolol to 12. 5 BID    Current medicines are reviewed at length with the patient today.  The patient does not have concerns regarding medicines.  Labs/ tests ordered today include:   Orders Placed This Encounter  Procedures  . EKG 12-Lead     Disposition:   FU with me in 6 months      Mertie Moores, MD  05/01/2018 12:22 PM    [facility] [place], [place], [place]  [address] Phone: [phone]; Fax: [phone]

## 2018-05-01 NOTE — Patient Instructions (Signed)
Medication Instructions:  Your physician has recommended you make the following change in your medication:   DECREASE Metoprolol to 12.5 mg twice daily   Labwork: None Ordered   Testing/Procedures: None Ordered   Follow-Up: Your physician wants you to follow-up in: 6 months with Dr. Nahser. You will receive a reminder letter in the mail two months in advance. If you don't receive a letter, please call our office to schedule the follow-up appointment.   If you need a refill on your cardiac medications before your next appointment, please call your pharmacy.   Thank you for choosing [facility]! Marisela Line, RN [phone]    

## 2018-06-21 ENCOUNTER — Telehealth: Payer: Self-pay | Admitting: *Deleted

## 2018-06-21 NOTE — Telephone Encounter (Signed)
Spoke with subject re: [phone] in the Clear Research study.  Complains of orthopedic issues and will be seeing her physician within the next couple of weeks. Has not taken her study meds since June 04, 2018.  Not sure if she'll be able to continue and wants to speak with her physician about it.  Under any circumstances wants to continue in the study, even possibly as in study off drug.

## 2018-09-03 VITALS — BP 154/56 | HR 49 | Wt 130.0 lb

## 2018-09-03 DIAGNOSIS — Z006 Encounter for examination for normal comparison and control in clinical research program: Secondary | ICD-10-CM

## 2018-09-03 NOTE — Research (Signed)
Katherine Hall to research clinic for visit [phone] in the Clear study.  No complaints, adverse events, or serious adverse events to report.  Subject 1% compliant with medications.  New medication dispensed as subject is willing to re-challenge medication. Next appointment scheduled.  Re-Consented [place] V 6.0 [id number] Local [id number]  Subject met inclusion and exclusion criteria.  The informed consent form, study requirements and expectations were reviewed with the subject and questions and concerns were addressed prior to the signing of the consent form.  The subject verbalized understanding of the trial requirements.  The subject agreed to participate in the Clear trial and signed the informed consent.  The informed consent was obtained prior to performance of any protocol-specific procedures for the subject.  A copy of the signed informed consent was given to the subject and a copy was placed in the subject's medical record.

## 2018-11-05 ENCOUNTER — Ambulatory Visit: Payer: Medicare Other | Admitting: Cardiovascular Disease

## 2018-11-05 ENCOUNTER — Encounter: Payer: Self-pay | Admitting: Cardiovascular Disease

## 2018-11-05 ENCOUNTER — Encounter (INDEPENDENT_AMBULATORY_CARE_PROVIDER_SITE_OTHER): Payer: Self-pay

## 2018-11-05 VITALS — BP 140/60 | HR 49 | Ht <= 58 in | Wt 132.0 lb

## 2018-11-05 DIAGNOSIS — R001 Bradycardia, unspecified: Secondary | ICD-10-CM

## 2018-11-05 DIAGNOSIS — I2510 Atherosclerotic heart disease of native coronary artery without angina pectoris: Secondary | ICD-10-CM

## 2018-11-05 DIAGNOSIS — R5383 Other fatigue: Secondary | ICD-10-CM

## 2018-11-05 MED ORDER — LISINOPRIL 20 MG PO TABS: 20.0000 mg | ORAL_TABLET | Freq: Every day | ORAL | 11 refills | Status: DC

## 2018-11-05 MED ORDER — ASPIRIN EC 81 MG PO TBEC: 81.0000 mg | DELAYED_RELEASE_TABLET | Freq: Every day | ORAL | Status: DC

## 2018-11-05 NOTE — Progress Notes (Signed)
Cardiology Office Note   Date:  11/05/2018   ID:  Katherine Hall, DOB Dec 27, 1940, MRN [id number]  PCP:  [name], MD  Cardiologist:   Mertie Moores, MD , [facility]   Chief Complaint  Patient presents with  . Coronary Artery Disease   Problem list 1. Coronary artery disease-status post coronary artery bypass grafting-  CORONARY ARTERY BYPASS GRAFTING x 5 -LIMA to LAD -SVG to DIAGONAL -SVG to PDA -SEQ SVG OM1 and OM2  2. Hypertension 3. Hyperlipidemia 4. Anemia     Katherine Hall is a 77 y.o. female who presents for follow-up of her coronary artery disease. She had coronary artery bypass grafting in August.  She has had a moderate left pleural effusion Still gradually improving . Needs an order to do cardiac rehab.  Wants to do heart strides at [facility]   Breathing is better.  Still has some chest soreness,  The angina is better   Has very high lipids.   Cannot tolerate statins.  Has seen Fuller [place] to initiate Repatha - working on that  Still sleeping in a recliner  Jan. 8, 2018:  Katherine Hall is doing well. She had coronary artery bypass grafting in August, 2017: Is not doing cardiac rehab.  Has lots of leg pain  Needs to have surgery on her right knee Has SVG harvest site pain .  Does not take her BP regularly  Aug. 22, 2018:  Katherine Hall is still having some chest pains. Described as a discomfort.  Last 5 minutes. She does not know if it worsens with twisting or taking a deep breath  Is not exercising ,.   May 01, 2018: Seen for follow-up of her coronary artery disease.  She is status post coronary artery bypass grafting in August, 2017.  Also has a history of hypertension and hyperlipidemia.  Some various aches and pains .  Has some left neck pain .   Lasts for a few seconds.  Has had right  knee surgery since I last saw her.   No CP or dyspnea , Gets fatigued easily . Does not sleep well   Dec. 2, 2019:  Not exercising  Leg pain  No CP  Has  arthrisit and PAD  Her Lipids are very high. Did well with Repatha but she couldn't afford it  Is in the Clear trial  But couldn't tolerate the Bempidoic acid    Past Medical History:  Diagnosis Date  . Anemia   . Arthritis   . Gastric ulcer    a. h/o bleeding ulcer 2009 requiring 3 pints of blood.  [name] History of removal of cyst    a. 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  [name] HLD (hyperlipidemia)    a. h/o intolerance of statins, prev on Repatha but insurance stopped covering.  [name] HTN (hypertension)     Past Surgical History:  Procedure Laterality Date  . ABDOMINAL HYSTERECTOMY    . APPENDECTOMY    . CARDIAC CATHETERIZATION N/A 07/05/2016   Procedure: Left Heart Cath and Coronary Angiography;  Surgeon: Burnell Blanks, MD;  Location: [facility];  Service: Cardiovascular;  Laterality: N/A;  . CARDIAC SURGERY     cyst, not on heart.  . CORONARY ARTERY BYPASS GRAFT N/A 07/07/2016   Procedure: CORONARY ARTERY BYPASS GRAFTING (CABG) x 5 with endoscopic harvesting of the right greater saphenous vein;  Surgeon: Gaye Pollack, MD;  Location: [facility] OR;  Service: Open Heart Surgery;  Laterality: N/A;  . HEMORRHOID SURGERY    . INTRAOPERATIVE TRANSESOPHAGEAL ECHOCARDIOGRAM N/A 07/07/2016   Procedure: INTRAOPERATIVE TRANSESOPHAGEAL ECHOCARDIOGRAM;  Surgeon: Gaye Pollack, MD;  Location: [facility] OR;  Service: Open Heart Surgery;  Laterality: N/A;  . SHOULDER SURGERY       Current Outpatient Medications  Medication Sig Dispense Refill  . acyclovir (ZOVIRAX) 400 MG tablet Take 800 mg by mouth as directed.    [name] alendronate (FOSAMAX) 70 MG tablet Take 1 tablet by mouth once a week on an empty stomach with water.  Do not eat, drink or lie down for 47mins    . aspirin EC 325 MG tablet Take 1 tablet by mouth daily.    [name] escitalopram (LEXAPRO) 10 MG tablet Take 5 mg by mouth daily.    . ferrous sulfate 324 (65 Fe) MG TBEC Take 65 mg by mouth daily.    .  fluticasone (FLONASE) 50 MCG/ACT nasal spray Place 1 spray into both nostrils daily as needed for allergies.    [name] levothyroxine (SYNTHROID, LEVOTHROID) 50 MCG tablet Take 50 mcg by mouth daily.    [name] lisinopril (PRINIVIL,ZESTRIL) 10 MG tablet Take 1 tablet by mouth daily.    . metoprolol tartrate (LOPRESSOR) 25 MG tablet TAKE 1 TABLET BY MOUTH TWO  TIMES DAILY    . pantoprazole (PROTONIX) 40 MG tablet TAKE 1 TABLET BY MOUTH  TWICE A DAY    . rOPINIRole (REQUIP) 1 MG tablet Take 1 mg by mouth daily.    . traMADol (ULTRAM) 50 MG tablet Take 1 tablet (50 mg total) by mouth every 6 (six) hours as needed for severe pain. 28 tablet 0   No current facility-administered medications for this visit.     Allergies:   Penicillins; Lincomycin; Naproxen sodium; Statins; Latex; and Tape    Social History:  The patient  reports that she has never smoked. She has never used smokeless tobacco. She reports that she does not drink alcohol or use drugs.   Family History:  The patient's family history includes CAD in her father; Coronary artery disease in her brother and sister; Stroke in her mother.    ROS:   Noted in current history, otherwise review of systems is negative.  Physical Exam: Blood pressure 140/60, pulse (!) 49, height 4\' 9"  (1.448 m), weight 132 lb (59.9 kg), SpO2 97 %.  GEN:  Well nourished, well developed in no acute distress HEENT: Normal NECK: No JVD; No carotid bruits LYMPHATICS: No lymphadenopathy CARDIAC: RRR  RESPIRATORY:  Clear to auscultation without rales, wheezing or rhonchi  ABDOMEN: Soft, non-tender, non-distended MUSCULOSKELETAL:  No edema; No deformity  SKIN: Warm and dry NEUROLOGIC:  Alert and oriented x 3   EKG:   May 01, 2018: Sinus bradycardia 53 beats a minute.  Possible anterior wall myocardial infarction    Recent Labs: No results found for requested labs within last 8760 hours.    Lipid Panel    Component Value Date/Time   CHOL 319 (H) 07/26/2017 1006     TRIG 169 (H) 07/26/2017 1006   HDL 61 07/26/2017 1006   CHOLHDL 5.2 (H) 07/26/2017 1006   CHOLHDL 5.3 07/05/2016 0353   VLDL 37 07/05/2016 0353   LDLCALC 224 (H) 07/26/2017 1006      Wt Readings from Last 3 Encounters:  11/05/18 132 lb (59.9 kg)  09/03/18 130 lb (59 kg)  05/01/18 128 lb (58.1 kg)      Other studies Reviewed: Additional studies/ records that were reviewed  today include: . Review of the above records demonstrates:    ASSESSMENT AND PLAN:  1. Coronary artery disease:  No CP  Seems to be stable   2. Pure  Hyperlipidemia :     Repatha worked well but was too expensive Encouraged her to call her insurance company and see if its more affordable at this point.   3. HTN:     BP is a bit high.   Still eats salt on foods.  Increase Lisinopril to 20 mg a day  BMP in 3 weeks .   4. Fatigue:   Will DC metoprolol    Current medicines are reviewed at length with the patient today.  The patient does not have concerns regarding medicines.  Labs/ tests ordered today include:   No orders of the defined types were placed in this encounter.    Disposition:   FU with me in 6 months      Mertie Moores, MD  11/05/2018 9:44 AM    [facility] [place], [place], [place]  [address] Phone: [phone]; Fax: [phone]

## 2018-11-05 NOTE — Patient Instructions (Addendum)
Medication Instructions:  Your physician has recommended you make the following change in your medication:   DECREASE Aspirin to 81 mg daily INCREASE Lisinopril to 20 mg once daily STOP Metoprolol (Lopressor)  If you need a refill on your cardiac medications before your next appointment, please call your pharmacy.    Lab work: Your physician recommends that you return for lab work in: 3 weeks for basic metabolic panel    Testing/Procedures: None Ordered   Follow-Up: At [facility], you and your health needs are our priority.  As part of our continuing mission to provide you with exceptional heart care, we have created designated Provider Care Teams.  These Care Teams include your primary Cardiologist (physician) and Advanced Practice Providers (APPs -  Physician Assistants and Nurse Practitioners) who all work together to provide you with the care you need, when you need it. You will need a follow up appointment in:  6 months.  Please call our office 2 months in advance to schedule this appointment.  You may see Mertie Moores, MD or one of the following Advanced Practice Providers on your designated Care Team: Richardson Dopp, PA-C [place], [place] . Daune Perch, NP  Any Other Special Instructions Will Be Listed Below (If Applicable).

## 2018-11-26 ENCOUNTER — Other Ambulatory Visit: Payer: Medicare Other

## 2018-11-26 DIAGNOSIS — R001 Bradycardia, unspecified: Secondary | ICD-10-CM

## 2018-11-26 DIAGNOSIS — I2510 Atherosclerotic heart disease of native coronary artery without angina pectoris: Secondary | ICD-10-CM

## 2018-11-26 DIAGNOSIS — R5383 Other fatigue: Secondary | ICD-10-CM

## 2018-11-26 LAB — BASIC METABOLIC PANEL
BUN/Creatinine Ratio: 17 (ref 12–28)
BUN: 15 mg/dL (ref 8–27)
CO2: 22 mmol/L (ref 20–29)
Calcium: 9.1 mg/dL (ref 8.7–10.3)
Chloride: 102 mmol/L (ref 96–106)
Creatinine, Ser: 0.89 mg/dL (ref 0.57–1.00)
GFR calc Af Amer: 72 mL/min/{1.73_m2} (ref >59)
GFR calc non Af Amer: 63 mL/min/{1.73_m2} (ref >59)
Glucose: 108 mg/dL — ABNORMAL HIGH (ref 65–99)
Potassium: 4.7 mmol/L (ref 3.5–5.2)
Sodium: 137 mmol/L (ref 134–144)

## 2019-03-01 ENCOUNTER — Telehealth: Payer: Self-pay | Admitting: *Deleted

## 2019-03-01 NOTE — Telephone Encounter (Signed)
Called patient to cancel her 03/04/19 appointment at the clinic.  LMOM

## 2019-04-02 ENCOUNTER — Telehealth: Payer: Self-pay | Admitting: Nurse Practitioner

## 2019-04-02 NOTE — Telephone Encounter (Signed)
   Primary Cardiologist:  Mertie Moores, MD   Patient contacted.  History reviewed.  No symptoms to suggest any unstable cardiac conditions.  Based on discussion, with current pandemic situation, we will be postponing this appointment for [facility] with a plan for f/u in 4 months or sooner if feasible/necessary.  If symptoms change, she has been instructed to contact our office.  She thanked me for the call.   Emmaline Life, RN  04/02/2019 3:23 PM         .

## 2019-04-15 ENCOUNTER — Ambulatory Visit: Payer: Medicare Other | Admitting: Cardiovascular Disease

## 2019-07-04 ENCOUNTER — Telehealth: Payer: Self-pay | Admitting: *Deleted

## 2019-07-04 NOTE — Telephone Encounter (Signed)
Spoke with patient for visit [id number] in the clear research study. Next appointment scheduled 07/30/2019 @0900 

## 2019-07-30 ENCOUNTER — Encounter: Payer: Self-pay | Admitting: Cardiovascular Disease

## 2019-07-30 ENCOUNTER — Ambulatory Visit (INDEPENDENT_AMBULATORY_CARE_PROVIDER_SITE_OTHER): Payer: Medicare Other | Admitting: Cardiovascular Disease

## 2019-07-30 ENCOUNTER — Encounter: Payer: Medicare Other | Admitting: *Deleted

## 2019-07-30 ENCOUNTER — Other Ambulatory Visit: Payer: Self-pay

## 2019-07-30 VITALS — BP 132/82 | HR 77 | Ht <= 58 in | Wt 120.2 lb

## 2019-07-30 VITALS — BP 153/72 | HR 84 | Temp 97.1°F | Wt 119.0 lb

## 2019-07-30 DIAGNOSIS — Z006 Encounter for examination for normal comparison and control in clinical research program: Secondary | ICD-10-CM

## 2019-07-30 DIAGNOSIS — E782 Mixed hyperlipidemia: Secondary | ICD-10-CM

## 2019-07-30 NOTE — Patient Instructions (Signed)
Medication Instructions:  Your provider recommends that you continue on your current medications as directed. Please refer to the Current Medication list given to you today.    Labwork: None  Testing/Procedures: Your physician has requested that you have an ankle brachial index (ABI). During this test an ultrasound and blood pressure cuff are used to evaluate the arteries that supply the arms and legs with blood. Allow thirty minutes for this exam. There are no restrictions or special instructions.  Follow-Up: Your provider wants you to follow-up in: 6 months with Dr. Acie Fredrickson. You will receive a reminder letter in the mail two months in advance. If you don't receive a letter, please call our office to schedule the follow-up appointment.

## 2019-07-30 NOTE — Progress Notes (Signed)
Cardiology Office Note   Date:  07/30/2019   ID:  Katherine Hall, Katherine Hall [birth year], [name]  PCP:  [name], MD  Cardiologist:   Mertie Moores, MD , [place]   No chief complaint on file.  Problem list 1. Coronary artery disease-status post coronary artery bypass grafting-  CORONARY ARTERY BYPASS GRAFTING x 5 -LIMA to LAD -SVG to DIAGONAL -SVG to PDA -SEQ SVG OM1 and OM2  2. Hypertension 3. Hyperlipidemia 4. Anemia     Lali Pollins is a 78 y.o. female who presents for follow-up of her coronary artery disease. She had coronary artery bypass grafting in August. 2017   She has had a moderate left pleural effusion Still gradually improving . Needs an order to do cardiac rehab.  W[facility] to [facility] strides [facility]   Breathing is better.  Still has some chest soreness,  The angina is better   Has very high lipids.   Cannot tolerate statins.  Has seen Fuller Canada to initiate Repatha - working on that  Still sleeping in a recliner  Jan. 8, 2018:  Nataliya is doing well. She had coronary artery bypass grafting in August, 2017: Is not doing cardiac rehab.  Has lots of leg pain  Needs to have surgery on her right knee Has SVG harvest site pain .  Does not take her BP regularly  Aug. 22, 2018:  Ms Ridder is still having some chest pains. Described as a discomfort.  Last 5 minutes. She does not know if it worsens with twisting or taking a deep breath  Is not exercising ,.   May 01, 2018: Seen for follow-up of her coronary artery disease.  She is status post coronary artery bypass grafting in August, 2017.  Also has a history of hypertension and hyperlipidemia.  Some various aches and pains .  Has some left neck pain .   Lasts for a few seconds.  Has had right  knee surgery since I last saw her.   No CP or dyspnea , Gets fatigued easily . Does not sleep well   Dec. 2, 2019:  Not exercising  Leg pain  No CP  Has arthrisit and PAD  Her Lipids are very  high. Did well with Repatha but she couldn't afford it  Is in the Clear trial  But couldn't tolerate the Bempidoic acid    Aug. 25, 2020  Doing well Has some pain in her left leg.  Pain increases with waking  Concerned with PAD  Is in a cholesterol study so we do not have chol levels since 2018.    Past Medical History:  Diagnosis Date  . Anemia   . Arthritis   . Gastric ulcer    a. h/o bleeding ulcer 2009 requiring 3 pints of blood.  [name] History of removal of cyst    a. 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  [name] HLD (hyperlipidemia)    a. h/o intolerance of statins, prev on Repatha but insurance stopped covering.  [name] HTN (hypertension)     Past Surgical History:  Procedure Laterality Date  . ABDOMINAL HYSTERECTOMY    . APPENDECTOMY    . CARDIAC CATHETERIZATION N/A 07/05/2016   Procedure: Left Heart Cath and Coronary Angiography;  Surgeon: Burnell Blanks, MD;  Location: [place] [facility];  Service: Cardiovascular;  Laterality: N/A;  . CARDIAC SURGERY     cyst, not on heart.  . CORONARY ARTERY BYPASS GRAFT N/A  07/07/2016   Procedure: CORONARY ARTERY BYPASS GRAFTING (CABG) x 5 with endoscopic harvesting of the right greater saphenous vein;  Surgeon: Gaye Pollack, MD;  Location: [facility] OR;  Service: Open Heart Surgery;  Laterality: N/A;  . HEMORRHOID SURGERY    . INTRAOPERATIVE TRANSESOPHAGEAL ECHOCARDIOGRAM N/A 07/07/2016   Procedure: INTRAOPERATIVE TRANSESOPHAGEAL ECHOCARDIOGRAM;  Surgeon: Gaye Pollack, MD;  Location: [facility] OR;  Service: Open Heart Surgery;  Laterality: N/A;  . SHOULDER SURGERY       Current Outpatient Medications  Medication Sig Dispense Refill  . acyclovir (ZOVIRAX) 400 MG tablet Take 800 mg by mouth as directed.    [name] alendronate (FOSAMAX) 70 MG tablet Take 1 tablet by mouth once a week on an empty stomach with water.  Do not eat, drink or lie down for 73mins    . aspirin EC 81 MG tablet Take 1 tablet (81 mg total) by mouth  daily.    [name] escitalopram (LEXAPRO) 10 MG tablet Take 5 mg by mouth daily.    . ferrous sulfate 324 (65 Fe) MG TBEC Take 65 mg by mouth daily.    . fluticasone (FLONASE) 50 MCG/ACT nasal spray Place 1 spray into both nostrils daily as needed for allergies.    [name] levothyroxine (SYNTHROID, LEVOTHROID) 50 MCG tablet Take 50 mcg by mouth daily.    [name] lisinopril (ZESTRIL) 10 MG tablet Take 10 mg by mouth 2 (two) times daily.    . pantoprazole (PROTONIX) 40 MG tablet TAKE 1 TABLET BY MOUTH  TWICE A DAY    . rOPINIRole (REQUIP) 1 MG tablet Take 1 mg by mouth daily.    . traMADol (ULTRAM) 50 MG tablet Take 1 tablet (50 mg total) by mouth every 6 (six) hours as needed for severe pain. 28 tablet 0   No current facility-administered medications for this visit.     Allergies:   Penicillins, Lincomycin, Naproxen sodium, Statins, Latex, and Tape    Social History:  The patient  reports that she has never smoked. She has never used smokeless tobacco. She reports that she does not drink alcohol or use drugs.   Family History:  The patient's family history includes CAD in her father; Coronary artery disease in her brother and sister; Stroke in her mother.    ROS:   Noted in current history, otherwise review of systems is negative.  Physical Exam: Blood pressure 132/82, pulse 77, height 4\' 10"  (1.473 m), weight 120 lb 3.2 oz (54.5 kg), SpO2 94 %.  GEN:  Well nourished, well developed in no acute distress HEENT: Normal NECK: No JVD; No carotid bruits LYMPHATICS: No lymphadenopathy CARDIAC: RRR , no murmurs, rubs, gallops RESPIRATORY:  Clear to auscultation without rales, wheezing or rhonchi  ABDOMEN: Soft, non-tender, non-distended MUSCULOSKELETAL:  No edema; No deformity  SKIN: Warm and dry NEUROLOGIC:  Alert and oriented x 3   EKG:     Aug. 25, 2020 :  NSR at 77, RAE    Recent Labs: 11/26/2018: BUN 15; Creatinine, Ser 0.89; Potassium 4.7; Sodium 137    Lipid Panel    Component Value  Date/Time   CHOL 319 (H) 07/26/2017 1006   TRIG 169 (H) 07/26/2017 1006   HDL 61 07/26/2017 1006   CHOLHDL 5.2 (H) 07/26/2017 1006   CHOLHDL 5.3 07/05/2016 0353   VLDL 37 07/05/2016 0353   LDLCALC 224 (H) 07/26/2017 1006      Wt Readings from Last 3 Encounters:  07/30/19 120 lb 3.2 oz (54.5 kg)  07/30/19 119 lb (54 kg)  11/05/18 132 lb (59.9 kg)      Other studies Reviewed: Additional studies/ records that were reviewed today include: . Review of the above records demonstrates:    ASSESSMENT AND PLAN:  1. Coronary artery disease:  No CP  Is intolerent to statins.  Has been in a research study Will contact Jake Bathe and see if I can get any of those results.      2. Pure  Hyperlipidemia :     Repatha worked well but was too expensive  ive encourage her to re-explore these options.  Has tried all the statins  Is intolerent to statins.  Has been in a research study Will contact Jake Bathe and see if I can get any of those results.     3.  Leg pain / weakess,  - may have claudication.  Will get screening ABIs  3. HTN:     . - BP is well controlled.   4. Fatigue:      Current medicines are reviewed at length with the patient today.  The patient does not have concerns regarding medicines.  Labs/ tests ordered today include:   Orders Placed This Encounter  Procedures  . EKG 12-Lead  . VAS US VASCUSCREEN     Disposition:   FU with me in 6 months      Mertie Moores, MD  07/30/2019 11:10 AM    [address], [place], [place]  [address] Phone: [phone]; Fax: [phone]

## 2019-07-31 NOTE — Research (Signed)
Clear Informed Consent   Subject Name: Katherine Hall  Subject met inclusion and exclusion criteria.  The informed consent form, study requirements and expectations were reviewed with the subject and questions and concerns were addressed prior to the signing of the consent form.  The subject verbalized understanding of the trial requirements.  The subject agreed to participate in the Clear trial and signed the informed consent  On8/25/2020  The informed consent was obtained prior to performance of any protocol-specific procedures for the subject.  A copy of the signed informed consent was given to the subject and a copy was placed in the subject's medical record.   [name].  This was a re-consent for Amendment 4  No cos, aes or saes to report.  Next phone call and clinic visit scheduled.

## 2019-08-08 ENCOUNTER — Ambulatory Visit (HOSPITAL_COMMUNITY): Payer: Medicare Other

## 2019-08-20 ENCOUNTER — Other Ambulatory Visit: Payer: Self-pay

## 2019-08-20 ENCOUNTER — Ambulatory Visit (HOSPITAL_COMMUNITY)
Admission: RE | Admit: 2019-08-20 | Discharge: 2019-08-20 | Disposition: A | Discharge status: Home / Self Care | Payer: Medicare Other | Source: Ambulatory Visit | Attending: Cardiovascular Disease | Admitting: Cardiovascular Disease

## 2019-08-20 DIAGNOSIS — E782 Mixed hyperlipidemia: Secondary | ICD-10-CM

## 2019-09-02 ENCOUNTER — Telehealth: Payer: Self-pay | Admitting: *Deleted

## 2019-09-02 NOTE — Telephone Encounter (Signed)
   [facility] Pre-operative Risk Assessment    Request for surgical clearance:  1. What type of surgery is being performed? LUMBAR SPINE RADIO FREQUENCY   2. When is this surgery scheduled? TBD   3. What type of clearance is required (medical clearance vs. Pharmacy clearance to hold med vs. Both)? MEDICAL  4. Are there any medications that need to be held prior to surgery and how long? ASA X 7 DAYS PRIOR TO PROCEDURE   5. Practice name and name of physician performing surgery? [facility]; DR. Gwenlyn Perking BARTKO   6. What is your office phone number [phone]    7.   What is your office fax number [phone]  8.   Anesthesia type (None, local, MAC, general) ? NOT LISTED. LOCAL?    Julaine Hua 09/02/2019, 3:47 PM  _________________________________________________________________   (provider comments below)

## 2019-09-04 ENCOUNTER — Telehealth: Payer: Self-pay | Admitting: *Deleted

## 2019-09-04 ENCOUNTER — Other Ambulatory Visit: Payer: Self-pay | Admitting: Physician Assistant

## 2019-09-04 NOTE — Telephone Encounter (Signed)
Agree that she is at low risk for her back injection.    Ok  To hold ASA for 5-7 days prior Agree with PV eval Lets have her see Dr. Fletcher Anon Thanks

## 2019-09-04 NOTE — Telephone Encounter (Signed)
Sent to scheduling to arrange appointment with Dr. Fletcher Anon for abnormal ABI.

## 2019-09-04 NOTE — Telephone Encounter (Signed)
   Primary Cardiologist: Mertie Moores, MD  Chart reviewed as part of pre-operative protocol coverage. Patient was contacted 09/04/2019 in reference to pre-operative risk assessment for pending surgery as outlined below.  Katherine Hall was last seen on 07/30/19 by Dr. Acie Fredrickson.  Since that day, Katherine Hall has done well. She is s/p CABG x 5 in 2017. Mobility is limited by her left hip pain. She can still complete 4.0 METS without anginal symptoms.   She may hold ASA for 5-7 days prior to procedure.   Therefore, based on ACC/AHA guidelines, the patient would be at acceptable risk for the planned procedure without further cardiovascular testing.   I will route this recommendation to the requesting party via Epic fax function and remove from pre-op pool.  Please call with questions.  Tami Lin Saanya Zieske, PA 09/04/2019, 10:42 AM

## 2019-09-04 NOTE — Telephone Encounter (Signed)
Left message for patient to call and schedule New PV consult appointment with Dr. Fletcher Anon per message from Nelta Numbers, [place] to Fabian Sharp, [place]

## 2019-09-04 NOTE — Telephone Encounter (Signed)
Dr. Acie Fredrickson This pt had CABG x 5 in 2017 and has been stable. We have a request to clear her for a back injection and to hold ASA, which I have completed - this is for left hip pain/arthritis. You saw her on 07/30/19 and she complained of left leg claudication. ABIs performed 08/20/19 showed possible obstruction in her left lower extremity. She wanted to discus her results and I let her know she would need to see one of our PVD providers. Would you like me to forward to Dr. Gwenlyn Found or Dr. Fletcher Anon? I did not think this should hold up her back injection, correct?

## 2019-09-24 ENCOUNTER — Encounter: Payer: Self-pay | Admitting: Cardiovascular Disease

## 2019-09-24 ENCOUNTER — Other Ambulatory Visit: Payer: Self-pay

## 2019-09-24 ENCOUNTER — Ambulatory Visit: Payer: Medicare Other | Admitting: Cardiovascular Disease

## 2019-09-24 VITALS — BP 154/82 | HR 70 | Temp 97.0°F | Ht <= 58 in | Wt 127.0 lb

## 2019-09-24 DIAGNOSIS — E785 Hyperlipidemia, unspecified: Secondary | ICD-10-CM

## 2019-09-24 DIAGNOSIS — I739 Peripheral vascular disease, unspecified: Secondary | ICD-10-CM | POA: Diagnosis not present

## 2019-09-24 DIAGNOSIS — I2510 Atherosclerotic heart disease of native coronary artery without angina pectoris: Secondary | ICD-10-CM | POA: Diagnosis not present

## 2019-09-24 NOTE — Patient Instructions (Signed)
Medication Instructions:  NO CHANGE *If you need a refill on your cardiac medications before your next appointment, please call your pharmacy*  Lab Work: If you have labs (blood work) drawn today and your tests are completely normal, you will receive your results only by: [name] MyChart Message (if you have MyChart) OR . A paper copy in the mail If you have any lab test that is abnormal or we need to change your treatment, we will call you to review the results.  Testing/Procedures: Your physician has requested that you have a lower extremity arterial exercise duplex. During this test, exercise and ultrasound are used to evaluate arterial blood flow in the legs. Allow one hour for this exam. There are no restrictions or special instructions. [facility]  Follow-Up: At [facility], you and your health needs are our priority.  As part of our continuing mission to provide you with exceptional heart care, we have created designated Provider Care Teams.  These Care Teams include your primary Cardiologist (physician) and Advanced Practice Providers (APPs -  Physician Assistants and Nurse Practitioners) who all work together to provide you with the care you need, when you need it.  Your next appointment:   3 months  The format for your next appointment:   In Person  Provider:   [name], MD

## 2019-09-24 NOTE — Progress Notes (Signed)
Cardiology Office Note   Date:  09/27/2019   ID:  Katherine, Hall [birth year], [name]  PCP:  [name], MD  Cardiologist: Dr. Acie Fredrickson  No chief complaint on file.     History of Present Illness: Katherine Hall is a 78 y.o. female who was referred for evaluation of claudication.  She has known history of coronary artery disease status post CABG in 2017, essential hypertension, anemia and hyperlipidemia with intolerance to statins.  She is not diabetic and is a lifelong non-smoker. She was evaluated by Dr. Rachell Cipro at [facility] in 2019 for possible claudication.  At that time, ABI was normal.  The patient recently complained of bilateral calf discomfort with walking especially on the left side and thus she was referred for repeat vascular studies which showed an ABI of 0.96 on the right and 0.85 on the left.  The patient mainly describes left calf discomfort after walking about 100 feet.  This is mostly noticeable with incline walking.  She has no rest pain or lower extremity ulceration.  No chest pain or shortness of breath.  She has known history of chronic back pain and she reports relief of her symptoms in the past with previous injections.  She reports that she is undergoing ablation?!  Today or tomorrow for her lower back pain.    Past Medical History:  Diagnosis Date  . Anemia   . Arthritis   . Gastric ulcer    a. h/o bleeding ulcer 2009 requiring 3 pints of blood.  [name] History of removal of cyst    a. 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  [name] HLD (hyperlipidemia)    a. h/o intolerance of statins, prev on Repatha but insurance stopped covering.  [name] HTN (hypertension)     Past Surgical History:  Procedure Laterality Date  . ABDOMINAL HYSTERECTOMY    . APPENDECTOMY    . CARDIAC CATHETERIZATION N/A 07/05/2016   Procedure: Left Heart Cath and Coronary Angiography;  Surgeon: Burnell Blanks, MD;  Location: [place] [facility];   Service: Cardiovascular;  Laterality: N/A;  . CARDIAC SURGERY     cyst, not on heart.  . CORONARY ARTERY BYPASS GRAFT N/A 07/07/2016   Procedure: CORONARY ARTERY BYPASS GRAFTING (CABG) x 5 with endoscopic harvesting of the right greater saphenous vein;  Surgeon: Gaye Pollack, MD;  Location: [place] [facility] OR;  Service: Open Heart Surgery;  Laterality: N/A;  . HEMORRHOID SURGERY    . INTRAOPERATIVE TRANSESOPHAGEAL ECHOCARDIOGRAM N/A 07/07/2016   Procedure: INTRAOPERATIVE TRANSESOPHAGEAL ECHOCARDIOGRAM;  Surgeon: Gaye Pollack, MD;  Location: [facility];  Service: Open Heart Surgery;  Laterality: N/A;  . SHOULDER SURGERY       Current Outpatient Medications  Medication Sig Dispense Refill  . acyclovir (ZOVIRAX) 400 MG tablet Take 800 mg by mouth as directed.    [name] alendronate (FOSAMAX) 70 MG tablet Take 1 tablet by mouth once a week on an empty stomach with water.  Do not eat, drink or lie down for 80mins    . aspirin EC 81 MG tablet Take 1 tablet (81 mg total) by mouth daily.    [name] escitalopram (LEXAPRO) 10 MG tablet Take 5 mg by mouth daily.    . fluticasone (FLONASE) 50 MCG/ACT nasal spray Place 1 spray into both nostrils daily as needed for allergies.    [name] levothyroxine (SYNTHROID, LEVOTHROID) 50 MCG tablet Take 50 mcg by mouth daily.    [name] lisinopril (ZESTRIL) 10  MG tablet Take 10 mg by mouth 2 (two) times daily.    . pantoprazole (PROTONIX) 40 MG tablet TAKE 1 TABLET BY MOUTH  TWICE A DAY    . rOPINIRole (REQUIP) 1 MG tablet Take 1 mg by mouth daily.    . traMADol (ULTRAM) 50 MG tablet Take 1 tablet (50 mg total) by mouth every 6 (six) hours as needed for severe pain. 28 tablet 0  . ferrous sulfate 324 (65 Fe) MG TBEC Take 65 mg by mouth daily.     No current facility-administered medications for this visit.     Allergies:   Penicillins, Lincomycin, Naproxen sodium, Statins, Latex, and Tape    Social History:  The patient  reports that she has never smoked. She has never used smokeless tobacco. She  reports that she does not drink alcohol or use drugs.   Family History:  The patient's family history includes CAD in her father; Coronary artery disease in her brother and sister; Stroke in her mother.    ROS:  Please see the history of present illness.   Otherwise, review of systems are positive for none.   All other systems are reviewed and negative.    PHYSICAL EXAM: VS:  BP (!) 154/82   Pulse 70   Temp (!) 97 F (36.1 C)   Ht 4\' 9"  (1.448 m)   Wt 127 lb (57.6 kg)   SpO2 98%   BMI 27.48 kg/m  , BMI Body mass index is 27.48 kg/m. GEN: Well nourished, well developed, in no acute distress  HEENT: normal  Neck: no JVD, carotid bruits, or masses Cardiac: RRR; no murmurs, rubs, or gallops,no edema  Respiratory:  clear to auscultation bilaterally, normal work of breathing GI: soft, nontender, nondistended, + BS MS: no deformity or atrophy  Skin: warm and dry, no rash Neuro:  Strength and sensation are intact Psych: euthymic mood, full affect Vascular: Femoral pulses normal bilaterally.  Dorsalis pedis is palpable on both sides.  Posterior tibial is not palpable.   EKG:  EKG is not ordered today.    Recent Labs: 11/26/2018: BUN 15; Creatinine, Ser 0.89; Potassium 4.7; Sodium 137    Lipid Panel    Component Value Date/Time   CHOL 319 (H) 07/26/2017 1006   TRIG 169 (H) 07/26/2017 1006   HDL 61 07/26/2017 1006   CHOLHDL 5.2 (H) 07/26/2017 1006   CHOLHDL 5.3 07/05/2016 0353   VLDL 37 07/05/2016 0353   LDLCALC 224 (H) 07/26/2017 1006      Wt Readings from Last 3 Encounters:  09/24/19 127 lb (57.6 kg)  07/30/19 120 lb 3.2 oz (54.5 kg)  07/30/19 119 lb (54 kg)       No flowsheet data found.    ASSESSMENT AND PLAN:  1.  Left calf claudication: This seems to be significant and lifestyle limiting according to her.  However, severity seems to be out of proportion to physical exam and vascular studies findings.  She does have a palpable dorsalis pedis on both sides.   I wonder if she has another etiology causing her leg pain including spine or arthritis.  I am going to obtain lower extremity arterial duplex to better evaluate this.  We will see how she responds to the upcoming procedure for her lower back and we will reevaluate her symptoms in few months.  2.  Coronary artery disease involving native coronary arteries: Status post CABG in 2017 with no recurrent angina since then.  3.  Hyperlipidemia: Severely elevated  LDL.  She is intolerant to statins and is currently enrolled in a research study.    Disposition:   FU with me in 3 months  Signed,  [name], MD  09/27/2019 1:41 PM    [place]

## 2019-10-01 ENCOUNTER — Ambulatory Visit (HOSPITAL_COMMUNITY)
Admission: RE | Admit: 2019-10-01 | Discharge: 2019-10-01 | Disposition: A | Discharge status: Home / Self Care | Payer: Medicare Other | Source: Ambulatory Visit | Attending: Cardiovascular Disease | Admitting: Cardiovascular Disease

## 2019-10-01 ENCOUNTER — Other Ambulatory Visit: Payer: Self-pay

## 2019-10-01 DIAGNOSIS — I739 Peripheral vascular disease, unspecified: Secondary | ICD-10-CM | POA: Insufficient documentation

## 2019-10-22 ENCOUNTER — Other Ambulatory Visit: Payer: Self-pay

## 2019-10-22 ENCOUNTER — Ambulatory Visit (INDEPENDENT_AMBULATORY_CARE_PROVIDER_SITE_OTHER): Payer: Medicare Other | Admitting: Cardiovascular Disease

## 2019-10-22 VITALS — BP 156/74 | HR 72 | Temp 97.3°F | Ht <= 58 in | Wt 131.4 lb

## 2019-10-22 DIAGNOSIS — E785 Hyperlipidemia, unspecified: Secondary | ICD-10-CM

## 2019-10-22 DIAGNOSIS — I2510 Atherosclerotic heart disease of native coronary artery without angina pectoris: Secondary | ICD-10-CM | POA: Diagnosis not present

## 2019-10-22 DIAGNOSIS — I739 Peripheral vascular disease, unspecified: Secondary | ICD-10-CM

## 2019-10-22 DIAGNOSIS — I10 Essential (primary) hypertension: Secondary | ICD-10-CM | POA: Diagnosis not present

## 2019-10-22 LAB — BASIC METABOLIC PANEL
BUN/Creatinine Ratio: 20 (ref 12–28)
BUN: 17 mg/dL (ref 8–27)
CO2: 23 mmol/L (ref 20–29)
Calcium: 9.3 mg/dL (ref 8.7–10.3)
Chloride: 100 mmol/L (ref 96–106)
Creatinine, Ser: 0.87 mg/dL (ref 0.57–1.00)
GFR calc Af Amer: 74 mL/min/{1.73_m2} (ref >59)
GFR calc non Af Amer: 64 mL/min/{1.73_m2} (ref >59)
Glucose: 92 mg/dL (ref 65–99)
Potassium: 5.6 mmol/L — ABNORMAL HIGH (ref 3.5–5.2)
Sodium: 135 mmol/L (ref 134–144)

## 2019-10-22 LAB — CBC
Hematocrit: 32.7 % — ABNORMAL LOW (ref 34.0–46.6)
Hemoglobin: 10.8 g/dL — ABNORMAL LOW (ref 11.1–15.9)
MCH: 29.9 pg (ref 26.6–33.0)
MCHC: 33 g/dL (ref 31.5–35.7)
MCV: 91 fL (ref 79–97)
Platelets: 403 10*3/uL (ref 150–450)
RBC: 3.61 x10E6/uL — ABNORMAL LOW (ref 3.77–5.28)
RDW: 12.2 % (ref 11.7–15.4)
WBC: 6.5 10*3/uL (ref 3.4–10.8)

## 2019-10-22 MED ORDER — LISINOPRIL 20 MG PO TABS: 10.0000 mg | ORAL_TABLET | Freq: Two times a day (BID) | ORAL | 3 refills | Status: DC

## 2019-10-22 NOTE — Patient Instructions (Addendum)
Medication Instructions:  Increase Lisinopril to 20mg  Twice Daily.   If you need a refill on your cardiac medications before your next appointment, please call your pharmacy.   Lab work: CBC, BMET If you have labs (blood work) drawn today and your tests are completely normal, you will receive your results only by: [place] (if you have MyChart) OR A paper copy in the mail If you have any lab test that is abnormal or we need to change your treatment, we will call you to review the results.  Testing/Procedures: Peripheral Angiogram on December 2nd  Follow-Up: At [facility], you and your health needs are our priority.  As part of our continuing mission to provide you with exceptional heart care, we have created designated Provider Care Teams.  These Care Teams include your primary Cardiologist (physician) and Advanced Practice Providers (APPs -  Physician Assistants and Nurse Practitioners) who all work together to provide you with the care you need, when you need it. You may see Dr Fletcher Anon or one of the following Advanced Practice Providers on your designated Care Team:    Kerin Ransom, PA-C  [place], [place]  Coletta Memos, [place]   Your physician wants you to follow-up in: 1 month after procedure.   Any Other Special Instructions Will Be Listed Below (If Applicable).     [address] [place] [address] Dept: [phone] Loc: [place]  10/22/2019  You are scheduled for a Peripheral Angiogram on Wednesday, December 2 with Dr. [name].  1. Please arrive at the [facility] (Main Entrance A) at [facility]: [address] [place], [place] [address] at 8:30 AM (This time is two hours before your procedure to ensure your preparation). Free valet parking service is available.   Special note: Every effort is made to have your procedure  done on time. Please understand that emergencies sometimes delay scheduled procedures.  2. Diet: Do not eat solid foods after midnight.  The patient may have clear liquids until 5am upon the day of the procedure.  3. Labs: You will need to have blood drawn today.  4. Medication instructions in preparation for your procedure:  On the morning of your procedure, take your Aspirin and any morning medicines NOT listed above.  You may use sips of water.  5. Plan for one night stay--bring personal belongings. 6. Bring a current list of your medications and current insurance cards. 7. You MUST have a responsible person to drive you home. 8. Someone MUST be with you the first 24 hours after you arrive home or your discharge will be delayed. 9. Please wear clothes that are easy to get on and off and wear slip-on shoes.  You will have to have a covid test on Saturday November 28th at 12:35pm at the [facility]). [place] [address]  Thank you for allowing [place] to care for you!   -- [place] [facility]

## 2019-10-22 NOTE — Progress Notes (Signed)
Cardiology Office Note   Date:  10/22/2019   ID:  Katherine Hall, DOB 1941-01-27, [name]  PCP:  [name], MD  Cardiologist: Dr. Acie Fredrickson  No chief complaint on file.     History of Present Illness: Katherine Hall is a 78 y.o. female who is here today for follow-up regarding peripheral arterial disease.   She has known history of coronary artery disease status post CABG in 2017, essential hypertension, anemia and hyperlipidemia with intolerance to statins.  She is not diabetic and is a lifelong non-smoker. She was evaluated by Dr. Rachell Cipro at [facility] in 2019 for possible claudication.  At that time, ABI was normal.  The patient recently complained of bilateral calf discomfort with walking especially on the left side and thus she was referred for repeat vascular studies which showed an ABI of 0.96 on the right and 0.85 on the left.  The patient mainly describes left calf discomfort after walking about 100 feet.  This is mostly noticeable with incline walking.    Initially she thought some of her symptoms might related to chronic lower back pain with radiation to her legs.  She had a procedure done for that with no significant improvement.  She reports severe left calf which has been severely lifestyle limiting.  She has no rest pain or lower extremity ulceration.  She underwent lower extremity arterial duplex which confirmed severe mid left SFA stenosis.    Past Medical History:  Diagnosis Date  . Anemia   . Arthritis   . Gastric ulcer    a. h/o bleeding ulcer 2009 requiring 3 pints of blood.  [name] History of removal of cyst    a. 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  [name] HLD (hyperlipidemia)    a. h/o intolerance of statins, prev on Repatha but insurance stopped covering.  [name] HTN (hypertension)     Past Surgical History:  Procedure Laterality Date  . ABDOMINAL HYSTERECTOMY    . APPENDECTOMY    . CARDIAC CATHETERIZATION N/A 07/05/2016   Procedure: Left Heart Cath and Coronary Angiography;  Surgeon: Burnell Blanks, MD;  Location: [place] [facility];  Service: Cardiovascular;  Laterality: N/A;  . CARDIAC SURGERY     cyst, not on heart.  . CORONARY ARTERY BYPASS GRAFT N/A 07/07/2016   Procedure: CORONARY ARTERY BYPASS GRAFTING (CABG) x 5 with endoscopic harvesting of the right greater saphenous vein;  Surgeon: Gaye Pollack, MD;  Location: [place] OR;  Service: Open Heart Surgery;  Laterality: N/A;  . HEMORRHOID SURGERY    . INTRAOPERATIVE TRANSESOPHAGEAL ECHOCARDIOGRAM N/A 07/07/2016   Procedure: INTRAOPERATIVE TRANSESOPHAGEAL ECHOCARDIOGRAM;  Surgeon: Gaye Pollack, MD;  Location: [facility] OR;  Service: Open Heart Surgery;  Laterality: N/A;  . SHOULDER SURGERY       Current Outpatient Medications  Medication Sig Dispense Refill  . acyclovir (ZOVIRAX) 400 MG tablet Take 800 mg by mouth as directed.    [name] alendronate (FOSAMAX) 70 MG tablet Take 1 tablet by mouth once a week on an empty stomach with water.  Do not eat, drink or lie down for 40mins    . aspirin EC 81 MG tablet Take 1 tablet (81 mg total) by mouth daily.    [name] escitalopram (LEXAPRO) 10 MG tablet Take 5 mg by mouth daily.    . ferrous sulfate 324 (65 Fe) MG TBEC Take 65 mg by mouth daily.    . fluticasone (FLONASE) 50 MCG/ACT nasal spray Place  1 spray into both nostrils daily as needed for allergies.    [name] gabapentin (NEURONTIN) 100 MG capsule Take 1 capsule by mouth 3 (three) times daily.    [name] levothyroxine (SYNTHROID, LEVOTHROID) 50 MCG tablet Take 50 mcg by mouth daily.    [name] lisinopril (ZESTRIL) 10 MG tablet Take 10 mg by mouth 2 (two) times daily.    . pantoprazole (PROTONIX) 40 MG tablet TAKE 1 TABLET BY MOUTH  TWICE A DAY    . rOPINIRole (REQUIP) 1 MG tablet Take 1 mg by mouth daily.    . traMADol (ULTRAM) 50 MG tablet Take 1 tablet (50 mg total) by mouth every 6 (six) hours as needed for severe pain. 28 tablet 0   No current facility-administered medications for  this visit.     Allergies:   Penicillins, Lincomycin, Naproxen sodium, Statins, Latex, and Tape    Social History:  The patient  reports that she has never smoked. She has never used smokeless tobacco. She reports that she does not drink alcohol or use drugs.   Family History:  The patient's family history includes CAD in her father; Coronary artery disease in her brother and sister; Stroke in her mother.    ROS:  Please see the history of present illness.   Otherwise, review of systems are positive for none.   All other systems are reviewed and negative.    PHYSICAL EXAM: VS:  BP (!) 156/74   Pulse 72   Temp (!) 97.3 F (36.3 C)   Ht 4\' 9"  (1.448 m)   Wt 131 lb 6.4 oz (59.6 kg)   SpO2 97%   BMI 28.43 kg/m  , BMI Body mass index is 28.43 kg/m. GEN: Well nourished, well developed, in no acute distress  HEENT: normal  Neck: no JVD, carotid bruits, or masses Cardiac: RRR; no murmurs, rubs, or gallops,no edema  Respiratory:  clear to auscultation bilaterally, normal work of breathing GI: soft, nontender, nondistended, + BS MS: no deformity or atrophy  Skin: warm and dry, no rash Neuro:  Strength and sensation are intact Psych: euthymic mood, full affect Vascular: Femoral pulses normal bilaterally.    EKG:  EKG is not ordered today.    Recent Labs: 11/26/2018: BUN 15; Creatinine, Ser 0.89; Potassium 4.7; Sodium 137    Lipid Panel    Component Value Date/Time   CHOL 319 (H) 07/26/2017 1006   TRIG 169 (H) 07/26/2017 1006   HDL 61 07/26/2017 1006   CHOLHDL 5.2 (H) 07/26/2017 1006   CHOLHDL 5.3 07/05/2016 0353   VLDL 37 07/05/2016 0353   LDLCALC 224 (H) 07/26/2017 1006      Wt Readings from Last 3 Encounters:  10/22/19 131 lb 6.4 oz (59.6 kg)  09/24/19 127 lb (57.6 kg)  07/30/19 120 lb 3.2 oz (54.5 kg)       No flowsheet data found.    ASSESSMENT AND PLAN:  1.  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  The patient has not been able to do  much activities due to this discomfort.  She tried walking with no significant improvement.  Vascular testing confirmed severe mid left SFA stenosis.  Given severity of her symptoms and no improvement, I recommend proceeding with angiography and possible endovascular intervention.  I discussed the procedure in details as well as risks and benefits.   2.  Coronary artery disease involving native coronary arteries: Status post CABG in 2017 with no recurrent angina since then.  3.  Hyperlipidemia: Severely  elevated LDL.  She is intolerant to statins and is currently enrolled in a research study.  However, she informed me that she is not on any medication.  Will verify with the research team and if she is no longer in the study, we should consider treatment with a PCSK9 inhibitor or referral to the lipid clinic. Most recent lipid profile showed an LDL of 224.  4.  Essential hypertension: Blood pressure has not been controlled.  I elected to increase lisinopril to 20 mg twice daily.    Disposition:   FU with me in 1 months  Signed,  [name], MD  10/22/2019 9:44 AM    [place]

## 2019-10-22 NOTE — H&P (View-Only) (Signed)
Cardiology Office Note   Date:  10/22/2019   ID:  Katherine Hall, DOB 04-Sep-1941, MRN [id number]  PCP:  [name], MD  Cardiologist: Dr. Acie Fredrickson  No chief complaint on file.     History of Present Illness: Katherine Hall is a 78 y.o. female who is here today for follow-up regarding peripheral arterial disease.   She has known history of coronary artery disease status post CABG in 2017, essential hypertension, anemia and hyperlipidemia with intolerance to statins.  She is not diabetic and is a lifelong non-smoker. She was evaluated by Dr. Rachell Cipro at [facility] in 2019 for possible claudication.  At that time, ABI was normal.  The patient recently complained of bilateral calf discomfort with walking especially on the left side and thus she was referred for repeat vascular studies which showed an ABI of 0.96 on the right and 0.85 on the left.  The patient mainly describes left calf discomfort after walking about 100 feet.  This is mostly noticeable with incline walking.    Initially she thought some of her symptoms might related to chronic lower back pain with radiation to her legs.  She had a procedure done for that with no significant improvement.  She reports severe left calf which has been severely lifestyle limiting.  She has no rest pain or lower extremity ulceration.  She underwent lower extremity arterial duplex which confirmed severe mid left SFA stenosis.    Past Medical History:  Diagnosis Date  . Anemia   . Arthritis   . Gastric ulcer    a. h/o bleeding ulcer 2009 requiring 3 pints of blood.  [name] History of removal of cyst    a. 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  [name] HLD (hyperlipidemia)    a. h/o intolerance of statins, prev on Repatha but insurance stopped covering.  [name] HTN (hypertension)     Past Surgical History:  Procedure Laterality Date  . ABDOMINAL HYSTERECTOMY    . APPENDECTOMY    . CARDIAC CATHETERIZATION N/A 07/05/2016   Procedure: Left Heart Cath and Coronary Angiography;  Surgeon: Burnell Blanks, MD;  Location: [place] [facility] LAB;  Service: Cardiovascular;  Laterality: N/A;  . CARDIAC SURGERY     cyst, not on heart.  . CORONARY ARTERY BYPASS GRAFT N/A 07/07/2016   Procedure: CORONARY ARTERY BYPASS GRAFTING (CABG) x 5 with endoscopic harvesting of the right greater saphenous vein;  Surgeon: Gaye Pollack, MD;  Location: [place] OR;  Service: Open Heart Surgery;  Laterality: N/A;  . HEMORRHOID SURGERY    . INTRAOPERATIVE TRANSESOPHAGEAL ECHOCARDIOGRAM N/A 07/07/2016   Procedure: INTRAOPERATIVE TRANSESOPHAGEAL ECHOCARDIOGRAM;  Surgeon: Gaye Pollack, MD;  Location: [facility] OR;  Service: Open Heart Surgery;  Laterality: N/A;  . SHOULDER SURGERY       Current Outpatient Medications  Medication Sig Dispense Refill  . acyclovir (ZOVIRAX) 400 MG tablet Take 800 mg by mouth as directed.    [name] alendronate (FOSAMAX) 70 MG tablet Take 1 tablet by mouth once a week on an empty stomach with water.  Do not eat, drink or lie down for 53mins    . aspirin EC 81 MG tablet Take 1 tablet (81 mg total) by mouth daily.    [name] escitalopram (LEXAPRO) 10 MG tablet Take 5 mg by mouth daily.    . ferrous sulfate 324 (65 Fe) MG TBEC Take 65 mg by mouth daily.    . fluticasone (FLONASE) 50 MCG/ACT nasal spray Place  1 spray into both nostrils daily as needed for allergies.    [name] gabapentin (NEURONTIN) 100 MG capsule Take 1 capsule by mouth 3 (three) times daily.    [name] levothyroxine (SYNTHROID, LEVOTHROID) 50 MCG tablet Take 50 mcg by mouth daily.    [name] lisinopril (ZESTRIL) 10 MG tablet Take 10 mg by mouth 2 (two) times daily.    . pantoprazole (PROTONIX) 40 MG tablet TAKE 1 TABLET BY MOUTH  TWICE A DAY    . rOPINIRole (REQUIP) 1 MG tablet Take 1 mg by mouth daily.    . traMADol (ULTRAM) 50 MG tablet Take 1 tablet (50 mg total) by mouth every 6 (six) hours as needed for severe pain. 28 tablet 0   No current facility-administered medications for  this visit.     Allergies:   Penicillins, Lincomycin, Naproxen sodium, Statins, Latex, and Tape    Social History:  The patient  reports that she has never smoked. She has never used smokeless tobacco. She reports that she does not drink alcohol or use drugs.   Family History:  The patient's family history includes CAD in her father; Coronary artery disease in her brother and sister; Stroke in her mother.    ROS:  Please see the history of present illness.   Otherwise, review of systems are positive for none.   All other systems are reviewed and negative.    PHYSICAL EXAM: VS:  BP (!) 156/74   Pulse 72   Temp (!) 97.3 F (36.3 C)   Ht 4\' 9"  (1.448 m)   Wt 131 lb 6.4 oz (59.6 kg)   SpO2 97%   BMI 28.43 kg/m  , BMI Body mass index is 28.43 kg/m. GEN: Well nourished, well developed, in no acute distress  HEENT: normal  Neck: no JVD, carotid bruits, or masses Cardiac: RRR; no murmurs, rubs, or gallops,no edema  Respiratory:  clear to auscultation bilaterally, normal work of breathing GI: soft, nontender, nondistended, + BS MS: no deformity or atrophy  Skin: warm and dry, no rash Neuro:  Strength and sensation are intact Psych: euthymic mood, full affect Vascular: Femoral pulses normal bilaterally.    EKG:  EKG is not ordered today.    Recent Labs: 11/26/2018: BUN 15; Creatinine, Ser 0.89; Potassium 4.7; Sodium 137    Lipid Panel    Component Value Date/Time   CHOL 319 (H) 07/26/2017 1006   TRIG 169 (H) 07/26/2017 1006   HDL 61 07/26/2017 1006   CHOLHDL 5.2 (H) 07/26/2017 1006   CHOLHDL 5.3 07/05/2016 0353   VLDL 37 07/05/2016 0353   LDLCALC 224 (H) 07/26/2017 1006      Wt Readings from Last 3 Encounters:  10/22/19 131 lb 6.4 oz (59.6 kg)  09/24/19 127 lb (57.6 kg)  07/30/19 120 lb 3.2 oz (54.5 kg)       No flowsheet data found.    ASSESSMENT AND PLAN:  1.  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  The patient has not been able to do  much activities due to this discomfort.  She tried walking with no significant improvement.  Vascular testing confirmed severe mid left SFA stenosis.  Given severity of her symptoms and no improvement, I recommend proceeding with angiography and possible endovascular intervention.  I discussed the procedure in details as well as risks and benefits.   2.  Coronary artery disease involving native coronary arteries: Status post CABG in 2017 with no recurrent angina since then.  3.  Hyperlipidemia: Severely  elevated LDL.  She is intolerant to statins and is currently enrolled in a research study.  However, she informed me that she is not on any medication.  Will verify with the research team and if she is no longer in the study, we should consider treatment with a PCSK9 inhibitor or referral to the lipid clinic. Most recent lipid profile showed an LDL of 224.  4.  Essential hypertension: Blood pressure has not been controlled.  I elected to increase lisinopril to 20 mg twice daily.    Disposition:   FU with me in 1 months  Signed,  [name], MD  10/22/2019 9:44 AM    [place]

## 2019-11-02 ENCOUNTER — Other Ambulatory Visit (HOSPITAL_COMMUNITY)
Admission: RE | Admit: 2019-11-02 | Discharge: 2019-11-02 | Disposition: A | Discharge status: Home / Self Care | Payer: Medicare Other | Source: Ambulatory Visit | Attending: Cardiovascular Disease | Admitting: Cardiovascular Disease

## 2019-11-02 DIAGNOSIS — Z01812 Encounter for preprocedural laboratory examination: Secondary | ICD-10-CM | POA: Insufficient documentation

## 2019-11-02 DIAGNOSIS — Z20828 Contact with and (suspected) exposure to other viral communicable diseases: Secondary | ICD-10-CM | POA: Insufficient documentation

## 2019-11-03 LAB — NOVEL CORONAVIRUS, NAA (HOSP ORDER, SEND-OUT TO REF LAB; TAT 18-24 HRS): SARS-CoV-2, NAA: NOT DETECTED

## 2019-11-05 ENCOUNTER — Telehealth: Payer: Self-pay | Admitting: *Deleted

## 2019-11-05 NOTE — Telephone Encounter (Addendum)
Pt contacted pre-abdominal aortogram scheduled at [facility] for: Wednesday November 06, 2019 10:30 AM Verified arrival time and place: [facility]) at: 8:30 AM   No solid food after midnight prior to cath, clear liquids until 5 AM day of procedure. Contrast allergy: no   AM meds can be  taken pre-cath with sip of water including: ASA 81 mg   Confirmed patient has responsible adult to drive home post procedure and observe 24 hours after arriving home: yes  Currently, due to Covid-19 pandemic, only one support person will be allowed with patient. Must be the same support person for that patient's entire stay, will be screened and required to wear a mask. They will be asked to wait in the waiting room for the duration of the patient's stay.  Patients are required to wear a mask when they enter the hospital.      COVID-19 Pre-Screening Questions:  . In the past 7 to 10 days have you had a cough,  shortness of breath, headache, congestion, fever (100 or greater) body aches, chills, sore throat, or sudden loss of taste or sense of smell? no . Have you been around anyone with known Covid 19? no . Have you been around anyone who is awaiting Covid 19 test results in the past 7 to 10 days? no . Have you been around anyone who has been exposed to Covid 19, or has mentioned symptoms of Covid 19 within the past 7 to 10 days? no   I reviewed procedure/mask/visitor instructions, Covid-19 screening questions with patient, she verbalized understanding, thanked me for call.

## 2019-11-06 ENCOUNTER — Other Ambulatory Visit: Payer: Self-pay | Admitting: Cardiovascular Disease

## 2019-11-06 ENCOUNTER — Encounter (HOSPITAL_COMMUNITY)
Admission: RE | Disposition: A | Discharge status: Home / Self Care | Payer: Self-pay | Source: Home / Self Care | Attending: Cardiovascular Disease

## 2019-11-06 ENCOUNTER — Other Ambulatory Visit: Payer: Self-pay

## 2019-11-06 ENCOUNTER — Inpatient Hospital Stay (HOSPITAL_COMMUNITY)
Admission: RE | Admit: 2019-11-06 | Discharge: 2019-11-09 | DRG: 253 | Disposition: A | Discharge status: Home / Self Care | Payer: Medicare Other | Attending: Cardiovascular Disease | Admitting: Cardiovascular Disease

## 2019-11-06 DIAGNOSIS — I70212 Atherosclerosis of native arteries of extremities with intermittent claudication, left leg: Secondary | ICD-10-CM | POA: Diagnosis not present

## 2019-11-06 DIAGNOSIS — Z91048 Other nonmedicinal substance allergy status: Secondary | ICD-10-CM

## 2019-11-06 DIAGNOSIS — Z886 Allergy status to analgesic agent status: Secondary | ICD-10-CM

## 2019-11-06 DIAGNOSIS — Z951 Presence of aortocoronary bypass graft: Secondary | ICD-10-CM

## 2019-11-06 DIAGNOSIS — I10 Essential (primary) hypertension: Secondary | ICD-10-CM | POA: Diagnosis present

## 2019-11-06 DIAGNOSIS — D62 Acute posthemorrhagic anemia: Secondary | ICD-10-CM | POA: Diagnosis not present

## 2019-11-06 DIAGNOSIS — I2510 Atherosclerotic heart disease of native coronary artery without angina pectoris: Secondary | ICD-10-CM | POA: Diagnosis present

## 2019-11-06 DIAGNOSIS — R339 Retention of urine, unspecified: Secondary | ICD-10-CM | POA: Diagnosis not present

## 2019-11-06 DIAGNOSIS — I739 Peripheral vascular disease, unspecified: Secondary | ICD-10-CM | POA: Diagnosis present

## 2019-11-06 DIAGNOSIS — R7303 Prediabetes: Secondary | ICD-10-CM

## 2019-11-06 DIAGNOSIS — M1990 Unspecified osteoarthritis, unspecified site: Secondary | ICD-10-CM | POA: Diagnosis present

## 2019-11-06 DIAGNOSIS — Z8249 Family history of ischemic heart disease and other diseases of the circulatory system: Secondary | ICD-10-CM

## 2019-11-06 DIAGNOSIS — Z720 Tobacco use: Secondary | ICD-10-CM

## 2019-11-06 DIAGNOSIS — Z823 Family history of stroke: Secondary | ICD-10-CM

## 2019-11-06 DIAGNOSIS — G8929 Other chronic pain: Secondary | ICD-10-CM | POA: Diagnosis present

## 2019-11-06 DIAGNOSIS — M545 Low back pain: Secondary | ICD-10-CM | POA: Diagnosis present

## 2019-11-06 DIAGNOSIS — I97418 Intraoperative hemorrhage and hematoma of a circulatory system organ or structure complicating other circulatory system procedure: Secondary | ICD-10-CM | POA: Diagnosis not present

## 2019-11-06 DIAGNOSIS — T148XXA Other injury of unspecified body region, initial encounter: Secondary | ICD-10-CM

## 2019-11-06 DIAGNOSIS — E785 Hyperlipidemia, unspecified: Secondary | ICD-10-CM | POA: Diagnosis present

## 2019-11-06 DIAGNOSIS — Z888 Allergy status to other drugs, medicaments and biological substances status: Secondary | ICD-10-CM

## 2019-11-06 DIAGNOSIS — Z79899 Other long term (current) drug therapy: Secondary | ICD-10-CM

## 2019-11-06 DIAGNOSIS — F17210 Nicotine dependence, cigarettes, uncomplicated: Secondary | ICD-10-CM | POA: Diagnosis present

## 2019-11-06 DIAGNOSIS — Y848 Other medical procedures as the cause of abnormal reaction of the patient, or of later complication, without mention of misadventure at the time of the procedure: Secondary | ICD-10-CM | POA: Diagnosis not present

## 2019-11-06 DIAGNOSIS — Z79890 Hormone replacement therapy (postmenopausal): Secondary | ICD-10-CM

## 2019-11-06 DIAGNOSIS — Z880 Allergy status to penicillin: Secondary | ICD-10-CM

## 2019-11-06 DIAGNOSIS — Z789 Other specified health status: Secondary | ICD-10-CM

## 2019-11-06 DIAGNOSIS — I471 Supraventricular tachycardia: Secondary | ICD-10-CM | POA: Diagnosis not present

## 2019-11-06 DIAGNOSIS — Z7982 Long term (current) use of aspirin: Secondary | ICD-10-CM

## 2019-11-06 DIAGNOSIS — Z7983 Long term (current) use of bisphosphonates: Secondary | ICD-10-CM

## 2019-11-06 DIAGNOSIS — R739 Hyperglycemia, unspecified: Secondary | ICD-10-CM | POA: Diagnosis not present

## 2019-11-06 DIAGNOSIS — Z91040 Latex allergy status: Secondary | ICD-10-CM

## 2019-11-06 DIAGNOSIS — I959 Hypotension, unspecified: Secondary | ICD-10-CM | POA: Diagnosis not present

## 2019-11-06 HISTORY — PX: PERIPHERAL VASCULAR BALLOON ANGIOPLASTY: CATH118281

## 2019-11-06 HISTORY — PX: ABDOMINAL AORTOGRAM W/LOWER EXTREMITY: CATH118223

## 2019-11-06 LAB — CBC
HCT: 28.2 % — ABNORMAL LOW (ref 36.0–46.0)
Hemoglobin: 9.2 g/dL — ABNORMAL LOW (ref 12.0–15.0)
MCH: 30.5 pg (ref 26.0–34.0)
MCHC: 32.6 g/dL (ref 30.0–36.0)
MCV: 93.4 fL (ref 80.0–100.0)
Platelets: 369 10*3/uL (ref 150–400)
RBC: 3.02 MIL/uL — ABNORMAL LOW (ref 3.87–5.11)
RDW: 13.5 % (ref 11.5–15.5)
WBC: 11.3 10*3/uL — ABNORMAL HIGH (ref 4.0–10.5)
nRBC: 0 % (ref 0.0–0.2)

## 2019-11-06 LAB — POTASSIUM: Potassium: 4.1 mmol/L (ref 3.5–5.1)

## 2019-11-06 LAB — POCT ACTIVATED CLOTTING TIME: Activated Clotting Time: 224 seconds

## 2019-11-06 SURGERY — ABDOMINAL AORTOGRAM W/LOWER EXTREMITY
Anesthesia: LOCAL | Laterality: Bilateral

## 2019-11-06 MED ORDER — CLOPIDOGREL BISULFATE 75 MG PO TABS
75.0000 mg | ORAL_TABLET | Freq: Every day | ORAL | Status: DC
  Administered 2019-11-07 – 2019-11-09 (×3): 75 mg via ORAL
  Filled 2019-11-06 (×3): qty 1

## 2019-11-06 MED ORDER — SODIUM CHLORIDE 0.9 % IV SOLN: INTRAVENOUS | Status: AC

## 2019-11-06 MED ORDER — HYDRALAZINE HCL 20 MG/ML IJ SOLN
INTRAMUSCULAR | Status: AC
  Filled 2019-11-06: qty 1

## 2019-11-06 MED ORDER — CLOPIDOGREL BISULFATE 75 MG PO TABS: 75.0000 mg | ORAL_TABLET | Freq: Every day | ORAL | 3 refills | Status: DC

## 2019-11-06 MED ORDER — FENTANYL CITRATE (PF) 100 MCG/2ML IJ SOLN
50.0000 ug | Freq: Once | INTRAMUSCULAR | Status: AC
  Administered 2019-11-06: 50 ug via INTRAVENOUS

## 2019-11-06 MED ORDER — ONDANSETRON HCL 4 MG/2ML IJ SOLN
4.0000 mg | Freq: Four times a day (QID) | INTRAMUSCULAR | Status: DC | PRN
  Administered 2019-11-06: 4 mg via INTRAVENOUS
  Filled 2019-11-06: qty 2

## 2019-11-06 MED ORDER — SODIUM CHLORIDE 0.9 % IV SOLN: 250.0000 mL | INTRAVENOUS | Status: DC | PRN

## 2019-11-06 MED ORDER — CLOPIDOGREL BISULFATE 300 MG PO TABS
ORAL_TABLET | ORAL | Status: AC
  Filled 2019-11-06: qty 1

## 2019-11-06 MED ORDER — ONDANSETRON HCL 4 MG/2ML IJ SOLN: 4.0000 mg | Freq: Four times a day (QID) | INTRAMUSCULAR | Status: DC | PRN

## 2019-11-06 MED ORDER — ASPIRIN 81 MG PO CHEW: 81.0000 mg | CHEWABLE_TABLET | ORAL | Status: DC

## 2019-11-06 MED ORDER — MIDAZOLAM HCL 2 MG/2ML IJ SOLN
INTRAMUSCULAR | Status: AC
  Filled 2019-11-06: qty 2

## 2019-11-06 MED ORDER — ROPINIROLE HCL 1 MG PO TABS
2.0000 mg | ORAL_TABLET | Freq: Every day | ORAL | Status: DC
  Administered 2019-11-06 – 2019-11-08 (×3): 2 mg via ORAL
  Filled 2019-11-06 (×3): qty 2

## 2019-11-06 MED ORDER — FENTANYL CITRATE (PF) 100 MCG/2ML IJ SOLN
INTRAMUSCULAR | Status: DC | PRN
  Administered 2019-11-06: 25 ug via INTRAVENOUS

## 2019-11-06 MED ORDER — TRAMADOL HCL 50 MG PO TABS
50.0000 mg | ORAL_TABLET | Freq: Four times a day (QID) | ORAL | Status: DC | PRN
  Administered 2019-11-06 – 2019-11-08 (×5): 50 mg via ORAL
  Filled 2019-11-06 (×5): qty 1

## 2019-11-06 MED ORDER — LABETALOL HCL 5 MG/ML IV SOLN
INTRAVENOUS | Status: DC | PRN
  Administered 2019-11-06: 10 mg via INTRAVENOUS

## 2019-11-06 MED ORDER — ROPINIROLE HCL 1 MG PO TABS: 2.0000 mg | ORAL_TABLET | Freq: Every day | ORAL | Status: DC

## 2019-11-06 MED ORDER — HEPARIN (PORCINE) IN NACL 1000-0.9 UT/500ML-% IV SOLN
INTRAVENOUS | Status: AC
  Filled 2019-11-06: qty 1000

## 2019-11-06 MED ORDER — MIDAZOLAM HCL 2 MG/2ML IJ SOLN
INTRAMUSCULAR | Status: DC | PRN
  Administered 2019-11-06: 1 mg via INTRAVENOUS

## 2019-11-06 MED ORDER — LABETALOL HCL 5 MG/ML IV SOLN
INTRAVENOUS | Status: AC
  Filled 2019-11-06: qty 4

## 2019-11-06 MED ORDER — LEVOTHYROXINE SODIUM 50 MCG PO TABS
50.0000 ug | ORAL_TABLET | Freq: Every day | ORAL | Status: DC
  Administered 2019-11-07 – 2019-11-09 (×3): 50 ug via ORAL
  Filled 2019-11-06 (×3): qty 1

## 2019-11-06 MED ORDER — SODIUM CHLORIDE 0.9% FLUSH: 3.0000 mL | Freq: Two times a day (BID) | INTRAVENOUS | Status: DC

## 2019-11-06 MED ORDER — SODIUM CHLORIDE 0.9% FLUSH: 3.0000 mL | INTRAVENOUS | Status: DC | PRN

## 2019-11-06 MED ORDER — HEPARIN SODIUM (PORCINE) 1000 UNIT/ML IJ SOLN
INTRAMUSCULAR | Status: DC | PRN
  Administered 2019-11-06: 5000 [IU] via INTRAVENOUS

## 2019-11-06 MED ORDER — FLUTICASONE PROPIONATE 50 MCG/ACT NA SUSP
2.0000 | Freq: Every day | NASAL | Status: DC | PRN
  Filled 2019-11-06: qty 16

## 2019-11-06 MED ORDER — HYDRALAZINE HCL 20 MG/ML IJ SOLN
10.0000 mg | Freq: Once | INTRAMUSCULAR | Status: AC
  Administered 2019-11-06: 10 mg via INTRAVENOUS

## 2019-11-06 MED ORDER — HEPARIN (PORCINE) IN NACL 1000-0.9 UT/500ML-% IV SOLN
INTRAVENOUS | Status: DC | PRN
  Administered 2019-11-06 (×2): 500 mL

## 2019-11-06 MED ORDER — LIDOCAINE HCL (PF) 1 % IJ SOLN
INTRAMUSCULAR | Status: DC | PRN
  Administered 2019-11-06: 15 mL

## 2019-11-06 MED ORDER — CLOPIDOGREL BISULFATE 300 MG PO TABS
ORAL_TABLET | ORAL | Status: DC | PRN
  Administered 2019-11-06: 300 mg via ORAL

## 2019-11-06 MED ORDER — LACTATED RINGERS IV BOLUS
1000.0000 mL | Freq: Once | INTRAVENOUS | Status: AC
  Administered 2019-11-07: 1000 mL via INTRAVENOUS

## 2019-11-06 MED ORDER — FENTANYL CITRATE (PF) 100 MCG/2ML IJ SOLN
INTRAMUSCULAR | Status: AC
  Filled 2019-11-06: qty 2

## 2019-11-06 MED ORDER — SODIUM CHLORIDE 0.9 % IV SOLN
Freq: Once | INTRAVENOUS | Status: AC
  Administered 2019-11-06: 17:00:00 via INTRAVENOUS

## 2019-11-06 MED ORDER — SODIUM CHLORIDE 0.9 % IV SOLN
INTRAVENOUS | Status: DC
  Administered 2019-11-06: 10:00:00 via INTRAVENOUS

## 2019-11-06 MED ORDER — LIDOCAINE HCL (PF) 1 % IJ SOLN
INTRAMUSCULAR | Status: AC
  Filled 2019-11-06: qty 30

## 2019-11-06 MED ORDER — PANTOPRAZOLE SODIUM 40 MG PO TBEC
40.0000 mg | DELAYED_RELEASE_TABLET | Freq: Two times a day (BID) | ORAL | Status: DC
  Administered 2019-11-06 – 2019-11-09 (×6): 40 mg via ORAL
  Filled 2019-11-06 (×6): qty 1

## 2019-11-06 MED ORDER — ESCITALOPRAM OXALATE 10 MG PO TABS
10.0000 mg | ORAL_TABLET | Freq: Every day | ORAL | Status: DC
  Administered 2019-11-07 – 2019-11-09 (×3): 10 mg via ORAL
  Filled 2019-11-06 (×3): qty 1

## 2019-11-06 MED ORDER — HEPARIN SODIUM (PORCINE) 1000 UNIT/ML IJ SOLN
INTRAMUSCULAR | Status: AC
  Filled 2019-11-06: qty 1

## 2019-11-06 MED ORDER — NITROGLYCERIN 0.4 MG SL SUBL: 0.4000 mg | SUBLINGUAL_TABLET | SUBLINGUAL | Status: DC | PRN

## 2019-11-06 MED ORDER — FERROUS SULFATE 325 (65 FE) MG PO TABS
325.0000 mg | ORAL_TABLET | ORAL | Status: DC
  Administered 2019-11-08: 325 mg via ORAL
  Filled 2019-11-06 (×3): qty 1

## 2019-11-06 MED ORDER — LISINOPRIL 20 MG PO TABS
20.0000 mg | ORAL_TABLET | Freq: Two times a day (BID) | ORAL | Status: DC
  Filled 2019-11-06: qty 1
  Filled 2019-11-06: qty 2

## 2019-11-06 MED ORDER — ACETAMINOPHEN 325 MG PO TABS: 650.0000 mg | ORAL_TABLET | ORAL | Status: DC | PRN

## 2019-11-06 MED ORDER — LABETALOL HCL 5 MG/ML IV SOLN: 10.0000 mg | INTRAVENOUS | Status: DC | PRN

## 2019-11-06 MED ORDER — IODIXANOL 320 MG/ML IV SOLN
INTRAVENOUS | Status: DC | PRN
  Administered 2019-11-06: 150 mL

## 2019-11-06 MED ORDER — SODIUM CHLORIDE 0.9 % IV SOLN
INTRAVENOUS | Status: AC | PRN
  Administered 2019-11-06: 75 mL/h via INTRAVENOUS

## 2019-11-06 MED ORDER — ACETAMINOPHEN 325 MG PO TABS
650.0000 mg | ORAL_TABLET | ORAL | Status: DC | PRN
  Administered 2019-11-06 – 2019-11-08 (×5): 650 mg via ORAL
  Filled 2019-11-06 (×5): qty 2

## 2019-11-06 MED ORDER — GABAPENTIN 100 MG PO CAPS
100.0000 mg | ORAL_CAPSULE | Freq: Three times a day (TID) | ORAL | Status: DC
  Administered 2019-11-06 – 2019-11-09 (×8): 100 mg via ORAL
  Filled 2019-11-06 (×8): qty 1

## 2019-11-06 MED ORDER — SODIUM CHLORIDE 0.9% FLUSH
3.0000 mL | Freq: Two times a day (BID) | INTRAVENOUS | Status: DC
  Administered 2019-11-07 – 2019-11-09 (×4): 3 mL via INTRAVENOUS

## 2019-11-06 SURGICAL SUPPLY — 24 items
BALLN CHOCOLATE 4.0X40X135 (BALLOONS) ×3
BALLN IN.PACT DCB 5X60 (BALLOONS) ×6
BALLOON CHOCOLATE 4.0X40X135 (BALLOONS) IMPLANT
CATH ANGIO 5F PIGTAIL 65CM (CATHETERS) ×1 IMPLANT
CATH CROSS OVER TEMPO 5F (CATHETERS) ×1 IMPLANT
CATH SOFT-VU ST 4F 90CM (CATHETERS) ×3 IMPLANT
CLOSURE MYNX CONTROL 6F/7F (Vascular Products) ×1 IMPLANT
DCB IN.PACT 5X60 (BALLOONS) IMPLANT
KIT ENCORE 26 ADVANTAGE (KITS) ×1 IMPLANT
KIT MICROPUNCTURE NIT STIFF (SHEATH) ×1 IMPLANT
KIT PV (KITS) ×3 IMPLANT
SHEATH PINNACLE 5F 10CM (SHEATH) ×1 IMPLANT
SHEATH PINNACLE 6F 10CM (SHEATH) ×1 IMPLANT
SHEATH PINNACLE ST 6F 45CM (SHEATH) ×1 IMPLANT
SHEATH PROBE COVER 6X72 (BAG) ×1 IMPLANT
SYR MEDRAD MARK 7 150ML (SYRINGE) ×3 IMPLANT
TAPE VIPERTRACK RADIOPAQ (MISCELLANEOUS) IMPLANT
TAPE VIPERTRACK RADIOPAQUE (MISCELLANEOUS) ×1
TRANSDUCER W/STOPCOCK (MISCELLANEOUS) ×3 IMPLANT
TRAY PV CATH (CUSTOM PROCEDURE TRAY) ×3 IMPLANT
TUBING CIL FLEX 10 FLL-RA (TUBING) ×1 IMPLANT
WIRE BENTSON .035X145CM (WIRE) ×1 IMPLANT
WIRE HI TORQ VERSACORE J 260CM (WIRE) ×1 IMPLANT
WIRE SPARTACORE .014X300CM (WIRE) ×1 IMPLANT

## 2019-11-06 NOTE — Progress Notes (Addendum)
Femstop removed at 2100. Groin site level 2. Area marked for comparison. Strong pulses on doppler. Pt continues to feel nauseous. Bps sustained 80s-90s/50s. Verbal orders given to give 300 ml bolus. Will continue to monitor.    Fransico Michael, RN

## 2019-11-06 NOTE — Progress Notes (Signed)
    Called to the bedside as patient re developed a hematoma at right femoral cath site. RN holding pressure at the bedside. Patient became hypotensive briefly but Bp recovered. Dr. Fletcher Anon at the bedside and request patient to be admitted given 2nd occurrence of hematoma. Site currently stable. Patient feeling better and nausea improved while at the bedside. Orders placed for admission for overnight observation.   SignedReino Bellis, NP-C 11/06/2019, 2:30 PM Pager: [phone]

## 2019-11-06 NOTE — Discharge Instructions (Signed)

## 2019-11-06 NOTE — Interval H&P Note (Signed)
History and Physical Interval Note:  11/06/2019 10:45 AM  [name]  has presented today for surgery, with the diagnosis of pad.  The various methods of treatment have been discussed with the patient and family. After consideration of risks, benefits and other options for treatment, the patient has consented to  Procedure(s): ABDOMINAL AORTOGRAM W/LOWER EXTREMITY (N/A) as a surgical intervention.  The patient's history has been reviewed, patient examined, no change in status, stable for surgery.  I have reviewed the patient's chart and labs.  Questions were answered to the patient's satisfaction.     Katherine Hall

## 2019-11-06 NOTE — Progress Notes (Signed)
Patient arrived from cath lab on a hospital bed from PACU, placed on tele ccmd notified, patient oriented to room and staff, bed in lowest position call bell within reach will continue to monitor. Vital signs obtained see flowsheet. Instructions received from [facility] PA to give scheduled plavix at 9pm tonight if patient does not bleed from her right groin incision, femstop in place, no bleeding noted at this time will continue to monitor.

## 2019-11-06 NOTE — Progress Notes (Signed)
Pt states her leg is hurting at the right groin.  The area is firm.  Pt states she is going to be sick.  Vomited.  I started holding pressure and called for assistance.  Pt was placed on O2 and in trendelenburg position.  Dr Fletcher Anon was paged and came to assess pt.  Dr Fletcher Anon held pressure x 15 minutes and asked me to hold pressure 15 more.  Pt to be admitted.

## 2019-11-06 NOTE — Progress Notes (Signed)
Pt taken to 4E 24 without incident.  Femstop firmly in place DP pulses to (R) foot strong.  VSS.  Update given to pt's nurse Marchia Bond

## 2019-11-06 NOTE — Progress Notes (Addendum)
Charge nurse, Cyril Mourning,  notified that pt had a fem stop.  Agreed to take pt and will update her nurse who I called report to ie Sola  Pt also has stable VS including BP

## 2019-11-06 NOTE — Progress Notes (Signed)
Dr Fletcher Anon notified of pt's BP.  NS bolus ordered of 500cc.  Dr Fletcher Anon to see pt before transfer to 4E

## 2019-11-06 NOTE — Progress Notes (Addendum)
Pt complained of (R) groin pain again. Site firm beyond where her hematoma was marked. Holding pressure  Dr Fletcher Anon notified.  Also cardiology PA.  Iona Beard from cath lab came and applied fem stop.[name]

## 2019-11-06 NOTE — Progress Notes (Signed)
Pt was received from the [facility] lab, after her peripheral intervention by Dr Fletcher Anon.  She was Mynxed originaly, but developed a hematoma. Manual pressure was held in the [facility] lab by the staff, and she was brought to Recovery area for observation. During the transfer she developed a hematoma again and pressure was held and Dr Fletcher Anon was notified. He came over, and held pressure for 10 more min. The groin is level one now and not tender anymore.

## 2019-11-06 NOTE — Progress Notes (Signed)
Groin site remains stable.  Pt resting

## 2019-11-06 NOTE — Progress Notes (Signed)
I have been holding pressure for 30 minutes.  Pt had BM and Even though groin was held during BM and bedpan, Her hematoma returned.  Pressure held an additional 20 minutes.  Groin site soft.  Pt states it no longer hurts.

## 2019-11-07 ENCOUNTER — Observation Stay (HOSPITAL_COMMUNITY): Payer: Medicare Other

## 2019-11-07 ENCOUNTER — Encounter (HOSPITAL_COMMUNITY): Payer: Self-pay | Admitting: Cardiovascular Disease

## 2019-11-07 DIAGNOSIS — M545 Low back pain: Secondary | ICD-10-CM | POA: Diagnosis present

## 2019-11-07 DIAGNOSIS — Z823 Family history of stroke: Secondary | ICD-10-CM | POA: Diagnosis not present

## 2019-11-07 DIAGNOSIS — F17210 Nicotine dependence, cigarettes, uncomplicated: Secondary | ICD-10-CM | POA: Diagnosis present

## 2019-11-07 DIAGNOSIS — I97638 Postprocedural hematoma of a circulatory system organ or structure following other circulatory system procedure: Secondary | ICD-10-CM

## 2019-11-07 DIAGNOSIS — Z886 Allergy status to analgesic agent status: Secondary | ICD-10-CM | POA: Diagnosis not present

## 2019-11-07 DIAGNOSIS — Z880 Allergy status to penicillin: Secondary | ICD-10-CM | POA: Diagnosis not present

## 2019-11-07 DIAGNOSIS — D62 Acute posthemorrhagic anemia: Secondary | ICD-10-CM | POA: Diagnosis not present

## 2019-11-07 DIAGNOSIS — Z720 Tobacco use: Secondary | ICD-10-CM | POA: Diagnosis not present

## 2019-11-07 DIAGNOSIS — I471 Supraventricular tachycardia: Secondary | ICD-10-CM | POA: Diagnosis not present

## 2019-11-07 DIAGNOSIS — Y848 Other medical procedures as the cause of abnormal reaction of the patient, or of later complication, without mention of misadventure at the time of the procedure: Secondary | ICD-10-CM | POA: Diagnosis not present

## 2019-11-07 DIAGNOSIS — Z8249 Family history of ischemic heart disease and other diseases of the circulatory system: Secondary | ICD-10-CM | POA: Diagnosis not present

## 2019-11-07 DIAGNOSIS — I70212 Atherosclerosis of native arteries of extremities with intermittent claudication, left leg: Secondary | ICD-10-CM | POA: Diagnosis present

## 2019-11-07 DIAGNOSIS — Z79899 Other long term (current) drug therapy: Secondary | ICD-10-CM | POA: Diagnosis not present

## 2019-11-07 DIAGNOSIS — I2510 Atherosclerotic heart disease of native coronary artery without angina pectoris: Secondary | ICD-10-CM | POA: Diagnosis present

## 2019-11-07 DIAGNOSIS — I97418 Intraoperative hemorrhage and hematoma of a circulatory system organ or structure complicating other circulatory system procedure: Secondary | ICD-10-CM | POA: Diagnosis not present

## 2019-11-07 DIAGNOSIS — T148XXA Other injury of unspecified body region, initial encounter: Secondary | ICD-10-CM | POA: Diagnosis not present

## 2019-11-07 DIAGNOSIS — Z7983 Long term (current) use of bisphosphonates: Secondary | ICD-10-CM | POA: Diagnosis not present

## 2019-11-07 DIAGNOSIS — I10 Essential (primary) hypertension: Secondary | ICD-10-CM | POA: Diagnosis present

## 2019-11-07 DIAGNOSIS — Z7982 Long term (current) use of aspirin: Secondary | ICD-10-CM | POA: Diagnosis not present

## 2019-11-07 DIAGNOSIS — Z91048 Other nonmedicinal substance allergy status: Secondary | ICD-10-CM | POA: Diagnosis not present

## 2019-11-07 DIAGNOSIS — Z888 Allergy status to other drugs, medicaments and biological substances status: Secondary | ICD-10-CM | POA: Diagnosis not present

## 2019-11-07 DIAGNOSIS — I739 Peripheral vascular disease, unspecified: Secondary | ICD-10-CM

## 2019-11-07 DIAGNOSIS — I959 Hypotension, unspecified: Secondary | ICD-10-CM | POA: Diagnosis not present

## 2019-11-07 DIAGNOSIS — S7011XA Contusion of right thigh, initial encounter: Secondary | ICD-10-CM

## 2019-11-07 DIAGNOSIS — G8929 Other chronic pain: Secondary | ICD-10-CM | POA: Diagnosis present

## 2019-11-07 DIAGNOSIS — Z951 Presence of aortocoronary bypass graft: Secondary | ICD-10-CM | POA: Diagnosis not present

## 2019-11-07 DIAGNOSIS — I70202 Unspecified atherosclerosis of native arteries of extremities, left leg: Secondary | ICD-10-CM | POA: Diagnosis present

## 2019-11-07 DIAGNOSIS — Z79890 Hormone replacement therapy (postmenopausal): Secondary | ICD-10-CM | POA: Diagnosis not present

## 2019-11-07 DIAGNOSIS — M1990 Unspecified osteoarthritis, unspecified site: Secondary | ICD-10-CM | POA: Diagnosis present

## 2019-11-07 DIAGNOSIS — E785 Hyperlipidemia, unspecified: Secondary | ICD-10-CM | POA: Diagnosis present

## 2019-11-07 DIAGNOSIS — Z91040 Latex allergy status: Secondary | ICD-10-CM | POA: Diagnosis not present

## 2019-11-07 LAB — BASIC METABOLIC PANEL
Anion gap: 8 (ref 5–15)
BUN: 17 mg/dL (ref 8–23)
CO2: 15 mmol/L — ABNORMAL LOW (ref 22–32)
Calcium: 7.2 mg/dL — ABNORMAL LOW (ref 8.9–10.3)
Chloride: 110 mmol/L (ref 98–111)
Creatinine, Ser: 0.96 mg/dL (ref 0.44–1.00)
GFR calc Af Amer: >60 mL/min (ref >60)
GFR calc non Af Amer: 57 mL/min — ABNORMAL LOW (ref >60)
Glucose, Bld: 208 mg/dL — ABNORMAL HIGH (ref 70–99)
Potassium: 4 mmol/L (ref 3.5–5.1)
Sodium: 133 mmol/L — ABNORMAL LOW (ref 135–145)

## 2019-11-07 LAB — CBC
HCT: 23.9 % — ABNORMAL LOW (ref 36.0–46.0)
Hemoglobin: 7.5 g/dL — ABNORMAL LOW (ref 12.0–15.0)
MCH: 29.6 pg (ref 26.0–34.0)
MCHC: 31.4 g/dL (ref 30.0–36.0)
MCV: 94.5 fL (ref 80.0–100.0)
Platelets: 321 10*3/uL (ref 150–400)
RBC: 2.53 MIL/uL — ABNORMAL LOW (ref 3.87–5.11)
RDW: 13.5 % (ref 11.5–15.5)
WBC: 11.2 10*3/uL — ABNORMAL HIGH (ref 4.0–10.5)
nRBC: 0 % (ref 0.0–0.2)

## 2019-11-07 LAB — HEMOGLOBIN AND HEMATOCRIT, BLOOD
HCT: 23.5 % — ABNORMAL LOW (ref 36.0–46.0)
HCT: 23.3 % — ABNORMAL LOW (ref 36.0–46.0)
Hemoglobin: 7.8 g/dL — ABNORMAL LOW (ref 12.0–15.0)
Hemoglobin: 7.7 g/dL — ABNORMAL LOW (ref 12.0–15.0)

## 2019-11-07 MED ORDER — IOHEXOL 350 MG/ML SOLN
100.0000 mL | Freq: Once | INTRAVENOUS | Status: AC | PRN
  Administered 2019-11-07: 100 mL via INTRAVENOUS

## 2019-11-07 MED ORDER — LACTATED RINGERS IV BOLUS
1000.0000 mL | Freq: Once | INTRAVENOUS | Status: AC
  Administered 2019-11-07: 1000 mL via INTRAVENOUS

## 2019-11-07 MED ORDER — SODIUM CHLORIDE 0.9% IV SOLUTION
Freq: Once | INTRAVENOUS | Status: AC
  Administered 2019-11-07: 06:00:00 via INTRAVENOUS

## 2019-11-07 NOTE — Progress Notes (Signed)
RT obtained arterial lab for type & screen after 2nd attempt.

## 2019-11-07 NOTE — Progress Notes (Signed)
Pt bladder scanned due to no urine output. Scan reveals 836. Verbal order per PA Dunn for in/out cath. Will re scan for post void residual x2 hrs.  Jerald Kief, RN

## 2019-11-07 NOTE — Consult Note (Signed)
[facility] [place]  Patient name: Katherine Hall MRN: [id number] DOB: [birth year] Sex: female   REQUESTING PROVIDER:    Cardiology    REASON FOR CONSULT:    Groin hematoma following angiogram  HISTORY OF PRESENT ILLNESS:   Katherine Hall is a 78 y.o. female, who earlier today underwent drug-coated balloon angioplasty to the mid left superficial femoral artery via a right femoral approach by Dr. Fletcher Anon on 11/06/2019.  This was done for 100 feet claudication.  A minx device was deployed for closure.  She initially developed a hematoma which was treated with manual pressure while in the PV lab.  During transfer to recovery room she developed hematoma and additional pressure was held.  This happened multiple times and ultimately a FemoStop was placed.  Throughout the course of the night, her thigh became more tense.  Her hemoglobin had dropped from 9.2-7.5.  I was asked to evaluate the patient.  Patient has a history of coronary artery disease and is status post CABG in 2017.  She is medically managed for hypertension.  She suffers from hyperlipidemia however has an intolerance to statins.  She has been referred to the lipid clinic she is a lifelong smoker.  PAST MEDICAL HISTORY    Past Medical History:  Diagnosis Date  . Anemia   . Arthritis   . Gastric ulcer    a. h/o bleeding ulcer 2009 requiring 3 pints of blood.  [name] History of removal of cyst    a. 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  [name] HLD (hyperlipidemia)    a. h/o intolerance of statins, prev on Repatha but insurance stopped covering.  [name] HTN (hypertension)      FAMILY HISTORY   Family History  Problem Relation Age of Onset  . Stroke Mother        Stroke age 60  . CAD Father        died of MI age 74  . Coronary artery disease Sister        one sister had massive MI at 42, another sister has 37 stents  . Coronary artery disease Brother         one brother had MI at age 77 and died at 32, another has had 4 MIs and 2 bypasses    SOCIAL HISTORY:   Social History   Socioeconomic History  . Marital status: Married    Spouse name: Not on file  . Number of children: Not on file  . Years of education: Not on file  . Highest education level: Not on file  Occupational History  . Not on file  Social Needs  . Financial resource strain: Not on file  . Food insecurity    Worry: Not on file    Inability: Not on file  . Transportation needs    Medical: Not on file    Non-medical: Not on file  Tobacco Use  . Smoking status: Never Smoker  . Smokeless tobacco: Never Used  Substance and Sexual Activity  . Alcohol use: No  . Drug use: No  . Sexual activity: Not on file  Lifestyle  . Physical activity    Days per week: Not on file    Minutes per session: Not on file  . Stress: Not on file  Relationships  . Social Herbalist on phone: Not on file    Gets together: Not on file    Attends religious service:  Not on file    Active member of club or organization: Not on file    Attends meetings of clubs or organizations: Not on file    Relationship status: Not on file  . Intimate partner violence    Fear of current or ex partner: Not on file    Emotionally abused: Not on file    Physically abused: Not on file    Forced sexual activity: Not on file  Other Topics Concern  . Not on file  Social History Narrative  . Not on file    ALLERGIES:    Allergies  Allergen Reactions  . Penicillins Anaphylaxis    Did it involve swelling of the face/tongue/throat, SOB, or low BP? Yes Did it involve sudden or severe rash/hives, skin peeling, or any reaction on the inside of your mouth or nose? No Did you need to seek medical attention at a hospital or doctor's office? Yes When did it last happen?10+ years If all above answers are "NO", may proceed with cephalosporin use.    . Lincomycin Swelling and Other  (See Comments)    passed out  . Naproxen Sodium Other (See Comments)    Stomach ulcers  . Statins Other (See Comments)    Myalgias  . Latex Other (See Comments)    Blisters when in contact with skin  . Tape     blistering    CURRENT MEDICATIONS:    Current Facility-Administered Medications  Medication Dose Route Frequency Provider Last Rate Last Dose  . 0.9 %  sodium chloride infusion  250 mL Intravenous PRN [name] A, MD      . acetaminophen (TYLENOL) tablet 650 mg  650 mg Oral Q4H PRN Wellington Hampshire, MD   650 mg at 11/06/19 2053  . clopidogrel (PLAVIX) tablet 75 mg  75 mg Oral Daily Reino Bellis B, NP      . escitalopram (LEXAPRO) tablet 10 mg  10 mg Oral Daily Reino Bellis B, NP      . ferrous sulfate tablet 325 mg  325 mg Oral Q M,W,F Reino Bellis B, NP      . fluticasone (FLONASE) 50 MCG/ACT nasal spray 2 spray  2 spray Each Nare Daily PRN Reino Bellis B, NP      . gabapentin (NEURONTIN) capsule 100 mg  100 mg Oral TID Reino Bellis B, NP   100 mg at 11/06/19 2053  . labetalol (NORMODYNE) injection 10 mg  10 mg Intravenous Q10 min PRN Wellington Hampshire, MD      . levothyroxine (SYNTHROID) tablet 50 mcg  50 mcg Oral Q0600 Reino Bellis B, NP      . lisinopril (ZESTRIL) tablet 20 mg  20 mg Oral BID Reino Bellis B, NP      . nitroGLYCERIN (NITROSTAT) SL tablet 0.4 mg  0.4 mg Sublingual Q5 Min x 3 PRN Reino Bellis B, NP      . ondansetron [facility]) injection 4 mg  4 mg Intravenous Q6H PRN Wellington Hampshire, MD   4 mg at 11/06/19 2216  . pantoprazole (PROTONIX) EC tablet 40 mg  40 mg Oral BID Reino Bellis B, NP   40 mg at 11/06/19 2053  . rOPINIRole (REQUIP) tablet 2 mg  2 mg Oral QHS Wellington Hampshire, MD   2 mg at 11/06/19 2149  . sodium chloride flush (NS) 0.9 % injection 3 mL  3 mL Intravenous Q12H Arida, Muhammad A, MD      . sodium chloride flush (  NS) 0.9 % injection 3 mL  3 mL Intravenous PRN Wellington Hampshire, MD      . traMADol  Veatrice Bourbon) tablet 50 mg  50 mg Oral Q6H PRN Cheryln Manly, NP   50 mg at 11/06/19 2053    REVIEW OF SYSTEMS:   [X]  denotes positive finding, [ ]  denotes negative finding Cardiac  Comments:  Chest pain or chest pressure:    Shortness of breath upon exertion:    Short of breath when lying flat:    Irregular heart rhythm:        Vascular    Pain in calf, thigh, or hip brought on by ambulation:    Pain in feet at night that wakes you up from your sleep:     Blood clot in your veins:    Leg swelling:         Pulmonary    Oxygen at home:    Productive cough:     Wheezing:         Neurologic    Sudden weakness in arms or legs:     Sudden numbness in arms or legs:     Sudden onset of difficulty speaking or slurred speech:    Temporary loss of vision in one eye:     Problems with dizziness:         Gastrointestinal    Blood in stool:      Vomited blood:         Genitourinary    Burning when urinating:     Blood in urine:        Psychiatric    Major depression:         Hematologic    Bleeding problems:    Problems with blood clotting too easily:        Skin    Rashes or ulcers:        Constitutional    Fever or chills:     PHYSICAL EXAM:   Vitals:   11/06/19 2300 11/07/19 0000 11/07/19 0043 11/07/19 0132  BP: (!) 84/48 (!) 84/55 (!) 96/47 (!) 123/54  Pulse:    87  Resp: 18 (!) 21 16 16   Temp:    98.5 F (36.9 C)  TempSrc:    Oral  SpO2:    98%  Weight:      Height:        GENERAL: The patient is a well-nourished female, in no acute distress. The vital signs are documented above. CARDIAC: There is a regular rate and rhythm.  VASCULAR: Palpable right femoral pulse with hematoma in the proximal thigh. PULMONARY: Nonlabored respirations ABDOMEN: Soft and non-tender with normal pitched bowel sounds.  MUSCULOSKELETAL: There are no major deformities or cyanosis. NEUROLOGIC: No focal weakness or paresthesias are detected. SKIN: There are no ulcers or rashes  noted. PSYCHIATRIC: The patient has a normal affect.  STUDIES:   I have reviewed her CT angiogram.  This shows no active extravasation however there is a moderate size hematoma in the right thigh  ASSESSMENT and PLAN   Currently, the patient is maintaining a systolic blood pressure in the 90-100 range.  She is somewhat diaphoretic.  I held manual pressure for approximately 20 minutes.  Her CT scan does not show any active extravasation.  Her right thigh is larger than the left with some fullness however there is no compromise of the skin and the area is not tense.  At this time, there is no indication for surgical exploration  as there was no active bleeding and no skin compromise.  I would continue to monitor the patient throughout the night and intermittently hold pressure.  She may benefit from blood transfusion.  I will follow up with the patient in the morning.   Leia Alf, MD, FACS [facility] [phone] Pager [phone]

## 2019-11-07 NOTE — Progress Notes (Signed)
Reviewed most recent CBC.  CBC Latest Ref Rng & Units 11/07/2019 11/07/2019 11/07/2019  WBC 4.0 - 10.5 K/uL - - 11.2(H)  Hemoglobin 12.0 - 15.0 g/dL 7.7(L) 7.8(L) 7.5(L)  Hematocrit 36.0 - 46.0 % 23.3(L) 23.5(L) 23.9(L)  Platelets 150 - 400 K/uL - - 321   1630 H&H essentially unchanged from 1146 am. No transfusion right now. Recheck in am>>ordered.  Rosaria Ferries, PA-C 11/07/2019 7:41 PM Beeper [phone]

## 2019-11-07 NOTE — Progress Notes (Signed)
Received update from nurse regarding Hgb 7.8. This is not significantly increased from Hgb overnight which was 7.5 but I anticipate she might have had an even lower value this morning prior to transfusion. D/w Dr. Audie Box who reviewed with Dr. Fletcher Anon. Since patient is hemodynamically stable and doing better today, hold off on additional transfusion but repeat H/H at 5pm. I will sign out to my colleague who is on call this PM to look out for this value .Regarding urinary retention, we are trying I/O cath for now. The patient told the nurse she has had this issue in the past. Per Dr. Audie Box patient can now begin to get up and move about a little bit which may help. If retention persists we may need to consult urology.  Abdulwahab Demelo PA-C

## 2019-11-07 NOTE — Progress Notes (Addendum)
  Progress Note    11/07/2019 7:29 AM Hospital Day 1  Subjective:  Says her pain right now is not bad but says she is afraid to move as it might get worse.  No pain in the left leg.  RN states that right groin is a tad softer and may be a little bit better.   Afebrile [id number] systolic (54 systolic documented this am, but says unvalidated data) HR 70's-90's NSR 99% RA  Vitals:   11/07/19 0621 11/07/19 0635  BP:  (!) 105/41  Pulse:  80  Resp: 18 17  Temp:  98.8 F (37.1 C)  SpO2:  99%    Physical Exam: General:  No distress Lungs:  Non labored Extremities:  Fullness in right thigh with mild firmness  CBC    Component Value Date/Time   WBC 11.2 (H) 11/07/2019 0005   RBC 2.53 (L) 11/07/2019 0005   HGB 7.5 (L) 11/07/2019 0005   HGB 10.8 (L) 10/22/2019 1020   HCT 23.9 (L) 11/07/2019 0005   HCT 32.7 (L) 10/22/2019 1020   PLT 321 11/07/2019 0005   PLT 403 10/22/2019 1020   MCV 94.5 11/07/2019 0005   MCV 91 10/22/2019 1020   MCH 29.6 11/07/2019 0005   MCHC 31.4 11/07/2019 0005   RDW 13.5 11/07/2019 0005   RDW 12.2 10/22/2019 1020   LYMPHSABS 2.3 08/03/2016 0235   MONOABS 0.8 08/03/2016 0235   EOSABS 0.7 08/03/2016 0235   BASOSABS 0.1 08/03/2016 0235    BMET    Component Value Date/Time   NA 133 (L) 11/07/2019 0005   NA 135 10/22/2019 1020   K 4.0 11/07/2019 0005   CL 110 11/07/2019 0005   CO2 15 (L) 11/07/2019 0005   GLUCOSE 208 (H) 11/07/2019 0005   BUN 17 11/07/2019 0005   BUN 17 10/22/2019 1020   CREATININE 0.96 11/07/2019 0005   CALCIUM 7.2 (L) 11/07/2019 0005   GFRNONAA 57 (L) 11/07/2019 0005   GFRAA >60 11/07/2019 0005    INR    Component Value Date/Time   INR 1.58 07/07/2016 1400     Intake/Output Summary (Last 24 hours) at 11/07/2019 [id number] Last data filed at 11/07/2019 [id number] Gross per 24 hour  Intake 2119 ml  Output -  Net 2119 ml     Assessment/Plan:  78 y.o. female s/p arteriogram with intervention with post right groin hematoma  Hospital Day 1  -right upper thigh has mild firmness but not tense and continues to not have skin compromise.  The right thigh is larger than the left.  RN reports this is slightly improved from last evening.  -systolic blood pressure remains soft and pt has acute blood loss anemia-receiving PRBC's this morning -Dr. Trula Slade to see pt this morning.   Leontine Locket, PA-C [facility] [phone] 11/07/2019 7:29 AM   I agree with the above. I have seen and examined the patient.  Right thigh is improved from last night.  There has been no expansion.  I do not see a repeat Hb.  No surgical indications.  I will sign off.  Please contact me if there are any changes  Wells Torah Pinnock

## 2019-11-07 NOTE — Progress Notes (Signed)
@  1400 bladder scan revealed 836 ml. Upon entering pt room she voided 400. Residual bladder scan showed 497. In/out cath performed with 600 cc of urine. Post cath bladder scan is 0. Pt moved to chair without incident. Will continue to monitor.  Jerald Kief, RN

## 2019-11-07 NOTE — Progress Notes (Signed)
Type & Screen has been ordered. Phlebotomy has attempted to stick her twice. Could not draw labs. Paged attending. Verbal order for RT to get arterial lab draw for STAT type & screen.

## 2019-11-07 NOTE — Progress Notes (Signed)
Progress Note  Patient Name: Katherine Hall Date of Encounter: 11/07/2019  Primary Cardiologist: Mertie Moores, MD  Subjective   Feeling better this morning. Still a little afraid to move leg. No CP, SOB. Blood transfusion infusing.  Inpatient Medications    Scheduled Meds:  clopidogrel  75 mg Oral Daily   escitalopram  10 mg Oral Daily   ferrous sulfate  325 mg Oral Q M,W,F   gabapentin  100 mg Oral TID   levothyroxine  50 mcg Oral Q0600   lisinopril  20 mg Oral BID   pantoprazole  40 mg Oral BID   rOPINIRole  2 mg Oral QHS   sodium chloride flush  3 mL Intravenous Q12H   Continuous Infusions:  sodium chloride     PRN Meds: sodium chloride, acetaminophen, fluticasone, labetalol, nitroGLYCERIN, ondansetron (ZOFRAN) IV, sodium chloride flush, traMADol   Vital Signs    Vitals:   11/07/19 0618 11/07/19 0621 11/07/19 0635 11/07/19 0820  BP:   (!) 105/41 (!) 102/57  Pulse:   80 84  Resp: 14 18 17 18   Temp:   98.8 F (37.1 C) 99 F (37.2 C)  TempSrc:   Oral Tympanic  SpO2:   99% 98%  Weight:      Height:        Intake/Output Summary (Last 24 hours) at 11/07/2019 0844 Last data filed at 11/07/2019 0618 Gross per 24 hour  Intake 2119 ml  Output --  Net 2119 ml   Last 3 Weights 11/06/2019 10/22/2019 09/24/2019  Weight (lbs) 125 lb 131 lb 6.4 oz 127 lb  Weight (kg) 56.7 kg 59.603 kg 57.607 kg     Telemetry    NSR - Personally Reviewed  Physical Exam   GEN: No acute distress.  HEENT: Normocephalic, atraumatic, sclera non-icteric. Neck: No JVD or bruits. Cardiac: RRR no murmurs, rubs, or gallops.  Radials/DP/PT 1+ and equal bilaterally.  Respiratory: Clear to auscultation bilaterally. Breathing is unlabored. GI: Soft, nontender, non-distended, BS +x 4. MS: no deformity. Extremities: No clubbing or cyanosis. No edema. Distal pedal pulses are 2+ and equal bilaterally. Right groin cath site without overt ecchymosis or lump, but is firmer in area of  cath site. No evidence of distal vascular compromise Neuro:  AAOx3. Follows commands. Psych:  Responds to questions appropriately with a normal affect.  Labs    High Sensitivity Troponin:  No results for input(s): TROPONINIHS in the last 720 hours.    Cardiac EnzymesNo results for input(s): TROPONINI in the last 168 hours. No results for input(s): TROPIPOC in the last 168 hours.   Chemistry Recent Labs  Lab 11/06/19 0914 11/07/19 0005  NA  --  133*  K 4.1 4.0  CL  --  110  CO2  --  15*  GLUCOSE  --  208*  BUN  --  17  CREATININE  --  0.96  CALCIUM  --  7.2*  GFRNONAA  --  57*  GFRAA  --  >60  ANIONGAP  --  8     Hematology Recent Labs  Lab 11/06/19 1834 11/07/19 0005  WBC 11.3* 11.2*  RBC 3.02* 2.53*  HGB 9.2* 7.5*  HCT 28.2* 23.9*  MCV 93.4 94.5  MCH 30.5 29.6  MCHC 32.6 31.4  RDW 13.5 13.5  PLT 369 321    BNPNo results for input(s): BNP, PROBNP in the last 168 hours.   DDimer No results for input(s): DDIMER in the last 168 hours.   Radiology  Ct Angio Ao+bifem W & Or Wo Contrast  Result Date: 11/07/2019 CLINICAL DATA:  Recent catheterization with right groin hematoma. Evaluate for active extravasation. EXAM: CT ANGIOGRAPHY OF ABDOMINAL AORTA WITH ILIOFEMORAL RUNOFF TECHNIQUE: Multidetector CT imaging of the abdomen, pelvis and lower extremities was performed using the standard protocol during bolus administration of intravenous contrast. Multiplanar CT image reconstructions and MIPs were obtained to evaluate the vascular anatomy. CONTRAST:  179mL OMNIPAQUE IOHEXOL 350 MG/ML SOLN COMPARISON:  None. FINDINGS: VASCULAR Aorta: Aorta is calcified, nonaneurysmal. Maximum diameter 2.6 cm. No dissection or stenosis. Celiac: Patent without evidence of aneurysm, dissection, vasculitis or significant stenosis. SMA: Patent.  Mild irregularity proximally, likely mild stenosis Renals: Both renal arteries are patent without evidence of aneurysm, dissection, vasculitis,  fibromuscular dysplasia or significant stenosis. IMA: Patent without evidence of aneurysm, dissection, vasculitis or significant stenosis. RIGHT Lower Extremity Inflow: Atherosclerotic irregularity. No significant stenosis. No contrast extravasation from the right common femoral artery or elsewhere in the right groin. Outflow: Moderate disease throughout the superficial femoral artery which is patent. Popliteal artery patent. Runoff: Dominant runoff via the anterior tibial artery which is patent to the ankle. Posterior tibial and peroneal arteries difficult to visualize below the mid calf. LEFT Lower Extremity Inflow: Atherosclerotic irregularity.  No stenosis. Outflow: Moderate disease throughout the superficial femoropopliteal arteries which are patent. Runoff: Dominant runoff via the anterior tibial artery. Peroneal artery is patent into the distal calf. Posterior tibial artery not well visualized below the mid calf. Veins: No obvious venous abnormality within the limitations of this arterial phase study. Review of the MIP images confirms the above findings. NON-VASCULAR Lower chest: Trace bilateral effusions. No confluent opacities. No acute abnormality. Small hiatal hernia. Hepatobiliary: Vicarious excretion of contrast in the gallbladder. Scattered small hypodensities in the liver, likely small cysts. Pancreas: No focal abnormality or ductal dilatation. Spleen: No focal abnormality.  Normal size. Adrenals/Urinary Tract: No hydronephrosis. No renal or adrenal mass. Contrast within the bladder, grossly unremarkable. Stomach/Bowel: Left colonic diverticulosis. No active diverticulitis. Stomach and small bowel decompressed, grossly unremarkable. Lymphatic: No adenopathy Reproductive: No visible mass. Other: No free fluid or free air. Large hematoma noted in the right groin and upper medial right thigh. No contrast extravasation. Musculoskeletal: No acute bony abnormality. Degenerative changes in the lumbar spine.  Prior right knee replacement. IMPRESSION: VASCULAR No evidence of active contrast extravasation in the right groin region. There is a large hematoma noted in the right groin and medial upper right thigh. Diffuse aortoiliac atherosclerosis and atherosclerotic irregularity within the lower extremity vessels as described above. NON-VASCULAR Scattered hepatic cysts. Left colonic diverticulosis. Trace bilateral effusions. Electronically Signed   By: Rolm Baptise M.D.   On: 11/07/2019 01:41   Ct Angio Abd/pel W/ And/or W/o  Result Date: 11/07/2019 CLINICAL DATA:  Recent catheterization with right groin hematoma. Evaluate for active extravasation. EXAM: CT ANGIOGRAPHY OF ABDOMINAL AORTA WITH ILIOFEMORAL RUNOFF TECHNIQUE: Multidetector CT imaging of the abdomen, pelvis and lower extremities was performed using the standard protocol during bolus administration of intravenous contrast. Multiplanar CT image reconstructions and MIPs were obtained to evaluate the vascular anatomy. CONTRAST:  173mL OMNIPAQUE IOHEXOL 350 MG/ML SOLN COMPARISON:  None. FINDINGS: VASCULAR Aorta: Aorta is calcified, nonaneurysmal. Maximum diameter 2.6 cm. No dissection or stenosis. Celiac: Patent without evidence of aneurysm, dissection, vasculitis or significant stenosis. SMA: Patent.  Mild irregularity proximally, likely mild stenosis Renals: Both renal arteries are patent without evidence of aneurysm, dissection, vasculitis, fibromuscular dysplasia or significant stenosis. IMA: Patent without evidence  of aneurysm, dissection, vasculitis or significant stenosis. RIGHT Lower Extremity Inflow: Atherosclerotic irregularity. No significant stenosis. No contrast extravasation from the right common femoral artery or elsewhere in the right groin. Outflow: Moderate disease throughout the superficial femoral artery which is patent. Popliteal artery patent. Runoff: Dominant runoff via the anterior tibial artery which is patent to the ankle. Posterior  tibial and peroneal arteries difficult to visualize below the mid calf. LEFT Lower Extremity Inflow: Atherosclerotic irregularity.  No stenosis. Outflow: Moderate disease throughout the superficial femoropopliteal arteries which are patent. Runoff: Dominant runoff via the anterior tibial artery. Peroneal artery is patent into the distal calf. Posterior tibial artery not well visualized below the mid calf. Veins: No obvious venous abnormality within the limitations of this arterial phase study. Review of the MIP images confirms the above findings. NON-VASCULAR Lower chest: Trace bilateral effusions. No confluent opacities. No acute abnormality. Small hiatal hernia. Hepatobiliary: Vicarious excretion of contrast in the gallbladder. Scattered small hypodensities in the liver, likely small cysts. Pancreas: No focal abnormality or ductal dilatation. Spleen: No focal abnormality.  Normal size. Adrenals/Urinary Tract: No hydronephrosis. No renal or adrenal mass. Contrast within the bladder, grossly unremarkable. Stomach/Bowel: Left colonic diverticulosis. No active diverticulitis. Stomach and small bowel decompressed, grossly unremarkable. Lymphatic: No adenopathy Reproductive: No visible mass. Other: No free fluid or free air. Large hematoma noted in the right groin and upper medial right thigh. No contrast extravasation. Musculoskeletal: No acute bony abnormality. Degenerative changes in the lumbar spine. Prior right knee replacement. IMPRESSION: VASCULAR No evidence of active contrast extravasation in the right groin region. There is a large hematoma noted in the right groin and medial upper right thigh. Diffuse aortoiliac atherosclerosis and atherosclerotic irregularity within the lower extremity vessels as described above. NON-VASCULAR Scattered hepatic cysts. Left colonic diverticulosis. Trace bilateral effusions. Electronically Signed   By: Rolm Baptise M.D.   On: 11/07/2019 01:41    Cardiac Studies   PV Angio  11/06/19 Conclusion  1.  Mild to moderate iliac disease. 2.  Right lower extremity: Mild diffuse SFA and popliteal disease with one-vessel runoff below the knee via the anterior tibial artery. 3.  Left lower extremity: Severe mid SFA stenosis followed by mild to moderate diffuse disease distally and one-vessel runoff below the knee via the anterior tibial artery. 4.  Successful drug-coated balloon angioplasty of the mid left SFA.  Recommendations: Dual antiplatelet therapy for at least 1 month. The patient has diffuse atherosclerosis and requires aggressive treatment of risk factors especially hyperlipidemia.  If not able to tolerate treatment with a statin, consider treatment with a PCSK9 inhibitor.     Patient Profile     78 y.o. female with CAD s/p CABG 2017, HTN, anemia, HLD (intolerant of statins), tobacco abuse who was recently evaluated as outpatient for bilateral claudication (L>R) with abnormal noninvasive testing. She was subsequently brought in for [facility] angio.  Assessment & Plan    1. PAD with lifestyle limiting claudication - PV angio yesterday resulting in drug coated balloon angioplasty of the mid SFA. Procedure complicated by right groin hematoma, ABL anemia and hypotension - vascular team on board. CT without evidence of RPH. Hgb 10.8 (baseline anemic prior to study) -> 9.2 -> 7.5. 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 Will hold lisinopril for now. Appreciate VVS assistance. Will discuss plan for antiplatelet therapy with MD. Given that she was noted to be anemic even before  study, will need OP f/u of this. Follow post-transfusion H/H. She remains on bedrest pending MD assessment.  2. Essential HTN - BPs have been soft context of the above. Hold lisinopril, pending improvement in BP.  3. Hyperlipidemia - patient said to be intolerant to statins. Previously in research study but Dr. Fletcher Anon  was doing to verify with the research team and if she is no longer in the study, should consider PCSK9i or referral to lipid clinic. Last LDL in 2018 was 224.  4. Tobacco abuse - cessation recommended.  5. Hyperglycemia - CBG 208 on overnight labs (with mild pseudohyponatremia), likely related to acute stress of blood loss. Will f/u BMET and A1C in AM. Pre PV angio glucose wnl.  For questions or updates, please contact [place] Please consult [email or web address] for contact info under Cardiology/STEMI.  Signed, Charlie Pitter, PA-C 11/07/2019, 8:44 AM

## 2019-11-08 DIAGNOSIS — Z720 Tobacco use: Secondary | ICD-10-CM

## 2019-11-08 DIAGNOSIS — D62 Acute posthemorrhagic anemia: Secondary | ICD-10-CM

## 2019-11-08 DIAGNOSIS — R7303 Prediabetes: Secondary | ICD-10-CM

## 2019-11-08 DIAGNOSIS — T148XXA Other injury of unspecified body region, initial encounter: Secondary | ICD-10-CM

## 2019-11-08 DIAGNOSIS — Z789 Other specified health status: Secondary | ICD-10-CM

## 2019-11-08 LAB — PREPARE RBC (CROSSMATCH)

## 2019-11-08 LAB — CBC
HCT: 21.3 % — ABNORMAL LOW (ref 36.0–46.0)
Hemoglobin: 7 g/dL — ABNORMAL LOW (ref 12.0–15.0)
MCH: 29.5 pg (ref 26.0–34.0)
MCHC: 32.9 g/dL (ref 30.0–36.0)
MCV: 89.9 fL (ref 80.0–100.0)
Platelets: 250 10*3/uL (ref 150–400)
RBC: 2.37 MIL/uL — ABNORMAL LOW (ref 3.87–5.11)
RDW: 14.6 % (ref 11.5–15.5)
WBC: 7.3 10*3/uL (ref 4.0–10.5)
nRBC: 0 % (ref 0.0–0.2)

## 2019-11-08 LAB — BASIC METABOLIC PANEL
Anion gap: 8 (ref 5–15)
BUN: 9 mg/dL (ref 8–23)
CO2: 24 mmol/L (ref 22–32)
Calcium: 8.6 mg/dL — ABNORMAL LOW (ref 8.9–10.3)
Chloride: 105 mmol/L (ref 98–111)
Creatinine, Ser: 0.67 mg/dL (ref 0.44–1.00)
GFR calc Af Amer: >60 mL/min (ref >60)
GFR calc non Af Amer: >60 mL/min (ref >60)
Glucose, Bld: 113 mg/dL — ABNORMAL HIGH (ref 70–99)
Potassium: 3.6 mmol/L (ref 3.5–5.1)
Sodium: 137 mmol/L (ref 135–145)

## 2019-11-08 LAB — LIPID PANEL
Cholesterol: 220 mg/dL — ABNORMAL HIGH (ref 0–200)
HDL: 48 mg/dL (ref >40)
LDL Cholesterol: 151 mg/dL — ABNORMAL HIGH (ref 0–99)
Total CHOL/HDL Ratio: 4.6 RATIO
Triglycerides: 105 mg/dL (ref <150)
VLDL: 21 mg/dL (ref 0–40)

## 2019-11-08 LAB — HEMOGLOBIN A1C
Hgb A1c MFr Bld: 6.4 % — ABNORMAL HIGH (ref 4.8–5.6)
Mean Plasma Glucose: 136.98 mg/dL

## 2019-11-08 LAB — HEMOGLOBIN AND HEMATOCRIT, BLOOD
HCT: 25.1 % — ABNORMAL LOW (ref 36.0–46.0)
Hemoglobin: 8.6 g/dL — ABNORMAL LOW (ref 12.0–15.0)

## 2019-11-08 MED ORDER — SODIUM CHLORIDE 0.9% IV SOLUTION
Freq: Once | INTRAVENOUS | Status: AC
  Administered 2019-11-08: 09:00:00 via INTRAVENOUS

## 2019-11-08 NOTE — Progress Notes (Addendum)
Progress Note  Patient Name: Katherine Hall Date of Encounter: 11/08/2019  Primary Cardiologist: Mertie Moores, MD  Subjective   Not feeling all that great today. Feels warm overall - had to throw the covers off. Temp 99.1 this AM. Room is quite warm, set at 74 degrees. No chills or cough. Leg still hurts. Has a headache. Urinary retention has resolved.  Inpatient Medications    Scheduled Meds:  clopidogrel  75 mg Oral Daily   escitalopram  10 mg Oral Daily   ferrous sulfate  325 mg Oral Q M,W,F   gabapentin  100 mg Oral TID   levothyroxine  50 mcg Oral Q0600   pantoprazole  40 mg Oral BID   rOPINIRole  2 mg Oral QHS   sodium chloride flush  3 mL Intravenous Q12H   Continuous Infusions:  sodium chloride     PRN Meds: sodium chloride, acetaminophen, fluticasone, labetalol, nitroGLYCERIN, ondansetron (ZOFRAN) IV, sodium chloride flush, traMADol   Vital Signs    Vitals:   11/07/19 1355 11/07/19 1628 11/07/19 2015 11/08/19 0505  BP:  136/61 (!) 116/54 (!) 156/76  Pulse:  89 90   Resp:  18 16 15   Temp:  99.1 F (37.3 C) 98.4 F (36.9 C) 99.1 F (37.3 C)  TempSrc:  Oral Oral Oral  SpO2: 97% 100% 99% 96%  Weight:      Height:        Intake/Output Summary (Last 24 hours) at 11/08/2019 0740 Last data filed at 11/08/2019 0500 Gross per 24 hour  Intake 936.33 ml  Output 3250 ml  Net -2313.67 ml   Last 3 Weights 11/06/2019 10/22/2019 09/24/2019  Weight (lbs) 125 lb 131 lb 6.4 oz 127 lb  Weight (kg) 56.7 kg 59.603 kg 57.607 kg     Telemetry    NSR, very brief run SVT - Personally Reviewed  Physical Exam   GEN: No acute distress.  HEENT: Normocephalic, atraumatic, sclera non-icteric. Neck: No JVD or bruits. Cardiac: RRR no murmurs, rubs, or gallops.  Radials/DP/PT 1+ and equal bilaterally.  Respiratory: Clear to auscultation bilaterally. Breathing is unlabored. GI: Soft, nontender, non-distended, BS +x 4. MS: no deformity. Extremities: No clubbing or  cyanosis. No edema. Distal pedal pulses are 2+ and equal bilaterally. Right groin cath site without overt ecchymosis or lump but is firmer in area of cath site and down thigh. Palpation feels similar to yesterday. Neuro:  AAOx3. Follows commands. Psych:  Responds to questions appropriately with a normal affect.  Labs    High Sensitivity Troponin:  No results for input(s): TROPONINIHS in the last 720 hours.    Cardiac EnzymesNo results for input(s): TROPONINI in the last 168 hours. No results for input(s): TROPIPOC in the last 168 hours.   Chemistry Recent Labs  Lab 11/06/19 0914 11/07/19 0005 11/08/19 0404  NA  --  133* 137  K 4.1 4.0 3.6  CL  --  110 105  CO2  --  15* 24  GLUCOSE  --  208* 113*  BUN  --  17 9  CREATININE  --  0.96 0.67  CALCIUM  --  7.2* 8.6*  GFRNONAA  --  57* >60  GFRAA  --  >60 >60  ANIONGAP  --  8 8     Hematology Recent Labs  Lab 11/06/19 1834 11/07/19 0005 11/07/19 1146 11/07/19 1630 11/08/19 0404  WBC 11.3* 11.2*  --   --  7.3  RBC 3.02* 2.53*  --   --  2.37*  HGB 9.2* 7.5* 7.8* 7.7* 7.0*  HCT 28.2* 23.9* 23.5* 23.3* 21.3*  MCV 93.4 94.5  --   --  89.9  MCH 30.5 29.6  --   --  29.5  MCHC 32.6 31.4  --   --  32.9  RDW 13.5 13.5  --   --  14.6  PLT 369 321  --   --  250    BNPNo results for input(s): BNP, PROBNP in the last 168 hours.   DDimer No results for input(s): DDIMER in the last 168 hours.   Radiology    Ct Angio Ao+bifem W & Or Wo Contrast  Result Date: 11/07/2019 CLINICAL DATA:  Recent catheterization with right groin hematoma. Evaluate for active extravasation. EXAM: CT ANGIOGRAPHY OF ABDOMINAL AORTA WITH ILIOFEMORAL RUNOFF TECHNIQUE: Multidetector CT imaging of the abdomen, pelvis and lower extremities was performed using the standard protocol during bolus administration of intravenous contrast. Multiplanar CT image reconstructions and MIPs were obtained to evaluate the vascular anatomy. CONTRAST:  175mL OMNIPAQUE IOHEXOL 350  MG/ML SOLN COMPARISON:  None. FINDINGS: VASCULAR Aorta: Aorta is calcified, nonaneurysmal. Maximum diameter 2.6 cm. No dissection or stenosis. Celiac: Patent without evidence of aneurysm, dissection, vasculitis or significant stenosis. SMA: Patent.  Mild irregularity proximally, likely mild stenosis Renals: Both renal arteries are patent without evidence of aneurysm, dissection, vasculitis, fibromuscular dysplasia or significant stenosis. IMA: Patent without evidence of aneurysm, dissection, vasculitis or significant stenosis. RIGHT Lower Extremity Inflow: Atherosclerotic irregularity. No significant stenosis. No contrast extravasation from the right common femoral artery or elsewhere in the right groin. Outflow: Moderate disease throughout the superficial femoral artery which is patent. Popliteal artery patent. Runoff: Dominant runoff via the anterior tibial artery which is patent to the ankle. Posterior tibial and peroneal arteries difficult to visualize below the mid calf. LEFT Lower Extremity Inflow: Atherosclerotic irregularity.  No stenosis. Outflow: Moderate disease throughout the superficial femoropopliteal arteries which are patent. Runoff: Dominant runoff via the anterior tibial artery. Peroneal artery is patent into the distal calf. Posterior tibial artery not well visualized below the mid calf. Veins: No obvious venous abnormality within the limitations of this arterial phase study. Review of the MIP images confirms the above findings. NON-VASCULAR Lower chest: Trace bilateral effusions. No confluent opacities. No acute abnormality. Small hiatal hernia. Hepatobiliary: Vicarious excretion of contrast in the gallbladder. Scattered small hypodensities in the liver, likely small cysts. Pancreas: No focal abnormality or ductal dilatation. Spleen: No focal abnormality.  Normal size. Adrenals/Urinary Tract: No hydronephrosis. No renal or adrenal mass. Contrast within the bladder, grossly unremarkable.  Stomach/Bowel: Left colonic diverticulosis. No active diverticulitis. Stomach and small bowel decompressed, grossly unremarkable. Lymphatic: No adenopathy Reproductive: No visible mass. Other: No free fluid or free air. Large hematoma noted in the right groin and upper medial right thigh. No contrast extravasation. Musculoskeletal: No acute bony abnormality. Degenerative changes in the lumbar spine. Prior right knee replacement. IMPRESSION: VASCULAR No evidence of active contrast extravasation in the right groin region. There is a large hematoma noted in the right groin and medial upper right thigh. Diffuse aortoiliac atherosclerosis and atherosclerotic irregularity within the lower extremity vessels as described above. NON-VASCULAR Scattered hepatic cysts. Left colonic diverticulosis. Trace bilateral effusions. Electronically Signed   By: Rolm Baptise M.D.   On: 11/07/2019 01:41   Ct Angio Abd/pel W/ And/or W/o  Result Date: 11/07/2019 CLINICAL DATA:  Recent catheterization with right groin hematoma. Evaluate for active extravasation. EXAM: CT ANGIOGRAPHY OF ABDOMINAL AORTA WITH ILIOFEMORAL RUNOFF TECHNIQUE: Multidetector  CT imaging of the abdomen, pelvis and lower extremities was performed using the standard protocol during bolus administration of intravenous contrast. Multiplanar CT image reconstructions and MIPs were obtained to evaluate the vascular anatomy. CONTRAST:  112mL OMNIPAQUE IOHEXOL 350 MG/ML SOLN COMPARISON:  None. FINDINGS: VASCULAR Aorta: Aorta is calcified, nonaneurysmal. Maximum diameter 2.6 cm. No dissection or stenosis. Celiac: Patent without evidence of aneurysm, dissection, vasculitis or significant stenosis. SMA: Patent.  Mild irregularity proximally, likely mild stenosis Renals: Both renal arteries are patent without evidence of aneurysm, dissection, vasculitis, fibromuscular dysplasia or significant stenosis. IMA: Patent without evidence of aneurysm, dissection, vasculitis or  significant stenosis. RIGHT Lower Extremity Inflow: Atherosclerotic irregularity. No significant stenosis. No contrast extravasation from the right common femoral artery or elsewhere in the right groin. Outflow: Moderate disease throughout the superficial femoral artery which is patent. Popliteal artery patent. Runoff: Dominant runoff via the anterior tibial artery which is patent to the ankle. Posterior tibial and peroneal arteries difficult to visualize below the mid calf. LEFT Lower Extremity Inflow: Atherosclerotic irregularity.  No stenosis. Outflow: Moderate disease throughout the superficial femoropopliteal arteries which are patent. Runoff: Dominant runoff via the anterior tibial artery. Peroneal artery is patent into the distal calf. Posterior tibial artery not well visualized below the mid calf. Veins: No obvious venous abnormality within the limitations of this arterial phase study. Review of the MIP images confirms the above findings. NON-VASCULAR Lower chest: Trace bilateral effusions. No confluent opacities. No acute abnormality. Small hiatal hernia. Hepatobiliary: Vicarious excretion of contrast in the gallbladder. Scattered small hypodensities in the liver, likely small cysts. Pancreas: No focal abnormality or ductal dilatation. Spleen: No focal abnormality.  Normal size. Adrenals/Urinary Tract: No hydronephrosis. No renal or adrenal mass. Contrast within the bladder, grossly unremarkable. Stomach/Bowel: Left colonic diverticulosis. No active diverticulitis. Stomach and small bowel decompressed, grossly unremarkable. Lymphatic: No adenopathy Reproductive: No visible mass. Other: No free fluid or free air. Large hematoma noted in the right groin and upper medial right thigh. No contrast extravasation. Musculoskeletal: No acute bony abnormality. Degenerative changes in the lumbar spine. Prior right knee replacement. IMPRESSION: VASCULAR No evidence of active contrast extravasation in the right groin  region. There is a large hematoma noted in the right groin and medial upper right thigh. Diffuse aortoiliac atherosclerosis and atherosclerotic irregularity within the lower extremity vessels as described above. NON-VASCULAR Scattered hepatic cysts. Left colonic diverticulosis. Trace bilateral effusions. Electronically Signed   By: Rolm Baptise M.D.   On: 11/07/2019 01:41    Cardiac Studies   PV Angio 11/06/19 Conclusion  1. Mild to moderate iliac disease. 2. Right lower extremity: Mild diffuse SFA and popliteal disease with one-vessel runoff below the knee via the anterior tibial artery. 3. Left lower extremity: Severe mid SFA stenosis followed by mild to moderate diffuse disease distally and one-vessel runoff below the knee via the anterior tibial artery. 4. Successful drug-coated balloon angioplasty of the mid left SFA.  Recommendations: Dual antiplatelet therapy for at least 1 month. The patient has diffuse atherosclerosis and requires aggressive treatment of risk factors especially hyperlipidemia. If not able to tolerate treatment with a statin, consider treatment with a PCSK9 inhibitor.    Patient Profile     78 y.o. female with CAD s/p CABG 2017, HTN, anemia, HLD (intolerant of statins), tobacco abuse who was recently evaluated as outpatient for bilateral claudication (L>R) with abnormal noninvasive testing. She was subsequently brought in for [facility] angio. Post-procedure course complicated by right groin hematoma and ABL anemia.  Assessment & Plan    1. PAD with lifestyle limiting claudication - PV angio 12/3 resulting in drug coated balloon angioplasty of the mid SFA. Now off aspirin due to ABL anemia. She will remain on Plavix for now.  2. Post-procedure right groin hematoma, ABL anemia and hypotension - vascular team consulted and did not feel any further intervention was necessary. CT without evidence of RPH. Her baseline Hgb prior to procedure as 10.8. 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 Case d/w Dr. [place] and Dr. Fletcher Anon. Plan to transfuse 1 additional unit. Discussed with patient and she is agreeable. She reports feeling warm this AM but no overt fever and room temp was set at 74. Will continue to monitor. WBC normal.  3. Urinary retention  - s/p I/O cath. Pt reports this has been intermittent issue in the past. She has since been able to urinate on her own. Consider outpatient urology f/u.  4. Essential HTN - last BP was creeping up. Lisinopril was held due to hypotension related to anemia. Will follow this AM and if stable consider resuming ACEi later today.  5. Hyperlipidemia - patient said to be intolerant to statins. Previously in research study but Dr. Fletcher Anon was doing to verify with the research team and if she is no longer in the study, should consider PCSK9i or referral to lipid clinic. Last LDL in 2018 was 224.  6. Tobacco abuse - cessation recommended.  7. Hyperglycemia - f/u A1C c/w pre-diabetes - 6.4. Recommend OP primary care follow-up.  8. Brief run of SVT on telemetry - very brief, no symptoms. Monitor for recurrence.  For questions or updates, please contact [place] Please consult [email or web address] for contact info under Cardiology/STEMI.  Signed, Charlie Pitter, PA-C 11/08/2019, 7:40 AM

## 2019-11-09 ENCOUNTER — Other Ambulatory Visit: Payer: Self-pay | Admitting: Student

## 2019-11-09 DIAGNOSIS — I739 Peripheral vascular disease, unspecified: Secondary | ICD-10-CM

## 2019-11-09 DIAGNOSIS — R339 Retention of urine, unspecified: Secondary | ICD-10-CM

## 2019-11-09 LAB — BPAM RBC
Blood Product Expiration Date: 202012212359
Blood Product Expiration Date: 202012302359
ISSUE DATE / TIME: 202012030617
ISSUE DATE / TIME: 202012040912
Unit Type and Rh: 6200
Unit Type and Rh: 6200

## 2019-11-09 LAB — TYPE AND SCREEN
ABO/RH(D): A POS
Antibody Screen: NEGATIVE
Unit division: 0
Unit division: 0

## 2019-11-09 LAB — CBC WITH DIFFERENTIAL/PLATELET
Abs Immature Granulocytes: 0.04 10*3/uL (ref 0.00–0.07)
Basophils Absolute: 0.1 10*3/uL (ref 0.0–0.1)
Basophils Relative: 1 %
Eosinophils Absolute: 0.5 10*3/uL (ref 0.0–0.5)
Eosinophils Relative: 6 %
HCT: 26.8 % — ABNORMAL LOW (ref 36.0–46.0)
Hemoglobin: 8.9 g/dL — ABNORMAL LOW (ref 12.0–15.0)
Immature Granulocytes: 1 %
Lymphocytes Relative: 24 %
Lymphs Abs: 2 10*3/uL (ref 0.7–4.0)
MCH: 29.5 pg (ref 26.0–34.0)
MCHC: 33.2 g/dL (ref 30.0–36.0)
MCV: 88.7 fL (ref 80.0–100.0)
Monocytes Absolute: 0.9 10*3/uL (ref 0.1–1.0)
Monocytes Relative: 11 %
Neutro Abs: 4.8 10*3/uL (ref 1.7–7.7)
Neutrophils Relative %: 57 %
Platelets: 254 10*3/uL (ref 150–400)
RBC: 3.02 MIL/uL — ABNORMAL LOW (ref 3.87–5.11)
RDW: 14.4 % (ref 11.5–15.5)
WBC: 8.3 10*3/uL (ref 4.0–10.5)
nRBC: 0.2 % (ref 0.0–0.2)

## 2019-11-09 MED ORDER — LISINOPRIL 10 MG PO TABS
20.0000 mg | ORAL_TABLET | Freq: Two times a day (BID) | ORAL | Status: DC
  Administered 2019-11-09: 20 mg via ORAL
  Filled 2019-11-09: qty 2

## 2019-11-09 MED ORDER — LISINOPRIL 20 MG PO TABS: 20.0000 mg | ORAL_TABLET | Freq: Two times a day (BID) | ORAL | 2 refills | Status: DC

## 2019-11-09 MED ORDER — NITROGLYCERIN 0.4 MG SL SUBL: 0.4000 mg | SUBLINGUAL_TABLET | SUBLINGUAL | 1 refills | Status: DC | PRN

## 2019-11-09 NOTE — Plan of Care (Signed)

## 2019-11-09 NOTE — Progress Notes (Signed)
Discharge instructions given to Katherine Hall.  Discussed new medications, medication changes and side effects to watch for.  Discussed follow up appointments and signs and symptoms to watch for and when to contact the physician.  Discussed activities and lifting restrictions.  Verbalized understanding.

## 2019-11-09 NOTE — Progress Notes (Signed)
Progress Note  Patient Name: Katherine Hall Date of Encounter: 11/09/2019  Primary Cardiologist:   Mertie Moores, MD   Subjective   She is ready to go home.  She thinks that her leg looks stable and she has not had chest pain.  No SOB.   Inpatient Medications    Scheduled Meds: . clopidogrel  75 mg Oral Daily  . escitalopram  10 mg Oral Daily  . ferrous sulfate  325 mg Oral Q M,W,F  . gabapentin  100 mg Oral TID  . levothyroxine  50 mcg Oral Q0600  . pantoprazole  40 mg Oral BID  . rOPINIRole  2 mg Oral QHS  . sodium chloride flush  3 mL Intravenous Q12H   Continuous Infusions: . sodium chloride     PRN Meds: sodium chloride, acetaminophen, fluticasone, labetalol, nitroGLYCERIN, ondansetron (ZOFRAN) IV, sodium chloride flush, traMADol   Vital Signs    Vitals:   11/08/19 1225 11/08/19 2007 11/09/19 0541 11/09/19 0900  BP: (!) 125/52 (!) 155/61 (!) 157/70 (!) 175/60  Pulse:  82    Resp: 14 18 19 18   Temp: 99 F (37.2 C) 98.8 F (37.1 C) 98.9 F (37.2 C) 98.4 F (36.9 C)  TempSrc: Oral Oral Oral Oral  SpO2: 99% 98% 98% 97%  Weight:      Height:        Intake/Output Summary (Last 24 hours) at 11/09/2019 1009 Last data filed at 11/09/2019 0900 Gross per 24 hour  Intake 1086 ml  Output 3050 ml  Net -1964 ml   Filed Weights   11/06/19 0842  Weight: 56.7 kg    Telemetry    NSR - Personally Reviewed  ECG    NA - Personally Reviewed  Physical Exam   GEN: No acute distress.   Neck: No  JVD Cardiac: RRR, no murmurs, rubs, or gallops.  Respiratory: Clear  to auscultation bilaterally. GI: Soft, nontender, non-distended  MS: No  Edema;  Right femoral hematoma somewhat soft.  Mild echymosis.  Mildly tender.  No pulsatile mass.  Neuro:  Nonfocal  Psych: Normal affect   Labs    Chemistry Recent Labs  Lab 11/06/19 0914 11/07/19 0005 11/08/19 0404  NA  --  133* 137  K 4.1 4.0 3.6  CL  --  110 105  CO2  --  15* 24  GLUCOSE  --  208* 113*  BUN  --   17 9  CREATININE  --  0.96 0.67  CALCIUM  --  7.2* 8.6*  GFRNONAA  --  57* >60  GFRAA  --  >60 >60  ANIONGAP  --  8 8     Hematology Recent Labs  Lab 11/07/19 0005  11/08/19 0404 11/08/19 1446 11/09/19 0238  WBC 11.2*  --  7.3  --  8.3  RBC 2.53*  --  2.37*  --  3.02*  HGB 7.5*   < > 7.0* 8.6* 8.9*  HCT 23.9*   < > 21.3* 25.1* 26.8*  MCV 94.5  --  89.9  --  88.7  MCH 29.6  --  29.5  --  29.5  MCHC 31.4  --  32.9  --  33.2  RDW 13.5  --  14.6  --  14.4  PLT 321  --  250  --  254   < > = values in this interval not displayed.    Cardiac EnzymesNo results for input(s): TROPONINI in the last 168 hours. No results for input(s): TROPIPOC in the  last 168 hours.   BNPNo results for input(s): BNP, PROBNP in the last 168 hours.   DDimer No results for input(s): DDIMER in the last 168 hours.   Radiology    No results found.  Cardiac Studies   PV STUDY 1.  Mild to moderate iliac disease. 2.  Right lower extremity: Mild diffuse SFA and popliteal disease with one-vessel runoff below the knee via the anterior tibial artery. 3.  Left lower extremity: Severe mid SFA stenosis followed by mild to moderate diffuse disease distally and one-vessel runoff below the knee via the anterior tibial artery. 4.  Successful drug-coated balloon angioplasty of the mid left SFA.  Patient Profile     78 y.o. female with CAD s/p CABG 2017, HTN, anemia, HLD (intolerant of statins), tobacco abuse who was recently evaluated as outpatient for bilateral claudication (L>R) with abnormal noninvasive testing. She was subsequently brought in for [facility] angio. Post-procedure course complicated by right groin hematoma and ABL.  She required a second unit of blood yesterday.    Assessment & Plan    PAD with lifestyle limiting claudication -   OK to go home after she ambulates in the hallway if there are no issues.  She will need a TOC appt in the office next week.    Post-procedure right groin hematoma, ABL  anemia and hypotension -    Groin is stable.   Hgb up and stable  (8.6, 8.9).   Feels OK and is anxious to go home.    Urinary retention  -     No further urinary retention.  Essential HTN -    BP is up.  Will restart lisinopril at previous dose and she can follow her BP at home.   Check BMET next week in the office.   Hyperlipidemia -    Dr. Fletcher Anon to consider referral to [facility].   Tobacco abuse -  She has been educated.   Hyperglycemia -    A1c 6.4.  No change in therapy.   Brief run of SVT on telemetry -    No further arrhythmias.   For questions or updates, please contact [place] Please consult [email or web address] for contact info under Cardiology/STEMI.   Signed, Minus Breeding, MD  11/09/2019, 10:09 AM

## 2019-11-09 NOTE — Discharge Summary (Addendum)
Discharge Summary    Patient ID: Katherine Hall MRN: [id number]; DOB: 21-Oct-1941  Admit date: 11/06/2019 Discharge date: 11/09/2019  Primary Care Provider: [name], MD  Primary Cardiologist: Mertie Moores, MD  Primary Electrophysiologist:  None   Discharge Diagnoses    Principal Problem:   PAD (peripheral artery disease) [facility]) Active Problems:   Hematoma   Acute blood loss anemia   HTN (hypertension)   Hyperlipidemia   Statin intolerance   Tobacco abuse   Pre-diabetes   Urinary retention    Diagnostic Studies/Procedures    Abdominal Aortogram with Lower Extremity Angiography 11/06/2019: 1. Mild to moderate iliac disease.  2. Right lower extremity: Mild diffuse SFA and popliteal disease with one-vessel runoff below the knee via the anterior tibial artery.  3. Left lower extremity: Severe mid SFA stenosis followed by mild to moderate diffuse disease distally and one-vessel runoff below the knee via the anterior tibial artery.  4. Successful drug-coated balloon angioplasty of the mid left SFA.    Recommendations:  Dual antiplatelet therapy for at least 1 month.  The patient has diffuse atherosclerosis and requires aggressive treatment of risk factors especially hyperlipidemia. If not able to tolerate treatment with a statin, consider treatment with a PCSK9 inhibitor.  _____________   History of Present Illness     Katherine Hall is a 78 year old female with a history of CAD s/p CABG in 2017, PAD, hypertension, hyperlipidemia intolerant to statins, anemia, and tobacco use who is followed by Dr. Acie Fredrickson for her general cardiac care and Dr. Fletcher Anon for her PAD.   She was evaluated by Dr. Rachell Cipro at [facility] in 2019 for possible claudication. At that time, ABI was normal. The patient recently complained of bilateral calf discomfort with walking especially on the left side and thus she was referred for repeat vascular studies which showed an ABI of 0.96 on the right  and 0.85 on the left. The patient mainly describes left calf discomfort after walking about 100 feet. This is mostly noticeable with incline walking.     Initially she thought some of her symptoms might related to chronic lower back pain with radiation to her legs. She had a procedure done for that with no significant improvement. She reports severe left calf which has been severely lifestyle limiting. She has no rest pain or lower extremity ulceration. She underwent lower extremity arterial duplex which confirmed severe mid left SFA stenosis.   She was seen by Dr. Fletcher Anon on 10/22/2019 for follow-up of PAD and decision was made to proceed with angiography and possible endovascular intervention.   Hospital Course     Consultants: Vascular Surgery  PAD s/p Intervention to Left SFA Complicated by Right Groin Hematoma with Acute Blood Loss Anemia Patient presented on 11/06/2019 for planned bilateral lower extremity angiography as stated above. Found to have mild to moderate iliac disease, mild diffuse right SFA and popliteal disease with one-vessel runoff below the knee via the anterior tibial artery, and severe mid left SFA stenosis followed by mild to moderate diffuse disease distally and one-vessel runoff below the knee via the anterior tibial artery. Patient underwent successful drug-coated balloon angioplasty of the mid left SFA. Following procedure, patient developed hematoma at right femoral cath site. She had a drop in her BP and was treated with IV fluids. Femstop was placed. Vascular surgery was consulted but did not feel like there was any indication for surgical exploration as there was no active bleeding and no skin compromise. Hemoglobin dropped from 9.5  on presentation to 7.2 on morning of 11/07/2019 so transfusion was ordered. Patient received another transfusion of PRBCs on 11/08/2019. Hemoglobin stable at 8.9 today. - Will discharge patient on Plavix 75mg  daily. Per Dr. Percival Spanish, will  hold Aspirin for now given acute anemia. - Will also need repeat lower extremity doppler with ABI of left leg in about 1 week as well. - Will also need TOC follow-up next week to recheck groin. Will need repeat CBC at that time.   Non-Sustained VT  Patient had very brief run of non-sustained VT on 11/08/2019. Asymptomatic with this. No recurrence.   Hypertension  BP elevated today. Home Lisinopril was initially held due to hypotension related to anemia. Will restart home Lisinopril 20mg  twice daily at discharge. - Will need BMET checked at [facility] visit.    Hyperlipidemia  Lipid panel from this admission showed  Patient said to be intolerant to statins. Previously in research study but Dr. Fletcher Anon was going to verify with the research team and if she is no longer in the study, should consider PCSK9i or referral to lipid clinic.    Hyperglycemia  Hemoglobin A1c 6.4 consistent with pre-diabetes.  - Recommend outpatient follow-up with PCP.    Tobacco Abuse  Educated on importance of complete cessation.  Urinary Retention  Patient has had some urinary retention during admission. Treated with in and out cath. She has since been able to urinate on her own. She report this has been intermittent issue in the past. Consider outpatient Urology follow-up.   Patient seen and examined by Dr. Percival Spanish and determined to be stable for discharge. Will send schedulers a message to call patient and set up TOC follow-up in 1 week. Will need repeat CBC and BMET at that time. Medications as below.   Did the patient have an acute coronary syndrome (MI, NSTEMI, STEMI, etc) this admission?:  No                               Did the patient have a percutaneous coronary intervention (stent / angioplasty)?:  No.   _____________  Discharge Vitals Blood pressure (!) 175/60, pulse 82, temperature 98.4 F (36.9 C), temperature source Oral, resp. rate 18, height 4\' 10"  (1.473 m), weight 56.7 kg, SpO2 97 %.  Filed  Weights   11/06/19 0842  Weight: 56.7 kg    Labs & Radiologic Studies    CBC Recent Labs    11/08/19 0404 11/08/19 1446 11/09/19 0238  WBC 7.3  --  8.3  NEUTROABS  --   --  4.8  HGB 7.0* 8.6* 8.9*  HCT 21.3* 25.1* 26.8*  MCV 89.9  --  88.7  PLT 250  --  0000000   Basic Metabolic Panel Recent Labs    11/07/19 0005 11/08/19 0404  NA 133* 137  K 4.0 3.6  CL 110 105  CO2 15* 24  GLUCOSE 208* 113*  BUN 17 9  CREATININE 0.96 0.67  CALCIUM 7.2* 8.6*   Liver Function Tests No results for input(s): AST, ALT, ALKPHOS, BILITOT, PROT, ALBUMIN in the last 72 hours. No results for input(s): LIPASE, AMYLASE in the last 72 hours. High Sensitivity Troponin:   No results for input(s): TROPONINIHS in the last 720 hours.  BNP Invalid input(s): POCBNP D-Dimer No results for input(s): DDIMER in the last 72 hours. Hemoglobin A1C Recent Labs    11/08/19 0404  HGBA1C 6.4*   Fasting Lipid Panel Recent Labs  11/08/19 0404  CHOL 220*  HDL 48  LDLCALC 151*  TRIG 105  CHOLHDL 4.6   Thyroid Function Tests No results for input(s): TSH, T4TOTAL, T3FREE, THYROIDAB in the last 72 hours.  Invalid input(s): FREET3 _____________  Ct Angio Ao+bifem W & Or Wo Contrast  Result Date: 11/07/2019 CLINICAL DATA:  Recent catheterization with right groin hematoma. Evaluate for active extravasation. EXAM: CT ANGIOGRAPHY OF ABDOMINAL AORTA WITH ILIOFEMORAL RUNOFF TECHNIQUE: Multidetector CT imaging of the abdomen, pelvis and lower extremities was performed using the standard protocol during bolus administration of intravenous contrast. Multiplanar CT image reconstructions and MIPs were obtained to evaluate the vascular anatomy. CONTRAST:  156mL OMNIPAQUE IOHEXOL 350 MG/ML SOLN COMPARISON:  None. FINDINGS: VASCULAR Aorta: Aorta is calcified, nonaneurysmal. Maximum diameter 2.6 cm. No dissection or stenosis. Celiac: Patent without evidence of aneurysm, dissection, vasculitis or significant stenosis.  SMA: Patent.  Mild irregularity proximally, likely mild stenosis Renals: Both renal arteries are patent without evidence of aneurysm, dissection, vasculitis, fibromuscular dysplasia or significant stenosis. IMA: Patent without evidence of aneurysm, dissection, vasculitis or significant stenosis. RIGHT Lower Extremity Inflow: Atherosclerotic irregularity. No significant stenosis. No contrast extravasation from the right common femoral artery or elsewhere in the right groin. Outflow: Moderate disease throughout the superficial femoral artery which is patent. Popliteal artery patent. Runoff: Dominant runoff via the anterior tibial artery which is patent to the ankle. Posterior tibial and peroneal arteries difficult to visualize below the mid calf. LEFT Lower Extremity Inflow: Atherosclerotic irregularity.  No stenosis. Outflow: Moderate disease throughout the superficial femoropopliteal arteries which are patent. Runoff: Dominant runoff via the anterior tibial artery. Peroneal artery is patent into the distal calf. Posterior tibial artery not well visualized below the mid calf. Veins: No obvious venous abnormality within the limitations of this arterial phase study. Review of the MIP images confirms the above findings. NON-VASCULAR Lower chest: Trace bilateral effusions. No confluent opacities. No acute abnormality. Small hiatal hernia. Hepatobiliary: Vicarious excretion of contrast in the gallbladder. Scattered small hypodensities in the liver, likely small cysts. Pancreas: No focal abnormality or ductal dilatation. Spleen: No focal abnormality.  Normal size. Adrenals/Urinary Tract: No hydronephrosis. No renal or adrenal mass. Contrast within the bladder, grossly unremarkable. Stomach/Bowel: Left colonic diverticulosis. No active diverticulitis. Stomach and small bowel decompressed, grossly unremarkable. Lymphatic: No adenopathy Reproductive: No visible mass. Other: No free fluid or free air. Large hematoma noted in  the right groin and upper medial right thigh. No contrast extravasation. Musculoskeletal: No acute bony abnormality. Degenerative changes in the lumbar spine. Prior right knee replacement. IMPRESSION: VASCULAR No evidence of active contrast extravasation in the right groin region. There is a large hematoma noted in the right groin and medial upper right thigh. Diffuse aortoiliac atherosclerosis and atherosclerotic irregularity within the lower extremity vessels as described above. NON-VASCULAR Scattered hepatic cysts. Left colonic diverticulosis. Trace bilateral effusions. Electronically Signed   By: Rolm Baptise M.D.   On: 11/07/2019 01:41   Ct Angio Abd/pel W/ And/or W/o  Result Date: 11/07/2019 CLINICAL DATA:  Recent catheterization with right groin hematoma. Evaluate for active extravasation. EXAM: CT ANGIOGRAPHY OF ABDOMINAL AORTA WITH ILIOFEMORAL RUNOFF TECHNIQUE: Multidetector CT imaging of the abdomen, pelvis and lower extremities was performed using the standard protocol during bolus administration of intravenous contrast. Multiplanar CT image reconstructions and MIPs were obtained to evaluate the vascular anatomy. CONTRAST:  155mL OMNIPAQUE IOHEXOL 350 MG/ML SOLN COMPARISON:  None. FINDINGS: VASCULAR Aorta: Aorta is calcified, nonaneurysmal. Maximum diameter 2.6 cm. No dissection or stenosis.  Celiac: Patent without evidence of aneurysm, dissection, vasculitis or significant stenosis. SMA: Patent.  Mild irregularity proximally, likely mild stenosis Renals: Both renal arteries are patent without evidence of aneurysm, dissection, vasculitis, fibromuscular dysplasia or significant stenosis. IMA: Patent without evidence of aneurysm, dissection, vasculitis or significant stenosis. RIGHT Lower Extremity Inflow: Atherosclerotic irregularity. No significant stenosis. No contrast extravasation from the right common femoral artery or elsewhere in the right groin. Outflow: Moderate disease throughout the  superficial femoral artery which is patent. Popliteal artery patent. Runoff: Dominant runoff via the anterior tibial artery which is patent to the ankle. Posterior tibial and peroneal arteries difficult to visualize below the mid calf. LEFT Lower Extremity Inflow: Atherosclerotic irregularity.  No stenosis. Outflow: Moderate disease throughout the superficial femoropopliteal arteries which are patent. Runoff: Dominant runoff via the anterior tibial artery. Peroneal artery is patent into the distal calf. Posterior tibial artery not well visualized below the mid calf. Veins: No obvious venous abnormality within the limitations of this arterial phase study. Review of the MIP images confirms the above findings. NON-VASCULAR Lower chest: Trace bilateral effusions. No confluent opacities. No acute abnormality. Small hiatal hernia. Hepatobiliary: Vicarious excretion of contrast in the gallbladder. Scattered small hypodensities in the liver, likely small cysts. Pancreas: No focal abnormality or ductal dilatation. Spleen: No focal abnormality.  Normal size. Adrenals/Urinary Tract: No hydronephrosis. No renal or adrenal mass. Contrast within the bladder, grossly unremarkable. Stomach/Bowel: Left colonic diverticulosis. No active diverticulitis. Stomach and small bowel decompressed, grossly unremarkable. Lymphatic: No adenopathy Reproductive: No visible mass. Other: No free fluid or free air. Large hematoma noted in the right groin and upper medial right thigh. No contrast extravasation. Musculoskeletal: No acute bony abnormality. Degenerative changes in the lumbar spine. Prior right knee replacement. IMPRESSION: VASCULAR No evidence of active contrast extravasation in the right groin region. There is a large hematoma noted in the right groin and medial upper right thigh. Diffuse aortoiliac atherosclerosis and atherosclerotic irregularity within the lower extremity vessels as described above. NON-VASCULAR Scattered hepatic  cysts. Left colonic diverticulosis. Trace bilateral effusions. Electronically Signed   By: Rolm Baptise M.D.   On: 11/07/2019 01:41   Disposition   Patient is being discharged home today in good condition.  Follow-up Plans & Appointments     Discharge Instructions    Call MD for:  redness, tenderness, or signs of infection (pain, swelling, redness, odor or green/yellow discharge around incision site)   Complete by: As directed    Diet - low sodium heart healthy   Complete by: As directed    Increase activity slowly   Complete by: As directed       Discharge Medications   Allergies as of 11/09/2019      Reactions   Penicillins Anaphylaxis   Did it involve swelling of the face/tongue/throat, SOB, or low BP? Yes Did it involve sudden or severe rash/hives, skin peeling, or any reaction on the inside of your mouth or nose? No Did you need to seek medical attention at a hospital or doctor's office? Yes When did it last happen?10+ years If all above answers are NO, may proceed with cephalosporin use.   Lincomycin Swelling, Other (See Comments)   passed out   Naproxen Sodium Other (See Comments)   Stomach ulcers   Statins Other (See Comments)   Myalgias   Latex Other (See Comments)   Blisters when in contact with skin   Tape    blistering      Medication List  STOP taking these medications   aspirin EC 81 MG tablet     TAKE these medications   acetaminophen 650 MG CR tablet Commonly known as: TYLENOL Take 1,300 mg by mouth daily.   acyclovir 400 MG tablet Commonly known as: ZOVIRAX Take 800 mg by mouth daily as needed (fever blisters).   alendronate 70 MG tablet Commonly known as: FOSAMAX Take 70 mg by mouth every Saturday.   clopidogrel 75 MG tablet Commonly known as: Plavix Take 1 tablet (75 mg total) by mouth daily.   diphenhydramine-acetaminophen 25-500 MG Tabs tablet Commonly known as: TYLENOL PM Take 2 tablets by mouth at bedtime as needed  (sleep).   escitalopram 10 MG tablet Commonly known as: LEXAPRO Take 10 mg by mouth daily.   ferrous sulfate 325 (65 FE) MG tablet Take 325 mg by mouth every Monday, Wednesday, and Friday.   fluticasone 50 MCG/ACT nasal spray Commonly known as: FLONASE Place 2 sprays into both nostrils daily as needed for allergies.   gabapentin 100 MG capsule Commonly known as: NEURONTIN Take 100 mg by mouth 3 (three) times daily.   Icy Hot 5 % Ptch Generic drug: Menthol Apply 1 patch topically daily as needed (pain).   levothyroxine 50 MCG tablet Commonly known as: SYNTHROID Take 50 mcg by mouth daily.   lisinopril 20 MG tablet Commonly known as: ZESTRIL Take 1 tablet (20 mg total) by mouth 2 (two) times daily. What changed:   medication strength  Another medication with the same name was removed. Continue taking this medication, and follow the directions you see here.   nitroGLYCERIN 0.4 MG SL tablet Commonly known as: NITROSTAT Place 1 tablet (0.4 mg total) under the tongue every 5 (five) minutes x 3 doses as needed for chest pain.   pantoprazole 40 MG tablet Commonly known as: PROTONIX Take 40 mg by mouth 2 (two) times daily.   rOPINIRole 2 MG tablet Commonly known as: REQUIP Take 2 mg by mouth at bedtime.   traMADol 50 MG tablet Commonly known as: ULTRAM Take 1 tablet (50 mg total) by mouth every 6 (six) hours as needed for severe pain.          Outstanding Labs/Studies   Needs repeat CBC and BMET at follow-up visit.  Duration of Discharge Encounter   Greater than 30 minutes including physician time.  Signed, Darreld Mclean, PA-C 11/09/2019, 11:44 AM  Patient seen and examined.  Plan as discussed in my rounding note for today and outlined above. Jeneen Rinks [facility]  11/09/2019  12:10 PM

## 2019-11-13 ENCOUNTER — Telehealth: Payer: Self-pay | Admitting: Cardiovascular Disease

## 2019-11-13 NOTE — Telephone Encounter (Signed)
-----   Message from Cain Sieve, RN sent at 11/11/2019  7:06 AM EST ----- Regarding: FW: TOC Follow-Up and Lower Extremity Dopplers/ABI  ----- Message ----- From: Darreld Mclean, PA-C Sent: 11/09/2019  11:40 AM EST To: Cv Div Nl Toc, Cv Div Ch St Toc Subject: TOC Follow-Up and Lower Extremity Dopplers/A#  Patient being discharged today following lower extremity intervention for PAD with Dr. Fletcher Anon. She needs TOC follow-up with Dr. Fletcher Anon next week. If unable to see Dr. Fletcher Anon, [place] to schedule with one of [facility]. Needs to be an in-person visit (NOT virtual) because we need to recheck cath site and repeat labs. Also needs follow-up lower extremity dopplers and ABI in about 1 week. Orders have been placed.  Can you please call patient to schedule these?  Thank you!

## 2019-11-13 NOTE — Telephone Encounter (Signed)
Patient contacted regarding discharge from 11/06/2019 - 11/09/2019 (3 days)  [facility]  Patient understands to follow up with provider with Fabian Sharp 12/17 at 11:30  at [facility]. Patient understands discharge instructions? YES Patient understands medications and regiment? YES Patient understands to bring all medications to this visit? YES STILL IN PAIN BUT THIS IS STILL "TIGHT" AT THE GROIN AREA. TAKING PLAVIX. NO PAIN IN LEFT LEG A LITTLE IN THE RIGHT. SHE WILL ARRIVE EARLY FOR APPT WEARING A MASK.

## 2019-11-13 NOTE — Telephone Encounter (Signed)
Patient has TOC appt on 12/17 at 11:30 with Fabian Sharp, requested by Sande Rives.

## 2019-11-13 NOTE — Telephone Encounter (Signed)
Called patient to schedule TOC appt offered appt on 12/15 with Dr. Fletcher Anon, patient was unable to accept that appt, she stated her husband is having surgery that day.

## 2019-11-15 ENCOUNTER — Ambulatory Visit (HOSPITAL_COMMUNITY)
Admission: RE | Admit: 2019-11-15 | Payer: Medicare Other | Source: Ambulatory Visit | Attending: Student | Admitting: Student

## 2019-11-18 ENCOUNTER — Other Ambulatory Visit (HOSPITAL_COMMUNITY): Payer: Self-pay | Admitting: Cardiovascular Disease

## 2019-11-18 ENCOUNTER — Other Ambulatory Visit: Payer: Self-pay

## 2019-11-18 ENCOUNTER — Ambulatory Visit (HOSPITAL_COMMUNITY)
Admission: RE | Admit: 2019-11-18 | Discharge: 2019-11-18 | Disposition: A | Discharge status: Home / Self Care | Payer: Medicare Other | Source: Ambulatory Visit | Attending: Cardiovascular Disease | Admitting: Cardiovascular Disease

## 2019-11-18 DIAGNOSIS — I739 Peripheral vascular disease, unspecified: Secondary | ICD-10-CM | POA: Diagnosis present

## 2019-11-20 ENCOUNTER — Telehealth: Payer: Self-pay

## 2019-11-20 NOTE — Telephone Encounter (Signed)
Left message for pt explaining her appointment originally scheduled with Fabian Sharp, [place] tomorrow, 12/17 has been rescheduled to Monday, 12/21 with Jory Sims, NP. Informed this is due to Fabian Sharp, PA will not be in the office tomorrow and asked that she call our office back if her rescheduled appt date and time does not work for her.

## 2019-11-21 ENCOUNTER — Telehealth: Payer: Self-pay

## 2019-11-21 ENCOUNTER — Encounter (HOSPITAL_COMMUNITY): Payer: Medicare Other

## 2019-11-21 ENCOUNTER — Ambulatory Visit: Payer: Medicare Other | Admitting: Physician Assistant

## 2019-11-21 NOTE — Telephone Encounter (Signed)
Patient returned call for test results.  °

## 2019-11-21 NOTE — Telephone Encounter (Signed)
Gave pt results. Denied hematoma growth. States it is still painful at times. Verbalized understanding.

## 2019-11-21 NOTE — Telephone Encounter (Signed)
-----   Message from Darreld Mclean, [place] sent at 11/21/2019  7:42 AM EST ----- Repeat ultrasounds of legs look ok following recent procedure with Dr. Fletcher Anon. Hematoma (collection of blood outside the blood vessel) still present in right groin. Can we just make sure patient is not having any pain in her right groin and make sure patient does not feel like hematoma is getting bigger? She should keep follow-up appointment on 11/25/2019.  Thank you!

## 2019-11-21 NOTE — Telephone Encounter (Signed)
Left message for pt to call back for doppler results.

## 2019-11-24 NOTE — Progress Notes (Signed)
Cardiology Office Note   Date:  11/25/2019   ID:  Katherine Hall, Katherine Hall Apr 05, 1941, MRN [id number]  PCP:  [name], MD  Cardiologist: Dr. Acie Fredrickson CC:  Hospital Follow Up   History of Present Illness: Katherine Hall is a 78 y.o. female who presents for posthospitalization TOC follow-up after intervention in the setting of PAD.  She had bilateral lower extremity angiography and found to have mild to moderate iliac disease, mild diffuse right SFA, and popliteal disease with one-vessel runoff below the knee via anterior tibial artery and severe mid left FSA stenosis, followed by moderate diffuse disease distally with one-vessel runoff below the knee via the anterior tibial artery.  Katherine Hall underwent successful DES angioplasty of the mid left FSA on 11/06/2019, unfortunately she did develop a hematoma at the right femoral cath site.  She also had hypotension.  Hemoglobin dropped from 9.5 on presentation to 7.2 on the morning of 01/07/2019 and she did receive transfusion x2.    On discharge the patient was started on lisinopril 20 mg daily as this was placed on hold during hospitalization due to hypotension.  She is to have a repeat ABI of the left leg, repeat CBC on this visit.  She was noted to have a brief run of nonsustained VT on 11/08/2019 and was asymptomatic with this and therefore no further treatment was planned.  Repeat ABI on 11/18/2019 revealed improved resting left ABI which revealed mild left lower extremity arterial disease, improved from prior study on 09/24/2019, right leg revealed mild lower extremity arterial disease.  She was noted to continue to have a hematoma still present in the right groin.  We will reassess at this office visit.  Katherine Hall also has a history of hypertension, hyperlipidemia, statin intolerance, and prediabetes.  She comes today feeling much better, her left leg is stronger and she is able to walk without pain.  Right leg hematoma pain has improved  significantly with warm moist compresses.  She is hypertensive today.  She states that she is normally much lower at home.  She denies any significant chest pressure, dyspnea, or fatigue.  Past Medical History:  Diagnosis Date  . Anemia   . Arthritis   . Gastric ulcer    a. h/o bleeding ulcer 2009 requiring 3 pints of blood.  [name] History of removal of cyst    a. 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  [name] HLD (hyperlipidemia)    a. h/o intolerance of statins, prev on Repatha but insurance stopped covering.  [name] HTN (hypertension)     Past Surgical History:  Procedure Laterality Date  . ABDOMINAL AORTOGRAM W/LOWER EXTREMITY Bilateral 11/06/2019   Procedure: ABDOMINAL AORTOGRAM W/LOWER EXTREMITY;  Surgeon: Wellington Hampshire, MD;  Location: [place] [facility];  Service: Cardiovascular;  Laterality: Bilateral;  . ABDOMINAL HYSTERECTOMY    . APPENDECTOMY    . CARDIAC CATHETERIZATION N/A 07/05/2016   Procedure: Left Heart Cath and Coronary Angiography;  Surgeon: Burnell Blanks, MD;  Location: [place] [facility];  Service: Cardiovascular;  Laterality: N/A;  . CARDIAC SURGERY     cyst, not on heart.  . CORONARY ARTERY BYPASS GRAFT N/A 07/07/2016   Procedure: CORONARY ARTERY BYPASS GRAFTING (CABG) x 5 with endoscopic harvesting of the right greater saphenous vein;  Surgeon: Gaye Pollack, MD;  Location: [place] [facility];  Service: Open Heart Surgery;  Laterality: N/A;  . HEMORRHOID SURGERY    . INTRAOPERATIVE TRANSESOPHAGEAL ECHOCARDIOGRAM N/A 07/07/2016  Procedure: INTRAOPERATIVE TRANSESOPHAGEAL ECHOCARDIOGRAM;  Surgeon: Gaye Pollack, MD;  Location: [facility];  Service: Open Heart Surgery;  Laterality: N/A;  . PERIPHERAL VASCULAR BALLOON ANGIOPLASTY  11/06/2019   Procedure: PERIPHERAL VASCULAR BALLOON ANGIOPLASTY;  Surgeon: Wellington Hampshire, MD;  Location: [place] [facility];  Service: Cardiovascular;;  cutting and dcb  . SHOULDER SURGERY       Current Outpatient Medications    Medication Sig Dispense Refill  . acetaminophen (TYLENOL) 650 MG CR tablet Take 1,300 mg by mouth daily.    [name] acyclovir (ZOVIRAX) 400 MG tablet Take 800 mg by mouth daily as needed (fever blisters).     [name] alendronate (FOSAMAX) 70 MG tablet Take 70 mg by mouth every Saturday.     . clopidogrel (PLAVIX) 75 MG tablet Take 1 tablet (75 mg total) by mouth daily. 30 tablet 3  . diphenhydramine-acetaminophen (TYLENOL PM) 25-500 MG TABS tablet Take 2 tablets by mouth at bedtime as needed (sleep).    [name] escitalopram (LEXAPRO) 10 MG tablet Take 10 mg by mouth daily.     . ferrous sulfate 325 (65 FE) MG tablet Take 325 mg by mouth every Monday, Wednesday, and Friday.    . fluticasone (FLONASE) 50 MCG/ACT nasal spray Place 2 sprays into both nostrils daily as needed for allergies.     [name] gabapentin (NEURONTIN) 100 MG capsule Take 100 mg by mouth 3 (three) times daily.     [name] levothyroxine (SYNTHROID, LEVOTHROID) 50 MCG tablet Take 50 mcg by mouth daily.    [name] lisinopril (ZESTRIL) 20 MG tablet Take 1 tablet (20 mg total) by mouth 2 (two) times daily. 60 tablet 2  . Menthol (ICY HOT) 5 % PTCH Apply 1 patch topically daily as needed (pain).    . nitroGLYCERIN (NITROSTAT) 0.4 MG SL tablet Place 1 tablet (0.4 mg total) under the tongue every 5 (five) minutes x 3 doses as needed for chest pain. 25 tablet 1  . pantoprazole (PROTONIX) 40 MG tablet Take 40 mg by mouth 2 (two) times daily.     [name] rOPINIRole (REQUIP) 2 MG tablet Take 2 mg by mouth at bedtime.    . traMADol (ULTRAM) 50 MG tablet Take 1 tablet (50 mg total) by mouth every 6 (six) hours as needed for severe pain. 28 tablet 0   No current facility-administered medications for this visit.    Allergies:   Penicillins, Lincomycin, Naproxen sodium, Statins, Latex, and Tape    Social History:  The patient  reports that she has never smoked. She has never used smokeless tobacco. She reports that she does not drink alcohol or use drugs.   Family History:  The  patient's family history includes CAD in her father; Coronary artery disease in her brother and sister; Stroke in her mother.    ROS: All other systems are reviewed and negative. Unless otherwise mentioned in H&P    PHYSICAL EXAM: VS:  BP (!) 153/81   Pulse 69   Temp (!) 97.1 F (36.2 C)   Ht 4\' 10"  (1.473 m)   Wt 131 lb (59.4 kg)   SpO2 99%   BMI 27.38 kg/m  , BMI Body mass index is 27.38 kg/m. GEN: Well nourished, well developed, in no acute distress HEENT: normal Neck: no JVD, left carotid bruits, no masses Cardiac: RRR; no murmurs, rubs, or gallops,no edema she does have bilateral femoral artery bruits although not harsh.   Respiratory:  Clear to auscultation bilaterally, normal work of breathing GI: soft,  nontender, nondistended, + BS MS: no deformity or atrophy, right thigh hematoma is noted, no significant skin discoloration, some firmness is noted on the medial lateral upper thigh, not painful to palpation.  Some old bruising is noted. Skin: warm and dry, no rash Neuro:  Strength and sensation are intact Psych: euthymic mood, full affect   EKG: Not completed during this office visit.  Recent Labs: 11/08/2019: BUN 9; Creatinine, Ser 0.67; Potassium 3.6; Sodium 137 11/09/2019: Hemoglobin 8.9; Platelets 254    Lipid Panel    Component Value Date/Time   CHOL 220 (H) 11/08/2019 0404   CHOL 319 (H) 07/26/2017 1006   TRIG 105 11/08/2019 0404   HDL 48 11/08/2019 0404   HDL 61 07/26/2017 1006   CHOLHDL 4.6 11/08/2019 0404   VLDL 21 11/08/2019 0404   LDLCALC 151 (H) 11/08/2019 0404   LDLCALC 224 (H) 07/26/2017 1006      Wt Readings from Last 3 Encounters:  11/25/19 131 lb (59.4 kg)  11/06/19 125 lb (56.7 kg)  10/22/19 131 lb 6.4 oz (59.6 kg)      Other studies Reviewed: Peripheral Vascular Catheterization 11/06/2019 1.  Mild to moderate iliac disease. 2.  Right lower extremity: Mild diffuse SFA and popliteal disease with one-vessel runoff below the knee via  the anterior tibial artery. 3.  Left lower extremity: Severe mid SFA stenosis followed by mild to moderate diffuse disease distally and one-vessel runoff below the knee via the anterior tibial artery. 4.  Successful drug-coated balloon angioplasty of the mid left SFA.   ABI post procedure 11/18/2019 ABI Findings: +---------+------------------+-----+--------+--------+ Right    Rt Pressure (mmHg)IndexWaveformComment  +---------+------------------+-----+--------+--------+ Brachial 144                                     +---------+------------------+-----+--------+--------+ ATA      124               0.84 biphasic         +---------+------------------+-----+--------+--------+ PTA      129               0.88 biphasic         +---------+------------------+-----+--------+--------+ PERO     125               0.85 biphasic         +---------+------------------+-----+--------+--------+ Great Toe72                0.49 Abnormal         +---------+------------------+-----+--------+--------+  +---------+------------------+-----+--------+-------+ Left     Lt Pressure (mmHg)IndexWaveformComment +---------+------------------+-----+--------+-------+ Brachial 147                                    +---------+------------------+-----+--------+-------+ ATA      132               0.90 biphasic        +---------+------------------+-----+--------+-------+ PTA      117               0.80 biphasic        +---------+------------------+-----+--------+-------+ PERO     136               0.93 biphasic        +---------+------------------+-----+--------+-------+ Netta Neat  0.54 Abnormal        +---------+------------------+-----+--------+-------+  Right ABIs appear essentially unchanged compared to prior study on 09/2019. Left ABIs appear increased compared to prior study on 09/2019.   Summary: Right: Resting right  ankle-brachial index indicates mild right lower extremity arterial disease. The right toe-brachial index is abnormal.  Left: Resting left ankle-brachial index indicates mild left lower extremity arterial disease. The left toe-brachial index is abnormal.  ASSESSMENT AND PLAN:  1.  Peripheral arterial disease: Status post DES angioplasty of the mid left FSA on 11/06/2019.  She is feeling much better and able to bear weight on the left and walk without pain.  Follow-up ABIs revealed left leg improved.  She is to continue current medication regimen to include clopidogrel 75 mg daily  2.  Right leg hematoma: Improved with less pain.  Still feel firmness on the medial lateral portion of the right upper thigh with some mild skin discoloration.  There is no harsh bruit but I do auscultate bilateral femoral artery bruits, which are soft.  Likely related to iliac artery stenosis radiation.  She continues to take tramadol for pain and use warm moist compresses.  She states it is greatly improved and she does not feel any pain unless pushing hard on the residual hematoma.  3.  Hypertension: Elevated today.  I rechecked it in the clinic and it remained elevated.  I noted that they took her off of the lisinopril due to hypotension during hospitalization likely related to acute blood loss anemia.  May need to consider restarting lisinopril on follow-up appointment next week should it remain elevated.  She states that it is normally lower at home.  4.  Hyperlipidemia: She is intolerant to statins.  May need to consider adding Zetia or consider her for PCSK9 inhibition on follow-up with known history of peripheral arterial disease.  Most recent LDL 151 on 11/08/2019.  Should be less than 70.  Recommend referral to pharmacy for PCSK9 inhibition evaluation.  Can be discussed further with Dr. Fletcher Anon on follow-up on December 09, 2018.  Current medicines are reviewed at length with the patient today.   Labs/ tests ordered  today include: None   Phill Myron. West Pugh, ANP, AACC   11/25/2019 3:27 PM    [address] Office [phone] Fax [phone]  Notice: This dictation was prepared with Dragon dictation along with smaller phrase technology. Any transcriptional errors that result from this process are unintentional and may not be corrected upon review.

## 2019-11-25 ENCOUNTER — Other Ambulatory Visit: Payer: Self-pay

## 2019-11-25 ENCOUNTER — Ambulatory Visit: Payer: Medicare Other | Admitting: Adult Health

## 2019-11-25 ENCOUNTER — Encounter: Payer: Self-pay | Admitting: Adult Health

## 2019-11-25 VITALS — BP 153/81 | HR 69 | Temp 97.1°F | Ht <= 58 in | Wt 131.0 lb

## 2019-11-25 DIAGNOSIS — I10 Essential (primary) hypertension: Secondary | ICD-10-CM | POA: Diagnosis not present

## 2019-11-25 DIAGNOSIS — D62 Acute posthemorrhagic anemia: Secondary | ICD-10-CM | POA: Diagnosis not present

## 2019-11-25 DIAGNOSIS — I739 Peripheral vascular disease, unspecified: Secondary | ICD-10-CM

## 2019-11-25 DIAGNOSIS — E785 Hyperlipidemia, unspecified: Secondary | ICD-10-CM | POA: Diagnosis not present

## 2019-11-25 NOTE — Patient Instructions (Addendum)
Medication Instructions:  Continue current medications  If you need a refill on your cardiac medications before your next appointment, please call your pharmacy.  Labwork: None Ordered   Testing/Procedures: None Ordered  PLEASE READ AND FOLLOW SALTY 6 ATTACHED  Reduce your risk of getting COVID-19 With your heart disease it is especially important for people at increased risk of severe illness from COVID-19, and those who live with them, to protect themselves from getting COVID-19. The best way to protect yourself and to help reduce the spread of the virus that causes COVID-19 is to: [name] Limit your interactions with other people as much as possible. . Take precautions to prevent getting COVID-19 when you do interact with others. If you start feeling sick and think you may have COVID-19, get in touch with your healthcare provider within 24 hours.  Follow-Up: IN 3 months Please call our office 2 months in advance,  to schedule this  appointment. In Person Mertie Moores, MD.    Keep appointment with Dr Fletcher Anon in January  At [facility], you and your health needs are our priority.  As part of our continuing mission to provide you with exceptional heart care, we have created designated Provider Care Teams.  These Care Teams include your primary Cardiologist (physician) and Advanced Practice Providers (APPs -  Physician Assistants and Nurse Practitioners) who all work together to provide you with the care you need, when you need it.  Thank you for choosing [facility] at [facility]!!     Happy Holidays!!

## 2019-12-10 ENCOUNTER — Encounter: Payer: Self-pay | Admitting: Cardiovascular Disease

## 2019-12-10 ENCOUNTER — Ambulatory Visit: Payer: Medicare Other | Admitting: Cardiovascular Disease

## 2019-12-10 ENCOUNTER — Other Ambulatory Visit: Payer: Self-pay

## 2019-12-10 VITALS — BP 140/76 | HR 78 | Temp 97.3°F | Ht <= 58 in | Wt 129.8 lb

## 2019-12-10 DIAGNOSIS — E785 Hyperlipidemia, unspecified: Secondary | ICD-10-CM

## 2019-12-10 DIAGNOSIS — I10 Essential (primary) hypertension: Secondary | ICD-10-CM

## 2019-12-10 DIAGNOSIS — I739 Peripheral vascular disease, unspecified: Secondary | ICD-10-CM

## 2019-12-10 DIAGNOSIS — I2510 Atherosclerotic heart disease of native coronary artery without angina pectoris: Secondary | ICD-10-CM | POA: Diagnosis not present

## 2019-12-10 NOTE — Progress Notes (Signed)
Cardiology Office Note   Date:  12/10/2019   ID:  Katherine Hall, DOB Feb 12, 1941, [name]  PCP:  Katherine Cliche, MD  Cardiologist: Dr. Acie Hall  No chief complaint on file.     History of Present Illness: Katherine Hall is a 79 y.o. female who is here today for follow-up regarding peripheral arterial disease.   She has known history of coronary artery disease status post CABG in 2017, essential hypertension, anemia and hyperlipidemia with intolerance to statins.  She is not diabetic and is a lifelong non-smoker. The patient was seen recently for severe left calf claudication.  Vascular studies showed an ABI of 0.96 on the right and 0.85 on the left with evidence of severe left SFA stenosis. I proceeded with angiography on December 2 which showed mild to moderate iliac disease, on the right side, there was mild diffuse SFA and popliteal disease with one-vessel runoff below the knee.  On the left side there was severe mid SFA stenosis and one-vessel runoff below the knee.  I performed successful drug-coated balloon angioplasty to the mid left SFA.  Unfortunately, the procedure was complicated by a large groin hematoma that required transfusion.  This has almost completely healed.  She reports complete resolution of left leg claudication.  She is doing much better.   Past Medical History:  Diagnosis Date  . Anemia   . Arthritis   . Gastric ulcer    a. h/o bleeding ulcer 2009 requiring 3 pints of blood.  [name] History of removal of cyst    a. 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  [name] HLD (hyperlipidemia)    a. h/o intolerance of statins, prev on Repatha but insurance stopped covering.  [name] HTN (hypertension)     Past Surgical History:  Procedure Laterality Date  . ABDOMINAL AORTOGRAM W/LOWER EXTREMITY Bilateral 11/06/2019   Procedure: ABDOMINAL AORTOGRAM W/LOWER EXTREMITY;  Surgeon: Katherine Hampshire, MD;  Location: [place] [facility];  Service:  Cardiovascular;  Laterality: Bilateral;  . ABDOMINAL HYSTERECTOMY    . APPENDECTOMY    . CARDIAC CATHETERIZATION N/A 07/05/2016   Procedure: Left Heart Cath and Coronary Angiography;  Surgeon: Katherine Blanks, MD;  Location: [place] [facility];  Service: Cardiovascular;  Laterality: N/A;  . CARDIAC SURGERY     cyst, not on heart.  . CORONARY ARTERY BYPASS GRAFT N/A 07/07/2016   Procedure: CORONARY ARTERY BYPASS GRAFTING (CABG) x 5 with endoscopic harvesting of the right greater saphenous vein;  Surgeon: Katherine Pollack, MD;  Location: [place] [facility];  Service: Open Heart Surgery;  Laterality: N/A;  . HEMORRHOID SURGERY    . INTRAOPERATIVE TRANSESOPHAGEAL ECHOCARDIOGRAM N/A 07/07/2016   Procedure: INTRAOPERATIVE TRANSESOPHAGEAL ECHOCARDIOGRAM;  Surgeon: Katherine Pollack, MD;  Location: [facility];  Service: Open Heart Surgery;  Laterality: N/A;  . PERIPHERAL VASCULAR BALLOON ANGIOPLASTY  11/06/2019   Procedure: PERIPHERAL VASCULAR BALLOON ANGIOPLASTY;  Surgeon: Katherine Hampshire, MD;  Location: [place] [facility];  Service: Cardiovascular;;  cutting and dcb  . SHOULDER SURGERY       Current Outpatient Medications  Medication Sig Dispense Refill  . acetaminophen (TYLENOL) 650 MG CR tablet Take 1,300 mg by mouth daily.    [name] acyclovir (ZOVIRAX) 400 MG tablet Take 800 mg by mouth daily as needed (fever blisters).     [name] alendronate (FOSAMAX) 70 MG tablet Take 70 mg by mouth every Saturday.     . Calcium Carbonate-Vitamin D 600-200 MG-UNIT TABS Take  by mouth.    . clopidogrel (PLAVIX) 75 MG tablet Take 1 tablet (75 mg total) by mouth daily. 30 tablet 3  . diphenhydramine-acetaminophen (TYLENOL PM) 25-500 MG TABS tablet Take 2 tablets by mouth at bedtime as needed (sleep).    [name] escitalopram (LEXAPRO) 10 MG tablet Take 10 mg by mouth daily.     . ferrous sulfate 325 (65 FE) MG tablet Take 325 mg by mouth every Monday, Wednesday, and Friday.    . fluticasone (FLONASE) 50 MCG/ACT nasal spray Place 2 sprays into both  nostrils daily as needed for allergies.     [name] gabapentin (NEURONTIN) 100 MG capsule Take 100 mg by mouth 3 (three) times daily.     [name] levothyroxine (SYNTHROID, LEVOTHROID) 50 MCG tablet Take 50 mcg by mouth daily.    [name] lisinopril (ZESTRIL) 20 MG tablet Take 1 tablet (20 mg total) by mouth 2 (two) times daily. 60 tablet 2  . Magnesium (V-R MAGNESIUM) 250 MG TABS Take by mouth.    . Menthol (ICY HOT) 5 % PTCH Apply 1 patch topically daily as needed (pain).    . nitroGLYCERIN (NITROSTAT) 0.4 MG SL tablet Place 1 tablet (0.4 mg total) under the tongue every 5 (five) minutes x 3 doses as needed for chest pain. 25 tablet 1  . pantoprazole (PROTONIX) 40 MG tablet Take 40 mg by mouth 2 (two) times daily.     . polyethylene glycol powder (GLYCOLAX/MIRALAX) 17 GM/SCOOP powder Take by mouth.    [name] rOPINIRole (REQUIP) 2 MG tablet Take 2 mg by mouth at bedtime.    . traMADol (ULTRAM) 50 MG tablet Take 1 tablet (50 mg total) by mouth every 6 (six) hours as needed for severe pain. 28 tablet 0   No current facility-administered medications for this visit.    Allergies:   Penicillins, Lincomycin, Naproxen sodium, Statins, Latex, and Tape    Social History:  The patient  reports that she has never smoked. She has never used smokeless tobacco. She reports that she does not drink alcohol or use drugs.   Family History:  The patient's family history includes CAD in her father; Coronary artery disease in her brother and sister; Stroke in her mother.    ROS:  Please see the history of present illness.   Otherwise, review of systems are positive for none.   All other systems are reviewed and negative.    PHYSICAL EXAM: VS:  BP 140/76   Pulse 78   Temp (!) 97.3 F (36.3 C)   Ht 4\' 10"  (1.473 m)   Wt 129 lb 12.8 oz (58.9 kg)   SpO2 99%   BMI 27.13 kg/m  , BMI Body mass index is 27.13 kg/m. GEN: Well nourished, well developed, in no acute distress  HEENT: normal  Neck: no JVD, carotid bruits, or  masses Cardiac: RRR; no murmurs, rubs, or gallops,no edema  Respiratory:  clear to auscultation bilaterally, normal work of breathing GI: soft, nontender, nondistended, + BS MS: no deformity or atrophy  Skin: warm and dry, no rash Neuro:  Strength and sensation are intact Psych: euthymic mood, full affect Vascular: Femoral pulses slightly diminished bilaterally.  Distal pulses are palpable but faint. No significant right groin hematoma although the right thigh continues to be slightly bigger than the left.   EKG:  EKG is not ordered today.    Recent Labs: 11/08/2019: BUN 9; Creatinine, Ser 0.67; Potassium 3.6; Sodium 137 11/09/2019: Hemoglobin 8.9; Platelets 254    Lipid  Panel    Component Value Date/Time   CHOL 220 (H) 11/08/2019 0404   CHOL 319 (H) 07/26/2017 1006   TRIG 105 11/08/2019 0404   HDL 48 11/08/2019 0404   HDL 61 07/26/2017 1006   CHOLHDL 4.6 11/08/2019 0404   VLDL 21 11/08/2019 0404   LDLCALC 151 (H) 11/08/2019 0404   LDLCALC 224 (H) 07/26/2017 1006      Wt Readings from Last 3 Encounters:  12/10/19 129 lb 12.8 oz (58.9 kg)  11/25/19 131 lb (59.4 kg)  11/06/19 125 lb (56.7 kg)       No flowsheet data found.    ASSESSMENT AND PLAN:  1.  Peripheral arterial disease: Status post recent successful left SFA drug-coated balloon angioplasty for severe claudication.  Claudication resolved with improvement in ABI to normal.  She is currently on Plavix without aspirin.  Right groin hematoma is almost completely healed.  She does have a faint bruit in the right common femoral artery but I suspect that is due to her PAD.  There is no evidence of pseudoaneurysm.    2.  Coronary artery disease involving native coronary arteries: Status post CABG in 2017 with no recurrent angina since then.  3.  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  She is intolerant to statins and is currently enrolled in a research study.  She has a  follow-up appointment there in February to discuss treatment.  In the past, she stopped the medication due to left leg pain which was likely due to claudication and not related to the medication.   4.  Essential hypertension: Blood pressure is reasonably controlled.      Disposition:   FU with me in 6 months  Signed,  [name], MD  12/10/2019 9:29 AM    [facility]

## 2019-12-10 NOTE — Patient Instructions (Signed)
Medication Instructions:  Your physician recommends that you continue on your current medications as directed. Please refer to the Current Medication list given to you today.  *If you need a refill on your cardiac medications before your next appointment, please call your pharmacy*  Lab Work: NONE  If you have labs (blood work) drawn today and your tests are completely normal, you will receive your results only by: [name] MyChart Message (if you have MyChart) OR . A paper copy in the mail If you have any lab test that is abnormal or we need to change your treatment, we will call you to review the results.  Testing/Procedures: NONE  Follow-Up: At [facility], you and your health needs are our priority.  As part of our continuing mission to provide you with exceptional heart care, we have created designated Provider Care Teams.  These Care Teams include your primary Cardiologist (physician) and Advanced Practice Providers (APPs -  Physician Assistants and Nurse Practitioners) who all work together to provide you with the care you need, when you need it.  Your next appointment:   6 month(s)  You will receive a reminder letter in the mail two months in advance. If you don't receive a letter, please call our office to schedule the follow-up appointment.  The format for your next appointment:   In Person  Provider:   DR Fletcher Anon

## 2019-12-30 ENCOUNTER — Telehealth: Payer: Self-pay | Admitting: *Deleted

## 2019-12-30 DIAGNOSIS — Z006 Encounter for examination for normal comparison and control in clinical research program: Secondary | ICD-10-CM

## 2019-12-30 NOTE — Telephone Encounter (Signed)
Chart check done for visit T13M33 in the [employer] research study.

## 2019-12-31 ENCOUNTER — Ambulatory Visit: Payer: Medicare Other | Admitting: Cardiovascular Disease

## 2020-01-29 ENCOUNTER — Ambulatory Visit: Payer: Medicare Other | Admitting: Cardiovascular Disease

## 2020-02-05 ENCOUNTER — Other Ambulatory Visit: Payer: Self-pay | Admitting: *Deleted

## 2020-02-05 MED ORDER — CLOPIDOGREL BISULFATE 75 MG PO TABS: 75.0000 mg | ORAL_TABLET | Freq: Every day | ORAL | 3 refills | Status: DC

## 2020-02-05 NOTE — Telephone Encounter (Signed)
Rx has been sent to the pharmacy electronically. ° °

## 2020-02-27 ENCOUNTER — Ambulatory Visit: Payer: Medicare Other | Admitting: Cardiovascular Disease

## 2020-03-12 ENCOUNTER — Ambulatory Visit: Payer: Medicare Other | Admitting: Cardiovascular Disease

## 2020-03-12 ENCOUNTER — Other Ambulatory Visit: Payer: Self-pay

## 2020-03-12 ENCOUNTER — Encounter: Payer: Self-pay | Admitting: Cardiovascular Disease

## 2020-03-12 ENCOUNTER — Telehealth: Payer: Self-pay | Admitting: Pharmacist

## 2020-03-12 VITALS — BP 130/82 | HR 82 | Ht <= 58 in | Wt 128.8 lb

## 2020-03-12 DIAGNOSIS — I10 Essential (primary) hypertension: Secondary | ICD-10-CM | POA: Diagnosis not present

## 2020-03-12 DIAGNOSIS — I2510 Atherosclerotic heart disease of native coronary artery without angina pectoris: Secondary | ICD-10-CM | POA: Diagnosis not present

## 2020-03-12 DIAGNOSIS — I739 Peripheral vascular disease, unspecified: Secondary | ICD-10-CM | POA: Diagnosis not present

## 2020-03-12 DIAGNOSIS — E785 Hyperlipidemia, unspecified: Secondary | ICD-10-CM | POA: Diagnosis not present

## 2020-03-12 LAB — HEPATIC FUNCTION PANEL
ALT: 12 IU/L (ref 0–32)
AST: 14 IU/L (ref 0–40)
Albumin: 4.5 g/dL (ref 3.7–4.7)
Alkaline Phosphatase: 65 IU/L (ref 39–117)
Bilirubin Total: 0.2 mg/dL (ref 0.0–1.2)
Bilirubin, Direct: 0.07 mg/dL (ref 0.00–0.40)
Total Protein: 6.3 g/dL (ref 6.0–8.5)

## 2020-03-12 LAB — LIPID PANEL
Chol/HDL Ratio: 6.5 ratio — ABNORMAL HIGH (ref 0.0–4.4)
Cholesterol, Total: 414 mg/dL — ABNORMAL HIGH (ref 100–199)
HDL: 64 mg/dL (ref >39)
LDL Chol Calc (NIH): 308 mg/dL — ABNORMAL HIGH (ref 0–99)
Triglycerides: 201 mg/dL — ABNORMAL HIGH (ref 0–149)
VLDL Cholesterol Cal: 42 mg/dL — ABNORMAL HIGH (ref 5–40)

## 2020-03-12 LAB — BASIC METABOLIC PANEL
BUN/Creatinine Ratio: 13 (ref 12–28)
BUN: 11 mg/dL (ref 8–27)
CO2: 22 mmol/L (ref 20–29)
Calcium: 9.3 mg/dL (ref 8.7–10.3)
Chloride: 102 mmol/L (ref 96–106)
Creatinine, Ser: 0.86 mg/dL (ref 0.57–1.00)
GFR calc Af Amer: 74 mL/min/{1.73_m2} (ref >59)
GFR calc non Af Amer: 64 mL/min/{1.73_m2} (ref >59)
Glucose: 97 mg/dL (ref 65–99)
Potassium: 4.8 mmol/L (ref 3.5–5.2)
Sodium: 139 mmol/L (ref 134–144)

## 2020-03-12 MED ORDER — LISINOPRIL 20 MG PO TABS: 20.0000 mg | ORAL_TABLET | Freq: Two times a day (BID) | ORAL | 3 refills | Status: DC

## 2020-03-12 MED ORDER — CLOPIDOGREL BISULFATE 75 MG PO TABS: 75.0000 mg | ORAL_TABLET | Freq: Every day | ORAL | 3 refills | Status: DC

## 2020-03-12 NOTE — Telephone Encounter (Signed)
Called and spoke with patient about cost of Repatha. Pt has $95 deducible (may have already met) and cost is $47/month. Also explained the healthwell grant and what information would be needed to apply. I will finish submitting prior auth when patients labs from today result.  Once approved, I will call the patient and she will let us know if she would like [place] to apply for healthwell grant.

## 2020-03-12 NOTE — Patient Instructions (Signed)
Medication Instructions:  Your physician recommends that you continue on your current medications as directed. Please refer to the Current Medication list given to you today.  *If you need a refill on your cardiac medications before your next appointment, please call your pharmacy*   Lab Work: TODAY - cholesterol, liver panel, basic metabolic panel If you have labs (blood work) drawn today and your tests are completely normal, you will receive your results only by: [name] MyChart Message (if you have MyChart) OR . A paper copy in the mail If you have any lab test that is abnormal or we need to change your treatment, we will call you to review the results.   Testing/Procedures: None Ordered   Follow-Up: We will discuss getting medication for your high cholesterol with the pharmacists and be in touch with you soon.   At [facility], you and your health needs are our priority.  As part of our continuing mission to provide you with exceptional heart care, we have created designated Provider Care Teams.  These Care Teams include your primary Cardiologist (physician) and Advanced Practice Providers (APPs -  Physician Assistants and Nurse Practitioners) who all work together to provide you with the care you need, when you need it.  We recommend signing up for the patient portal called "MyChart".  Sign up information is provided on this After Visit Summary.  MyChart is used to connect with patients for Virtual Visits (Telemedicine).  Patients are able to view lab/test results, encounter notes, upcoming appointments, etc.  Non-urgent messages can be sent to your provider as well.   To learn more about what you can do with MyChart, go to [email or web address].    Your next appointment:   6 month(s)  The format for your next appointment:   In Person  Provider:   Richardson Dopp, PA-C or Robbie Lis, PA-C

## 2020-03-12 NOTE — Progress Notes (Signed)
Cardiology Office Note   Date:  03/12/2020   ID:  Katherine Hall, DOB 1941-06-20, [name]  PCP:  [name], MD  Cardiologist:   Mertie Moores, MD , [facility]   Chief Complaint  Patient presents with  . Coronary Artery Disease   Problem list 1. Coronary artery disease-status post coronary artery bypass grafting-  CORONARY ARTERY BYPASS GRAFTING x 5 -LIMA to LAD -SVG to DIAGONAL -SVG to PDA -SEQ SVG OM1 and OM2  2. Hypertension 3. Hyperlipidemia 4. Anemia     Katherine Hall is a 79 y.o. female who presents for follow-up of her coronary artery disease. She had coronary artery bypass grafting in August. 2017   She has had a moderate left pleural effusion Still gradually improving . Needs an order to do cardiac rehab.  Wants to do heart strides at [facility]   Breathing is better.  Still has some chest soreness,  The angina is better   Has very high lipids.   Cannot tolerate statins.  Has seen Fuller Canada to initiate Repatha - working on that  Still sleeping in a recliner  Jan. 8, 2018:  Katherine Hall is doing well. She had coronary artery bypass grafting in August, 2017: Is not doing cardiac rehab.  Has lots of leg pain  Needs to have surgery on her right knee Has SVG harvest site pain .  Does not take her BP regularly  Aug. 22, 2018:  Katherine Hall is still having some chest pains. Described as a discomfort.  Last 5 minutes. She does not know if it worsens with twisting or taking a deep breath  Is not exercising ,.   May 01, 2018: Seen for follow-up of her coronary artery disease.  She is status post coronary artery bypass grafting in August, 2017.  Also has a history of hypertension and hyperlipidemia. No cp.   Not exercising much  Has PVD,   Had a coated stent placed.  Complicated by a significant groin bleed Required transfusion of 2 u PRBS   Some various aches and pains .  Has some left neck pain .   Lasts for a few seconds.  Has had right  knee surgery  since I last saw her.   No CP or dyspnea , Gets fatigued easily . Does not sleep well   Dec. 2, 2019:  Not exercising  Leg pain  No CP  Has arthrisit and PAD  Her Lipids are very high. Did well with Repatha but she couldn't afford it  Is in the Clear trial  But couldn't tolerate the Bempidoic acid    Aug. 25, 2020  Doing well Has some pain in her left leg.  Pain increases with waking  Concerned with PAD  Is in a cholesterol study so we do not have chol levels since 2018.   March 12, 2020:  Katherine Hall  was seen today for a follow-up of her hyperlipidemia, coronary artery disease, leg pain. Lipids are still elevated.     Past Medical History:  Diagnosis Date  . Anemia   . Arthritis   . Gastric ulcer    a. h/o bleeding ulcer 2009 requiring 3 pints of blood.  [name] History of removal of cyst    a. 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  [name] HLD (hyperlipidemia)    a. h/o intolerance of statins, prev on Repatha but insurance stopped covering.  [name] HTN (hypertension)     Past Surgical History:  Procedure  Laterality Date  . ABDOMINAL AORTOGRAM W/LOWER EXTREMITY Bilateral 11/06/2019   Procedure: ABDOMINAL AORTOGRAM W/LOWER EXTREMITY;  Surgeon: Wellington Hampshire, MD;  Location: [place] [facility];  Service: Cardiovascular;  Laterality: Bilateral;  . ABDOMINAL HYSTERECTOMY    . APPENDECTOMY    . CARDIAC CATHETERIZATION N/A 07/05/2016   Procedure: Left Heart Cath and Coronary Angiography;  Surgeon: Burnell Blanks, MD;  Location: [place] [facility];  Service: Cardiovascular;  Laterality: N/A;  . CARDIAC SURGERY     cyst, not on heart.  . CORONARY ARTERY BYPASS GRAFT N/A 07/07/2016   Procedure: CORONARY ARTERY BYPASS GRAFTING (CABG) x 5 with endoscopic harvesting of the right greater saphenous vein;  Surgeon: Gaye Pollack, MD;  Location: [place] OR;  Service: Open Heart Surgery;  Laterality: N/A;  . HEMORRHOID SURGERY    . INTRAOPERATIVE TRANSESOPHAGEAL  ECHOCARDIOGRAM N/A 07/07/2016   Procedure: INTRAOPERATIVE TRANSESOPHAGEAL ECHOCARDIOGRAM;  Surgeon: Gaye Pollack, MD;  Location: [facility] OR;  Service: Open Heart Surgery;  Laterality: N/A;  . PERIPHERAL VASCULAR BALLOON ANGIOPLASTY  11/06/2019   Procedure: PERIPHERAL VASCULAR BALLOON ANGIOPLASTY;  Surgeon: Wellington Hampshire, MD;  Location: [place] [facility];  Service: Cardiovascular;;  cutting and dcb  . SHOULDER SURGERY       Current Outpatient Medications  Medication Sig Dispense Refill  . acetaminophen (TYLENOL) 650 MG CR tablet Take 1,300 mg by mouth daily.    [name] acyclovir (ZOVIRAX) 400 MG tablet Take 800 mg by mouth daily as needed (fever blisters).     [name] alendronate (FOSAMAX) 70 MG tablet Take 70 mg by mouth every Saturday.     . Calcium Carbonate-Vitamin D 600-200 MG-UNIT TABS Take by mouth.    . clopidogrel (PLAVIX) 75 MG tablet Take 1 tablet (75 mg total) by mouth daily. 90 tablet 3  . diphenhydramine-acetaminophen (TYLENOL PM) 25-500 MG TABS tablet Take 2 tablets by mouth at bedtime as needed (sleep).    [name] escitalopram (LEXAPRO) 10 MG tablet Take 10 mg by mouth daily.     . ferrous sulfate 325 (65 FE) MG tablet Take 325 mg by mouth every Monday, Wednesday, and Friday.    . fluticasone (FLONASE) 50 MCG/ACT nasal spray Place 2 sprays into both nostrils daily as needed for allergies.     [name] gabapentin (NEURONTIN) 100 MG capsule Take 100 mg by mouth 3 (three) times daily.     [name] gatifloxacin (ZYMAXID) 0.5 % SOLN Place 1 drop into the left eye 2 (two) times daily.    [name] levothyroxine (SYNTHROID, LEVOTHROID) 50 MCG tablet Take 50 mcg by mouth daily.    [name] lisinopril (ZESTRIL) 20 MG tablet Take 1 tablet (20 mg total) by mouth 2 (two) times daily. 180 tablet 3  . Magnesium (V-R MAGNESIUM) 250 MG TABS Take by mouth.    . Menthol (ICY HOT) 5 % PTCH Apply 1 patch topically daily as needed (pain).    . nitroGLYCERIN (NITROSTAT) 0.4 MG SL tablet Place 1 tablet (0.4 mg total) under the tongue every 5  (five) minutes x 3 doses as needed for chest pain. 25 tablet 1  . pantoprazole (PROTONIX) 40 MG tablet Take 40 mg by mouth 2 (two) times daily.     . polyethylene glycol powder (GLYCOLAX/MIRALAX) 17 GM/SCOOP powder Take by mouth.    [name] rOPINIRole (REQUIP) 2 MG tablet Take 2 mg by mouth at bedtime.    . traMADol (ULTRAM) 50 MG tablet Take 1 tablet (50 mg total) by mouth every 6 (six) hours  as needed for severe pain. 28 tablet 0   No current facility-administered medications for this visit.    Allergies:   Penicillins, Lincomycin, Naproxen sodium, Statins, Latex, and Tape    Social History:  The patient  reports that she has never smoked. She has never used smokeless tobacco. She reports that she does not drink alcohol or use drugs.   Family History:  The patient's family history includes CAD in her father; Coronary artery disease in her brother and sister; Stroke in her mother.    ROS:   Noted in current history, otherwise review of systems is negative.   Physical Exam: Blood pressure 130/82, pulse 82, height 4\' 10"  (1.473 m), weight 128 lb 12 oz (58.4 kg), SpO2 96 %.  GEN:  Well nourished, well developed in no acute distress HEENT: Normal NECK: No JVD; No carotid bruits LYMPHATICS: No lymphadenopathy CARDIAC: RRR , no murmurs, rubs, gallops RESPIRATORY:  Clear to auscultation without rales, wheezing or rhonchi  ABDOMEN: Soft, non-tender, non-distended MUSCULOSKELETAL:  No edema; No deformity  SKIN: Warm and dry NEUROLOGIC:  Alert and oriented x 3   Recent Labs: 11/08/2019: BUN 9; Creatinine, Ser 0.67; Potassium 3.6; Sodium 137 11/09/2019: Hemoglobin 8.9; Platelets 254    Lipid Panel    Component Value Date/Time   CHOL 220 (H) 11/08/2019 0404   CHOL 319 (H) 07/26/2017 1006   TRIG 105 11/08/2019 0404   HDL 48 11/08/2019 0404   HDL 61 07/26/2017 1006   CHOLHDL 4.6 11/08/2019 0404   VLDL 21 11/08/2019 0404   LDLCALC 151 (H) 11/08/2019 0404   LDLCALC 224 (H) 07/26/2017 1006       Wt Readings from Last 3 Encounters:  03/12/20 128 lb 12 oz (58.4 kg)  12/10/19 129 lb 12.8 oz (58.9 kg)  11/25/19 131 lb (59.4 kg)      Other studies Reviewed: Additional studies/ records that were reviewed today include: . Review of the above records demonstrates:    ASSESSMENT AND PLAN:  1. Coronary artery disease:  No CP   doing very well.  She is not had any angina.  She still has high lipids.  Will check to see if she can afford one of the PCSK9 inhibitors now that the price is dropped.    2. Pure  Hyperlipidemia :   We will try to get her back into the lipid clinic for PCSK9 inhibitor.       3.  Peripheral arterial disease: She had a stent procedure by Dr. Velva Harman.  It was complicated by significant groin bleed requiring 2 units of packed red blood cells. She will follow up with Dr. Fletcher Anon  in July.,  -    3. HTN:     .  Blood pressures well controlled.  4. Fatigue:      Current medicines are reviewed at length with the patient today.  The patient does not have concerns regarding medicines.  Labs/ tests ordered today include:   Orders Placed This Encounter  Procedures  . Lipid Profile  . Basic Metabolic Panel (BMET)  . Hepatic function panel     Disposition:   FU with an APP  in 6 months      Mertie Moores, MD  03/12/2020 10:31 AM    [facility] [address] [place], [place], [place]  [address] Phone: [phone]; Fax: [phone]

## 2020-03-13 MED ORDER — REPATHA SURECLICK 140 MG/ML ~~LOC~~ SOAJ: 1.0000 "pen " | SUBCUTANEOUS | 11 refills | Status: DC

## 2020-03-13 NOTE — Telephone Encounter (Signed)
Repatha approved through 09/12/2020. Patient would like to apply for healthwell grant but will need to call us back with income info. She would like Rx at [facility] [address] in [place] since we cannot use grant with [facility]

## 2020-03-13 NOTE — Addendum Note (Signed)
Addended by: Marcelle Overlie D on: 03/13/2020 04:22 PM   Modules accepted: Orders

## 2020-03-13 NOTE — Telephone Encounter (Signed)
Prior authorization sent to plan 4/9. Will await determination.

## 2020-03-13 NOTE — Telephone Encounter (Signed)
[place] approved  Pharmacy Card Id [id number] Group [id number] PCN [id number] BIN [id number]  Rx sent to [facility]. Healthwell info faxed to pharmacy.  Patient made aware that if copay is not $0 to give [place] a call. Will call patient in 3 months to check labs

## 2020-05-27 ENCOUNTER — Telehealth: Payer: Self-pay | Admitting: *Deleted

## 2020-05-27 NOTE — Telephone Encounter (Signed)
Attempted to call patient to schedule a CLEAR research appointment. Unable to reach patient but did leave a voicemail to give [place] a call back as soon as she is able to.

## 2020-06-02 ENCOUNTER — Telehealth: Payer: Self-pay | Admitting: *Deleted

## 2020-06-02 NOTE — Telephone Encounter (Signed)
Talked with Katherine Hall today about the [employer] study. She would like to continue in study on telephone call's only. She will no longer be taking the study medication.

## 2020-06-03 ENCOUNTER — Ambulatory Visit (HOSPITAL_COMMUNITY)
Admission: RE | Admit: 2020-06-03 | Payer: Medicare Other | Source: Ambulatory Visit | Attending: Cardiovascular Disease | Admitting: Cardiovascular Disease

## 2020-06-09 ENCOUNTER — Ambulatory Visit: Payer: Medicare Other | Admitting: Cardiovascular Disease

## 2020-06-09 ENCOUNTER — Telehealth: Payer: Self-pay

## 2020-06-09 DIAGNOSIS — E785 Hyperlipidemia, unspecified: Secondary | ICD-10-CM

## 2020-06-09 NOTE — Telephone Encounter (Signed)
-----   Message from Ramond Dial, [place] sent at 06/05/2020  7:36 AM EDT -----  ----- Message ----- From: Marcelle Overlie D, RPH-CPP Sent: 06/05/2020 To: Ramond Dial, [place]  Set up lipid labs

## 2020-06-09 NOTE — Telephone Encounter (Signed)
lmomed the pt to call to schedule labs, orders placed

## 2020-06-10 NOTE — Telephone Encounter (Signed)
Patient returned call.  Lipid panel scheduled for 7/15

## 2020-06-12 NOTE — Addendum Note (Signed)
Addended by: Marcelle Overlie D on: 06/12/2020 12:29 PM   Modules accepted: Orders

## 2020-06-17 ENCOUNTER — Other Ambulatory Visit (HOSPITAL_COMMUNITY): Payer: Self-pay | Admitting: Psychiatry

## 2020-06-17 ENCOUNTER — Other Ambulatory Visit: Payer: Self-pay

## 2020-06-17 ENCOUNTER — Ambulatory Visit (HOSPITAL_COMMUNITY)
Admission: RE | Admit: 2020-06-17 | Discharge: 2020-06-17 | Disposition: A | Discharge status: Home / Self Care | Payer: Medicare Other | Source: Ambulatory Visit | Attending: Internal Medicine | Admitting: Internal Medicine

## 2020-06-17 ENCOUNTER — Other Ambulatory Visit (HOSPITAL_COMMUNITY): Payer: Self-pay | Admitting: Neurology

## 2020-06-17 ENCOUNTER — Other Ambulatory Visit (HOSPITAL_COMMUNITY): Payer: Self-pay | Admitting: Cardiovascular Disease

## 2020-06-17 DIAGNOSIS — I739 Peripheral vascular disease, unspecified: Secondary | ICD-10-CM | POA: Insufficient documentation

## 2020-06-18 ENCOUNTER — Other Ambulatory Visit: Payer: Medicare Other | Admitting: *Deleted

## 2020-06-18 DIAGNOSIS — E785 Hyperlipidemia, unspecified: Secondary | ICD-10-CM

## 2020-06-18 LAB — HEPATIC FUNCTION PANEL
ALT: 9 IU/L (ref 0–32)
AST: 10 IU/L (ref 0–40)
Albumin: 4.7 g/dL (ref 3.7–4.7)
Alkaline Phosphatase: 49 IU/L (ref 48–121)
Bilirubin Total: 0.2 mg/dL (ref 0.0–1.2)
Bilirubin, Direct: 0.08 mg/dL (ref 0.00–0.40)
Total Protein: 6.2 g/dL (ref 6.0–8.5)

## 2020-06-19 ENCOUNTER — Telehealth: Payer: Self-pay | Admitting: Cardiovascular Disease

## 2020-06-19 DIAGNOSIS — E785 Hyperlipidemia, unspecified: Secondary | ICD-10-CM

## 2020-06-19 NOTE — Telephone Encounter (Signed)
    Pt returning call for lab result. Advised phone note has been sent and nurse will call her back

## 2020-06-19 NOTE — Telephone Encounter (Signed)
Labs are stable (liver was only done) and will get Lipid added to the blood sample that was drawn. Lanny Hurst notifying lab today to check lipids .Adonis Housekeeper

## 2020-06-19 NOTE — Telephone Encounter (Signed)
New Message  Patient is returning call. Please give patient a call back to discuss lab results.

## 2020-06-23 LAB — LIPID PANEL WITH LDL/HDL RATIO
Cholesterol, Total: 338 mg/dL — ABNORMAL HIGH (ref 100–199)
HDL: 73 mg/dL (ref >39)
LDL Chol Calc (NIH): 244 mg/dL — ABNORMAL HIGH (ref 0–99)
LDL/HDL Ratio: 3.3 ratio — ABNORMAL HIGH (ref 0.0–3.2)
Triglycerides: 121 mg/dL (ref 0–149)
VLDL Cholesterol Cal: 21 mg/dL (ref 5–40)

## 2020-06-23 LAB — SPECIMEN STATUS REPORT

## 2020-06-23 NOTE — Telephone Encounter (Signed)
Called patient to discuss he lipid labs. We did not see as big of a reduction in LDL than expected. Only saw about 20% reduction. Patient has given 6 injections. Has not missed any.  Patient was on the clear study medication, but hasn't been taking due to potential back pains. She states that she would be willing to resume taking after next week when she has her back ablation.  We also discussed trying to switch to Praluent to see if we get a better LDL reduction. Patient requested I also speak with Dr. Acie Fredrickson for his input.

## 2020-06-25 NOTE — Telephone Encounter (Signed)
Called patient with results of lipid panel and recommendations from Dr. Acie Fredrickson.  Patient is in agreement with plan to see Dr. Debara Pickett.

## 2020-06-25 NOTE — Telephone Encounter (Signed)
I do not have any further advice on her lipid management. I suggest she have a consult with Dr Debara Pickett

## 2020-06-25 NOTE — Addendum Note (Signed)
Addended by: Saddie Benders E on: 06/25/2020 12:00 PM   Modules accepted: Orders

## 2020-07-07 ENCOUNTER — Other Ambulatory Visit: Payer: Self-pay

## 2020-07-07 ENCOUNTER — Ambulatory Visit: Payer: Medicare Other | Admitting: Cardiovascular Disease

## 2020-07-07 ENCOUNTER — Encounter: Payer: Self-pay | Admitting: Cardiovascular Disease

## 2020-07-07 VITALS — BP 164/88 | HR 76 | Ht <= 58 in | Wt 123.6 lb

## 2020-07-07 DIAGNOSIS — I2510 Atherosclerotic heart disease of native coronary artery without angina pectoris: Secondary | ICD-10-CM | POA: Diagnosis not present

## 2020-07-07 DIAGNOSIS — I739 Peripheral vascular disease, unspecified: Secondary | ICD-10-CM | POA: Diagnosis not present

## 2020-07-07 DIAGNOSIS — E785 Hyperlipidemia, unspecified: Secondary | ICD-10-CM | POA: Diagnosis not present

## 2020-07-07 DIAGNOSIS — I10 Essential (primary) hypertension: Secondary | ICD-10-CM | POA: Diagnosis not present

## 2020-07-07 NOTE — Patient Instructions (Signed)

## 2020-07-07 NOTE — Progress Notes (Signed)
Cardiology Office Note   Date:  07/07/2020   ID:  Katherine Hall, DOB 07/22/1941, MRN [id number]  PCP:  Stacie Glaze, DO  Cardiologist: Dr. Acie Fredrickson  No chief complaint on file.     History of Present Illness: Katherine Hall is a 79 y.o. female who is here today for follow-up regarding peripheral arterial disease.   She has known history of coronary artery disease status post CABG in 2017, essential hypertension, anemia and hyperlipidemia with intolerance to statins.  She is not diabetic and is a lifelong non-smoker. The patient was seen in late 2020 for severe left calf claudication.  Vascular studies showed an ABI of 0.96 on the right and 0.85 on the left with evidence of severe left SFA stenosis. I proceeded with angiography on December 2 which showed mild to moderate iliac disease, on the right side, there was mild diffuse SFA and popliteal disease with one-vessel runoff below the knee.  On the left side there was severe mid SFA stenosis and one-vessel runoff below the knee.  I performed successful drug-coated balloon angioplasty to the mid left SFA.  Unfortunately, the procedure was complicated by a large groin hematoma that required transfusion.  T  She has been doing reasonably well with no recurrent claudication. She has known history of chronic back pain followed closely with neurosurgery.  Past Medical History:  Diagnosis Date  . Anemia   . Arthritis   . Gastric ulcer    a. h/o bleeding ulcer 2009 requiring 3 pints of blood.  [name] History of removal of cyst    a. 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  [name] HLD (hyperlipidemia)    a. h/o intolerance of statins, prev on Repatha but insurance stopped covering.  [name] HTN (hypertension)     Past Surgical History:  Procedure Laterality Date  . ABDOMINAL AORTOGRAM W/LOWER EXTREMITY Bilateral 11/06/2019   Procedure: ABDOMINAL AORTOGRAM W/LOWER EXTREMITY;  Surgeon: Wellington Hampshire, MD;  Location: [place] [facility];  Service: Cardiovascular;  Laterality: Bilateral;  . ABDOMINAL HYSTERECTOMY    . APPENDECTOMY    . CARDIAC CATHETERIZATION N/A 07/05/2016   Procedure: Left Heart Cath and Coronary Angiography;  Surgeon: Burnell Blanks, MD;  Location: [place] [facility];  Service: Cardiovascular;  Laterality: N/A;  . CARDIAC SURGERY     cyst, not on heart.  . CORONARY ARTERY BYPASS GRAFT N/A 07/07/2016   Procedure: CORONARY ARTERY BYPASS GRAFTING (CABG) x 5 with endoscopic harvesting of the right greater saphenous vein;  Surgeon: Gaye Pollack, MD;  Location: [place] [facility];  Service: Open Heart Surgery;  Laterality: N/A;  . HEMORRHOID SURGERY    . INTRAOPERATIVE TRANSESOPHAGEAL ECHOCARDIOGRAM N/A 07/07/2016   Procedure: INTRAOPERATIVE TRANSESOPHAGEAL ECHOCARDIOGRAM;  Surgeon: Gaye Pollack, MD;  Location: [facility];  Service: Open Heart Surgery;  Laterality: N/A;  . PERIPHERAL VASCULAR BALLOON ANGIOPLASTY  11/06/2019   Procedure: PERIPHERAL VASCULAR BALLOON ANGIOPLASTY;  Surgeon: Wellington Hampshire, MD;  Location: [place] [facility];  Service: Cardiovascular;;  cutting and dcb  . SHOULDER SURGERY       Current Outpatient Medications  Medication Sig Dispense Refill  . acetaminophen (TYLENOL) 650 MG CR tablet Take 1,300 mg by mouth daily.    [name] acyclovir (ZOVIRAX) 400 MG tablet Take 800 mg by mouth daily as needed (fever blisters).     [name] alendronate (FOSAMAX) 70 MG tablet Take 70 mg by mouth every Saturday.     . clopidogrel (  PLAVIX) 75 MG tablet Take 1 tablet (75 mg total) by mouth daily. 90 tablet 3  . diphenhydramine-acetaminophen (TYLENOL PM) 25-500 MG TABS tablet Take 2 tablets by mouth at bedtime as needed (sleep).    . Evolocumab (REPATHA SURECLICK) 671 MG/ML SOAJ Inject 1 pen into the skin every 14 (fourteen) days. 2 pen 11  . ferrous sulfate 325 (65 FE) MG tablet Take 325 mg by mouth every Monday, Wednesday, and Friday.    . fluticasone (FLONASE) 50 MCG/ACT nasal spray Place 2 sprays into both  nostrils daily as needed for allergies.     [name] gatifloxacin (ZYMAXID) 0.5 % SOLN Place 1 drop into the left eye 2 (two) times daily.    [name] levothyroxine (SYNTHROID, LEVOTHROID) 50 MCG tablet Take 50 mcg by mouth daily.    [name] lisinopril (ZESTRIL) 20 MG tablet Take 1 tablet (20 mg total) by mouth 2 (two) times daily. 180 tablet 3  . nitroGLYCERIN (NITROSTAT) 0.4 MG SL tablet Place 1 tablet (0.4 mg total) under the tongue every 5 (five) minutes x 3 doses as needed for chest pain. 25 tablet 1  . pantoprazole (PROTONIX) 40 MG tablet Take 40 mg by mouth 2 (two) times daily.     [name] rOPINIRole (REQUIP) 2 MG tablet Take 2 mg by mouth at bedtime.    . traMADol (ULTRAM) 50 MG tablet Take 1 tablet (50 mg total) by mouth every 6 (six) hours as needed for severe pain. 28 tablet 0   No current facility-administered medications for this visit.    Allergies:   Penicillins, Lincomycin, Naproxen sodium, Statins, Latex, and Tape    Social History:  The patient  reports that she has never smoked. She has never used smokeless tobacco. She reports that she does not drink alcohol and does not use drugs.   Family History:  The patient's family history includes CAD in her father; Coronary artery disease in her brother and sister; Stroke in her mother.    ROS:  Please see the history of present illness.   Otherwise, review of systems are positive for none.   All other systems are reviewed and negative.    PHYSICAL EXAM: VS:  BP (!) 164/88   Pulse 76   Ht 4\' 10"  (1.473 m)   Wt 123 lb 9.6 oz (56.1 kg)   SpO2 97%   BMI 25.83 kg/m  , BMI Body mass index is 25.83 kg/m. GEN: Well nourished, well developed, in no acute distress  HEENT: normal  Neck: no JVD, carotid bruits, or masses Cardiac: RRR; no murmurs, rubs, or gallops,no edema  Respiratory:  clear to auscultation bilaterally, normal work of breathing GI: soft, nontender, nondistended, + BS MS: no deformity or atrophy  Skin: warm and dry, no rash Neuro:   Strength and sensation are intact Psych: euthymic mood, full affect Vascular: Femoral pulses +2 bilaterally. Dorsalis pedis +1 on the right and +2 on the left   EKG:  EKG is ordered today. EKG showed normal sinus rhythm with possible left atrial enlargement.   Recent Labs: 11/09/2019: Hemoglobin 8.9; Platelets 254 03/12/2020: BUN 11; Creatinine, Ser 0.86; Potassium 4.8; Sodium 139 06/18/2020: ALT 9    Lipid Panel    Component Value Date/Time   CHOL 338 (H) 06/18/2020 1039   TRIG 121 06/18/2020 1039   HDL 73 06/18/2020 1039   CHOLHDL 6.5 (H) 03/12/2020 1011   CHOLHDL 4.6 11/08/2019 0404   VLDL 21 11/08/2019 0404   LDLCALC 244 (H) 06/18/2020 1039  Wt Readings from Last 3 Encounters:  07/07/20 123 lb 9.6 oz (56.1 kg)  03/12/20 128 lb 12 oz (58.4 kg)  12/10/19 129 lb 12.8 oz (58.9 kg)       No flowsheet data found.    ASSESSMENT AND PLAN:  1.  Peripheral arterial disease: Status post  left SFA drug-coated balloon angioplasty for severe claudication. ABI remains normal and she has palpable dorsalis pedis. She continues to be on Plavix and this is optional at this time but has better outcome in PAD patients than aspirin and this will be continued. Repeat Doppler studies in July of next year.  2.  Coronary artery disease involving native coronary arteries: Status post CABG in 2017 with no recurrent angina since then.  3. Familial hyperlipidemia with severely elevated LDL: The patient will be seeing Dr. Debara Pickett in September.   4.  Essential hypertension: Blood pressure is reasonably controlled.      Disposition:   FU with me in 12 months  Signed,  [name], MD  07/07/2020 10:27 AM    [place]

## 2020-08-21 ENCOUNTER — Telehealth (INDEPENDENT_AMBULATORY_CARE_PROVIDER_SITE_OTHER): Payer: Medicare Other | Admitting: Internal Medicine

## 2020-08-21 ENCOUNTER — Encounter: Payer: Self-pay | Admitting: Internal Medicine

## 2020-08-21 VITALS — Ht <= 58 in | Wt 123.0 lb

## 2020-08-21 DIAGNOSIS — I2510 Atherosclerotic heart disease of native coronary artery without angina pectoris: Secondary | ICD-10-CM

## 2020-08-21 DIAGNOSIS — M791 Myalgia, unspecified site: Secondary | ICD-10-CM | POA: Diagnosis not present

## 2020-08-21 DIAGNOSIS — I739 Peripheral vascular disease, unspecified: Secondary | ICD-10-CM

## 2020-08-21 DIAGNOSIS — E7801 Familial hypercholesterolemia: Secondary | ICD-10-CM | POA: Diagnosis not present

## 2020-08-21 DIAGNOSIS — T466X5A Adverse effect of antihyperlipidemic and antiarteriosclerotic drugs, initial encounter: Secondary | ICD-10-CM

## 2020-08-21 NOTE — Patient Instructions (Signed)
Medication Instructions:  Repatha monthly infusion and Juxtapid --we will call with more details  *If you need a refill on your cardiac medications before your next appointment, please call your pharmacy*

## 2020-08-21 NOTE — Progress Notes (Signed)
Virtual Visit via Telephone Note   This visit type was conducted due to national recommendations for restrictions regarding the COVID-19 Pandemic (e.g. social distancing) in an effort to limit this patient's exposure and mitigate transmission in our community.  Due to her co-morbid illnesses, this patient is at least at moderate risk for complications without adequate follow up.  This format is felt to be most appropriate for this patient at this time.  The patient did not have access to video technology/had technical difficulties with video requiring transitioning to audio format only (telephone).  All issues noted in this document were discussed and addressed.  No physical exam could be performed with this format.  Please refer to the patient's chart for her  consent to telehealth for [facility].   Date:  08/21/2020   ID:  Katherine Hall, DOB [id number], MRN [id number] The patient was identified using 2 identifiers.  Evaluation Performed:  New Patient Evaluation  Patient Location:  [address] [place] [address]  Provider location:   [address] [place], [place] [address]  PCP:  Katherine Glaze, DO  Cardiologist:  Katherine Moores, MD Electrophysiologist:  None   Chief Complaint: Manage dyslipidemia  History of Present Illness:    Katherine Hall is a 79 y.o. female who presents via audio/video conferencing for a telehealth visit today.  This is a 79 year old female patient of Dr. Acie Fredrickson with significant dyslipidemia that has been challenging to manage.  Unfortunately she also has significant cardiovascular disease.  Known coronary artery disease and is status post CABG x5 in 2017 peripheral arterial disease status post left SFA angioplasty for severe claudication.  Other comorbidities include hypertension and familial hyperlipidemia.  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  Urgently she had  significant statin intolerance causing myalgias to the point where she could not get out of bed.  She is tried numerous statins including both high potency statins with similar results.  Subsequently she has been on ezetimibe which she said also caused her side effects.  She was then placed on Repatha 140 mg every 2 weeks.  She has been using that as prescribed in her thigh, rotating the injection site for months.  Her last lipid profile 2 months ago showed total cholesterol 338, triglycerides 121, HDL 73 and an LDL of 244 which is improved somewhat from 5 months ago at which time her total cholesterol was 414, triglycerides 201, HDL 64 and LDL of 308.  The lowest lipid profile seen was a total cholesterol 220 with an LDL of 151 however this may have been in combination with statin therapy which he ultimately could not tolerate.  More recently apparently she was referred for the CLEAR trial, however it was noted that she is no longer taking the study drug.  It was not clear whether she was on therapy or placebo.  This suggest that there might be an option for her to take bempedoic acid now that it is commercially available.  There also is a significant family history of coronary disease including coronary disease in her father who died of an MI in his 60s, one sister who died of a massive MI in her 55s and another sister who apparently has 66 stents.  She has 2 brothers 1 who had an MI in his 24s and died at 4 and another who has had multivessel bypass surgery.  The patient does not have symptoms concerning for  COVID-19 infection (fever, chills, cough, or new SHORTNESS OF BREATH).    Prior CV studies:   The following studies were reviewed today:  Chart reviewed, lab work, family history  PMHx:  Past Medical History:  Diagnosis Date  . Anemia   . Arthritis   . Gastric ulcer    a. h/o bleeding ulcer 2009 requiring 3 pints of blood.  [name] History of removal of cyst    a. per pt, h/o open heart surgery  for tennis-ball sized cyst on heart.  [name] HLD (hyperlipidemia)    a. h/o intolerance of statins, prev on Repatha but insurance stopped covering.  [name] HTN (hypertension)     Past Surgical History:  Procedure Laterality Date  . ABDOMINAL AORTOGRAM W/LOWER EXTREMITY Bilateral 11/06/2019   Procedure: ABDOMINAL AORTOGRAM W/LOWER EXTREMITY;  Surgeon: Wellington Hampshire, MD;  Location: [place] [facility];  Service: Cardiovascular;  Laterality: Bilateral;  . ABDOMINAL HYSTERECTOMY    . APPENDECTOMY    . CARDIAC CATHETERIZATION N/A 07/05/2016   Procedure: Left Heart Cath and Coronary Angiography;  Surgeon: Burnell Blanks, MD;  Location: [place] [facility];  Service: Cardiovascular;  Laterality: N/A;  . CARDIAC SURGERY     cyst, not on heart.  . CORONARY ARTERY BYPASS GRAFT N/A 07/07/2016   Procedure: CORONARY ARTERY BYPASS GRAFTING (CABG) x 5 with endoscopic harvesting of the right greater saphenous vein;  Surgeon: Gaye Pollack, MD;  Location: [place] OR;  Service: Open Heart Surgery;  Laterality: N/A;  . HEMORRHOID SURGERY    . INTRAOPERATIVE TRANSESOPHAGEAL ECHOCARDIOGRAM N/A 07/07/2016   Procedure: INTRAOPERATIVE TRANSESOPHAGEAL ECHOCARDIOGRAM;  Surgeon: Gaye Pollack, MD;  Location: [facility];  Service: Open Heart Surgery;  Laterality: N/A;  . PERIPHERAL VASCULAR BALLOON ANGIOPLASTY  11/06/2019   Procedure: PERIPHERAL VASCULAR BALLOON ANGIOPLASTY;  Surgeon: Wellington Hampshire, MD;  Location: [place];  Service: Cardiovascular;;  cutting and dcb  . SHOULDER SURGERY      FAMHx:  Family History  Problem Relation Age of Onset  . Stroke Mother        Stroke age 19  . CAD Father        died of MI age 9  . Coronary artery disease Sister        one sister had massive MI at 25, another sister has 26 stents  . Coronary artery disease Brother        one brother had MI at age 81 and died at 37, another has had 4 MIs and 2 bypasses    SOCHx:   reports that she has never smoked. She has never  used smokeless tobacco. She reports that she does not drink alcohol and does not use drugs.  ALLERGIES:  Allergies  Allergen Reactions  . Penicillins Anaphylaxis    Did it involve swelling of the face/tongue/throat, SOB, or low BP? Yes Did it involve sudden or severe rash/hives, skin peeling, or any reaction on the inside of your mouth or nose? No Did you need to seek medical attention at a hospital or doctor's office? Yes When did it last happen?10+ years If all above answers are "NO", may proceed with cephalosporin use.    . Lincomycin Swelling and Other (See Comments)    passed out  . Naproxen Sodium Other (See Comments)    Stomach ulcers  . Statins Other (See Comments)    Myalgias  . Latex Other (See Comments)    Blisters when in contact with skin  . Tape  blistering    MEDS:  Current Meds  Medication Sig  . acetaminophen (TYLENOL) 650 MG CR tablet Take 1,300 mg by mouth daily.  [name] acyclovir (ZOVIRAX) 400 MG tablet Take 800 mg by mouth daily as needed (fever blisters).   [name] alendronate (FOSAMAX) 70 MG tablet Take 70 mg by mouth every Saturday.   . clopidogrel (PLAVIX) 75 MG tablet Take 1 tablet (75 mg total) by mouth daily.  . diphenhydramine-acetaminophen (TYLENOL PM) 25-500 MG TABS tablet Take 2 tablets by mouth at bedtime as needed (sleep).  . Evolocumab (REPATHA SURECLICK) 332 MG/ML SOAJ Inject 1 pen into the skin every 14 (fourteen) days.  . ferrous sulfate 325 (65 FE) MG tablet Take 325 mg by mouth every Monday, Wednesday, and Friday.  . fluticasone (FLONASE) 50 MCG/ACT nasal spray Place 2 sprays into both nostrils daily as needed for allergies.   [name] gatifloxacin (ZYMAXID) 0.5 % SOLN Place 1 drop into the left eye 2 (two) times daily.  [name] levothyroxine (SYNTHROID, LEVOTHROID) 50 MCG tablet Take 50 mcg by mouth daily.  [name] lisinopril (ZESTRIL) 20 MG tablet Take 1 tablet (20 mg total) by mouth 2 (two) times daily.  . nitroGLYCERIN (NITROSTAT) 0.4 MG SL tablet Place  1 tablet (0.4 mg total) under the tongue every 5 (five) minutes x 3 doses as needed for chest pain.  . pantoprazole (PROTONIX) 40 MG tablet Take 40 mg by mouth 2 (two) times daily.   [name] rOPINIRole (REQUIP) 2 MG tablet Take 2 mg by mouth at bedtime.  . traMADol (ULTRAM) 50 MG tablet Take 1 tablet (50 mg total) by mouth every 6 (six) hours as needed for severe pain.     ROS: Pertinent items noted in HPI and remainder of comprehensive ROS otherwise negative.  Labs/Other Tests and Data Reviewed:    Recent Labs: 11/09/2019: Hemoglobin 8.9; Platelets 254 03/12/2020: BUN 11; Creatinine, Ser 0.86; Potassium 4.8; Sodium 139 06/18/2020: ALT 9   Recent Lipid Panel Lab Results  Component Value Date/Time   CHOL 338 (H) 06/18/2020 10:39 AM   TRIG 121 06/18/2020 10:39 AM   HDL 73 06/18/2020 10:39 AM   CHOLHDL 6.5 (H) 03/12/2020 10:11 AM   CHOLHDL 4.6 11/08/2019 04:04 AM   LDLCALC 244 (H) 06/18/2020 10:39 AM    Wt Readings from Last 3 Encounters:  08/21/20 123 lb (55.8 kg)  07/07/20 123 lb 9.6 oz (56.1 kg)  03/12/20 128 lb 12 oz (58.4 kg)     Exam:    Vital Signs:  Ht 4\' 8"  (1.422 m)   Wt 123 lb (55.8 kg)   BMI 27.58 kg/m    Exam not performed due to telephone visit  ASSESSMENT & PLAN:    1. 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 Strong family history of coronary disease in multiple family members that is premature as well as history of very high cholesterol multiple family members 3. Coronary artery disease status post CABG in 2017 4. PAD status post left SFA intervention 5. Statin and ezetimibe intolerance-myalgias 6. Incomplete response to PCSK9 inhibitor  Ms. Friscia has possible HoFH with untreated LDL cholesterol is well above 300 and possibly 400.  She has had an incomplete response to PCSK9 inhibitors however has had some reduction with the medication.  Unfortunate she cannot tolerate statins or ezetimibe.  Apparently she was enrolled in the clear trial but no  longer taking study drug.  She might be a candidate to add bempedoic acid.  In addition I think she could be a  candidate for Juxtapid.  We discussed this option which work will require frequent lab work since it is a REMS program drug - I am an approved provider of this medication.  The other possibility could be Evkeeza -a new ANGPTL3 inhibitor, which is approved for HoFH but given monthly by infusion and it is very expensive - ~$250K annually.  It would be interesting to know her genetics and clinically helpful, however, for her on medicare, I think it would cost her thousands of dollars for those tests.  Finally, we could consider once monthly infusion of Repatha or possibly twice monthly (as used in the [place]).  This was used in homozygous or severe heterozygous patients with slightly greater success and I think would be similar in cost.  She ready apparently gets a Radio producer from [employer].  I will work on options for her and be in contact soon.  Thanks for the kind referral. This is a very complex lipid patient and per guidelines is best followed by a board-certified lipid specialist.  COVID-19 Education: The signs and symptoms of COVID-19 were discussed with the patient and how to seek care for testing (follow up with PCP or arrange E-visit).  The importance of social distancing was discussed today.  Patient Risk:   After full review of this patients clinical status, I feel that they are at least moderate risk at this time.  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     Medication Adjustments/Labs and Tests Ordered: Current medicines are reviewed at length with the patient today.  Concerns regarding medicines are outlined above.   Tests  Ordered: No orders of the defined types were placed in this encounter.   Medication Changes: No orders of the defined types were placed in this encounter.   Disposition:  in 4 month(s)  Pixie Casino, MD, [facility], [place] Director of the Advanced Lipid Disorders &  Cardiovascular Risk Reduction Clinic Diplomate of the American Board of Clinical Lipidology Attending Cardiologist  Direct Dial: [phone]  Fax: [phone]  Website:  [email or web address]  Pixie Casino, MD  08/21/2020 8:21 AM

## 2020-08-26 ENCOUNTER — Telehealth: Payer: Self-pay | Admitting: Internal Medicine

## 2020-08-26 ENCOUNTER — Encounter: Payer: Self-pay | Admitting: *Deleted

## 2020-08-26 MED ORDER — REPATHA PUSHTRONEX SYSTEM 420 MG/3.5ML ~~LOC~~ SOCT: 1.0000 | SUBCUTANEOUS | 11 refills | Status: DC

## 2020-08-26 NOTE — Telephone Encounter (Signed)
-----   Message from Pixie Casino, MD sent at 08/21/2020 10:46 AM EDT ----- Regarding: [place] This is a new patient- very complex, may have homozygous FH - would like to switch repatha to the 420 mg once monthly infusion - some data that we may be able to increase to 2x monthly. She is already on the twice monthly pen - I think she gets an Materials engineer - will also investigate lomitapide (Juxtapid) for her, but would try the infusion pump first.  DR. Lemmie Evens

## 2020-08-26 NOTE — Telephone Encounter (Signed)
Spoke with patient about change in therapy, per Dr. Lysbeth Penner message. She is agreeable with plan. She presently has Set designer and I explained that the grant has closed their funds for hypercholesterolemia. Explained I am not sure if that will impact her, since she had a pre-existing grant but I will mail her a patient assistance application for [facility] in case she is unable to use the grant funds for Repatha prescription at [facility].   Scheduled her for follow up Dec 14, 2020 with Dr. Debara Pickett and advised will mail her lab order to be completed before her appointment.

## 2020-08-26 NOTE — Progress Notes (Signed)
Completed a phone call/medical record check for visit Treatment 16, Month 42 for the CLEAR research study. Subject is not on IP and is being followed by telephone/medical record checks only. All concomitant medications have been reviewed and updated. No new AE's/SAE's to report to the sponsor. Next phone call/medical record check will be in approximately 3 months.

## 2020-09-15 ENCOUNTER — Telehealth: Payer: Self-pay | Admitting: Internal Medicine

## 2020-09-15 NOTE — Telephone Encounter (Signed)
PA for Repatha [place]rone[place] submitted via CMM Approved 09/15/20 until 12/04/20

## 2020-09-24 ENCOUNTER — Emergency Department (HOSPITAL_BASED_OUTPATIENT_CLINIC_OR_DEPARTMENT_OTHER): Payer: Medicare Other

## 2020-09-24 ENCOUNTER — Emergency Department (HOSPITAL_BASED_OUTPATIENT_CLINIC_OR_DEPARTMENT_OTHER)
Admission: EM | Admit: 2020-09-24 | Discharge: 2020-09-24 | Disposition: A | Discharge status: Home / Self Care | Payer: Medicare Other | Attending: Emergency Medicine | Admitting: Emergency Medicine

## 2020-09-24 ENCOUNTER — Encounter (HOSPITAL_BASED_OUTPATIENT_CLINIC_OR_DEPARTMENT_OTHER): Payer: Self-pay | Admitting: Emergency Medicine

## 2020-09-24 ENCOUNTER — Other Ambulatory Visit: Payer: Self-pay

## 2020-09-24 ENCOUNTER — Other Ambulatory Visit (HOSPITAL_BASED_OUTPATIENT_CLINIC_OR_DEPARTMENT_OTHER): Payer: Self-pay | Admitting: Emergency Medicine

## 2020-09-24 DIAGNOSIS — E039 Hypothyroidism, unspecified: Secondary | ICD-10-CM | POA: Diagnosis not present

## 2020-09-24 DIAGNOSIS — I2510 Atherosclerotic heart disease of native coronary artery without angina pectoris: Secondary | ICD-10-CM | POA: Insufficient documentation

## 2020-09-24 DIAGNOSIS — Z955 Presence of coronary angioplasty implant and graft: Secondary | ICD-10-CM | POA: Insufficient documentation

## 2020-09-24 DIAGNOSIS — Z79890 Hormone replacement therapy (postmenopausal): Secondary | ICD-10-CM | POA: Diagnosis not present

## 2020-09-24 DIAGNOSIS — Z79899 Other long term (current) drug therapy: Secondary | ICD-10-CM | POA: Insufficient documentation

## 2020-09-24 DIAGNOSIS — I10 Essential (primary) hypertension: Secondary | ICD-10-CM | POA: Diagnosis not present

## 2020-09-24 DIAGNOSIS — M79602 Pain in left arm: Secondary | ICD-10-CM | POA: Diagnosis present

## 2020-09-24 DIAGNOSIS — R0789 Other chest pain: Secondary | ICD-10-CM | POA: Diagnosis not present

## 2020-09-24 DIAGNOSIS — M25512 Pain in left shoulder: Secondary | ICD-10-CM | POA: Insufficient documentation

## 2020-09-24 DIAGNOSIS — Z91040 Latex allergy status: Secondary | ICD-10-CM | POA: Insufficient documentation

## 2020-09-24 LAB — CBC
HCT: 33.8 % — ABNORMAL LOW (ref 36.0–46.0)
Hemoglobin: 11.2 g/dL — ABNORMAL LOW (ref 12.0–15.0)
MCH: 32.3 pg (ref 26.0–34.0)
MCHC: 33.1 g/dL (ref 30.0–36.0)
MCV: 97.4 fL (ref 80.0–100.0)
Platelets: 323 10*3/uL (ref 150–400)
RBC: 3.47 MIL/uL — ABNORMAL LOW (ref 3.87–5.11)
RDW: 13.6 % (ref 11.5–15.5)
WBC: 8.6 10*3/uL (ref 4.0–10.5)
nRBC: 0 % (ref 0.0–0.2)

## 2020-09-24 LAB — BASIC METABOLIC PANEL
Anion gap: 9 (ref 5–15)
BUN: 18 mg/dL (ref 8–23)
CO2: 23 mmol/L (ref 22–32)
Calcium: 8.5 mg/dL — ABNORMAL LOW (ref 8.9–10.3)
Chloride: 101 mmol/L (ref 98–111)
Creatinine, Ser: 0.69 mg/dL (ref 0.44–1.00)
GFR, Estimated: >60 mL/min (ref >60)
Glucose, Bld: 120 mg/dL — ABNORMAL HIGH (ref 70–99)
Potassium: 3.7 mmol/L (ref 3.5–5.1)
Sodium: 133 mmol/L — ABNORMAL LOW (ref 135–145)

## 2020-09-24 LAB — TROPONIN I (HIGH SENSITIVITY)
Troponin I (High Sensitivity): 5 ng/L (ref <18)
Troponin I (High Sensitivity): 6 ng/L (ref <18)

## 2020-09-24 MED ORDER — TRAMADOL HCL 50 MG PO TABS
50.0000 mg | ORAL_TABLET | Freq: Four times a day (QID) | ORAL | 0 refills | Status: DC | PRN
Start: 2020-09-24 — End: 2020-10-12

## 2020-09-24 MED FILL — traMADol HCL 50 MG TABS: 50 | 5 days supply | Qty: 20 | Fill #0

## 2020-09-24 NOTE — ED Triage Notes (Signed)
Left chest pain radiating to left arm and left upper back. Shortness of breath.

## 2020-09-24 NOTE — ED Notes (Signed)
ED Provider at bedside. 

## 2020-09-24 NOTE — ED Provider Notes (Signed)
[facility] EMERGENCY DEPARTMENT Provider Note   CSN: [id number] Arrival date & time: 09/24/20  0920     History Chief Complaint  Patient presents with  . Chest Pain    Katherine Hall is a 79 y.o. female.  Patient with a complaint of left arm pain and left shoulder blade pain for a week. Waxes and wanes but is constant. Patient status post CABG in 2017. And and late 2020 had peripheral vascular disease requiring a angioplasty. To her lower extremity. Patient has done well following that. Patient denies a fall. Denies it being worse with movement of her arm for the most part but occasionally may be so. No nausea no vomiting. Patient known to have arthritis of the right shoulder. But that is not bothering her. Patient without any weakness or numbness to the left arm. Good finger strength.        Past Medical History:  Diagnosis Date  . Anemia   . Arthritis   . Gastric ulcer    a. h/o bleeding ulcer 2009 requiring 3 pints of blood.  [name] History of removal of cyst    a. 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  [name] HLD (hyperlipidemia)    a. h/o intolerance of statins, prev on Repatha but insurance stopped covering.  [name] HTN (hypertension)     Patient Active Problem List   Diagnosis Date Noted  . Urinary retention 11/09/2019  . Hematoma 11/08/2019  . Acute blood loss anemia 11/08/2019  . Statin intolerance 11/08/2019  . Tobacco abuse 11/08/2019  . Pre-diabetes 11/08/2019  . PAD (peripheral artery disease) ([place]) 11/06/2019  . Coronary artery disease involving native coronary artery of native heart without angina pectoris 07/26/2017  . Pure hypercholesterolemia with target low density lipoprotein (LDL) cholesterol less than 70 mg/dL 12/12/2016  . 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 Anemia, unspecified 07/05/2016  . Hyponatremia 07/05/2016  . Chest pain 07/05/2016  . Pain in the chest   . Hypothyroidism 07/04/2016  . HTN (hypertension) 07/04/2016    . Hyperlipidemia 07/04/2016  . Unstable angina ([place]) 07/03/2016    Past Surgical History:  Procedure Laterality Date  . ABDOMINAL AORTOGRAM W/LOWER EXTREMITY Bilateral 11/06/2019   Procedure: ABDOMINAL AORTOGRAM W/LOWER EXTREMITY;  Surgeon: Wellington Hampshire, MD;  Location: [facility];  Service: Cardiovascular;  Laterality: Bilateral;  . ABDOMINAL HYSTERECTOMY    . APPENDECTOMY    . CARDIAC CATHETERIZATION N/A 07/05/2016   Procedure: Left Heart Cath and Coronary Angiography;  Surgeon: Burnell Blanks, MD;  Location: [facility];  Service: Cardiovascular;  Laterality: N/A;  . CARDIAC SURGERY     cyst, not on heart.  . CORONARY ARTERY BYPASS GRAFT N/A 07/07/2016   Procedure: CORONARY ARTERY BYPASS GRAFTING (CABG) x 5 with endoscopic harvesting of the right greater saphenous vein;  Surgeon: Gaye Pollack, MD;  Location: [facility];  Service: Open Heart Surgery;  Laterality: N/A;  . HEMORRHOID SURGERY    . INTRAOPERATIVE TRANSESOPHAGEAL ECHOCARDIOGRAM N/A 07/07/2016   Procedure: INTRAOPERATIVE TRANSESOPHAGEAL ECHOCARDIOGRAM;  Surgeon: Gaye Pollack, MD;  Location: [facility];  Service: Open Heart Surgery;  Laterality: N/A;  . PERIPHERAL VASCULAR BALLOON ANGIOPLASTY  11/06/2019   Procedure: PERIPHERAL VASCULAR BALLOON ANGIOPLASTY;  Surgeon: Wellington Hampshire, MD;  Location: [facility];  Service: Cardiovascular;;  cutting and dcb  . SHOULDER SURGERY       OB History   No obstetric history on file.  Family History  Problem Relation Age of Onset  . Stroke Mother        Stroke age 67  . CAD Father        died of MI age 92  . Coronary artery disease Sister        one sister had massive MI at 64, another sister has 42 stents  . Coronary artery disease Brother        one brother had MI at age 90 and died at 82, another has had 4 MIs and 2 bypasses    Social History   Tobacco Use  . Smoking status: Never Smoker  . Smokeless tobacco: Never Used  Vaping Use  .  Vaping Use: Never used  Substance Use Topics  . Alcohol use: No  . Drug use: No    Home Medications Prior to Admission medications   Medication Sig Start Date End Date Taking? Authorizing Provider  acetaminophen (TYLENOL) 650 MG CR tablet Take 1,300 mg by mouth daily.    [provider]  acyclovir (ZOVIRAX) 400 MG tablet Take 800 mg by mouth daily as needed (fever blisters).     [provider]  alendronate (FOSAMAX) 70 MG tablet Take 70 mg by mouth every Saturday.  10/16/18   [provider]  clopidogrel (PLAVIX) 75 MG tablet Take 1 tablet (75 mg total) by mouth daily. 03/12/20   Nahser, Wonda Cheng, MD  diphenhydramine-acetaminophen (TYLENOL PM) 25-500 MG TABS tablet Take 2 tablets by mouth at bedtime as needed (sleep).    [provider]  Evolocumab with Infusor ([place]) 420 MG/3.5ML SOCT Inject 1 Dose into the skin every 30 (thirty) days. 08/26/20   Pixie Casino, MD  ferrous sulfate 325 (65 FE) MG tablet Take 325 mg by mouth every Monday, Wednesday, and Friday.    [provider]  fluticasone (FLONASE) 50 MCG/ACT nasal spray Place 2 sprays into both nostrils daily as needed for allergies.  02/22/18   [provider]  gatifloxacin (ZYMAXID) 0.5 % SOLN Place 1 drop into the left eye 2 (two) times daily. 01/13/20   [provider]  levothyroxine (SYNTHROID, LEVOTHROID) 50 MCG tablet Take 50 mcg by mouth daily. 03/31/16   [provider]  lisinopril (ZESTRIL) 20 MG tablet Take 1 tablet (20 mg total) by mouth 2 (two) times daily. 03/12/20   Nahser, Wonda Cheng, MD  nitroGLYCERIN (NITROSTAT) 0.4 MG SL tablet Place 1 tablet (0.4 mg total) under the tongue every 5 (five) minutes x 3 doses as needed for chest pain. 11/09/19   Sande Rives E, PA-C  pantoprazole (PROTONIX) 40 MG tablet Take 40 mg by mouth 2 (two) times daily.  06/11/18   [provider]  rOPINIRole (REQUIP) 2 MG tablet Take 2 mg by mouth at  bedtime.    [provider]  traMADol (ULTRAM) 50 MG tablet Take 1 tablet (50 mg total) by mouth every 6 (six) hours as needed for severe pain. 07/13/16   Nani Skillern, PA-C  traMADol (ULTRAM) 50 MG tablet Take 1 tablet (50 mg total) by mouth every 6 (six) hours as needed. 09/24/20   Fredia Sorrow, MD    Allergies    Penicillins, Lincomycin, Naproxen sodium, Statins, Latex, and Tape  Review of Systems   Review of Systems  Constitutional: Negative for chills and fever.  HENT: Negative for congestion, rhinorrhea and sore throat.   Eyes: Negative for visual disturbance.  Respiratory: Negative for cough and shortness of breath.  Cardiovascular: Positive for chest pain. Negative for leg swelling.  Gastrointestinal: Negative for abdominal pain, diarrhea, nausea and vomiting.  Genitourinary: Negative for dysuria.  Musculoskeletal: Negative for back pain and neck pain.  Skin: Negative for rash.  Neurological: Negative for dizziness, light-headedness and headaches.  Hematological: Does not bruise/bleed easily.  Psychiatric/Behavioral: Negative for confusion.    Physical Exam Updated Vital Signs BP (!) 187/83   Pulse 76   Temp 98.1 F (36.7 C)   Resp (!) 21   SpO2 99%   Physical Exam Vitals and nursing note reviewed.  Constitutional:      General: She is not in acute distress.    Appearance: Normal appearance. She is well-developed.  HENT:     Head: Normocephalic and atraumatic.  Eyes:     Extraocular Movements: Extraocular movements intact.     Conjunctiva/sclera: Conjunctivae normal.     Pupils: Pupils are equal, round, and reactive to light.  Cardiovascular:     Rate and Rhythm: Normal rate and regular rhythm.     Heart sounds: No murmur heard.   Pulmonary:     Effort: Pulmonary effort is normal. No respiratory distress.     Breath sounds: Normal breath sounds.  Abdominal:     Palpations: Abdomen is soft.     Tenderness: There is no abdominal  tenderness.  Musculoskeletal:        General: No swelling, tenderness or deformity.     Cervical back: Normal range of motion and neck supple.     Comments: Radial pulses 2+.  Skin:    General: Skin is warm and dry.  Neurological:     General: No focal deficit present.     Mental Status: She is alert and oriented to person, place, and time.     Cranial Nerves: No cranial nerve deficit.     Sensory: No sensory deficit.     Motor: No weakness.     ED Results / Procedures / Treatments   Labs (all labs ordered are listed, but only abnormal results are displayed) Labs Reviewed  BASIC METABOLIC PANEL - Abnormal; Notable for the following components:      Result Value   Sodium 133 (*)    Glucose, Bld 120 (*)    Calcium 8.5 (*)    All other components within normal limits  CBC - Abnormal; Notable for the following components:   RBC 3.47 (*)    Hemoglobin 11.2 (*)    HCT 33.8 (*)    All other components within normal limits  TROPONIN I (HIGH SENSITIVITY)  TROPONIN I (HIGH SENSITIVITY)    EKG EKG Interpretation  Date/Time:  Thursday September 24 2020 09:41:22 EDT Ventricular Rate:  79 PR Interval:    QRS Duration: 93 QT Interval:  389 QTC Calculation: 446 R Axis:   64 Text Interpretation: Sinus rhythm Confirmed by Fredia Sorrow [id number]) on 09/24/2020 10:05:47 AM   Radiology DG Chest 2 View  Result Date: 09/24/2020 CLINICAL DATA:  Chest pain. Additional history provided: Left chest pain radiating to left arm and left upper back, shortness of breath. EXAM: CHEST - 2 VIEW COMPARISON:  Chest radiographs 08/24/2016. FINDINGS: Prior median sternotomy/CABG. Heart size within normal limits. Aortic atherosclerosis. No appreciable airspace consolidation or pulmonary edema. No significant pleural effusion is appreciable. No evidence of pneumothorax. No acute bony abnormality identified. Prominent right glenohumeral joint arthrosis. IMPRESSION: No evidence of acute cardiopulmonary  abnormality. Aortic Atherosclerosis (ICD10-I70.0). Prior CABG. Incidentally noted prominent right glenohumeral joint arthrosis. Electronically Signed  By: Kellie Simmering DO   On: 09/24/2020 10:14    Procedures Procedures (including critical care time)  Medications Ordered in ED Medications - No data to display  ED Course  I have reviewed the triage vital signs and the nursing notes.  Pertinent labs & imaging results that were available during my care of the patient were reviewed by me and considered in my medical decision making (see chart for details).    MDM Rules/Calculators/A&P                         Patient with extensive work-up here troponins x2 without any significant change. Her blood pressure is elevated she has a history of hypertension. This could be affected by the pain in the left arm area. Chest x-ray without any acute findings in the shoulder on the left side looks normal. Oxygen saturations good EKG without any acute changes. No evidence of any significant cardiac cause for the chest pain. Particularly after a weeks worth of the pain. May be musculoskeletal. But not necessarily worse with movement. Will treat with a sling have her follow-up with her primary care doctor regarding the blood pressure. Have her follow-up with her cardiologist regarding the chest pain. Patient does have orthopedics to follow-up as well. Patient normally gets prescription of tramadol. Last one was September 3 for 30 days. So that has expired. Tramadol will be renewed. For the pain.  Final Clinical Impression(s) / ED Diagnoses Final diagnoses:  Atypical chest pain  Acute pain of left shoulder  Primary hypertension    Rx / DC Orders ED Discharge Orders         Ordered    traMADol (ULTRAM) 50 MG tablet  Every 6 hours PRN        09/24/20 1256           Fredia Sorrow, MD 09/24/20 1304

## 2020-09-24 NOTE — Discharge Instructions (Addendum)
Work-up today for the chest pain without any acute findings.  Could be musculoskeletal try the sling and make an appointment to follow-up with orthopedics.  Take the pain medicine as directed.  Also your blood pressure was elevated today would recommend following up with your regular doctor to trend that.  May require some adjustments. Also follow-up with cardiology.

## 2020-09-25 ENCOUNTER — Telehealth: Payer: Self-pay | Admitting: Internal Medicine

## 2020-09-25 NOTE — Telephone Encounter (Signed)
repatha patient assistance application faxed to [employer] @ [phone]

## 2020-10-11 ENCOUNTER — Encounter: Payer: Self-pay | Admitting: Cardiovascular Disease

## 2020-10-11 NOTE — Progress Notes (Signed)
Cardiology Office Note   Date:  10/12/2020   ID:  Katherine Hall, DOB [birth year], MRN [id number]  PCP:  Stacie Glaze, DO  Cardiologist:   Mertie Moores, MD , [facility]   Chief Complaint  Patient presents with  . Coronary Artery Disease  . Hypertension   Problem list 1. Coronary artery disease-status post coronary artery bypass grafting-  CORONARY ARTERY BYPASS GRAFTING x 5 -LIMA to LAD -SVG to DIAGONAL -SVG to PDA -SEQ SVG OM1 and OM2  2. Hypertension 3. Hyperlipidemia 4. Anemia     Katherine Hall is a 79 y.o. female who presents for follow-up of her coronary artery disease. She had coronary artery bypass grafting in August. 2017   She has had a moderate left pleural effusion Still gradually improving . Needs an order to do cardiac rehab.  [facility]   Breathing is better.  Still has some chest soreness,  The angina is better   Has very high lipids.   Cannot tolerate statins.  Has seen [facility] to initiate Repatha - working on that  Still sleeping in a recliner  Jan. 8, 2018:  Katherine Hall is doing well. She had coronary artery bypass grafting in August, 2017: Is not doing cardiac rehab.  Has lots of leg pain  Needs to have surgery on her right knee Has SVG harvest site pain .  Does not take her BP regularly  Aug. 22, 2018:  Katherine Hall is still having some chest pains. Described as a discomfort.  Last 5 minutes. She does not know if it worsens with twisting or taking a deep breath  Is not exercising ,.   May 01, 2018: Seen for follow-up of her coronary artery disease.  She is status post coronary artery bypass grafting in August, 2017.  Also has a history of hypertension and hyperlipidemia. No cp.   Not exercising much  Has PVD,   Had a coated stent placed.  Complicated by a significant groin bleed Required transfusion of 2 u PRBS   Some various aches and pains .  Has some left neck pain .   Lasts for a few seconds.  Has had  right  knee surgery since I last saw her.   No CP or dyspnea , Gets fatigued easily . Does not sleep well   Dec. 2, 2019:  Not exercising  Leg pain  No CP  Has arthrisit and PAD  Her Lipids are very high. Did well with Repatha but she couldn't afford it  Is in the Clear trial  But couldn't tolerate the Bempidoic acid    Aug. 25, 2020  Doing well Has some pain in her left leg.  Pain increases with waking  Concerned with PAD  Is in a cholesterol study so we do not have chol levels since 2018.   March 12, 2020:  Katherine Hall  was seen today for a follow-up of her hyperlipidemia, coronary artery disease, leg pain. Lipids are still elevated.    Nov. 8, 2021: Katherine Hall was seen today for follow up of her HLD, CAD, PAD  Has been referred to Lipid clinic and has seen Dr. Debara Pickett. BP has been elevated.   BP readings at home have been elevated. I reviewed her BP log.     Went to the ER with CP , left shoulder pain . BP was elevated  She is on hydrochlorothiazide 12.5 mg a day and lisinopril 20 mg twice a day.  I reviewed her  diet.   Does not eat much salt.  .  Lipids have continued to be elevated.   She is seeing Dr. Debara Pickett and is working on getting into a study for a higher dose Repatha once a month.    Past Medical History:  Diagnosis Date  . Anemia   . Arthritis   . Gastric ulcer    a. h/o bleeding ulcer 2009 requiring 3 pints of blood.  [name] History of removal of cyst    a. 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  [name] HLD (hyperlipidemia)    a. h/o intolerance of statins, prev on Repatha but insurance stopped covering.  [name] HTN (hypertension)     Past Surgical History:  Procedure Laterality Date  . ABDOMINAL AORTOGRAM W/LOWER EXTREMITY Bilateral 11/06/2019   Procedure: ABDOMINAL AORTOGRAM W/LOWER EXTREMITY;  Surgeon: Wellington Hampshire, MD;  Location: [facility];  Service: Cardiovascular;  Laterality: Bilateral;  . ABDOMINAL HYSTERECTOMY    .  APPENDECTOMY    . CARDIAC CATHETERIZATION N/A 07/05/2016   Procedure: Left Heart Cath and Coronary Angiography;  Surgeon: Burnell Blanks, MD;  Location: [facility];  Service: Cardiovascular;  Laterality: N/A;  . CARDIAC SURGERY     cyst, not on heart.  . CORONARY ARTERY BYPASS GRAFT N/A 07/07/2016   Procedure: CORONARY ARTERY BYPASS GRAFTING (CABG) x 5 with endoscopic harvesting of the right greater saphenous vein;  Surgeon: Gaye Pollack, MD;  Location: [facility] OR;  Service: Open Heart Surgery;  Laterality: N/A;  . HEMORRHOID SURGERY    . INTRAOPERATIVE TRANSESOPHAGEAL ECHOCARDIOGRAM N/A 07/07/2016   Procedure: INTRAOPERATIVE TRANSESOPHAGEAL ECHOCARDIOGRAM;  Surgeon: Gaye Pollack, MD;  Location: [facility] OR;  Service: Open Heart Surgery;  Laterality: N/A;  . PERIPHERAL VASCULAR BALLOON ANGIOPLASTY  11/06/2019   Procedure: PERIPHERAL VASCULAR BALLOON ANGIOPLASTY;  Surgeon: Wellington Hampshire, MD;  Location: [facility];  Service: Cardiovascular;;  cutting and dcb  . SHOULDER SURGERY       Current Outpatient Medications  Medication Sig Dispense Refill  . acetaminophen (TYLENOL) 650 MG CR tablet Take 1,300 mg by mouth daily.    [name] acyclovir (ZOVIRAX) 400 MG tablet Take 800 mg by mouth daily as needed (fever blisters).     [name] alendronate (FOSAMAX) 70 MG tablet Take 70 mg by mouth every Saturday.     . clopidogrel (PLAVIX) 75 MG tablet Take 1 tablet (75 mg total) by mouth daily. 90 tablet 3  . diphenhydrAMINE HCl (BENADRYL ALLERGY PO) Take 1 tablet by mouth daily as needed.    . diphenhydramine-acetaminophen (TYLENOL PM) 25-500 MG TABS tablet Take 2 tablets by mouth at bedtime as needed (sleep).    . Evolocumab with Infusor ([place]) 420 MG/3.5ML SOCT Inject 1 Dose into the skin every 30 (thirty) days. 3.6 mL 11  . ferrous sulfate 325 (65 FE) MG tablet Take 325 mg by mouth every Monday, Wednesday, and Friday.    . fluticasone (FLONASE) 50 MCG/ACT nasal spray Place 2  sprays into both nostrils daily as needed for allergies.     [name] gatifloxacin (ZYMAXID) 0.5 % SOLN Place 1 drop into the left eye 2 (two) times daily.    [name] levothyroxine (SYNTHROID, LEVOTHROID) 50 MCG tablet Take 50 mcg by mouth daily.    [name] lisinopril (ZESTRIL) 20 MG tablet Take 1 tablet (20 mg total) by mouth 2 (two) times daily. 180 tablet 3  . nitroGLYCERIN (NITROSTAT) 0.4 MG SL tablet Place  1 tablet (0.4 mg total) under the tongue every 5 (five) minutes x 3 doses as needed for chest pain. 25 tablet 1  . pantoprazole (PROTONIX) 40 MG tablet Take 40 mg by mouth 2 (two) times daily.     [name] rOPINIRole (REQUIP) 2 MG tablet Take 2 mg by mouth at bedtime.    . traMADol (ULTRAM) 50 MG tablet Take 1 tablet (50 mg total) by mouth every 6 (six) hours as needed for severe pain. 28 tablet 0  . hydrochlorothiazide (HYDRODIURIL) 25 MG tablet Take 1 tablet (25 mg total) by mouth daily. 90 tablet 3  . potassium chloride (KLOR-CON) 10 MEQ tablet Take 1 tablet (10 mEq total) by mouth daily. 180 tablet 3   No current facility-administered medications for this visit.    Allergies:   Penicillins, Lincomycin, Naproxen sodium, Statins, Latex, and Tape    Social History:  The patient  reports that she has never smoked. She has never used smokeless tobacco. She reports that she does not drink alcohol and does not use drugs.   Family History:  The patient's family history includes CAD in her father; Coronary artery disease in her brother and sister; Stroke in her mother.    ROS:   Noted in current history, otherwise review of systems is negative.   Physical Exam: Blood pressure (!) 148/76, pulse 76, height 4\' 10"  (1.473 m), weight 125 lb 12.8 oz (57.1 kg), SpO2 99 %.  GEN:  Well nourished, well developed in no acute distress HEENT: Normal NECK: No JVD; No carotid bruits LYMPHATICS: No lymphadenopathy CARDIAC: RRR , no murmurs, rubs, gallops RESPIRATORY:  Clear to auscultation without rales, wheezing or  rhonchi  ABDOMEN: Soft, non-tender, non-distended MUSCULOSKELETAL:  No edema; No deformity  SKIN: Warm and dry NEUROLOGIC:  Alert and oriented x 3   Recent Labs: 06/18/2020: ALT 9 09/24/2020: BUN 18; Creatinine, Ser 0.69; Hemoglobin 11.2; Platelets 323; Potassium 3.7; Sodium 133    Lipid Panel    Component Value Date/Time   CHOL 338 (H) 06/18/2020 1039   TRIG 121 06/18/2020 1039   HDL 73 06/18/2020 1039   CHOLHDL 6.5 (H) 03/12/2020 1011   CHOLHDL 4.6 11/08/2019 0404   VLDL 21 11/08/2019 0404   LDLCALC 244 (H) 06/18/2020 1039      Wt Readings from Last 3 Encounters:  10/12/20 125 lb 12.8 oz (57.1 kg)  08/21/20 123 lb (55.8 kg)  07/07/20 123 lb 9.6 oz (56.1 kg)      Other studies Reviewed: Additional studies/ records that were reviewed today include: . Review of the above records demonstrates:    ASSESSMENT AND PLAN:  1. Coronary artery disease: She had some atypical chest pain shoulder pain recently.  We will get a Lexiscan Myoview study.      2. Pure  Hyperlipidemia :   She is been seeing Dr. Debara Pickett.  They are working on enrolling her in a study medication.     3.  Peripheral arterial disease:     3. HTN:     .  Blood pressure is moderately elevated.  We will increase the HCTZ to 25 mg a day and add potassium chloride 10 mEq twice a day.  We will recheck a basic metabolic profile in 3 weeks.  She will follow-up with an APP in 2 to 3 months.   4. Fatigue:      Current medicines are reviewed at length with the patient today.  The patient does not have concerns regarding medicines.  Labs/  tests ordered today include:   Orders Placed This Encounter  Procedures  . Basic Metabolic Panel (BMET)  . Myocardial Perfusion Imaging     Disposition:   FU with an APP  in 6 months      Mertie Moores, MD  10/12/2020 5:03 PM    [facility] [address], [place], [place]  [address] Phone: [phone]; Fax: [phone]

## 2020-10-12 ENCOUNTER — Encounter: Payer: Self-pay | Admitting: Cardiovascular Disease

## 2020-10-12 ENCOUNTER — Ambulatory Visit: Payer: Medicare Other | Admitting: Cardiovascular Disease

## 2020-10-12 ENCOUNTER — Other Ambulatory Visit: Payer: Self-pay

## 2020-10-12 VITALS — BP 148/76 | HR 76 | Ht <= 58 in | Wt 125.8 lb

## 2020-10-12 DIAGNOSIS — I2510 Atherosclerotic heart disease of native coronary artery without angina pectoris: Secondary | ICD-10-CM

## 2020-10-12 DIAGNOSIS — Z79899 Other long term (current) drug therapy: Secondary | ICD-10-CM

## 2020-10-12 MED ORDER — POTASSIUM CHLORIDE ER 10 MEQ PO TBCR: 10.0000 meq | EXTENDED_RELEASE_TABLET | Freq: Every day | ORAL | 3 refills | Status: DC

## 2020-10-12 MED ORDER — HYDROCHLOROTHIAZIDE 25 MG PO TABS: 25.0000 mg | ORAL_TABLET | Freq: Every day | ORAL | 3 refills | Status: DC

## 2020-10-12 NOTE — Patient Instructions (Signed)
Medication Instructions:  Your physician has recommended you make the following change in your medication:  INCREASE YOUR HCTZ TO 25MG  A DAY  START KDUR 10 MEQ TWICE A DAY   *If you need a refill on your cardiac medications before your next appointment, please call your pharmacy*   Lab Work: BMET IN 3 WEEKS   If you have labs (blood work) drawn today and your tests are completely normal, you will receive your results only by: [name] MyChart Message (if you have MyChart) OR . A paper copy in the mail If you have any lab test that is abnormal or we need to change your treatment, we will call you to review the results.   Testing/Procedures: Your physician has requested that you have a lexiscan myoview. For further information please visit [email or web address]. Please follow instruction sheet, as given.     Follow-Up: At [facility], you and your health needs are our priority.  As part of our continuing mission to provide you with exceptional heart care, we have created designated Provider Care Teams.  These Care Teams include your primary Cardiologist (physician) and Advanced Practice Providers (APPs -  Physician Assistants and Nurse Practitioners) who all work together to provide you with the care you need, when you need it.  We recommend signing up for the patient portal called "MyChart".  Sign up information is provided on this After Visit Summary.  MyChart is used to connect with patients for Virtual Visits (Telemedicine).  Patients are able to view lab/test results, encounter notes, upcoming appointments, etc.  Non-urgent messages can be sent to your provider as well.   To learn more about what you can do with MyChart, go to [email or web address].    Your next appointment:   3 month(s)  The format for your next appointment:   In Person  Provider:   You will see one of the following Advanced Practice Providers on your designated Care Team:    Richardson Dopp, PA-C  Robbie Lis,  [place]     Other Instructions

## 2020-10-13 NOTE — Telephone Encounter (Signed)
Patient has been approved to receive Repatha free of charge from [employer] from 10/12/2020 - 12/04/2020

## 2020-10-27 ENCOUNTER — Telehealth (HOSPITAL_COMMUNITY): Payer: Self-pay | Admitting: *Deleted

## 2020-10-27 NOTE — Telephone Encounter (Signed)
Patient given detailed instructions per Myocardial Perfusion Study Information Sheet for the test on 11/02/20 at 10:15. Patient notified to arrive 15 minutes early and that it is imperative to arrive on time for appointment to keep from having the test rescheduled.  If you need to cancel or reschedule your appointment, please call the office within 24 hours of your appointment. . Patient verbalized understanding.Katherine Hall

## 2020-11-02 ENCOUNTER — Other Ambulatory Visit: Payer: Medicare Other | Admitting: *Deleted

## 2020-11-02 ENCOUNTER — Ambulatory Visit (HOSPITAL_COMMUNITY): Payer: Medicare Other | Attending: Cardiovascular Disease

## 2020-11-02 ENCOUNTER — Other Ambulatory Visit: Payer: Self-pay

## 2020-11-02 DIAGNOSIS — Z79899 Other long term (current) drug therapy: Secondary | ICD-10-CM | POA: Insufficient documentation

## 2020-11-02 DIAGNOSIS — I2510 Atherosclerotic heart disease of native coronary artery without angina pectoris: Secondary | ICD-10-CM | POA: Diagnosis present

## 2020-11-02 LAB — MYOCARDIAL PERFUSION IMAGING
LV dias vol: 54 mL (ref 46–106)
LV sys vol: 12 mL
Peak HR: 80 {beats}/min
Rest HR: 67 {beats}/min
SDS: 4
SRS: 0
SSS: 4
TID: 0.96

## 2020-11-02 LAB — BASIC METABOLIC PANEL
BUN/Creatinine Ratio: 25 (ref 12–28)
BUN: 21 mg/dL (ref 8–27)
CO2: 24 mmol/L (ref 20–29)
Calcium: 9.1 mg/dL (ref 8.7–10.3)
Chloride: 96 mmol/L (ref 96–106)
Creatinine, Ser: 0.83 mg/dL (ref 0.57–1.00)
GFR calc Af Amer: 78 mL/min/{1.73_m2} (ref >59)
GFR calc non Af Amer: 67 mL/min/{1.73_m2} (ref >59)
Glucose: 93 mg/dL (ref 65–99)
Potassium: 4.7 mmol/L (ref 3.5–5.2)
Sodium: 131 mmol/L — ABNORMAL LOW (ref 134–144)

## 2020-11-02 MED ORDER — TECHNETIUM TC 99M TETROFOSMIN IV KIT
10.1000 | PACK | Freq: Once | INTRAVENOUS | Status: AC | PRN
  Administered 2020-11-02: 10.1 via INTRAVENOUS
  Filled 2020-11-02: qty 11

## 2020-11-02 MED ORDER — TECHNETIUM TC 99M TETROFOSMIN IV KIT
32.3000 | PACK | Freq: Once | INTRAVENOUS | Status: AC | PRN
  Administered 2020-11-02: 32.3 via INTRAVENOUS
  Filled 2020-11-02: qty 33

## 2020-11-02 MED ORDER — REGADENOSON 0.4 MG/5ML IV SOLN
0.4000 mg | Freq: Once | INTRAVENOUS | Status: AC
  Administered 2020-11-02: 0.4 mg via INTRAVENOUS

## 2020-11-05 ENCOUNTER — Other Ambulatory Visit: Payer: Self-pay | Admitting: Internal Medicine

## 2020-11-05 ENCOUNTER — Other Ambulatory Visit: Payer: Self-pay | Admitting: *Deleted

## 2020-11-05 DIAGNOSIS — E7801 Familial hypercholesterolemia: Secondary | ICD-10-CM

## 2020-11-05 DIAGNOSIS — E785 Hyperlipidemia, unspecified: Secondary | ICD-10-CM

## 2020-12-02 ENCOUNTER — Telehealth: Payer: Self-pay | Admitting: *Deleted

## 2020-12-02 NOTE — Telephone Encounter (Signed)
Completed phone call/chart review for the CLEAR research study visit T17, M45. Subject is currently in study on telephone calls only and is not taking study IP. All concomitant medications have been reviewed and updated if applicable. No new AE's or SAE's to report to sponsor at this time. Next phone call/chart review will be in approximately 3 months around end of March 2022.

## 2020-12-11 LAB — LIPID PANEL
Chol/HDL Ratio: 5 ratio — ABNORMAL HIGH (ref 0.0–4.4)
Cholesterol, Total: 356 mg/dL — ABNORMAL HIGH (ref 100–199)
HDL: 71 mg/dL (ref >39)
LDL Chol Calc (NIH): 243 mg/dL — ABNORMAL HIGH (ref 0–99)
Triglycerides: 213 mg/dL — ABNORMAL HIGH (ref 0–149)
VLDL Cholesterol Cal: 42 mg/dL — ABNORMAL HIGH (ref 5–40)

## 2020-12-14 ENCOUNTER — Ambulatory Visit: Payer: Medicare Other | Admitting: Internal Medicine

## 2020-12-16 ENCOUNTER — Encounter: Payer: Self-pay | Admitting: Internal Medicine

## 2021-01-12 ENCOUNTER — Ambulatory Visit: Payer: Medicare Other | Admitting: Physician Assistant

## 2021-02-02 ENCOUNTER — Encounter (HOSPITAL_BASED_OUTPATIENT_CLINIC_OR_DEPARTMENT_OTHER): Payer: Self-pay | Admitting: Emergency Medicine

## 2021-02-02 ENCOUNTER — Inpatient Hospital Stay (HOSPITAL_BASED_OUTPATIENT_CLINIC_OR_DEPARTMENT_OTHER)
Admission: EM | Admit: 2021-02-02 | Discharge: 2021-02-05 | DRG: 251 | Disposition: A | Discharge status: Home / Self Care | Payer: Medicare Other | Attending: Cardiology | Admitting: Cardiology

## 2021-02-02 ENCOUNTER — Emergency Department (HOSPITAL_BASED_OUTPATIENT_CLINIC_OR_DEPARTMENT_OTHER): Payer: Medicare Other

## 2021-02-02 ENCOUNTER — Other Ambulatory Visit: Payer: Self-pay

## 2021-02-02 DIAGNOSIS — Z880 Allergy status to penicillin: Secondary | ICD-10-CM

## 2021-02-02 DIAGNOSIS — Z91040 Latex allergy status: Secondary | ICD-10-CM

## 2021-02-02 DIAGNOSIS — I25110 Atherosclerotic heart disease of native coronary artery with unstable angina pectoris: Principal | ICD-10-CM | POA: Diagnosis present

## 2021-02-02 DIAGNOSIS — E039 Hypothyroidism, unspecified: Secondary | ICD-10-CM | POA: Diagnosis present

## 2021-02-02 DIAGNOSIS — Z886 Allergy status to analgesic agent status: Secondary | ICD-10-CM

## 2021-02-02 DIAGNOSIS — Z8249 Family history of ischemic heart disease and other diseases of the circulatory system: Secondary | ICD-10-CM

## 2021-02-02 DIAGNOSIS — R7303 Prediabetes: Secondary | ICD-10-CM | POA: Diagnosis present

## 2021-02-02 DIAGNOSIS — Z7902 Long term (current) use of antithrombotics/antiplatelets: Secondary | ICD-10-CM

## 2021-02-02 DIAGNOSIS — E7800 Pure hypercholesterolemia, unspecified: Secondary | ICD-10-CM | POA: Diagnosis not present

## 2021-02-02 DIAGNOSIS — E785 Hyperlipidemia, unspecified: Secondary | ICD-10-CM | POA: Diagnosis present

## 2021-02-02 DIAGNOSIS — I200 Unstable angina: Secondary | ICD-10-CM | POA: Diagnosis not present

## 2021-02-02 DIAGNOSIS — Z951 Presence of aortocoronary bypass graft: Secondary | ICD-10-CM

## 2021-02-02 DIAGNOSIS — Z789 Other specified health status: Secondary | ICD-10-CM | POA: Diagnosis present

## 2021-02-02 DIAGNOSIS — Z91048 Other nonmedicinal substance allergy status: Secondary | ICD-10-CM

## 2021-02-02 DIAGNOSIS — Z7983 Long term (current) use of bisphosphonates: Secondary | ICD-10-CM

## 2021-02-02 DIAGNOSIS — R079 Chest pain, unspecified: Secondary | ICD-10-CM | POA: Diagnosis present

## 2021-02-02 DIAGNOSIS — I25119 Atherosclerotic heart disease of native coronary artery with unspecified angina pectoris: Secondary | ICD-10-CM | POA: Diagnosis present

## 2021-02-02 DIAGNOSIS — Z20822 Contact with and (suspected) exposure to covid-19: Secondary | ICD-10-CM | POA: Diagnosis present

## 2021-02-02 DIAGNOSIS — Z79890 Hormone replacement therapy (postmenopausal): Secondary | ICD-10-CM

## 2021-02-02 DIAGNOSIS — Z888 Allergy status to other drugs, medicaments and biological substances status: Secondary | ICD-10-CM

## 2021-02-02 DIAGNOSIS — I739 Peripheral vascular disease, unspecified: Secondary | ICD-10-CM | POA: Diagnosis present

## 2021-02-02 DIAGNOSIS — I2510 Atherosclerotic heart disease of native coronary artery without angina pectoris: Secondary | ICD-10-CM | POA: Diagnosis present

## 2021-02-02 DIAGNOSIS — Z7982 Long term (current) use of aspirin: Secondary | ICD-10-CM

## 2021-02-02 DIAGNOSIS — Z883 Allergy status to other anti-infective agents status: Secondary | ICD-10-CM

## 2021-02-02 DIAGNOSIS — M79604 Pain in right leg: Secondary | ICD-10-CM

## 2021-02-02 DIAGNOSIS — I10 Essential (primary) hypertension: Secondary | ICD-10-CM | POA: Diagnosis present

## 2021-02-02 DIAGNOSIS — Z79899 Other long term (current) drug therapy: Secondary | ICD-10-CM

## 2021-02-02 DIAGNOSIS — I358 Other nonrheumatic aortic valve disorders: Secondary | ICD-10-CM | POA: Diagnosis present

## 2021-02-02 HISTORY — DX: Disorder of thyroid, unspecified: E07.9

## 2021-02-02 HISTORY — DX: Atherosclerotic heart disease of native coronary artery without angina pectoris: I25.10

## 2021-02-02 LAB — BASIC METABOLIC PANEL
Anion gap: 10 (ref 5–15)
BUN: 20 mg/dL (ref 8–23)
CO2: 26 mmol/L (ref 22–32)
Calcium: 9.1 mg/dL (ref 8.9–10.3)
Chloride: 97 mmol/L — ABNORMAL LOW (ref 98–111)
Creatinine, Ser: 0.92 mg/dL (ref 0.44–1.00)
GFR, Estimated: >60 mL/min (ref >60)
Glucose, Bld: 117 mg/dL — ABNORMAL HIGH (ref 70–99)
Potassium: 4.2 mmol/L (ref 3.5–5.1)
Sodium: 133 mmol/L — ABNORMAL LOW (ref 135–145)

## 2021-02-02 LAB — CBC WITH DIFFERENTIAL/PLATELET
Abs Immature Granulocytes: 0.02 10*3/uL (ref 0.00–0.07)
Basophils Absolute: 0.1 10*3/uL (ref 0.0–0.1)
Basophils Relative: 1 %
Eosinophils Absolute: 0.3 10*3/uL (ref 0.0–0.5)
Eosinophils Relative: 6 %
HCT: 35.9 % — ABNORMAL LOW (ref 36.0–46.0)
Hemoglobin: 11.9 g/dL — ABNORMAL LOW (ref 12.0–15.0)
Immature Granulocytes: 0 %
Lymphocytes Relative: 23 %
Lymphs Abs: 1.4 10*3/uL (ref 0.7–4.0)
MCH: 31.7 pg (ref 26.0–34.0)
MCHC: 33.1 g/dL (ref 30.0–36.0)
MCV: 95.7 fL (ref 80.0–100.0)
Monocytes Absolute: 0.4 10*3/uL (ref 0.1–1.0)
Monocytes Relative: 7 %
Neutro Abs: 3.7 10*3/uL (ref 1.7–7.7)
Neutrophils Relative %: 63 %
Platelets: 342 10*3/uL (ref 150–400)
RBC: 3.75 MIL/uL — ABNORMAL LOW (ref 3.87–5.11)
RDW: 13.3 % (ref 11.5–15.5)
WBC: 5.9 10*3/uL (ref 4.0–10.5)
nRBC: 0 % (ref 0.0–0.2)

## 2021-02-02 LAB — TSH: TSH: 0.96 u[IU]/mL (ref 0.350–4.500)

## 2021-02-02 LAB — HEMOGLOBIN A1C
Hgb A1c MFr Bld: 6.2 % — ABNORMAL HIGH (ref 4.8–5.6)
Mean Plasma Glucose: 131.24 mg/dL

## 2021-02-02 LAB — RESP PANEL BY RT-PCR (FLU A&B, COVID) ARPGX2
Influenza A by PCR: NEGATIVE
Influenza B by PCR: NEGATIVE
SARS Coronavirus 2 by RT PCR: NEGATIVE

## 2021-02-02 LAB — TROPONIN I (HIGH SENSITIVITY)
Troponin I (High Sensitivity): 4 ng/L (ref <18)
Troponin I (High Sensitivity): 5 ng/L (ref <18)

## 2021-02-02 MED ORDER — DIPHENHYDRAMINE HCL 25 MG PO CAPS
50.0000 mg | ORAL_CAPSULE | Freq: Every evening | ORAL | Status: DC | PRN
  Administered 2021-02-03 – 2021-02-04 (×2): 50 mg via ORAL
  Filled 2021-02-02 (×2): qty 2

## 2021-02-02 MED ORDER — ACETAMINOPHEN 500 MG PO TABS
1000.0000 mg | ORAL_TABLET | Freq: Every evening | ORAL | Status: DC | PRN
  Administered 2021-02-04: 1000 mg via ORAL
  Filled 2021-02-02: qty 2

## 2021-02-02 MED ORDER — PANTOPRAZOLE SODIUM 20 MG PO TBEC
20.0000 mg | DELAYED_RELEASE_TABLET | Freq: Two times a day (BID) | ORAL | Status: DC
  Administered 2021-02-02 – 2021-02-05 (×6): 20 mg via ORAL
  Filled 2021-02-02 (×7): qty 1

## 2021-02-02 MED ORDER — ASPIRIN 81 MG PO CHEW
162.0000 mg | CHEWABLE_TABLET | Freq: Once | ORAL | Status: AC
  Administered 2021-02-02: 162 mg via ORAL
  Filled 2021-02-02: qty 2

## 2021-02-02 MED ORDER — ZOLPIDEM TARTRATE 5 MG PO TABS
5.0000 mg | ORAL_TABLET | Freq: Every evening | ORAL | Status: DC | PRN
  Administered 2021-02-02: 21:00:00 5 mg via ORAL
  Filled 2021-02-02: qty 1

## 2021-02-02 MED ORDER — SODIUM CHLORIDE 0.9 % IV SOLN: 250.0000 mL | INTRAVENOUS | Status: DC | PRN

## 2021-02-02 MED ORDER — ASPIRIN 81 MG PO CHEW
81.0000 mg | CHEWABLE_TABLET | ORAL | Status: AC
  Administered 2021-02-03: 81 mg via ORAL
  Filled 2021-02-02: qty 1

## 2021-02-02 MED ORDER — SODIUM CHLORIDE 0.9 % WEIGHT BASED INFUSION
1.0000 mL/kg/h | INTRAVENOUS | Status: DC
  Administered 2021-02-03: 1 mL/kg/h via INTRAVENOUS

## 2021-02-02 MED ORDER — SODIUM CHLORIDE 0.9% FLUSH: 3.0000 mL | INTRAVENOUS | Status: DC | PRN

## 2021-02-02 MED ORDER — ALPRAZOLAM 0.25 MG PO TABS: 0.2500 mg | ORAL_TABLET | Freq: Two times a day (BID) | ORAL | Status: DC | PRN

## 2021-02-02 MED ORDER — SODIUM CHLORIDE 0.9 % WEIGHT BASED INFUSION
3.0000 mL/kg/h | INTRAVENOUS | Status: DC
  Administered 2021-02-03: 3 mL/kg/h via INTRAVENOUS

## 2021-02-02 MED ORDER — NAPHAZOLINE-PHENIRAMINE 0.027-0.315 % OP SOLN: 1.0000 [drp] | Freq: Every day | OPHTHALMIC | Status: DC

## 2021-02-02 MED ORDER — ENOXAPARIN SODIUM 40 MG/0.4ML ~~LOC~~ SOLN
40.0000 mg | SUBCUTANEOUS | Status: DC
  Administered 2021-02-02 – 2021-02-04 (×3): 40 mg via SUBCUTANEOUS
  Filled 2021-02-02 (×3): qty 0.4

## 2021-02-02 MED ORDER — LEVOTHYROXINE SODIUM 50 MCG PO TABS
50.0000 ug | ORAL_TABLET | Freq: Every day | ORAL | Status: DC
  Administered 2021-02-04 – 2021-02-05 (×2): 50 ug via ORAL
  Filled 2021-02-02 (×3): qty 1

## 2021-02-02 MED ORDER — SODIUM CHLORIDE 0.9% FLUSH
3.0000 mL | Freq: Two times a day (BID) | INTRAVENOUS | Status: DC
  Administered 2021-02-02: 3 mL via INTRAVENOUS

## 2021-02-02 MED ORDER — ROPINIROLE HCL 1 MG PO TABS
2.0000 mg | ORAL_TABLET | Freq: Every day | ORAL | Status: DC
  Administered 2021-02-02 – 2021-02-04 (×3): 2 mg via ORAL
  Filled 2021-02-02 (×4): qty 2
  Filled 2021-02-02: qty 4

## 2021-02-02 MED ORDER — FLUTICASONE PROPIONATE 50 MCG/ACT NA SUSP: 2.0000 | Freq: Every day | NASAL | Status: DC | PRN

## 2021-02-02 MED ORDER — SODIUM CHLORIDE 0.9% FLUSH
3.0000 mL | Freq: Two times a day (BID) | INTRAVENOUS | Status: DC
  Administered 2021-02-03 – 2021-02-04 (×2): 3 mL via INTRAVENOUS

## 2021-02-02 MED ORDER — ACETAMINOPHEN 325 MG PO TABS
650.0000 mg | ORAL_TABLET | ORAL | Status: DC | PRN
  Administered 2021-02-04: 650 mg via ORAL
  Filled 2021-02-02: qty 2

## 2021-02-02 MED ORDER — DIPHENHYDRAMINE-APAP (SLEEP) 25-500 MG PO TABS: 2.0000 | ORAL_TABLET | Freq: Every evening | ORAL | Status: DC | PRN

## 2021-02-02 MED ORDER — NITROGLYCERIN 0.4 MG SL SUBL: 0.4000 mg | SUBLINGUAL_TABLET | SUBLINGUAL | Status: DC | PRN

## 2021-02-02 MED ORDER — CLOPIDOGREL BISULFATE 75 MG PO TABS
75.0000 mg | ORAL_TABLET | Freq: Every day | ORAL | Status: DC
  Administered 2021-02-03 – 2021-02-05 (×3): 75 mg via ORAL
  Filled 2021-02-02 (×3): qty 1

## 2021-02-02 MED ORDER — NAPHAZOLINE-PHENIRAMINE 0.025-0.3 % OP SOLN
1.0000 [drp] | Freq: Every day | OPHTHALMIC | Status: DC
  Administered 2021-02-02 – 2021-02-04 (×3): 1 [drp] via OPHTHALMIC
  Filled 2021-02-02: qty 15

## 2021-02-02 MED ORDER — TRAMADOL HCL 50 MG PO TABS
50.0000 mg | ORAL_TABLET | Freq: Four times a day (QID) | ORAL | Status: DC | PRN
  Administered 2021-02-03: 50 mg via ORAL
  Filled 2021-02-02: qty 1

## 2021-02-02 MED ORDER — LISINOPRIL 20 MG PO TABS
20.0000 mg | ORAL_TABLET | Freq: Two times a day (BID) | ORAL | Status: DC
  Administered 2021-02-02 – 2021-02-04 (×4): 20 mg via ORAL
  Filled 2021-02-02 (×4): qty 1

## 2021-02-02 MED ORDER — ONDANSETRON HCL 4 MG/2ML IJ SOLN: 4.0000 mg | Freq: Four times a day (QID) | INTRAMUSCULAR | Status: DC | PRN

## 2021-02-02 NOTE — ED Notes (Signed)
Report given to [facility]

## 2021-02-02 NOTE — ED Triage Notes (Signed)
States," Today I got up and I got sweats, lasted about 10  Minutes, with SOB and chest twinges. Pain to right calf since Sat.

## 2021-02-02 NOTE — ED Notes (Signed)
ED Provider at bedside. 

## 2021-02-02 NOTE — H&P (Signed)
Cardiology Admission History and Physical:   Patient ID: Katherine Hall; MRN: [id number]; DOB: 05/11/1941   Admission date: 02/02/2021  Primary Care Provider: Stacie Glaze, DO Primary Cardiologist: Mertie Moores, MD 10/12/2020 Primary Electrophysiologist: None   PAD Cardiologist: Dr Fletcher Anon Lipid Cardiologist: Dr Debara Pickett  Chief Complaint:  Chest pain  Patient Profile:   Katherine Hall is a 80 y.o. female with a history of CABG 2017 w/ LIMA-LAD, SVG-Diag, SVG-OM1-OM2, SVG-PDA, PAD w/ L SFA PTA, HTN, HLD intol statins, OA, OHS for cardiac cyst, hypothyroid, anemia, L pleural effusion.   She was transferred to [facility] from [facility] where she went for chest pain.  History of Present Illness:   Katherine Hall saw Dr Acie Fredrickson 10/12/2020, she was having some atypical shoulder pain, ?anginal equivalent >> MV ordered. BP elevated, HCTZ increased to 25 mg qd and K+ added, f/u in 2-3 months.   She has appt's w/ Dr Debara Pickett and Kathleen Argue, [facility] scheduled this month.  She has been struggling since bypass surgery.  She feels that she never got back to her previous energy level.  She said she used to be on the go all the time and now cannot do but 1 thing a day.  In the last couple of weeks, she has developed right calf pain.  The pain started with ambulation.  The pain keeps recurring with ambulation.  She has stop after short distances to let the pain eased off before she can keep walking.  This is significantly limiting her.  Sometime in January, she got an upper respiratory infection that she thought was Covid.  She did a home Covid test that was negative.  Of note, the respiratory panel they did on admission was negative.  However, she states that she had cough, fatigue, congestion, multiple other symptoms.  It took several weeks for her to improve.  She finally feels better, but still has significant fatigue from that.  She began having chest pain that reminded her of her pre-CABG symptoms several weeks ago.  She  describes it as twinges.  She states that they occur at rest and with exertion, are completely random.  She has them multiple times a week, but not necessarily every day.  This morning, the symptoms seemed more severe than usual and she decided it was just time to get something done about it.  That is why she went to [place].  In general, she did not take anything for the pain.  However, she took 2 sublingual nitroglycerin during one episode of pain and that did help.  She has not tried anything else.   Past Medical History:  Diagnosis Date  . Anemia   . Arthritis   . Coronary artery disease   . Gastric ulcer    a. h/o bleeding ulcer 2009 requiring 3 pints of blood.  [name] History of removal of cyst    a. 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  [name] HLD (hyperlipidemia)    a. h/o intolerance of statins, prev on Repatha but insurance stopped covering.  [name] HTN (hypertension)   . Thyroid disease     Past Surgical History:  Procedure Laterality Date  . ABDOMINAL AORTOGRAM W/LOWER EXTREMITY Bilateral 11/06/2019   Procedure: ABDOMINAL AORTOGRAM W/LOWER EXTREMITY;  Surgeon: Wellington Hampshire, MD;  Location: [place] [facility];  Service: Cardiovascular;  Laterality: Bilateral;  . ABDOMINAL HYSTERECTOMY    . APPENDECTOMY    . CARDIAC CATHETERIZATION N/A 07/05/2016  Procedure: Left Heart Cath and Coronary Angiography;  Surgeon: Burnell Blanks, MD;  Location: [place] [facility];  Service: Cardiovascular;  Laterality: N/A;  . CARDIAC SURGERY     cyst, not on heart.  . CORONARY ARTERY BYPASS GRAFT N/A 07/07/2016   Procedure: CORONARY ARTERY BYPASS GRAFTING (CABG) x 5 with endoscopic harvesting of the right greater saphenous vein;  Surgeon: Gaye Pollack, MD;  Location: [place] OR;  Service: Open Heart Surgery;  Laterality: N/A;  . HEMORRHOID SURGERY    . INTRAOPERATIVE TRANSESOPHAGEAL ECHOCARDIOGRAM N/A 07/07/2016   Procedure: INTRAOPERATIVE TRANSESOPHAGEAL  ECHOCARDIOGRAM;  Surgeon: Gaye Pollack, MD;  Location: [facility] OR;  Service: Open Heart Surgery;  Laterality: N/A;  . PERIPHERAL VASCULAR BALLOON ANGIOPLASTY  11/06/2019   Procedure: PERIPHERAL VASCULAR BALLOON ANGIOPLASTY;  Surgeon: Wellington Hampshire, MD;  Location: [place] [facility];  Service: Cardiovascular;;  cutting and dcb  . SHOULDER SURGERY       Medications Prior to Admission: Prior to Admission medications   Medication Sig Start Date End Date Taking? Authorizing Provider  acetaminophen (TYLENOL) 650 MG CR tablet Take 1,300 mg by mouth in the morning and at bedtime.   Yes [provider]  acyclovir (ZOVIRAX) 400 MG tablet Take 800 mg by mouth daily as needed (fever blisters).    Yes [provider]  alendronate (FOSAMAX) 70 MG tablet Take 70 mg by mouth every Saturday.  10/16/18  Yes [provider]  clopidogrel (PLAVIX) 75 MG tablet Take 1 tablet (75 mg total) by mouth daily. 03/12/20  Yes Nahser, Wonda Cheng, MD  diphenhydramine-acetaminophen (TYLENOL PM) 25-500 MG TABS tablet Take 2 tablets by mouth at bedtime as needed (sleep).   Yes [provider]  fluticasone (FLONASE) 50 MCG/ACT nasal spray Place 2 sprays into both nostrils daily as needed for allergies.  02/22/18  Yes [provider]  hydrochlorothiazide (HYDRODIURIL) 25 MG tablet Take 1 tablet (25 mg total) by mouth daily. 10/12/20  Yes Nahser, Wonda Cheng, MD  levothyroxine (SYNTHROID, LEVOTHROID) 50 MCG tablet Take 50 mcg by mouth daily. 03/31/16  Yes [provider]  lisinopril (ZESTRIL) 20 MG tablet Take 1 tablet (20 mg total) by mouth 2 (two) times daily. 03/12/20  Yes Nahser, Wonda Cheng, MD  Naphazoline-Pheniramine (OPCON-A OP) Place 1 drop into both eyes daily.   Yes [provider]  nitroGLYCERIN (NITROSTAT) 0.4 MG SL tablet Place 1 tablet (0.4 mg total) under the tongue every 5 (five) minutes x 3 doses as needed for chest pain. 11/09/19  Yes Sande Rives E, PA-C   pantoprazole (PROTONIX) 20 MG tablet Take 20 mg by mouth 2 (two) times daily.   Yes [provider]  potassium chloride (KLOR-CON) 10 MEQ tablet Take 1 tablet (10 mEq total) by mouth daily. 10/12/20  Yes Nahser, Wonda Cheng, MD  rOPINIRole (REQUIP) 2 MG tablet Take 2 mg by mouth at bedtime.   Yes [provider]  traMADol (ULTRAM) 50 MG tablet Take 1 tablet (50 mg total) by mouth every 6 (six) hours as needed for severe pain. 07/13/16  Yes Lars Pinks M, PA-C  Evolocumab with Infusor (Grabill) 420 MG/3.5ML SOCT Inject 1 Dose into the skin every 30 (thirty) days. 08/26/20   Hilty, Nadean Corwin, MD  gatifloxacin (ZYMAXID) 0.5 % SOLN Place 1 drop into the left eye 2 (two) times daily. 01/13/20   [provider]     Allergies:    Allergies  Allergen Reactions  . Penicillins Anaphylaxis  Did it involve swelling of the face/tongue/throat, SOB, or low BP? Yes Did it involve sudden or severe rash/hives, skin peeling, or any reaction on the inside of your mouth or nose? No Did you need to seek medical attention at a hospital or doctor's office? Yes When did it last happen?10+ years If all above answers are "NO", may proceed with cephalosporin use.    . Lincomycin Swelling and Other (See Comments)    passed out  . Naproxen Sodium Other (See Comments)    Stomach ulcers  . Statins Other (See Comments)    Myalgias  . Latex Other (See Comments)    Blisters when in contact with skin  . Tape     blistering    Social History:   Social History   Socioeconomic History  . Marital status: Married    Spouse name: Not on file  . Number of children: Not on file  . Years of education: Not on file  . Highest education level: Not on file  Occupational History  . Not on file  Tobacco Use  . Smoking status: Never Smoker  . Smokeless tobacco: Never Used  Vaping Use  . Vaping Use: Never used  Substance and Sexual Activity  . Alcohol use: No     Comment: social  . Drug use: No  . Sexual activity: Not on file  Other Topics Concern  . Not on file  Social History Narrative  . Not on file   Social Determinants of Health   Financial Resource Strain: Not on file  Food Insecurity: Not on file  Transportation Needs: Not on file  Physical Activity: Not on file  Stress: Not on file  Social Connections: Not on file  Intimate Partner Violence: Not on file    Family History:  The patient's family history includes CAD in her father; Coronary artery disease in her brother and sister; Stroke in her mother.   The patient She indicated that her mother is deceased. She indicated that her father is deceased. She indicated that the status of her sister is unknown. She indicated that the status of her brother is unknown. She indicated that her maternal grandmother is deceased. She indicated that her maternal grandfather is deceased. She indicated that her paternal grandmother is deceased. She indicated that her paternal grandfather is deceased.  ROS:  Please see the history of present illness.  All other ROS reviewed and negative.     Physical Exam/Data:   Vitals:   02/02/21 1200 02/02/21 1230 02/02/21 1344 02/02/21 1500  BP: (!) 142/76 126/60 114/60 (!) 158/64  Pulse: 73 80 72 69  Resp: 12 17 15 16   Temp:    98.4 F (36.9 C)  TempSrc:    Oral  SpO2: 96% 99% 100% 100%  Weight:    55.7 kg  Height:       No intake or output data in the 24 hours ending 02/02/21 1556 Filed Weights   02/02/21 0839 02/02/21 1500  Weight: 58.4 kg 55.7 kg   Body mass index is 25.67 kg/m.  General:  Well nourished, well developed, female in no acute distress HEENT: normal Lymph: no adenopathy Neck:  JVD not elevated Endocrine:  No thryomegaly Vascular: No carotid bruits; upper extremity pulses 2+ bilaterally  Cardiac:  normal S1, S2; RRR; 2/6 murmur, no rub or gallop  Lungs:  clear to auscultation bilaterally, no wheezing, rhonchi or rales  Abd:  soft, nontender, no hepatomegaly  Ext: No edema, right DP 1+, left  DP 2+ Musculoskeletal:  No deformities, BUE and BLE strength normal and equal Skin: warm and dry  Neuro:  CNs 2-12 intact, no focal abnormalities noted Psych:  Normal affect    EKG:  The ECG was personally reviewed: SR, HR 79, no acute ischemic changes Telemetry: SR  Relevant CV Studies:  ECHO: 07/05/2016 - Left ventricle: The cavity size was normal. Wall thickness was  normal. Systolic function was normal. The estimated ejection  fraction was in the range of 60% to 65%. LV mid-ventricle false  tendon. Wall motion was normal; there were no regional wall  motion abnormalities. Doppler parameters are consistent with  abnormal left ventricular relaxation (grade 1 diastolic  dysfunction). The E/e&' ratio is between 8-15, suggesting  indeterminate LV filling pressure.  - Aortic valve: Sclerosis without stenosis. There was mild  regurgitation.  - Mitral valve: Mildly thickened leaflets . There was mild  regurgitation.  - Left atrium: The atrium was normal in size.  - Inferior vena cava: The vessel was normal in size. The  respirophasic diameter changes were in the normal range (>= 50%),  consistent with normal central venous pressure.   Impressions:   - LVEF 60-65%, normal wall thickness, normal wall motion, diastolic  dysfunction, indeterminate LV filling pressure, aortic valve  sclerosis with mild AI, mild MR, normal LA size, normal IVC.   CATH:  07/05/2016 Pre-CABG  Mid RCA lesion, 90 %stenosed.  Ost RPDA to RPDA lesion, 70 %stenosed.  Ost 1st Mrg lesion, 80 %stenosed.  Ost Cx to Prox Cx lesion, 90 %stenosed.  Ost LM to LM lesion, 70 %stenosed.  Ost 2nd Mrg to 2nd Mrg lesion, 70 %stenosed.  Ost LAD to Prox LAD lesion, 70 %stenosed.  Lat 2nd Mrg lesion, 99 %stenosed.  Mid LAD lesion, 50 %stenosed.  The left ventricular systolic function is normal.  LV end diastolic  pressure is normal.  The left ventricular ejection fraction is greater than 65% by visual estimate.   1. Severe triple vessel CAD 2. Severe stenosis involving the left main artery, ostial Circumflex and ostial/proximal LAD. This is best seen in the caudal view.  3. Severe stenosis mid RCA, PDA and obtuse marginal branch of the Circumflex.  4. Normal LV systolic function.  5. 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 Her anatomy and disease is not favorable for PCI. I will ask CT surgery to see her to discuss CABG. She has had prior open chest surgery. 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 Will need to get records of this surgery.  Diagnostic Dominance: Right     MYOVIEW: 11/02/2020  Nuclear stress EF: 77%.  There was no ST segment deviation noted during stress.  No T wave inversion was noted during stress.  The study is normal.  This is a low risk study.  The left ventricular ejection fraction is hyperdynamic (>65%).   1. Normal study without ischemia or infarction.  2. Normal LVEF, >65%.  3. This is a low-risk study.    Laboratory Data:  Chemistry Recent Labs  Lab 02/02/21 0905  NA 133*  K 4.2  CL 97*  CO2 26  GLUCOSE 117*  BUN 20  CREATININE 0.92  CALCIUM 9.1  GFRNONAA >60  ANIONGAP 10    No results for input(s): PROT, ALBUMIN, AST, ALT, ALKPHOS, BILITOT in the last 168 hours. Hematology Recent Labs  Lab 02/02/21 0905  WBC 5.9  RBC 3.75*  HGB 11.9*  HCT  35.9*  MCV 95.7  MCH 31.7  MCHC 33.1  RDW 13.3  PLT 342   Cardiac Enzymes  High Sensitivity Troponin:   Recent Labs  Lab 02/02/21 0905 02/02/21 1110  TROPONINIHS 4 5     BNPNo results for input(s): BNP, PROBNP in the last 168 hours.  DDimer No results for input(s): DDIMER in the last 168 hours. Lipids:  Lab Results  Component Value Date   CHOL 356 (H) 12/10/2020   HDL 71 12/10/2020   LDLCALC 243 (H) 12/10/2020    TRIG 213 (H) 12/10/2020   CHOLHDL 5.0 (H) 12/10/2020   INR:  Lab Results  Component Value Date   INR 1.58 07/07/2016   INR 0.81 07/03/2016   A1c:  Lab Results  Component Value Date   HGBA1C 6.4 (H) 11/08/2019   Thyroid:  Lab Results  Component Value Date   TSH 1.200 07/26/2017    Radiology/Studies:  US Venous Img Lower Right (DVT Study)  Result Date: 02/02/2021 CLINICAL DATA:  Right calf pain. EXAM: RIGHT LOWER EXTREMITY VENOUS DOPPLER ULTRASOUND TECHNIQUE: Gray-scale sonography with graded compression, as well as color Doppler and duplex ultrasound were performed to evaluate the lower extremity deep venous systems from the level of the common femoral vein and including the common femoral, femoral, profunda femoral, popliteal and calf veins including the posterior tibial, peroneal and gastrocnemius veins when visible. The superficial great saphenous vein was also interrogated. Spectral Doppler was utilized to evaluate flow at rest and with distal augmentation maneuvers in the common femoral, femoral and popliteal veins. COMPARISON:  None. FINDINGS: Contralateral Common Femoral Vein: Respiratory phasicity is normal and symmetric with the symptomatic side. No evidence of thrombus. Normal compressibility. Common Femoral Vein: No evidence of thrombus. Normal compressibility, respiratory phasicity and response to augmentation. Saphenofemoral Junction: No evidence of thrombus. Normal compressibility and flow on color Doppler imaging. Profunda Femoral Vein: No evidence of thrombus. Normal compressibility and flow on color Doppler imaging. Femoral Vein: No evidence of thrombus. Normal compressibility, respiratory phasicity and response to augmentation. Popliteal Vein: No evidence of thrombus. Normal compressibility, respiratory phasicity and response to augmentation. Calf Veins: No evidence of thrombus. Normal compressibility and flow on color Doppler imaging. Superficial Great Saphenous Vein: No  evidence of thrombus. Normal compressibility. Venous Reflux:  None. Other Findings: No evidence of superficial thrombophlebitis or abnormal fluid collection. IMPRESSION: No evidence of right lower extremity deep venous thrombosis. Electronically Signed   By: Aletta Edouard M.D.   On: 02/02/2021 09:56   DG Chest Portable 1 View  Result Date: 02/02/2021 CLINICAL DATA:  Chest pain, shortness of breath EXAM: PORTABLE CHEST 1 VIEW COMPARISON:  09/24/2020 FINDINGS: Prior CABG. Heart and mediastinal contours are within normal limits. No focal opacities or effusions. No acute bony abnormality. IMPRESSION: No active disease. Electronically Signed   By: Rolm Baptise M.D.   On: 02/02/2021 09:18    Assessment and Plan:   1. Chest pain, unstable angina -Symptoms have occurred at rest, they remind her of her pre-CABG symptoms, and have been nitrate responsive. -Discussed with Dr. Harrell Gave, feel a definitive evaluation is indicated. -Cardiac catheterization to further evaluate her symptoms.  2.  PAD: -ABIs in July showed decreased TBI bilaterally. -She had a DVT study on the right that was negative today -She is having exertional calf pain consistent with claudication -Recheck ABIs  3.  Hyperlipidemia -Her cholesterol is significantly elevated -Dr. Debara Pickett is attempting to get her into a Repatha study -Recent lipid profile as above, will not  recheck.  Active Problems:   Chest pain     For questions or updates, please contact [place] Please consult [email or web address] for contact info under Cardiology/STEMI.    Jonetta Speak, PA-C  02/02/2021 3:56 PM

## 2021-02-02 NOTE — ED Provider Notes (Signed)
[facility] EMERGENCY DEPARTMENT Provider Note   CSN: [id number] Arrival date & time: 02/02/21  6761     History Chest pain CC  Katherine Hall is a 80 y.o. female w/ hx of CAD s/p CABG (Aug 2017), HLD, HTN, presenting to the ED with chest pain.  She reports she has been feeling unwell all week with very low energy, unable to go about her daily tasks.  She says she feels like he needs to stop and rest all the time.  This morning she woke up around 630 with diffuse diaphoresis, soaked in sweat, and reports she was having twinging in her chest.  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  All the symptoms have since resolved since coming in the ER.  She reports right calf pain worse with ambulation for the past week.  She denies any history of DVT or PE that she is aware of.  She has a hx of peripheral vascular disease - vasc cath on 11/06/19 with drug-coated balloon angioplasty of the mid left SFA.  Noted to have mild right diffuse SFA and popliteal disease with one-vessel runoff below the knee via the anterior tibial artery.  She takes Plavix alone for coronary disease.  She does not take aspirin because of "stomach ulcers".  She has a history of high cholesterol but did not tolerate statins due to side effects.  She denies smoking history.  She reports a very significant family history of MI in multiple family members.  She states that she feels that she had COVID about a month ago in January because she was "having all the symptoms of it except for the loss of smell and taste".  She did a home test which was negative.   Cardiologist is Dr Acie Fredrickson, per his last office assessment:  1. Coronary artery disease-status post coronary artery bypass grafting-  CORONARY ARTERY BYPASS GRAFTING x 5 -LIMA to LAD -SVG to DIAGONAL -SVG to PDA -SEQ SVG OM1 and OM2  HPI     Past Medical History:  Diagnosis Date  . Anemia   . Arthritis   . Coronary artery disease   . Gastric ulcer     a. h/o bleeding ulcer 2009 requiring 3 pints of blood.  [name] History of removal of cyst    a. 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  [name] HLD (hyperlipidemia)    a. h/o intolerance of statins, prev on Repatha but insurance stopped covering.  [name] HTN (hypertension)   . Thyroid disease     Patient Active Problem List   Diagnosis Date Noted  . Urinary retention 11/09/2019  . Hematoma 11/08/2019  . Acute blood loss anemia 11/08/2019  . Statin intolerance 11/08/2019  . Tobacco abuse 11/08/2019  . Pre-diabetes 11/08/2019  . PAD (peripheral artery disease) ([place]) 11/06/2019  . Coronary artery disease involving native coronary artery of native heart without angina pectoris 07/26/2017  . Pure hypercholesterolemia with target low density lipoprotein (LDL) cholesterol less than 70 mg/dL 12/12/2016  . 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 Anemia, unspecified 07/05/2016  . Hyponatremia 07/05/2016  . Chest pain 07/05/2016  . Pain in the chest   . Hypothyroidism 07/04/2016  . HTN (hypertension) 07/04/2016  . Hyperlipidemia 07/04/2016  . Unstable angina ([place]) 07/03/2016    Past Surgical History:  Procedure Laterality Date  . ABDOMINAL AORTOGRAM W/LOWER EXTREMITY Bilateral 11/06/2019   Procedure: ABDOMINAL AORTOGRAM W/LOWER EXTREMITY;  Surgeon: Wellington Hampshire, MD;  Location:  [facility];  Service: Cardiovascular;  Laterality: Bilateral;  . ABDOMINAL HYSTERECTOMY    . APPENDECTOMY    . CARDIAC CATHETERIZATION N/A 07/05/2016   Procedure: Left Heart Cath and Coronary Angiography;  Surgeon: Burnell Blanks, MD;  Location: [facility];  Service: Cardiovascular;  Laterality: N/A;  . CARDIAC SURGERY     cyst, not on heart.  . CORONARY ARTERY BYPASS GRAFT N/A 07/07/2016   Procedure: CORONARY ARTERY BYPASS GRAFTING (CABG) x 5 with endoscopic harvesting of the right greater saphenous vein;  Surgeon: Gaye Pollack, MD;  Location: [facility] OR;  Service: Open Heart Surgery;   Laterality: N/A;  . HEMORRHOID SURGERY    . INTRAOPERATIVE TRANSESOPHAGEAL ECHOCARDIOGRAM N/A 07/07/2016   Procedure: INTRAOPERATIVE TRANSESOPHAGEAL ECHOCARDIOGRAM;  Surgeon: Gaye Pollack, MD;  Location: [facility] OR;  Service: Open Heart Surgery;  Laterality: N/A;  . PERIPHERAL VASCULAR BALLOON ANGIOPLASTY  11/06/2019   Procedure: PERIPHERAL VASCULAR BALLOON ANGIOPLASTY;  Surgeon: Wellington Hampshire, MD;  Location: [facility];  Service: Cardiovascular;;  cutting and dcb  . SHOULDER SURGERY       OB History   No obstetric history on file.     Family History  Problem Relation Age of Onset  . Stroke Mother        Stroke age 22  . CAD Father        died of MI age 64  . Coronary artery disease Sister        one sister had massive MI at 57, another sister has 11 stents  . Coronary artery disease Brother        one brother had MI at age 73 and died at 78, another has had 4 MIs and 2 bypasses    Social History   Tobacco Use  . Smoking status: Never Smoker  . Smokeless tobacco: Never Used  Vaping Use  . Vaping Use: Never used  Substance Use Topics  . Alcohol use: No    Comment: social  . Drug use: No    Home Medications Prior to Admission medications   Medication Sig Start Date End Date Taking? Authorizing Provider  acetaminophen (TYLENOL) 650 MG CR tablet Take 1,300 mg by mouth daily.   Yes [provider]  acyclovir (ZOVIRAX) 400 MG tablet Take 800 mg by mouth daily as needed (fever blisters).    Yes [provider]  alendronate (FOSAMAX) 70 MG tablet Take 70 mg by mouth every Saturday.  10/16/18  Yes [provider]  clopidogrel (PLAVIX) 75 MG tablet Take 1 tablet (75 mg total) by mouth daily. 03/12/20  Yes Nahser, Wonda Cheng, MD  diphenhydrAMINE HCl (BENADRYL ALLERGY PO) Take 1 tablet by mouth daily as needed.   Yes [provider]  diphenhydramine-acetaminophen (TYLENOL PM) 25-500 MG TABS tablet Take 2 tablets by mouth at bedtime as needed  (sleep).   Yes [provider]  ferrous sulfate 325 (65 FE) MG tablet Take 325 mg by mouth every Monday, Wednesday, and Friday.   Yes [provider]  fluticasone (FLONASE) 50 MCG/ACT nasal spray Place 2 sprays into both nostrils daily as needed for allergies.  02/22/18  Yes [provider]  hydrochlorothiazide (HYDRODIURIL) 25 MG tablet Take 1 tablet (25 mg total) by mouth daily. 10/12/20  Yes Nahser, Wonda Cheng, MD  levothyroxine (SYNTHROID, LEVOTHROID) 50 MCG tablet Take 50 mcg by mouth daily. 03/31/16  Yes [provider]  lisinopril (ZESTRIL) 20 MG tablet Take 1 tablet (20 mg  total) by mouth 2 (two) times daily. 03/12/20  Yes Nahser, Wonda Cheng, MD  nitroGLYCERIN (NITROSTAT) 0.4 MG SL tablet Place 1 tablet (0.4 mg total) under the tongue every 5 (five) minutes x 3 doses as needed for chest pain. 11/09/19  Yes Sande Rives E, PA-C  pantoprazole (PROTONIX) 40 MG tablet Take 40 mg by mouth 2 (two) times daily.  06/11/18  Yes [provider]  potassium chloride (KLOR-CON) 10 MEQ tablet Take 1 tablet (10 mEq total) by mouth daily. 10/12/20  Yes Nahser, Wonda Cheng, MD  rOPINIRole (REQUIP) 2 MG tablet Take 2 mg by mouth at bedtime.   Yes [provider]  traMADol (ULTRAM) 50 MG tablet Take 1 tablet (50 mg total) by mouth every 6 (six) hours as needed for severe pain. 07/13/16  Yes Lars Pinks M, PA-C  Evolocumab with Infusor ([place]) 420 MG/3.5ML SOCT Inject 1 Dose into the skin every 30 (thirty) days. 08/26/20   Hilty, Nadean Corwin, MD  gatifloxacin (ZYMAXID) 0.5 % SOLN Place 1 drop into the left eye 2 (two) times daily. 01/13/20   [provider]    Allergies    Penicillins, Lincomycin, Naproxen sodium, Statins, Latex, and Tape  Review of Systems   Review of Systems  Constitutional: Positive for appetite change and fatigue. Negative for fever.  HENT: Negative for ear pain and sore throat.   Eyes: Negative for pain and visual  disturbance.  Respiratory: Negative for cough and shortness of breath.   Cardiovascular: Positive for chest pain. Negative for palpitations.  Gastrointestinal: Negative for abdominal pain and vomiting.  Genitourinary: Negative for dysuria and hematuria.  Musculoskeletal: Negative for arthralgias and back pain.  Skin: Negative for color change and rash.  Neurological: Negative for syncope and light-headedness.  All other systems reviewed and are negative.   Physical Exam Updated Vital Signs BP (!) 148/73   Pulse 75   Temp 97.9 F (36.6 C) (Oral)   Resp (!) 22   Ht 4\' 10"  (1.473 m)   Wt 58.4 kg   SpO2 96%   BMI 26.90 kg/m   Physical Exam Constitutional:      General: She is not in acute distress. HENT:     Head: Normocephalic and atraumatic.  Eyes:     Conjunctiva/sclera: Conjunctivae normal.     Pupils: Pupils are equal, round, and reactive to light.  Cardiovascular:     Rate and Rhythm: Normal rate and regular rhythm.  Pulmonary:     Effort: Pulmonary effort is normal. No respiratory distress.  Abdominal:     General: There is no distension.     Tenderness: There is no abdominal tenderness.  Musculoskeletal:     Right lower leg: No tenderness. No edema.     Left lower leg: No tenderness. No edema.  Skin:    General: Skin is warm and dry.  Neurological:     General: No focal deficit present.     Mental Status: She is alert. Mental status is at baseline.  Psychiatric:        Mood and Affect: Mood normal.        Behavior: Behavior normal.     ED Results / Procedures / Treatments   Labs (all labs ordered are listed, but only abnormal results are displayed) Labs Reviewed  BASIC METABOLIC PANEL - Abnormal; Notable for the following components:      Result Value   Sodium 133 (*)    Chloride 97 (*)    Glucose,  Bld 117 (*)    All other components within normal limits  CBC WITH DIFFERENTIAL/PLATELET - Abnormal; Notable for the following components:   RBC 3.75 (*)     Hemoglobin 11.9 (*)    HCT 35.9 (*)    All other components within normal limits  RESP PANEL BY RT-PCR (FLU A&B, COVID) ARPGX2  TROPONIN I (HIGH SENSITIVITY)  TROPONIN I (HIGH SENSITIVITY)    EKG EKG Interpretation  Date/Time:  Tuesday February 02 2021 08:42:00 EST Ventricular Rate:  79 PR Interval:    QRS Duration: 98 QT Interval:  385 QTC Calculation: 442 R Axis:   58 Text Interpretation: Sinus rhythm No significant change from Sep 24 2020 ecg No STEMI Confirmed by Octaviano Glow [phone]) on 02/02/2021 8:50:49 AM   Radiology US Venous Img Lower Right (DVT Study)  Result Date: 02/02/2021 CLINICAL DATA:  Right calf pain. EXAM: RIGHT LOWER EXTREMITY VENOUS DOPPLER ULTRASOUND TECHNIQUE: Gray-scale sonography with graded compression, as well as color Doppler and duplex ultrasound were performed to evaluate the lower extremity deep venous systems from the level of the common femoral vein and including the common femoral, femoral, profunda femoral, popliteal and calf veins including the posterior tibial, peroneal and gastrocnemius veins when visible. The superficial great saphenous vein was also interrogated. Spectral Doppler was utilized to evaluate flow at rest and with distal augmentation maneuvers in the common femoral, femoral and popliteal veins. COMPARISON:  None. FINDINGS: Contralateral Common Femoral Vein: Respiratory phasicity is normal and symmetric with the symptomatic side. No evidence of thrombus. Normal compressibility. Common Femoral Vein: No evidence of thrombus. Normal compressibility, respiratory phasicity and response to augmentation. Saphenofemoral Junction: No evidence of thrombus. Normal compressibility and flow on color Doppler imaging. Profunda Femoral Vein: No evidence of thrombus. Normal compressibility and flow on color Doppler imaging. Femoral Vein: No evidence of thrombus. Normal compressibility, respiratory phasicity and response to augmentation. Popliteal Vein: No  evidence of thrombus. Normal compressibility, respiratory phasicity and response to augmentation. Calf Veins: No evidence of thrombus. Normal compressibility and flow on color Doppler imaging. Superficial Great Saphenous Vein: No evidence of thrombus. Normal compressibility. Venous Reflux:  None. Other Findings: No evidence of superficial thrombophlebitis or abnormal fluid collection. IMPRESSION: No evidence of right lower extremity deep venous thrombosis. Electronically Signed   By: Aletta Edouard M.D.   On: 02/02/2021 09:56   DG Chest Portable 1 View  Result Date: 02/02/2021 CLINICAL DATA:  Chest pain, shortness of breath EXAM: PORTABLE CHEST 1 VIEW COMPARISON:  09/24/2020 FINDINGS: Prior CABG. Heart and mediastinal contours are within normal limits. No focal opacities or effusions. No acute bony abnormality. IMPRESSION: No active disease. Electronically Signed   By: Rolm Baptise M.D.   On: 02/02/2021 09:18    Procedures Procedures   Medications Ordered in ED Medications  aspirin chewable tablet 162 mg (162 mg Oral Given 02/02/21 0914)    ED Course  I have reviewed the triage vital signs and the nursing notes.  Pertinent labs & imaging results that were available during my care of the patient were reviewed by me and considered in my medical decision making (see chart for details).  This patient presents to the Emergency Department with complaint of chest pain. This involves an extensive number of treatment options, and is a complaint that carries with it a high risk of complications and morbidity.  The differential diagnosis includes ACS vs Pneumothorax vs PE vs Reflux/Gastritis vs MSK pain vs Pneumonia vs other.  I ordered, reviewed, and interpreted labs,  including trop 4 -> 5.  Covid negative.  CBC and BMP largely unremarkable.   I ordered medication aspirin 162 mg for chest pain (minimal ACS dose used with hx of gastric ulcer). Pt took morning plavix and other morning meds I ordered  imaging studies which included DVT ultrasound RLE and DG CHEST I independently visualized and interpreted imaging which showed no acute thrombosis or acute intrathoracic process and the monitor tracing which showed NSR Previous records obtained and reviewed showing cardiac w/u I personally reviewed the patients ECG which showed sinus rhythm with no acute ischemic findings  I consulted cardiology and discussed lab and imaging findings.  See ED course below.  Right lower extremity pain is likely 2/2 claudication from chronic PVD, last noted on CTA last year in Dec 2020.  She does not have an ischemic foot or evidence of infection or compartment syndrome.  Her pain is minimal while at rest, worse with ambulation.  She may require vascular consultation or imaging while inpatient vs close outpatient f/u with her vascular doctor.   Clinical Course as of 02/02/21 1231  Tue Feb 02, 2021  1001 : No evidence of right lower extremity deep venous thrombosis.  [MT]  1036 I spoke to Dr Stanford Breed from cardiology and will be admitting the patient to the cardiology service for observation and further evaluation.  Patient reassessed - remains pain free - and updated on plan. [MT]  1245 Right leg pain may be related to chronic claudication, doubt acute ischemia.  No DVT visualized.   [MT]    Clinical Course User Index [MT] Jeremy Mclamb, Carola Rhine, MD    Final Clinical Impression(s) / ED Diagnoses Final diagnoses:  Chest pain, unspecified type  Right leg pain    Rx / DC Orders ED Discharge Orders    None       Wyvonnia Dusky, MD 02/02/21 1232

## 2021-02-03 ENCOUNTER — Observation Stay (HOSPITAL_BASED_OUTPATIENT_CLINIC_OR_DEPARTMENT_OTHER): Payer: Medicare Other

## 2021-02-03 ENCOUNTER — Inpatient Hospital Stay (HOSPITAL_COMMUNITY)
Admission: EM | Disposition: A | Discharge status: Home / Self Care | Payer: Self-pay | Source: Home / Self Care | Attending: Cardiology

## 2021-02-03 DIAGNOSIS — R079 Chest pain, unspecified: Secondary | ICD-10-CM | POA: Diagnosis not present

## 2021-02-03 DIAGNOSIS — Z7982 Long term (current) use of aspirin: Secondary | ICD-10-CM | POA: Diagnosis not present

## 2021-02-03 DIAGNOSIS — Z79890 Hormone replacement therapy (postmenopausal): Secondary | ICD-10-CM | POA: Diagnosis not present

## 2021-02-03 DIAGNOSIS — Z880 Allergy status to penicillin: Secondary | ICD-10-CM | POA: Diagnosis not present

## 2021-02-03 DIAGNOSIS — I25110 Atherosclerotic heart disease of native coronary artery with unstable angina pectoris: Secondary | ICD-10-CM | POA: Diagnosis not present

## 2021-02-03 DIAGNOSIS — E782 Mixed hyperlipidemia: Secondary | ICD-10-CM | POA: Diagnosis not present

## 2021-02-03 DIAGNOSIS — Z883 Allergy status to other anti-infective agents status: Secondary | ICD-10-CM | POA: Diagnosis not present

## 2021-02-03 DIAGNOSIS — I10 Essential (primary) hypertension: Secondary | ICD-10-CM | POA: Diagnosis not present

## 2021-02-03 DIAGNOSIS — I351 Nonrheumatic aortic (valve) insufficiency: Secondary | ICD-10-CM | POA: Diagnosis not present

## 2021-02-03 DIAGNOSIS — R7303 Prediabetes: Secondary | ICD-10-CM | POA: Diagnosis not present

## 2021-02-03 DIAGNOSIS — Z888 Allergy status to other drugs, medicaments and biological substances status: Secondary | ICD-10-CM | POA: Diagnosis not present

## 2021-02-03 DIAGNOSIS — Z7983 Long term (current) use of bisphosphonates: Secondary | ICD-10-CM | POA: Diagnosis not present

## 2021-02-03 DIAGNOSIS — Z20822 Contact with and (suspected) exposure to covid-19: Secondary | ICD-10-CM | POA: Diagnosis not present

## 2021-02-03 DIAGNOSIS — Z886 Allergy status to analgesic agent status: Secondary | ICD-10-CM | POA: Diagnosis not present

## 2021-02-03 DIAGNOSIS — I739 Peripheral vascular disease, unspecified: Secondary | ICD-10-CM | POA: Diagnosis not present

## 2021-02-03 DIAGNOSIS — Z8249 Family history of ischemic heart disease and other diseases of the circulatory system: Secondary | ICD-10-CM | POA: Diagnosis not present

## 2021-02-03 DIAGNOSIS — Z7902 Long term (current) use of antithrombotics/antiplatelets: Secondary | ICD-10-CM | POA: Diagnosis not present

## 2021-02-03 DIAGNOSIS — Z79899 Other long term (current) drug therapy: Secondary | ICD-10-CM | POA: Diagnosis not present

## 2021-02-03 DIAGNOSIS — I25710 Atherosclerosis of autologous vein coronary artery bypass graft(s) with unstable angina pectoris: Secondary | ICD-10-CM | POA: Diagnosis not present

## 2021-02-03 DIAGNOSIS — M79604 Pain in right leg: Secondary | ICD-10-CM | POA: Diagnosis present

## 2021-02-03 DIAGNOSIS — Z91040 Latex allergy status: Secondary | ICD-10-CM | POA: Diagnosis not present

## 2021-02-03 DIAGNOSIS — E039 Hypothyroidism, unspecified: Secondary | ICD-10-CM | POA: Diagnosis not present

## 2021-02-03 DIAGNOSIS — I200 Unstable angina: Secondary | ICD-10-CM | POA: Diagnosis not present

## 2021-02-03 DIAGNOSIS — Z951 Presence of aortocoronary bypass graft: Secondary | ICD-10-CM | POA: Diagnosis not present

## 2021-02-03 DIAGNOSIS — I358 Other nonrheumatic aortic valve disorders: Secondary | ICD-10-CM | POA: Diagnosis not present

## 2021-02-03 DIAGNOSIS — Z91048 Other nonmedicinal substance allergy status: Secondary | ICD-10-CM | POA: Diagnosis not present

## 2021-02-03 DIAGNOSIS — E7800 Pure hypercholesterolemia, unspecified: Secondary | ICD-10-CM | POA: Diagnosis not present

## 2021-02-03 HISTORY — PX: CORONARY BALLOON ANGIOPLASTY: CATH118233

## 2021-02-03 HISTORY — PX: LEFT HEART CATH AND CORS/GRAFTS ANGIOGRAPHY: CATH118250

## 2021-02-03 LAB — COMPREHENSIVE METABOLIC PANEL
ALT: 11 U/L (ref 0–44)
AST: 13 U/L — ABNORMAL LOW (ref 15–41)
Albumin: 3.5 g/dL (ref 3.5–5.0)
Alkaline Phosphatase: 30 U/L — ABNORMAL LOW (ref 38–126)
Anion gap: 12 (ref 5–15)
BUN: 16 mg/dL (ref 8–23)
CO2: 24 mmol/L (ref 22–32)
Calcium: 9.2 mg/dL (ref 8.9–10.3)
Chloride: 96 mmol/L — ABNORMAL LOW (ref 98–111)
Creatinine, Ser: 0.8 mg/dL (ref 0.44–1.00)
GFR, Estimated: >60 mL/min (ref >60)
Glucose, Bld: 89 mg/dL (ref 70–99)
Potassium: 3.9 mmol/L (ref 3.5–5.1)
Sodium: 132 mmol/L — ABNORMAL LOW (ref 135–145)
Total Bilirubin: 0.7 mg/dL (ref 0.3–1.2)
Total Protein: 5.6 g/dL — ABNORMAL LOW (ref 6.5–8.1)

## 2021-02-03 LAB — ECHOCARDIOGRAM COMPLETE
Area-P 1/2: 3.08 cm2
Calc EF: 58.8 %
Height: 58 in
P 1/2 time: 518 msec
S' Lateral: 2.4 cm
Single Plane A2C EF: 54.2 %
Single Plane A4C EF: 63.8 %
Weight: 2076.91 oz

## 2021-02-03 LAB — POCT ACTIVATED CLOTTING TIME: Activated Clotting Time: 291 seconds

## 2021-02-03 SURGERY — LEFT HEART CATH AND CORS/GRAFTS ANGIOGRAPHY
Anesthesia: LOCAL

## 2021-02-03 MED ORDER — SODIUM CHLORIDE 0.9 % WEIGHT BASED INFUSION: 1.0000 mL/kg/h | INTRAVENOUS | Status: AC

## 2021-02-03 MED ORDER — NITROGLYCERIN IN D5W 200-5 MCG/ML-% IV SOLN: 0.0000 ug/min | INTRAVENOUS | Status: DC

## 2021-02-03 MED ORDER — SODIUM CHLORIDE 0.9% FLUSH
3.0000 mL | Freq: Two times a day (BID) | INTRAVENOUS | Status: DC
  Administered 2021-02-04: 3 mL via INTRAVENOUS

## 2021-02-03 MED ORDER — HEPARIN SODIUM (PORCINE) 1000 UNIT/ML IJ SOLN
INTRAMUSCULAR | Status: AC
  Filled 2021-02-03: qty 1

## 2021-02-03 MED ORDER — HEPARIN (PORCINE) IN NACL 1000-0.9 UT/500ML-% IV SOLN
INTRAVENOUS | Status: DC | PRN
  Administered 2021-02-03 (×2): 500 mL

## 2021-02-03 MED ORDER — BIVALIRUDIN TRIFLUOROACETATE 250 MG IV SOLR
INTRAVENOUS | Status: AC
  Filled 2021-02-03: qty 250

## 2021-02-03 MED ORDER — VERAPAMIL HCL 2.5 MG/ML IV SOLN
INTRAVENOUS | Status: AC
  Filled 2021-02-03: qty 2

## 2021-02-03 MED ORDER — LIDOCAINE HCL (PF) 1 % IJ SOLN
INTRAMUSCULAR | Status: AC
  Filled 2021-02-03: qty 30

## 2021-02-03 MED ORDER — NITROGLYCERIN 1 MG/10 ML FOR IR/CATH LAB
INTRA_ARTERIAL | Status: AC
  Filled 2021-02-03: qty 10

## 2021-02-03 MED ORDER — ASPIRIN 81 MG PO CHEW
81.0000 mg | CHEWABLE_TABLET | Freq: Every day | ORAL | Status: DC
  Administered 2021-02-04 – 2021-02-05 (×2): 81 mg via ORAL
  Filled 2021-02-03 (×2): qty 1

## 2021-02-03 MED ORDER — HEPARIN (PORCINE) IN NACL 1000-0.9 UT/500ML-% IV SOLN
INTRAVENOUS | Status: AC
  Filled 2021-02-03: qty 1500

## 2021-02-03 MED ORDER — BIVALIRUDIN BOLUS VIA INFUSION - CUPID
INTRAVENOUS | Status: DC | PRN
  Administered 2021-02-03: 44.175 mg via INTRAVENOUS

## 2021-02-03 MED ORDER — NITROGLYCERIN IN D5W 200-5 MCG/ML-% IV SOLN
INTRAVENOUS | Status: AC | PRN
  Administered 2021-02-03: 10 ug/min via INTRAVENOUS

## 2021-02-03 MED ORDER — MIDAZOLAM HCL 2 MG/2ML IJ SOLN
INTRAMUSCULAR | Status: DC | PRN
  Administered 2021-02-03 (×2): 1 mg via INTRAVENOUS

## 2021-02-03 MED ORDER — SODIUM CHLORIDE 0.9 % IV SOLN
INTRAVENOUS | Status: DC | PRN
  Administered 2021-02-03: 1.75 mg/kg/h via INTRAVENOUS

## 2021-02-03 MED ORDER — FENTANYL CITRATE (PF) 100 MCG/2ML IJ SOLN
INTRAMUSCULAR | Status: AC
  Filled 2021-02-03: qty 2

## 2021-02-03 MED ORDER — MIDAZOLAM HCL 2 MG/2ML IJ SOLN
INTRAMUSCULAR | Status: AC
  Filled 2021-02-03: qty 2

## 2021-02-03 MED ORDER — SODIUM CHLORIDE 0.9% FLUSH: 3.0000 mL | INTRAVENOUS | Status: DC | PRN

## 2021-02-03 MED ORDER — LIDOCAINE HCL (PF) 1 % IJ SOLN
INTRAMUSCULAR | Status: DC | PRN
  Administered 2021-02-03: 10 mL
  Administered 2021-02-03: 20 mL
  Administered 2021-02-03: 2 mL

## 2021-02-03 MED ORDER — IOHEXOL 350 MG/ML SOLN
INTRAVENOUS | Status: DC | PRN
  Administered 2021-02-03: 180 mL via INTRA_ARTERIAL

## 2021-02-03 MED ORDER — IOHEXOL 350 MG/ML SOLN
INTRAVENOUS | Status: AC
  Filled 2021-02-03: qty 1

## 2021-02-03 MED ORDER — CHLORHEXIDINE GLUCONATE CLOTH 2 % EX PADS
6.0000 | MEDICATED_PAD | Freq: Every day | CUTANEOUS | Status: DC
  Administered 2021-02-03: 6 via TOPICAL

## 2021-02-03 MED ORDER — FENTANYL CITRATE (PF) 100 MCG/2ML IJ SOLN
INTRAMUSCULAR | Status: DC | PRN
  Administered 2021-02-03 (×4): 25 ug via INTRAVENOUS

## 2021-02-03 MED ORDER — NITROGLYCERIN IN D5W 200-5 MCG/ML-% IV SOLN
INTRAVENOUS | Status: AC
  Filled 2021-02-03: qty 250

## 2021-02-03 MED ORDER — SODIUM CHLORIDE 0.9 % IV SOLN: 250.0000 mL | INTRAVENOUS | Status: DC | PRN

## 2021-02-03 SURGICAL SUPPLY — 36 items
BAG SNAP BAND KOVER 36X36 (MISCELLANEOUS) ×2 IMPLANT
BALLN MINITREK OTW 2.0X12 (BALLOONS) ×2
BALLN SAPPHIRE 2.5X12 (BALLOONS) ×2
BALLOON MINITREK OTW 2.0X12 (BALLOONS) ×1 IMPLANT
BALLOON SAPPHIRE 2.5X12 (BALLOONS) ×1 IMPLANT
CATH EXPO 5F MPA-1 (CATHETERS) ×2 IMPLANT
CATH INFINITI 5 FR IM (CATHETERS) ×2 IMPLANT
CATH INFINITI 5FR MULTPACK ANG (CATHETERS) ×2 IMPLANT
CATH LAUNCHER 6FR AL1 (CATHETERS) ×1 IMPLANT
CATH LAUNCHER 6FR AR1 (CATHETERS) ×2 IMPLANT
CATH VISTA GUIDE 6FR MPA1 (CATHETERS) ×2 IMPLANT
CATHETER LAUNCHER 6FR AL1 (CATHETERS) ×2
CATHETER LAUNCHER 6FR MP1 (CATHETERS) ×2 IMPLANT
CLOSURE PERCLOSE PROSTYLE (VASCULAR PRODUCTS) ×2 IMPLANT
COVER DOME SNAP 22 D (MISCELLANEOUS) ×2 IMPLANT
GLIDESHEATH SLEND SS 6F .021 (SHEATH) ×2 IMPLANT
GUIDEWIRE INQWIRE 1.5J.035X260 (WIRE) ×1 IMPLANT
INQWIRE 1.5J .035X260CM (WIRE) ×2
KIT ENCORE 26 ADVANTAGE (KITS) ×2 IMPLANT
KIT HEART LEFT (KITS) ×2 IMPLANT
KIT MICROPUNCTURE NIT STIFF (SHEATH) ×2 IMPLANT
NEEDLE PERC 21GX4CM (NEEDLE) ×2 IMPLANT
PACK CARDIAC CATHETERIZATION (CUSTOM PROCEDURE TRAY) ×2 IMPLANT
SHEATH PINNACLE 5F 10CM (SHEATH) ×2 IMPLANT
SHEATH PINNACLE 6F 10CM (SHEATH) ×2 IMPLANT
SHEATH PROBE COVER 6X72 (BAG) ×2 IMPLANT
TRANSDUCER W/STOPCOCK (MISCELLANEOUS) ×2 IMPLANT
TUBING CIL FLEX 10 FLL-RA (TUBING) ×2 IMPLANT
WIRE ASAHI FIELDER XT 190CM (WIRE) ×2 IMPLANT
WIRE ASAHI FIELDER XT 300CM (WIRE) ×2 IMPLANT
WIRE ASAHI MIRACLEBROS-3 300CM (WIRE) ×2 IMPLANT
WIRE ASAHI MIRACLEBROS-6 300CM (WIRE) ×2 IMPLANT
WIRE EMERALD 3MM-J .035X150CM (WIRE) ×2 IMPLANT
WIRE HI TORQ WHISPER MS 190CM (WIRE) ×4 IMPLANT
WIRE RUNTHROUGH .014X180CM (WIRE) ×4 IMPLANT
WIRE RUNTHROUGH .014X300CM (WIRE) ×2 IMPLANT

## 2021-02-03 NOTE — Progress Notes (Signed)
Progress Note  Patient Name: Katherine Hall Date of Encounter: 02/03/2021  [facility] Cardiologist: Mertie Moores, MD   Subjective   No acute events overnight. Bedside echo being performed this AM. Awaiting cath. No further chest twinges, breathing is stable. She is feeling down due to recent family health stresses.  Inpatient Medications    Scheduled Meds: . clopidogrel  75 mg Oral Daily  . enoxaparin (LOVENOX) injection  40 mg Subcutaneous Q24H  . levothyroxine  50 mcg Oral Daily  . lisinopril  20 mg Oral BID  . naphazoline-pheniramine  1 drop Both Eyes Daily  . pantoprazole  20 mg Oral BID  . rOPINIRole  2 mg Oral QHS  . sodium chloride flush  3 mL Intravenous Q12H  . sodium chloride flush  3 mL Intravenous Q12H   Continuous Infusions: . sodium chloride    . sodium chloride    . sodium chloride 1 mL/kg/hr (02/03/21 0500)   PRN Meds: sodium chloride, sodium chloride, acetaminophen **AND** diphenhydrAMINE, acetaminophen, ALPRAZolam, fluticasone, nitroGLYCERIN, ondansetron (ZOFRAN) IV, sodium chloride flush, sodium chloride flush, traMADol, zolpidem   Vital Signs    Vitals:   02/02/21 2002 02/02/21 2352 02/03/21 0356 02/03/21 0758  BP: 136/60 107/76 (!) 122/58 128/72  Pulse: 85 72 73   Resp: 16 16 16 17   Temp: 98.2 F (36.8 C) 97.8 F (36.6 C) 97.6 F (36.4 C) 98.1 F (36.7 C)  TempSrc: Oral Oral Oral Oral  SpO2: 96% 98% 95%   Weight:   58.9 kg   Height:   4\' 10"  (1.473 m)    No intake or output data in the 24 hours ending 02/03/21 0905 Last 3 Weights 02/03/2021 02/02/2021 02/02/2021  Weight (lbs) 129 lb 12.9 oz 122 lb 12.8 oz 128 lb 11.2 oz  Weight (kg) 58.88 kg 55.702 kg 58.378 kg      Telemetry    NSR with one very brief episode of SVT (9 beats) - Personally Reviewed  ECG    No new since 02/02/21 - Personally Reviewed  Physical Exam   GEN: No acute distress.   Neck: No JVD Cardiac: RRR, no murmurs, rubs, or gallops.  Respiratory: Clear to  auscultation bilaterally. GI: Soft, nontender, non-distended  MS: No edema; No deformity. Neuro:  Nonfocal  Psych: Normal affect   Labs    High Sensitivity Troponin:   Recent Labs  Lab 02/02/21 0905 02/02/21 1110  TROPONINIHS 4 5      Chemistry Recent Labs  Lab 02/02/21 0905 02/03/21 0204  NA 133* 132*  K 4.2 3.9  CL 97* 96*  CO2 26 24  GLUCOSE 117* 89  BUN 20 16  CREATININE 0.92 0.80  CALCIUM 9.1 9.2  PROT  --  5.6*  ALBUMIN  --  3.5  AST  --  13*  ALT  --  11  ALKPHOS  --  30*  BILITOT  --  0.7  GFRNONAA >60 >60  ANIONGAP 10 12     Hematology Recent Labs  Lab 02/02/21 0905  WBC 5.9  RBC 3.75*  HGB 11.9*  HCT 35.9*  MCV 95.7  MCH 31.7  MCHC 33.1  RDW 13.3  PLT 342    BNPNo results for input(s): BNP, PROBNP in the last 168 hours.   DDimer No results for input(s): DDIMER in the last 168 hours.   Radiology    US Venous Img Lower Right (DVT Study)  Result Date: 02/02/2021 CLINICAL DATA:  Right calf pain. EXAM: RIGHT LOWER EXTREMITY VENOUS  DOPPLER ULTRASOUND TECHNIQUE: Gray-scale sonography with graded compression, as well as color Doppler and duplex ultrasound were performed to evaluate the lower extremity deep venous systems from the level of the common femoral vein and including the common femoral, femoral, profunda femoral, popliteal and calf veins including the posterior tibial, peroneal and gastrocnemius veins when visible. The superficial great saphenous vein was also interrogated. Spectral Doppler was utilized to evaluate flow at rest and with distal augmentation maneuvers in the common femoral, femoral and popliteal veins. COMPARISON:  None. FINDINGS: Contralateral Common Femoral Vein: Respiratory phasicity is normal and symmetric with the symptomatic side. No evidence of thrombus. Normal compressibility. Common Femoral Vein: No evidence of thrombus. Normal compressibility, respiratory phasicity and response to augmentation. Saphenofemoral Junction:  No evidence of thrombus. Normal compressibility and flow on color Doppler imaging. Profunda Femoral Vein: No evidence of thrombus. Normal compressibility and flow on color Doppler imaging. Femoral Vein: No evidence of thrombus. Normal compressibility, respiratory phasicity and response to augmentation. Popliteal Vein: No evidence of thrombus. Normal compressibility, respiratory phasicity and response to augmentation. Calf Veins: No evidence of thrombus. Normal compressibility and flow on color Doppler imaging. Superficial Great Saphenous Vein: No evidence of thrombus. Normal compressibility. Venous Reflux:  None. Other Findings: No evidence of superficial thrombophlebitis or abnormal fluid collection. IMPRESSION: No evidence of right lower extremity deep venous thrombosis. Electronically Signed   By: Aletta Edouard M.D.   On: 02/02/2021 09:56   DG Chest Portable 1 View  Result Date: 02/02/2021 CLINICAL DATA:  Chest pain, shortness of breath EXAM: PORTABLE CHEST 1 VIEW COMPARISON:  09/24/2020 FINDINGS: Prior CABG. Heart and mediastinal contours are within normal limits. No focal opacities or effusions. No acute bony abnormality. IMPRESSION: No active disease. Electronically Signed   By: Rolm Baptise M.D.   On: 02/02/2021 09:18    Cardiac Studies   ECHO: 07/05/2016 - Left ventricle: The cavity size was normal. Wall thickness was  normal. Systolic function was normal. The estimated ejection  fraction was in the range of 60% to 65%. LV mid-ventricle false  tendon. Wall motion was normal; there were no regional wall  motion abnormalities. Doppler parameters are consistent with  abnormal left ventricular relaxation (grade 1 diastolic  dysfunction). The E/e&' ratio is between 8-15, suggesting  indeterminate LV filling pressure.  - Aortic valve: Sclerosis without stenosis. There was mild  regurgitation.  - Mitral valve: Mildly thickened leaflets . There was mild  regurgitation.  -  Left atrium: The atrium was normal in size.  - Inferior vena cava: The vessel was normal in size. The  respirophasic diameter changes were in the normal range (>= 50%),  consistent with normal central venous pressure.   Impressions:   - LVEF 60-65%, normal wall thickness, normal wall motion, diastolic  dysfunction, indeterminate LV filling pressure, aortic valve  sclerosis with mild AI, mild MR, normal LA size, normal IVC.   CATH:  07/05/2016 Pre-CABG  Mid RCA lesion, 90 %stenosed.  Ost RPDA to RPDA lesion, 70 %stenosed.  Ost 1st Mrg lesion, 80 %stenosed.  Ost Cx to Prox Cx lesion, 90 %stenosed.  Ost LM to LM lesion, 70 %stenosed.  Ost 2nd Mrg to 2nd Mrg lesion, 70 %stenosed.  Ost LAD to Prox LAD lesion, 70 %stenosed.  Lat 2nd Mrg lesion, 99 %stenosed.  Mid LAD lesion, 50 %stenosed.  The left ventricular systolic function is normal.  LV end diastolic pressure is normal.  The left ventricular ejection fraction is greater than 65% by visual estimate.  1. Severe triple vessel CAD 2. Severe stenosis involving the left main artery, ostial Circumflex and ostial/proximal LAD. This is best seen in the caudal view.  3. Severe stenosis mid RCA, PDA and obtuse marginal branch of the Circumflex.  4. Normal LV systolic function.  5. 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 Her anatomy and disease is not favorable for PCI. I will ask CT surgery to see her to discuss CABG. She has had prior open chest surgery. 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 Will need to get records of this surgery.  Diagnostic Dominance: Right     MYOVIEW: 11/02/2020  Nuclear stress EF: 77%.  There was no ST segment deviation noted during stress.  No T wave inversion was noted during stress.  The study is normal.  This is a low risk study.  The left ventricular ejection fraction is hyperdynamic (>65%).  1.  Normal study without ischemia or infarction.  2. Normal LVEF, >65%.  3. This is a low-risk study.   Patient Profile     80 y.o. female with PMH severe CAD with prior CABG, PAD, hyperlipidemia, hypertension who presented with chest pain and fatigue concerning for unstable angina.  Assessment & Plan    Chest pain concerning for unstable angina History of severe CAD s/p CABG 2017 -hsTnI unremarkable x2 -planned for cath today, see consent 02/02/21 -continue clopidogrel -received ASA 81 mg today, per outpatient notes appears she has been on 325 mg in the past -updating echo today  PAD Hyperlipidemia Statin intolerance, ezetimibe intolerance -was on repatha as an outpatient, having issues with PCSK9i coverage, working with Dr. Debara Pickett -has had PAD intervention by Dr. Fletcher Anon. Consider follow up testing as an outpatient -lipids severely abnormal. Labs 1 month ago show Tchol 356, TG 213, HDL 71, LDL 243. -reports that her entire family has heart disease. Highly suggesting of genetic CV/lipid disease. Dr. 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 -lipids prior to PCSK9i showed Tchol 414, TG 201, HDL 64, LDL 308.  Hypertension -on HCTZ, lisinopril as an outpatient. Holding HCTZ for cath  For questions or updates, please contact [place] Please consult [email or web address] for contact info under        Signed, Buford Dresser, MD  02/03/2021, 9:05 AM

## 2021-02-03 NOTE — Progress Notes (Signed)
  Echocardiogram 2D Echocardiogram has been performed.  Katherine Hall 02/03/2021, 8:46 AM

## 2021-02-03 NOTE — Interval H&P Note (Signed)
Cath Lab Visit (complete for each Cath Lab visit)  Clinical Evaluation Leading to the Procedure:   ACS: No.  Non-ACS:    Anginal Classification: CCS III  Anti-ischemic medical therapy: Minimal Therapy (1 class of medications)  Non-Invasive Test Results: No non-invasive testing performed  Prior CABG: Previous CABG      History and Physical Interval Note:  02/03/2021 2:24 PM  Katherine Hall  has presented today for surgery, with the diagnosis of unstable angina.  The various methods of treatment have been discussed with the patient and family. After consideration of risks, benefits and other options for treatment, the patient has consented to  Procedure(s): LEFT HEART CATH AND CORS/GRAFTS ANGIOGRAPHY (N/A) as a surgical intervention.  The patient's history has been reviewed, patient examined, no change in status, stable for surgery.  I have reviewed the patient's chart and labs.  Questions were answered to the patient's satisfaction.     Katherine Hall

## 2021-02-03 NOTE — H&P (View-Only) (Signed)
Progress Note  Patient Name: Katherine Hall Date of Encounter: 02/03/2021  [facility] Cardiologist: Mertie Moores, MD   Subjective   No acute events overnight. Bedside echo being performed this AM. Awaiting cath. No further chest twinges, breathing is stable. She is feeling down due to recent family health stresses.  Inpatient Medications    Scheduled Meds: . clopidogrel  75 mg Oral Daily  . enoxaparin (LOVENOX) injection  40 mg Subcutaneous Q24H  . levothyroxine  50 mcg Oral Daily  . lisinopril  20 mg Oral BID  . naphazoline-pheniramine  1 drop Both Eyes Daily  . pantoprazole  20 mg Oral BID  . rOPINIRole  2 mg Oral QHS  . sodium chloride flush  3 mL Intravenous Q12H  . sodium chloride flush  3 mL Intravenous Q12H   Continuous Infusions: . sodium chloride    . sodium chloride    . sodium chloride 1 mL/kg/hr (02/03/21 0500)   PRN Meds: sodium chloride, sodium chloride, acetaminophen **AND** diphenhydrAMINE, acetaminophen, ALPRAZolam, fluticasone, nitroGLYCERIN, ondansetron (ZOFRAN) IV, sodium chloride flush, sodium chloride flush, traMADol, zolpidem   Vital Signs    Vitals:   02/02/21 2002 02/02/21 2352 02/03/21 0356 02/03/21 0758  BP: 136/60 107/76 (!) 122/58 128/72  Pulse: 85 72 73   Resp: 16 16 16 17   Temp: 98.2 F (36.8 C) 97.8 F (36.6 C) 97.6 F (36.4 C) 98.1 F (36.7 C)  TempSrc: Oral Oral Oral Oral  SpO2: 96% 98% 95%   Weight:   58.9 kg   Height:   4\' 10"  (1.473 m)    No intake or output data in the 24 hours ending 02/03/21 0905 Last 3 Weights 02/03/2021 02/02/2021 02/02/2021  Weight (lbs) 129 lb 12.9 oz 122 lb 12.8 oz 128 lb 11.2 oz  Weight (kg) 58.88 kg 55.702 kg 58.378 kg      Telemetry    NSR with one very brief episode of SVT (9 beats) - Personally Reviewed  ECG    No new since 02/02/21 - Personally Reviewed  Physical Exam   GEN: No acute distress.   Neck: No JVD Cardiac: RRR, no murmurs, rubs, or gallops.  Respiratory: Clear to  auscultation bilaterally. GI: Soft, nontender, non-distended  MS: No edema; No deformity. Neuro:  Nonfocal  Psych: Normal affect   Labs    High Sensitivity Troponin:   Recent Labs  Lab 02/02/21 0905 02/02/21 1110  TROPONINIHS 4 5      Chemistry Recent Labs  Lab 02/02/21 0905 02/03/21 0204  NA 133* 132*  K 4.2 3.9  CL 97* 96*  CO2 26 24  GLUCOSE 117* 89  BUN 20 16  CREATININE 0.92 0.80  CALCIUM 9.1 9.2  PROT  --  5.6*  ALBUMIN  --  3.5  AST  --  13*  ALT  --  11  ALKPHOS  --  30*  BILITOT  --  0.7  GFRNONAA >60 >60  ANIONGAP 10 12     Hematology Recent Labs  Lab 02/02/21 0905  WBC 5.9  RBC 3.75*  HGB 11.9*  HCT 35.9*  MCV 95.7  MCH 31.7  MCHC 33.1  RDW 13.3  PLT 342    BNPNo results for input(s): BNP, PROBNP in the last 168 hours.   DDimer No results for input(s): DDIMER in the last 168 hours.   Radiology    US Venous Img Lower Right (DVT Study)  Result Date: 02/02/2021 CLINICAL DATA:  Right calf pain. EXAM: RIGHT LOWER EXTREMITY VENOUS  DOPPLER ULTRASOUND TECHNIQUE: Gray-scale sonography with graded compression, as well as color Doppler and duplex ultrasound were performed to evaluate the lower extremity deep venous systems from the level of the common femoral vein and including the common femoral, femoral, profunda femoral, popliteal and calf veins including the posterior tibial, peroneal and gastrocnemius veins when visible. The superficial great saphenous vein was also interrogated. Spectral Doppler was utilized to evaluate flow at rest and with distal augmentation maneuvers in the common femoral, femoral and popliteal veins. COMPARISON:  None. FINDINGS: Contralateral Common Femoral Vein: Respiratory phasicity is normal and symmetric with the symptomatic side. No evidence of thrombus. Normal compressibility. Common Femoral Vein: No evidence of thrombus. Normal compressibility, respiratory phasicity and response to augmentation. Saphenofemoral Junction:  No evidence of thrombus. Normal compressibility and flow on color Doppler imaging. Profunda Femoral Vein: No evidence of thrombus. Normal compressibility and flow on color Doppler imaging. Femoral Vein: No evidence of thrombus. Normal compressibility, respiratory phasicity and response to augmentation. Popliteal Vein: No evidence of thrombus. Normal compressibility, respiratory phasicity and response to augmentation. Calf Veins: No evidence of thrombus. Normal compressibility and flow on color Doppler imaging. Superficial Great Saphenous Vein: No evidence of thrombus. Normal compressibility. Venous Reflux:  None. Other Findings: No evidence of superficial thrombophlebitis or abnormal fluid collection. IMPRESSION: No evidence of right lower extremity deep venous thrombosis. Electronically Signed   By: Aletta Edouard M.D.   On: 02/02/2021 09:56   DG Chest Portable 1 View  Result Date: 02/02/2021 CLINICAL DATA:  Chest pain, shortness of breath EXAM: PORTABLE CHEST 1 VIEW COMPARISON:  09/24/2020 FINDINGS: Prior CABG. Heart and mediastinal contours are within normal limits. No focal opacities or effusions. No acute bony abnormality. IMPRESSION: No active disease. Electronically Signed   By: Rolm Baptise M.D.   On: 02/02/2021 09:18    Cardiac Studies   ECHO: 07/05/2016 - Left ventricle: The cavity size was normal. Wall thickness was  normal. Systolic function was normal. The estimated ejection  fraction was in the range of 60% to 65%. LV mid-ventricle false  tendon. Wall motion was normal; there were no regional wall  motion abnormalities. Doppler parameters are consistent with  abnormal left ventricular relaxation (grade 1 diastolic  dysfunction). The E/e&' ratio is between 8-15, suggesting  indeterminate LV filling pressure.  - Aortic valve: Sclerosis without stenosis. There was mild  regurgitation.  - Mitral valve: Mildly thickened leaflets . There was mild  regurgitation.  -  Left atrium: The atrium was normal in size.  - Inferior vena cava: The vessel was normal in size. The  respirophasic diameter changes were in the normal range (>= 50%),  consistent with normal central venous pressure.   Impressions:   - LVEF 60-65%, normal wall thickness, normal wall motion, diastolic  dysfunction, indeterminate LV filling pressure, aortic valve  sclerosis with mild AI, mild MR, normal LA size, normal IVC.   CATH:  07/05/2016 Pre-CABG  Mid RCA lesion, 90 %stenosed.  Ost RPDA to RPDA lesion, 70 %stenosed.  Ost 1st Mrg lesion, 80 %stenosed.  Ost Cx to Prox Cx lesion, 90 %stenosed.  Ost LM to LM lesion, 70 %stenosed.  Ost 2nd Mrg to 2nd Mrg lesion, 70 %stenosed.  Ost LAD to Prox LAD lesion, 70 %stenosed.  Lat 2nd Mrg lesion, 99 %stenosed.  Mid LAD lesion, 50 %stenosed.  The left ventricular systolic function is normal.  LV end diastolic pressure is normal.  The left ventricular ejection fraction is greater than 65% by visual estimate.  1. Severe triple vessel CAD 2. Severe stenosis involving the left main artery, ostial Circumflex and ostial/proximal LAD. This is best seen in the caudal view.  3. Severe stenosis mid RCA, PDA and obtuse marginal branch of the Circumflex.  4. Normal LV systolic function.  5. 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 Her anatomy and disease is not favorable for PCI. I will ask CT surgery to see her to discuss CABG. She has had prior open chest surgery. 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 Will need to get records of this surgery.  Diagnostic Dominance: Right     MYOVIEW: 11/02/2020  Nuclear stress EF: 77%.  There was no ST segment deviation noted during stress.  No T wave inversion was noted during stress.  The study is normal.  This is a low risk study.  The left ventricular ejection fraction is hyperdynamic (>65%).  1.  Normal study without ischemia or infarction.  2. Normal LVEF, >65%.  3. This is a low-risk study.   Patient Profile     80 y.o. female with PMH severe CAD with prior CABG, PAD, hyperlipidemia, hypertension who presented with chest pain and fatigue concerning for unstable angina.  Assessment & Plan    Chest pain concerning for unstable angina History of severe CAD s/p CABG 2017 -hsTnI unremarkable x2 -planned for cath today, see consent 02/02/21 -continue clopidogrel -received ASA 81 mg today, per outpatient notes appears she has been on 325 mg in the past -updating echo today  PAD Hyperlipidemia Statin intolerance, ezetimibe intolerance -was on repatha as an outpatient, having issues with PCSK9i coverage, working with Dr. Debara Pickett -has had PAD intervention by Dr. Fletcher Anon. Consider follow up testing as an outpatient -lipids severely abnormal. Labs 1 month ago show Tchol 356, TG 213, HDL 71, LDL 243. -reports that her entire family has heart disease. Highly suggesting of genetic CV/lipid disease. Dr. 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 -lipids prior to PCSK9i showed Tchol 414, TG 201, HDL 64, LDL 308.  Hypertension -on HCTZ, lisinopril as an outpatient. Holding HCTZ for cath  For questions or updates, please contact [place] Please consult [email or web address] for contact info under        Signed, Buford Dresser, MD  02/03/2021, 9:05 AM

## 2021-02-04 ENCOUNTER — Encounter (HOSPITAL_COMMUNITY): Payer: Self-pay | Admitting: Cardiovascular Disease

## 2021-02-04 DIAGNOSIS — I2510 Atherosclerotic heart disease of native coronary artery without angina pectoris: Secondary | ICD-10-CM | POA: Diagnosis not present

## 2021-02-04 DIAGNOSIS — R7303 Prediabetes: Secondary | ICD-10-CM | POA: Diagnosis present

## 2021-02-04 DIAGNOSIS — Z20822 Contact with and (suspected) exposure to covid-19: Secondary | ICD-10-CM | POA: Diagnosis present

## 2021-02-04 DIAGNOSIS — I739 Peripheral vascular disease, unspecified: Secondary | ICD-10-CM | POA: Diagnosis present

## 2021-02-04 DIAGNOSIS — Z91040 Latex allergy status: Secondary | ICD-10-CM | POA: Diagnosis not present

## 2021-02-04 DIAGNOSIS — I10 Essential (primary) hypertension: Secondary | ICD-10-CM | POA: Diagnosis present

## 2021-02-04 DIAGNOSIS — E782 Mixed hyperlipidemia: Secondary | ICD-10-CM | POA: Diagnosis not present

## 2021-02-04 DIAGNOSIS — I200 Unstable angina: Secondary | ICD-10-CM | POA: Diagnosis not present

## 2021-02-04 DIAGNOSIS — E7800 Pure hypercholesterolemia, unspecified: Secondary | ICD-10-CM | POA: Diagnosis present

## 2021-02-04 DIAGNOSIS — Z886 Allergy status to analgesic agent status: Secondary | ICD-10-CM | POA: Diagnosis not present

## 2021-02-04 DIAGNOSIS — E7801 Familial hypercholesterolemia: Secondary | ICD-10-CM | POA: Diagnosis not present

## 2021-02-04 DIAGNOSIS — Z7983 Long term (current) use of bisphosphonates: Secondary | ICD-10-CM | POA: Diagnosis not present

## 2021-02-04 DIAGNOSIS — Z79899 Other long term (current) drug therapy: Secondary | ICD-10-CM | POA: Diagnosis not present

## 2021-02-04 DIAGNOSIS — I358 Other nonrheumatic aortic valve disorders: Secondary | ICD-10-CM | POA: Diagnosis present

## 2021-02-04 DIAGNOSIS — Z880 Allergy status to penicillin: Secondary | ICD-10-CM | POA: Diagnosis not present

## 2021-02-04 DIAGNOSIS — Z91048 Other nonmedicinal substance allergy status: Secondary | ICD-10-CM | POA: Diagnosis not present

## 2021-02-04 DIAGNOSIS — Z883 Allergy status to other anti-infective agents status: Secondary | ICD-10-CM | POA: Diagnosis not present

## 2021-02-04 DIAGNOSIS — Z7902 Long term (current) use of antithrombotics/antiplatelets: Secondary | ICD-10-CM | POA: Diagnosis not present

## 2021-02-04 DIAGNOSIS — Z888 Allergy status to other drugs, medicaments and biological substances status: Secondary | ICD-10-CM | POA: Diagnosis not present

## 2021-02-04 DIAGNOSIS — Z8249 Family history of ischemic heart disease and other diseases of the circulatory system: Secondary | ICD-10-CM | POA: Diagnosis not present

## 2021-02-04 DIAGNOSIS — I25110 Atherosclerotic heart disease of native coronary artery with unstable angina pectoris: Secondary | ICD-10-CM | POA: Diagnosis present

## 2021-02-04 DIAGNOSIS — Z7982 Long term (current) use of aspirin: Secondary | ICD-10-CM | POA: Diagnosis not present

## 2021-02-04 DIAGNOSIS — M79604 Pain in right leg: Secondary | ICD-10-CM | POA: Diagnosis present

## 2021-02-04 DIAGNOSIS — I959 Hypotension, unspecified: Secondary | ICD-10-CM | POA: Diagnosis not present

## 2021-02-04 DIAGNOSIS — E039 Hypothyroidism, unspecified: Secondary | ICD-10-CM | POA: Diagnosis present

## 2021-02-04 DIAGNOSIS — Z951 Presence of aortocoronary bypass graft: Secondary | ICD-10-CM | POA: Diagnosis not present

## 2021-02-04 DIAGNOSIS — Z79890 Hormone replacement therapy (postmenopausal): Secondary | ICD-10-CM | POA: Diagnosis not present

## 2021-02-04 LAB — BASIC METABOLIC PANEL
Anion gap: 9 (ref 5–15)
BUN: 10 mg/dL (ref 8–23)
CO2: 23 mmol/L (ref 22–32)
Calcium: 8.9 mg/dL (ref 8.9–10.3)
Chloride: 100 mmol/L (ref 98–111)
Creatinine, Ser: 0.87 mg/dL (ref 0.44–1.00)
GFR, Estimated: >60 mL/min (ref >60)
Glucose, Bld: 100 mg/dL — ABNORMAL HIGH (ref 70–99)
Potassium: 3.6 mmol/L (ref 3.5–5.1)
Sodium: 132 mmol/L — ABNORMAL LOW (ref 135–145)

## 2021-02-04 LAB — CBC
HCT: 32.2 % — ABNORMAL LOW (ref 36.0–46.0)
Hemoglobin: 10.4 g/dL — ABNORMAL LOW (ref 12.0–15.0)
MCH: 31 pg (ref 26.0–34.0)
MCHC: 32.3 g/dL (ref 30.0–36.0)
MCV: 95.8 fL (ref 80.0–100.0)
Platelets: 301 10*3/uL (ref 150–400)
RBC: 3.36 MIL/uL — ABNORMAL LOW (ref 3.87–5.11)
RDW: 13.1 % (ref 11.5–15.5)
WBC: 6.7 10*3/uL (ref 4.0–10.5)
nRBC: 0 % (ref 0.0–0.2)

## 2021-02-04 MED ORDER — ISOSORBIDE MONONITRATE ER 30 MG PO TB24
30.0000 mg | ORAL_TABLET | Freq: Every day | ORAL | Status: DC
  Administered 2021-02-04 – 2021-02-05 (×2): 30 mg via ORAL
  Filled 2021-02-04 (×2): qty 1

## 2021-02-04 MED FILL — Nitroglycerin IV Soln 100 MCG/ML in D5W: INTRA_ARTERIAL | Qty: 10 | Status: AC

## 2021-02-04 MED FILL — Verapamil HCl IV Soln 2.5 MG/ML: INTRAVENOUS | Qty: 2 | Status: AC

## 2021-02-04 NOTE — Progress Notes (Signed)
Progress Note  Patient Name: Katherine Hall Date of Encounter: 02/04/2021  [facility] Cardiologist: Mertie Moores, MD   Subjective   Reviewed events of cath yesterday. She had neck/throat tightness when her graft closed. This resolved overnight on nitro. She has had mild intermittent recurrences but no severe discomfort this AM. She is off nitro drip.  We reviewed her cath results, reason for her angina, management options. Reviewed use of nitro and how it helps. She tolerated drip well, no headaches. BP borderline this AM but she feels well.  Inpatient Medications    Scheduled Meds: . aspirin  81 mg Oral Daily  . Chlorhexidine Gluconate Cloth  6 each Topical Daily  . clopidogrel  75 mg Oral Daily  . enoxaparin (LOVENOX) injection  40 mg Subcutaneous Q24H  . isosorbide mononitrate  30 mg Oral Daily  . levothyroxine  50 mcg Oral Daily  . lisinopril  20 mg Oral BID  . naphazoline-pheniramine  1 drop Both Eyes Daily  . pantoprazole  20 mg Oral BID  . rOPINIRole  2 mg Oral QHS  . sodium chloride flush  3 mL Intravenous Q12H  . sodium chloride flush  3 mL Intravenous Q12H   Continuous Infusions: . sodium chloride    . sodium chloride     PRN Meds: sodium chloride, sodium chloride, acetaminophen **AND** diphenhydrAMINE, acetaminophen, ALPRAZolam, fluticasone, nitroGLYCERIN, ondansetron (ZOFRAN) IV, sodium chloride flush, sodium chloride flush, traMADol   Vital Signs    Vitals:   02/04/21 0800 02/04/21 0830 02/04/21 0900 02/04/21 0930  BP: (!) 86/35 (!) 100/50 (!) 109/47 133/74  Pulse: 80 72 74 73  Resp: 16 15 13 16   Temp:      TempSrc:      SpO2: 100% 100% 98% 97%  Weight:      Height:        Intake/Output Summary (Last 24 hours) at 02/04/2021 0945 Last data filed at 02/04/2021 0300 Gross per 24 hour  Intake 187.83 ml  Output 751 ml  Net -563.17 ml   Last 3 Weights 02/04/2021 02/03/2021 02/02/2021  Weight (lbs) 127 lb 10.3 oz 129 lb 12.9 oz 122 lb 12.8 oz  Weight (kg)  57.9 kg 58.88 kg 55.702 kg      Telemetry    NSR with intermittent PVCs - Personally Reviewed  ECG    3/3 NSR with q waves and minimal ST elevations in inferior leads - Personally Reviewed  Physical Exam   GEN: Well nourished, well developed in no acute distress NECK: No JVD CARDIAC: regular rhythm, normal S1 and S2, no rubs or gallops. No murmur. VASCULAR: Radial pulses 2+ bilaterally. R radial and femoral cath site c/d/i RESPIRATORY:  Clear to auscultation without rales, wheezing or rhonchi  ABDOMEN: Soft, non-tender, non-distended MUSCULOSKELETAL:  Moves all 4 limbs independently SKIN: Warm and dry, no edema NEUROLOGIC:  No focal neuro deficits noted. PSYCHIATRIC:  Normal affect   Labs    High Sensitivity Troponin:   Recent Labs  Lab 02/02/21 0905 02/02/21 1110  TROPONINIHS 4 5      Chemistry Recent Labs  Lab 02/02/21 0905 02/03/21 0204 02/04/21 0043  NA 133* 132* 132*  K 4.2 3.9 3.6  CL 97* 96* 100  CO2 26 24 23   GLUCOSE 117* 89 100*  BUN 20 16 10   CREATININE 0.92 0.80 0.87  CALCIUM 9.1 9.2 8.9  PROT  --  5.6*  --   ALBUMIN  --  3.5  --   AST  --  13*  --   ALT  --  11  --   ALKPHOS  --  30*  --   BILITOT  --  0.7  --   GFRNONAA >60 >60 >60  ANIONGAP 10 12 9      Hematology Recent Labs  Lab 02/02/21 0905 02/04/21 0043  WBC 5.9 6.7  RBC 3.75* 3.36*  HGB 11.9* 10.4*  HCT 35.9* 32.2*  MCV 95.7 95.8  MCH 31.7 31.0  MCHC 33.1 32.3  RDW 13.3 13.1  PLT 342 301    BNPNo results for input(s): BNP, PROBNP in the last 168 hours.   DDimer No results for input(s): DDIMER in the last 168 hours.   Radiology    CARDIAC CATHETERIZATION  Result Date: 02/03/2021  Prox LAD to Mid LAD lesion is 100% stenosed.  Mid Cx lesion is 100% stenosed.  1st Mrg lesion is 100% stenosed.  Prox RCA lesion is 40% stenosed.  Prox RCA to Mid RCA lesion is 70% stenosed.  RPDA lesion is 100% stenosed.  RPAV lesion is 90% stenosed.  Dist RCA lesion is 60% stenosed.   SVG and is normal in caliber.  The graft exhibits no disease.  SVG.  Insertion lesion is 60% stenosed.  LIMA and is normal in caliber.  The graft exhibits no disease.  Origin to Prox Graft lesion is 90% stenosed.  Mid Graft lesion is 95% stenosed.  Mid Graft to Dist Graft lesion is 40% stenosed.  Post intervention, there is a 70% residual stenosis.  Balloon angioplasty was performed using a BALLOON SAPPHIRE 2.5X12.  Prox Graft lesion is 90% stenosed.  1.  Severe underlying three-vessel coronary artery disease with patent grafts including LIMA to LAD, SVG to OM1/OM 3, SVG to diagonal with 60% anastomosis stenosis and SVG to right PDA.  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 2.  Left ventricular angiography was not performed.  EF was normal by echo.  Normal LVEDP. 3.  Unsuccessful attempted angioplasty to SVG to right PDA due to inability to cross the stenosis beyond the aneurysmal segment.  There was loss of flow and acute closure with wire manipulation.  Multiple CTO wires were used but with no success.  The patient developed mild inferior ST elevation on the monitor with throat discomfort.  Symptoms improved with nitroglycerin drip. Recommendations: The patient will have an inferior infarct although the right PDA distribution is relatively small.  The native RCA is diffusely diseased and tortuous and likely not suitable for PCI. I elected to place the patient on nitroglycerin drip and will continue medical therapy for her coronary artery disease. The procedure was performed via the right common femoral artery after unsuccessful access via the left radial artery due to suspected radial loop.   US Venous Img Lower Right (DVT Study)  Result Date: 02/02/2021 CLINICAL DATA:  Right calf pain. EXAM: RIGHT LOWER EXTREMITY VENOUS DOPPLER ULTRASOUND TECHNIQUE: Gray-scale sonography with graded  compression, as well as color Doppler and duplex ultrasound were performed to evaluate the lower extremity deep venous systems from the level of the common femoral vein and including the common femoral, femoral, profunda femoral, popliteal and calf veins including the posterior tibial, peroneal and gastrocnemius veins when visible. The superficial great saphenous vein was also interrogated. Spectral Doppler was utilized to evaluate flow at rest and with distal augmentation maneuvers in the common femoral, femoral and popliteal veins. COMPARISON:  None. FINDINGS: Contralateral Common Femoral Vein: Respiratory phasicity is normal and symmetric with the symptomatic side. No evidence of thrombus. Normal compressibility. Common Femoral Vein: No evidence of thrombus. Normal compressibility, respiratory phasicity and response to augmentation. Saphenofemoral Junction: No evidence of thrombus. Normal compressibility and flow on color Doppler imaging. Profunda Femoral Vein: No evidence of thrombus. Normal compressibility and flow on color Doppler imaging. Femoral Vein: No evidence of thrombus. Normal compressibility, respiratory phasicity and response to augmentation. Popliteal Vein: No evidence of thrombus. Normal compressibility, respiratory phasicity and response to augmentation. Calf Veins: No evidence of thrombus. Normal compressibility and flow on color Doppler imaging. Superficial Great Saphenous Vein: No evidence of thrombus. Normal compressibility. Venous Reflux:  None. Other Findings: No evidence of superficial thrombophlebitis or abnormal fluid collection. IMPRESSION: No evidence of right lower extremity deep venous thrombosis. Electronically Signed   By: Aletta Edouard M.D.   On: 02/02/2021 09:56   ECHOCARDIOGRAM COMPLETE  Result Date: 02/03/2021    ECHOCARDIOGRAM REPORT   Patient Name:   Katherine Hall Date of Exam: 02/03/2021 Medical Rec #:  [id number]     Height:       58.0 in Accession #:    [id number]     Weight:       129.8 lb Date of Birth:  1941-11-25      BSA:          1.515 m Patient Age:    80 years      BP:           122/58 mmHg Patient Gender: F             HR:           74 bpm. Exam Location:  Inpatient Procedure: 2D Echo, Cardiac Doppler and Color Doppler Indications:    Chest Pain R07.9  History:        Patient has prior history of Echocardiogram examinations, most                 recent 07/07/2016. CAD; Risk Factors:Hypertension, Dyslipidemia                 and Non-Smoker.  Sonographer:    Vickie Epley RDCS Referring Phys: [address] [place]  1. Left ventricular ejection fraction, by estimation, is 65 to 70%. The left ventricle has hyperdynamic function. The left ventricle has no regional wall motion abnormalities. There is mild left ventricular hypertrophy. Left ventricular diastolic parameters are consistent with Grade I diastolic dysfunction (impaired relaxation).  2. Right ventricular systolic function is normal. The right ventricular size is normal. There is normal pulmonary artery systolic pressure. The estimated right ventricular systolic pressure is 35.5 mmHg.  3. The mitral valve is normal in structure. No evidence of mitral valve regurgitation. No evidence of mitral stenosis.  4. The aortic valve is tricuspid. Aortic valve regurgitation is mild. Mild aortic valve sclerosis is present, with no evidence of aortic valve stenosis.  5. The inferior vena cava is normal in size with greater than 50% respiratory variability, suggesting right atrial pressure of 3 mmHg. FINDINGS  Left Ventricle: Left ventricular ejection fraction, by estimation, is 65 to 70%. The left ventricle has hyperdynamic function. The left ventricle has no regional wall motion abnormalities. The left ventricular internal cavity size was normal in size. There is mild left ventricular hypertrophy. Left ventricular diastolic parameters are consistent with Grade I diastolic dysfunction (impaired relaxation). Right  Ventricle: The right ventricular size is normal. No increase in right  ventricular wall thickness. Right ventricular systolic function is normal. There is normal pulmonary artery systolic pressure. The tricuspid regurgitant velocity is 2.03 m/s, and  with an assumed right atrial pressure of 3 mmHg, the estimated right ventricular systolic pressure is 23.7 mmHg. Left Atrium: Left atrial size was normal in size. Right Atrium: Right atrial size was normal in size. Pericardium: There is no evidence of pericardial effusion. Mitral Valve: The mitral valve is normal in structure. No evidence of mitral valve regurgitation. No evidence of mitral valve stenosis. Tricuspid Valve: The tricuspid valve is normal in structure. Tricuspid valve regurgitation is trivial. Aortic Valve: The aortic valve is tricuspid. Aortic valve regurgitation is mild. Aortic regurgitation PHT measures 518 msec. Mild aortic valve sclerosis is present, with no evidence of aortic valve stenosis. Pulmonic Valve: The pulmonic valve was normal in structure. Pulmonic valve regurgitation is not visualized. Aorta: The aortic root is normal in size and structure. Venous: The inferior vena cava is normal in size with greater than 50% respiratory variability, suggesting right atrial pressure of 3 mmHg. IAS/Shunts: No atrial level shunt detected by color flow Doppler.  LEFT VENTRICLE PLAX 2D LVIDd:         3.30 cm     Diastology LVIDs:         2.40 cm     LV e' medial:    3.81 cm/s LV PW:         0.80 cm     LV E/e' medial:  14.2 LV IVS:        0.80 cm     LV e' lateral:   7.78 cm/s LVOT diam:     2.00 cm     LV E/e' lateral: 6.9 LV SV:         63 LV SV Index:   42 LVOT Area:     3.14 cm  LV Volumes (MOD) LV vol d, MOD A2C: 47.4 ml LV vol d, MOD A4C: 53.6 ml LV vol s, MOD A2C: 21.7 ml LV vol s, MOD A4C: 19.4 ml LV SV MOD A2C:     25.7 ml LV SV MOD A4C:     53.6 ml LV SV MOD BP:      29.6 ml RIGHT VENTRICLE RV S prime:     11.20 cm/s TAPSE (M-mode): 1.6 cm LEFT  ATRIUM             Index       RIGHT ATRIUM           Index LA diam:        3.00 cm 1.98 cm/m  RA Area:     10.30 cm LA Vol (A2C):   24.9 ml 16.43 ml/m RA Volume:   19.10 ml  12.60 ml/m LA Vol (A4C):   23.7 ml 15.64 ml/m LA Biplane Vol: 25.2 ml 16.63 ml/m  AORTIC VALVE LVOT Vmax:   102.00 cm/s LVOT Vmean:  69.700 cm/s LVOT VTI:    0.202 m AI PHT:      518 msec  AORTA Ao Root diam: 2.60 cm MITRAL VALVE               TRICUSPID VALVE MV Area (PHT): 3.08 cm    TR Peak grad:   16.5 mmHg MV Decel Time: 246 msec    TR Vmax:        203.00 cm/s MV E velocity: 54.00 cm/s MV A velocity: 84.00 cm/s  SHUNTS MV E/A ratio:  0.64  Systemic VTI:  0.20 m                            Systemic Diam: 2.00 cm Loralie Champagne MD Electronically signed by Loralie Champagne MD Signature Date/Time: 2021/02/07/2:11:58 PM    Final     Cardiac Studies   Left heart cath/grafts 02/07/21  Prox LAD to Mid LAD lesion is 100% stenosed.  Mid Cx lesion is 100% stenosed.  1st Mrg lesion is 100% stenosed.  Prox RCA lesion is 40% stenosed.  Prox RCA to Mid RCA lesion is 70% stenosed.  RPDA lesion is 100% stenosed.  RPAV lesion is 90% stenosed.  Dist RCA lesion is 60% stenosed.  SVG and is normal in caliber.  The graft exhibits no disease.  SVG.  Insertion lesion is 60% stenosed.  LIMA and is normal in caliber.  The graft exhibits no disease.  Origin to Prox Graft lesion is 90% stenosed.  Mid Graft lesion is 95% stenosed.  Mid Graft to Dist Graft lesion is 40% stenosed.  Post intervention, there is a 70% residual stenosis.  Balloon angioplasty was performed using a BALLOON SAPPHIRE 2.5X12.  Prox Graft lesion is 90% stenosed.   1.  Severe underlying three-vessel coronary artery disease with patent grafts including LIMA to LAD, SVG to OM1/OM 3, SVG to diagonal with 60% anastomosis stenosis and SVG to right PDA.  The SVG to right PDA has a 90% stenosis proximally followed by another 90% stenosis leading into an  aneurysmal segment which is followed by 95% stenosis. 2.  Left ventricular angiography was not performed.  EF was normal by echo.  Normal LVEDP. 3.  Unsuccessful attempted angioplasty to SVG to right PDA due to inability to cross the stenosis beyond the aneurysmal segment.  There was loss of flow and acute closure with wire manipulation.  Multiple CTO wires were used but with no success.  The patient developed mild inferior ST elevation on the monitor with throat discomfort.  Symptoms improved with nitroglycerin drip.  Recommendations: The patient will have an inferior infarct although the right PDA distribution is relatively small.  The native RCA is diffusely diseased and tortuous and likely not suitable for PCI. I elected to place the patient on nitroglycerin drip and will continue medical therapy for her coronary artery disease.  The procedure was performed via the right common femoral artery after unsuccessful access via the left radial artery due to suspected radial loop.  Coronary Diagrams   Diagnostic Dominance: Right    Intervention     Echo Feb 07, 2021 1. Left ventricular ejection fraction, by estimation, is 65 to 70%. The  left ventricle has hyperdynamic function. The left ventricle has no  regional wall motion abnormalities. There is mild left ventricular  hypertrophy. Left ventricular diastolic  parameters are consistent with Grade I diastolic dysfunction (impaired  relaxation).  2. Right ventricular systolic function is normal. The right ventricular  size is normal. There is normal pulmonary artery systolic pressure. The  estimated right ventricular systolic pressure is 22.6 mmHg.  3. The mitral valve is normal in structure. No evidence of mitral valve  regurgitation. No evidence of mitral stenosis.  4. The aortic valve is tricuspid. Aortic valve regurgitation is mild.  Mild aortic valve sclerosis is present, with no evidence of aortic valve  stenosis.  5. The  inferior vena cava is normal in size with greater than 50%  respiratory variability, suggesting right atrial pressure of 3 mmHg.  ECHO: 07/05/2016 - Left ventricle: The cavity size was normal. Wall thickness was  normal. Systolic function was normal. The estimated ejection  fraction was in the range of 60% to 65%. LV mid-ventricle false  tendon. Wall motion was normal; there were no regional wall  motion abnormalities. Doppler parameters are consistent with  abnormal left ventricular relaxation (grade 1 diastolic  dysfunction). The E/e&' ratio is between 8-15, suggesting  indeterminate LV filling pressure.  - Aortic valve: Sclerosis without stenosis. There was mild  regurgitation.  - Mitral valve: Mildly thickened leaflets . There was mild  regurgitation.  - Left atrium: The atrium was normal in size.  - Inferior vena cava: The vessel was normal in size. The  respirophasic diameter changes were in the normal range (>= 50%),  consistent with normal central venous pressure.   Impressions:   - LVEF 60-65%, normal wall thickness, normal wall motion, diastolic  dysfunction, indeterminate LV filling pressure, aortic valve  sclerosis with mild AI, mild MR, normal LA size, normal IVC.   CATH:  07/05/2016 Pre-CABG  Mid RCA lesion, 90 %stenosed.  Ost RPDA to RPDA lesion, 70 %stenosed.  Ost 1st Mrg lesion, 80 %stenosed.  Ost Cx to Prox Cx lesion, 90 %stenosed.  Ost LM to LM lesion, 70 %stenosed.  Ost 2nd Mrg to 2nd Mrg lesion, 70 %stenosed.  Ost LAD to Prox LAD lesion, 70 %stenosed.  Lat 2nd Mrg lesion, 99 %stenosed.  Mid LAD lesion, 50 %stenosed.  The left ventricular systolic function is normal.  LV end diastolic pressure is normal.  The left ventricular ejection fraction is greater than 65% by visual estimate.  1. Severe triple vessel CAD 2. Severe stenosis involving the left main artery, ostial Circumflex and ostial/proximal LAD. This is  best seen in the caudal view.  3. Severe stenosis mid RCA, PDA and obtuse marginal branch of the Circumflex.  4. Normal LV systolic function.  5. 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 Her anatomy and disease is not favorable for PCI. I will ask CT surgery to see her to discuss CABG. She has had prior open chest surgery. 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 Will need to get records of this surgery.  Diagnostic Dominance: Right     MYOVIEW: 11/02/2020  Nuclear stress EF: 77%.  There was no ST segment deviation noted during stress.  No T wave inversion was noted during stress.  The study is normal.  This is a low risk study.  The left ventricular ejection fraction is hyperdynamic (>65%).  1. Normal study without ischemia or infarction.  2. Normal LVEF, >65%.  3. This is a low-risk study.   Patient Profile     80 y.o. female with PMH severe CAD with prior CABG, PAD, hyperlipidemia, hypertension who presented with chest pain and fatigue concerning for unstable angina. S/P cath and attempted PCI 3/2, now with complete closure of SVG to PDA.  Assessment & Plan    Chest pain concerning for unstable angina History of severe CAD s/p CABG 2017 S/P cath and attempted PCI 02/03/21 with complete closure of SVG to PDA graft -we reviewed her cath findings. Unfortunately there was a very tight stenosis in her SVG graft to PDA after an aneurysmal segment. Intervention was attempted on this, but there was complete graft closure with instrumentation -she is having a small inferior infarct with loss of flow to the area supplied by the SVG to PDA. ECG  and telemetry show this, with inferior Q waves and resolving ST elevation. -continue clopidogrel, aspirin 81 mg. Given the extent of disease, would continue indefinitely -we discussed angina and that her throat pain is consistent with this. Discussed signs/symptoms to watch  for. Discussed when to use SL NG and getting a new prescription for this -we will try a low dose of imdur, as she did have relief on the nitro drip -echo with preserved EF -confirmed with Dr. Fletcher Anon, no utility for heparin as graft is completely occluded  PAD Hyperlipidemia Statin intolerance, ezetimibe intolerance -was on repatha as an outpatient, having issues with PCSK9i coverage, working with Dr. Debara Pickett -has had PAD intervention by Dr. Fletcher Anon. Consider follow up testing as an outpatient -lipids severely abnormal. Labs 1 month ago show Tchol 356, TG 213, HDL 71, LDL 243. -reports that her entire family has heart disease. Highly suggesting of genetic CV/lipid disease. Dr. 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 -lipids prior to PCSK9i showed Tchol 414, TG 201, HDL 64, LDL 308.  Hypertension -on HCTZ, lisinopril as an outpatient -blood pressures lower in hospital than at home -continue to hold HCTZ -started imdur as above  We will transfer her out of the ICU to a progressive cardiology bed.  Complex medical decision making with high risk condition, active inferior MI given closure  Total time of encounter: 45 minutes total time of encounter, including 35 minutes spent in face-to-face patient care. This time includes coordination of care and counseling regarding cath findings, angina education, management plans. Remainder of non-face-to-face time involved reviewing chart documents/testing relevant to the patient encounter and documentation in the medical record.  Buford Dresser, MD, PhD, [place]   For questions or updates, please contact [place] Please consult [email or web address] for contact info under     Signed, Buford Dresser, MD  02/04/2021, 9:45 AM

## 2021-02-04 NOTE — Progress Notes (Signed)
Came to ambulate however pt tells me she is having CP. Sts left chest into her neck/jaw. Sts 4/10. Started after she ate but clarifies that it does not feel like indigestion. Notified RN. Will hold ambulation for now. Will f/u tomorrow. [place], ACSM 1:27 PM 02/04/2021

## 2021-02-05 DIAGNOSIS — E7801 Familial hypercholesterolemia: Secondary | ICD-10-CM

## 2021-02-05 DIAGNOSIS — I2583 Coronary atherosclerosis due to lipid rich plaque: Secondary | ICD-10-CM

## 2021-02-05 DIAGNOSIS — I2510 Atherosclerotic heart disease of native coronary artery without angina pectoris: Secondary | ICD-10-CM

## 2021-02-05 DIAGNOSIS — I959 Hypotension, unspecified: Secondary | ICD-10-CM

## 2021-02-05 MED ORDER — CLOPIDOGREL BISULFATE 75 MG PO TABS: 75.0000 mg | ORAL_TABLET | Freq: Every day | ORAL | 3 refills | Status: DC

## 2021-02-05 MED ORDER — ISOSORBIDE MONONITRATE ER 30 MG PO TB24: 30.0000 mg | ORAL_TABLET | Freq: Every day | ORAL | 3 refills | Status: DC

## 2021-02-05 MED ORDER — ASPIRIN 81 MG PO CHEW: 81.0000 mg | CHEWABLE_TABLET | Freq: Every day | ORAL | Status: DC

## 2021-02-05 NOTE — Progress Notes (Signed)
CARDIAC REHAB PHASE I   PRE:  Rate/Rhythm: 72 SR    BP: sitting 124/73    SaO2: 97 RA  MODE:  Ambulation: 400 ft   POST:  Rate/Rhythm: 86 SR    BP: sitting 161/69     SaO2: 100 RA  Pt able to ambulate without CP. Does st some SOB and fatigue, rested x3. Long distance. Used gait belt with min assist. Sts she generally "touches" furniture when she walks at home.   Discussed restrictions, NTG, exercise, diet, and CRPII. Did not discuss MI since not documented as such. Pt uninterested in CRPII therefore will not place referral. She will start exercising at home.  [place], ACSM 02/05/2021 10:51 AM

## 2021-02-05 NOTE — Plan of Care (Signed)
  Problem: Education: Goal: Knowledge of General Education information will improve Description: Including pain rating scale, medication(s)/side effects and non-pharmacologic comfort measures Outcome: Adequate for Discharge   

## 2021-02-05 NOTE — Progress Notes (Signed)
Progress Note  Patient Name: Katherine Hall Date of Encounter: 02/05/2021  [facility] Cardiologist: Mertie Moores, MD   Subjective   Has brief neck/throat tightness yesterday. Also had left chest tenderness to palpation. Reviewed her anginal equivalents. Left chest pain better with tylenol. Her blood pressure has been running low, outpatient BP meds on hold and only on low dose imdur.  She has not had further symptoms. She walked with cardiac rehab and did well. We reviewed plans for medications, anginal symptoms, etc today.  Inpatient Medications    Scheduled Meds: . aspirin  81 mg Oral Daily  . clopidogrel  75 mg Oral Daily  . isosorbide mononitrate  30 mg Oral Daily  . levothyroxine  50 mcg Oral Daily  . naphazoline-pheniramine  1 drop Both Eyes Daily  . pantoprazole  20 mg Oral BID  . rOPINIRole  2 mg Oral QHS  . sodium chloride flush  3 mL Intravenous Q12H   Continuous Infusions: . sodium chloride     PRN Meds: sodium chloride, acetaminophen **AND** diphenhydrAMINE, acetaminophen, ALPRAZolam, fluticasone, nitroGLYCERIN, ondansetron (ZOFRAN) IV, sodium chloride flush, traMADol   Vital Signs    Vitals:   02/05/21 0448 02/05/21 0456 02/05/21 0854 02/05/21 0855  BP: (!) 90/58   124/60  Pulse: 76  80   Resp: 19     Temp: 98 F (36.7 C)  98.1 F (36.7 C)   TempSrc: Oral  Oral   SpO2: 95%  100%   Weight:  56.4 kg    Height:        Intake/Output Summary (Last 24 hours) at 02/05/2021 1028 Last data filed at 02/05/2021 0300 Gross per 24 hour  Intake 240 ml  Output 300 ml  Net -60 ml   Last 3 Weights 02/05/2021 02/04/2021 02/04/2021  Weight (lbs) 124 lb 4.8 oz 126 lb 127 lb 10.3 oz  Weight (kg) 56.382 kg 57.153 kg 57.9 kg      Telemetry    NSR - Personally Reviewed  ECG    3/3 NSR with q waves and minimal ST elevations in inferior leads - Personally Reviewed  Physical Exam   GEN: Well nourished, well developed in no acute distress NECK: No JVD CARDIAC:  regular rhythm, normal S1 and S2, no rubs or gallops. No murmur. VASCULAR: Radial pulses 2+ bilaterally.  RESPIRATORY:  Clear to auscultation without rales, wheezing or rhonchi  ABDOMEN: Soft, non-tender, non-distended MUSCULOSKELETAL:  Moves all 4 limbs independently SKIN: Warm and dry, no edema NEUROLOGIC:  No focal neuro deficits noted. PSYCHIATRIC:  Normal affect   Labs    High Sensitivity Troponin:   Recent Labs  Lab 02/02/21 0905 02/02/21 1110  TROPONINIHS 4 5      Chemistry Recent Labs  Lab 02/02/21 0905 02/03/21 0204 02/04/21 0043  NA 133* 132* 132*  K 4.2 3.9 3.6  CL 97* 96* 100  CO2 26 24 23   GLUCOSE 117* 89 100*  BUN 20 16 10   CREATININE 0.92 0.80 0.87  CALCIUM 9.1 9.2 8.9  PROT  --  5.6*  --   ALBUMIN  --  3.5  --   AST  --  13*  --   ALT  --  11  --   ALKPHOS  --  30*  --   BILITOT  --  0.7  --   GFRNONAA >60 >60 >60  ANIONGAP 10 12 9      Hematology Recent Labs  Lab 02/02/21 0905 02/04/21 0043  WBC 5.9 6.7  RBC  3.75* 3.36*  HGB 11.9* 10.4*  HCT 35.9* 32.2*  MCV 95.7 95.8  MCH 31.7 31.0  MCHC 33.1 32.3  RDW 13.3 13.1  PLT 342 301    BNPNo results for input(s): BNP, PROBNP in the last 168 hours.   DDimer No results for input(s): DDIMER in the last 168 hours.   Radiology    CARDIAC CATHETERIZATION  Result Date: 2021-02-21  Prox LAD to Mid LAD lesion is 100% stenosed.  Mid Cx lesion is 100% stenosed.  1st Mrg lesion is 100% stenosed.  Prox RCA lesion is 40% stenosed.  Prox RCA to Mid RCA lesion is 70% stenosed.  RPDA lesion is 100% stenosed.  RPAV lesion is 90% stenosed.  Dist RCA lesion is 60% stenosed.  SVG and is normal in caliber.  The graft exhibits no disease.  SVG.  Insertion lesion is 60% stenosed.  LIMA and is normal in caliber.  The graft exhibits no disease.  Origin to Prox Graft lesion is 90% stenosed.  Mid Graft lesion is 95% stenosed.  Mid Graft to Dist Graft lesion is 40% stenosed.  Post intervention, there is  a 70% residual stenosis.  Balloon angioplasty was performed using a BALLOON SAPPHIRE 2.5X12.  Prox Graft lesion is 90% stenosed.  1.  Severe underlying three-vessel coronary artery disease with patent grafts including LIMA to LAD, SVG to OM1/OM 3, SVG to diagonal with 60% anastomosis stenosis and SVG to right PDA.  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 2.  Left ventricular angiography was not performed.  EF was normal by echo.  Normal LVEDP. 3.  Unsuccessful attempted angioplasty to SVG to right PDA due to inability to cross the stenosis beyond the aneurysmal segment.  There was loss of flow and acute closure with wire manipulation.  Multiple CTO wires were used but with no success.  The patient developed mild inferior ST elevation on the monitor with throat discomfort.  Symptoms improved with nitroglycerin drip. Recommendations: The patient will have an inferior infarct although the right PDA distribution is relatively small.  The native RCA is diffusely diseased and tortuous and likely not suitable for PCI. I elected to place the patient on nitroglycerin drip and will continue medical therapy for her coronary artery disease. The procedure was performed via the right common femoral artery after unsuccessful access via the left radial artery due to suspected radial loop.    Cardiac Studies   Left heart cath/grafts 2021/02/21  Prox LAD to Mid LAD lesion is 100% stenosed.  Mid Cx lesion is 100% stenosed.  1st Mrg lesion is 100% stenosed.  Prox RCA lesion is 40% stenosed.  Prox RCA to Mid RCA lesion is 70% stenosed.  RPDA lesion is 100% stenosed.  RPAV lesion is 90% stenosed.  Dist RCA lesion is 60% stenosed.  SVG and is normal in caliber.  The graft exhibits no disease.  SVG.  Insertion lesion is 60% stenosed.  LIMA and is normal in caliber.  The graft exhibits no  disease.  Origin to Prox Graft lesion is 90% stenosed.  Mid Graft lesion is 95% stenosed.  Mid Graft to Dist Graft lesion is 40% stenosed.  Post intervention, there is a 70% residual stenosis.  Balloon angioplasty was performed using a BALLOON SAPPHIRE 2.5X12.  Prox Graft lesion is 90% stenosed.   1.  Severe underlying three-vessel coronary artery disease with patent grafts including  LIMA to LAD, SVG to OM1/OM 3, SVG to diagonal with 60% anastomosis stenosis and SVG to right PDA.  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 2.  Left ventricular angiography was not performed.  EF was normal by echo.  Normal LVEDP. 3.  Unsuccessful attempted angioplasty to SVG to right PDA due to inability to cross the stenosis beyond the aneurysmal segment.  There was loss of flow and acute closure with wire manipulation.  Multiple CTO wires were used but with no success.  The patient developed mild inferior ST elevation on the monitor with throat discomfort.  Symptoms improved with nitroglycerin drip.  Recommendations: The patient will have an inferior infarct although the right PDA distribution is relatively small.  The native RCA is diffusely diseased and tortuous and likely not suitable for PCI. I elected to place the patient on nitroglycerin drip and will continue medical therapy for her coronary artery disease.  The procedure was performed via the right common femoral artery after unsuccessful access via the left radial artery due to suspected radial loop.  Coronary Diagrams   Diagnostic Dominance: Right    Intervention     Echo 02/03/21 1. Left ventricular ejection fraction, by estimation, is 65 to 70%. The  left ventricle has hyperdynamic function. The left ventricle has no  regional wall motion abnormalities. There is mild left ventricular  hypertrophy. Left ventricular  diastolic  parameters are consistent with Grade I diastolic dysfunction (impaired  relaxation).  2. Right ventricular systolic function is normal. The right ventricular  size is normal. There is normal pulmonary artery systolic pressure. The  estimated right ventricular systolic pressure is 67.1 mmHg.  3. The mitral valve is normal in structure. No evidence of mitral valve  regurgitation. No evidence of mitral stenosis.  4. The aortic valve is tricuspid. Aortic valve regurgitation is mild.  Mild aortic valve sclerosis is present, with no evidence of aortic valve  stenosis.  5. The inferior vena cava is normal in size with greater than 50%  respiratory variability, suggesting right atrial pressure of 3 mmHg.   ECHO: 07/05/2016 - Left ventricle: The cavity size was normal. Wall thickness was  normal. Systolic function was normal. The estimated ejection  fraction was in the range of 60% to 65%. LV mid-ventricle false  tendon. Wall motion was normal; there were no regional wall  motion abnormalities. Doppler parameters are consistent with  abnormal left ventricular relaxation (grade 1 diastolic  dysfunction). The E/e&' ratio is between 8-15, suggesting  indeterminate LV filling pressure.  - Aortic valve: Sclerosis without stenosis. There was mild  regurgitation.  - Mitral valve: Mildly thickened leaflets . There was mild  regurgitation.  - Left atrium: The atrium was normal in size.  - Inferior vena cava: The vessel was normal in size. The  respirophasic diameter changes were in the normal range (>= 50%),  consistent with normal central venous pressure.   Impressions:   - LVEF 60-65%, normal wall thickness, normal wall motion, diastolic  dysfunction, indeterminate LV filling pressure, aortic valve  sclerosis with mild AI, mild MR, normal LA size, normal IVC.   CATH:  07/05/2016 Pre-CABG  Mid RCA lesion, 90 %stenosed.  Ost RPDA to RPDA lesion, 70  %stenosed.  Ost 1st Mrg lesion, 80 %stenosed.  Ost Cx to Prox Cx lesion, 90 %stenosed.  Ost LM to LM lesion, 70 %stenosed.  Ost 2nd  Mrg to 2nd Mrg lesion, 70 %stenosed.  Ost LAD to Prox LAD lesion, 70 %stenosed.  Lat 2nd Mrg lesion, 99 %stenosed.  Mid LAD lesion, 50 %stenosed.  The left ventricular systolic function is normal.  LV end diastolic pressure is normal.  The left ventricular ejection fraction is greater than 65% by visual estimate.  1. Severe triple vessel CAD 2. Severe stenosis involving the left main artery, ostial Circumflex and ostial/proximal LAD. This is best seen in the caudal view.  3. Severe stenosis mid RCA, PDA and obtuse marginal branch of the Circumflex.  4. Normal LV systolic function.  5. 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 Her anatomy and disease is not favorable for PCI. I will ask CT surgery to see her to discuss CABG. She has had prior open chest surgery. 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 Will need to get records of this surgery.  Diagnostic Dominance: Right     MYOVIEW: 11/02/2020  Nuclear stress EF: 77%.  There was no ST segment deviation noted during stress.  No T wave inversion was noted during stress.  The study is normal.  This is a low risk study.  The left ventricular ejection fraction is hyperdynamic (>65%).  1. Normal study without ischemia or infarction.  2. Normal LVEF, >65%.  3. This is a low-risk study.   Patient Profile     80 y.o. female with PMH severe CAD with prior CABG, PAD, hyperlipidemia, hypertension who presented with chest pain and fatigue concerning for unstable angina. S/P cath and attempted PCI 3/2, now with complete closure of SVG to PDA.  Assessment & Plan    Chest pain concerning for unstable angina History of severe CAD s/p CABG 2017 S/P cath and attempted PCI 02/03/21 with complete closure of SVG to PDA  graft -Unfortunately there was a very tight stenosis in her SVG graft to PDA after an aneurysmal segment. Intervention was attempted on this, but there was complete graft closure with instrumentation. She is currently having a small inferior infarct with loss of flow to the area supplied by the SVG to PDA. -continue clopidogrel, aspirin 81 mg. Given the extent of disease, would continue indefinitely -we discussed angina and that her throat pain is consistent with this. Discussed signs/symptoms to watch for. Discussed when to use SL NG and getting a new prescription for this -we will try a low dose of imdur, as she did have relief on the nitro drip -echo with preserved EF -walked with cardiac rehab today  PAD Hyperlipidemia Statin intolerance, ezetimibe intolerance -was on repatha as an outpatient, having issues with PCSK9i coverage, working with Dr. Debara Pickett -has had PAD intervention by Dr. Fletcher Anon. Consider follow up testing as an outpatient -lipids severely abnormal. Labs 1 month ago show Tchol 356, TG 213, HDL 71, LDL 243. -reports that her entire family has heart disease. Highly suggesting of genetic CV/lipid disease. Dr. 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 -lipids prior to PCSK9i showed Tchol 414, TG 201, HDL 64, LDL 308.  Hypertension -on HCTZ, lisinopril as an outpatient -blood pressures lower in hospital than at home, holding HCTZ and lisinopril currently -started imdur as above  She is doing well, planning for discharge later today.  Cardiac discharge medications will be: Aspirin 81 mg daily Clopidogrel 75 mg daily Restart outpatient repatha (though has pending visit with Dr. Debara Pickett to discuss this) Start imdur 30 mg daily Hold lisinopril, HCTZ until seen  for outpatient follow up  We will get her outpatient follow up.   Buford Dresser, MD, PhD, [place]   For questions or updates, please contact [place] Please  consult [email or web address] for contact info under     Signed, Buford Dresser, MD  02/05/2021, 10:28 AM

## 2021-02-05 NOTE — Progress Notes (Signed)
D/C instructions given and reviewed. Tele and IV's removed, tolerated fair.

## 2021-02-05 NOTE — Discharge Summary (Signed)
Discharge Summary    Patient ID: Katherine Hall MRN: [id number]; DOB: 1941-06-02  Admit date: 02/02/2021 Discharge date: 02/05/2021  PCP:  Stacie Glaze, [facility]  Cardiologist:  Mertie Moores, MD  Advanced Practice Provider:  No care team member to display Electrophysiologist:  None    Discharge Diagnoses    Principal Problem:   Unstable angina [facility]) Active Problems:   Hypothyroidism   HTN (hypertension)   Hyperlipidemia   Chest pain   S/P CABG x 5   Pure hypercholesterolemia with target low density lipoprotein (LDL) cholesterol less than 70 mg/dL   Coronary artery disease involving native coronary artery of native heart without angina pectoris   PAD (peripheral artery disease) ([place])   Statin intolerance   Pre-diabetes    Diagnostic Studies/Procedures    Echo 02/03/2021 IMPRESSIONS    1. Left ventricular ejection fraction, by estimation, is 65 to 70%. The  left ventricle has hyperdynamic function. The left ventricle has no  regional wall motion abnormalities. There is mild left ventricular  hypertrophy. Left ventricular diastolic  parameters are consistent with Grade I diastolic dysfunction (impaired  relaxation).  2. Right ventricular systolic function is normal. The right ventricular  size is normal. There is normal pulmonary artery systolic pressure. The  estimated right ventricular systolic pressure is 06.2 mmHg.  3. The mitral valve is normal in structure. No evidence of mitral valve  regurgitation. No evidence of mitral stenosis.  4. The aortic valve is tricuspid. Aortic valve regurgitation is mild.  Mild aortic valve sclerosis is present, with no evidence of aortic valve  stenosis.  5. The inferior vena cava is normal in size with greater than 50%  respiratory variability, suggesting right atrial pressure of 3 mmHg.    Cath 02/03/2021  Prox LAD to Mid LAD lesion is 100% stenosed.  Mid Cx lesion is 100%  stenosed.  1st Mrg lesion is 100% stenosed.  Prox RCA lesion is 40% stenosed.  Prox RCA to Mid RCA lesion is 70% stenosed.  RPDA lesion is 100% stenosed.  RPAV lesion is 90% stenosed.  Dist RCA lesion is 60% stenosed.  SVG and is normal in caliber.  The graft exhibits no disease.  SVG.  Insertion lesion is 60% stenosed.  LIMA and is normal in caliber.  The graft exhibits no disease.  Origin to Prox Graft lesion is 90% stenosed.  Mid Graft lesion is 95% stenosed.  Mid Graft to Dist Graft lesion is 40% stenosed.  Post intervention, there is a 70% residual stenosis.  Balloon angioplasty was performed using a BALLOON SAPPHIRE 2.5X12.  Prox Graft lesion is 90% stenosed.   1.  Severe underlying three-vessel coronary artery disease with patent grafts including LIMA to LAD, SVG to OM1/OM 3, SVG to diagonal with 60% anastomosis stenosis and SVG to right PDA.  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 2.  Left ventricular angiography was not performed.  EF was normal by echo.  Normal LVEDP. 3.  Unsuccessful attempted angioplasty to SVG to right PDA due to inability to cross the stenosis beyond the aneurysmal segment.  There was loss of flow and acute closure with wire manipulation.  Multiple CTO wires were used but with no success.  The patient developed mild inferior ST elevation on the monitor with throat discomfort.  Symptoms improved with nitroglycerin drip.  Recommendations: The patient will  have an inferior infarct although the right PDA distribution is relatively small.  The native RCA is diffusely diseased and tortuous and likely not suitable for PCI. I elected to place the patient on nitroglycerin drip and will continue medical therapy for her coronary artery disease.  The procedure was performed via the right common femoral artery after unsuccessful access  via the left radial artery due to suspected radial loop.  Diagnostic Dominance: Right    Intervention     _____________   History of Present Illness     Katherine Hall is a 80 y.o. female with history of CABG 2017 w/ LIMA-LAD, SVG-Diag, SVG-OM1-OM2, SVG-PDA, PAD w/ L SFA PTA, HTN, HLD intol statins, OA, OHS for cardiac cyst, hypothyroid, anemia, L pleural effusion.   Katherine Hall saw Dr Acie Fredrickson 10/12/2020, she was having some atypical shoulder pain, ?anginal equivalent >> MV ordered. BP elevated, HCTZ increased to 25 mg qd and K+ added, f/u in 2-3 months.   She has appt's w/ Dr Debara Pickett and Kathleen Argue, [facility] scheduled this month.  She has been struggling since bypass surgery.  She feels that she never got back to her previous energy level.  She said she used to be on the go all the time and now cannot do but 1 thing a day.  In the last couple of weeks, she has developed right calf pain.  The pain started with ambulation.  The pain keeps recurring with ambulation.  She has stop after short distances to let the pain eased off before she can keep walking.  This is significantly limiting her.  Sometime in January, she got an upper respiratory infection that she thought was Covid.  She did a home Covid test that was negative.  Of note, the respiratory panel they did on admission was negative.  However, she states that she had cough, fatigue, congestion, multiple other symptoms.  It took several weeks for her to improve.  She finally feels better, but still has significant fatigue from that.  She began having chest pain that reminded her of her pre-CABG symptoms several weeks ago.  She describes it as twinges.  She states that they occur at rest and with exertion, are completely random.  She has them multiple times a week, but not necessarily every day.  This morning, the symptoms seemed more severe than usual and she decided it was just time to get something done about it.  That is why she went to  [facility].  In general, she did not take anything for the pain.  However, she took 2 sublingual nitroglycerin during one episode of pain and that did help.  She has not tried anything else.  Hospital Course     Consultants: N/A   Patient was admitted to cardiology service for evaluation of unstable angina.  Echocardiogram obtained on 02/03/2021 showed EF 65 to 70%, grade 1 DD, RVSP 19.5 mmHg, mild AI.  Subsequent cardiac catheterization performed on the same day revealed severe three-vessel native CAD with patent LIMA to LAD, patent SVG to OM1/OM 3, patent SVG to diagonal was 60% anastomosis stenosis, however SVG to right PDA had 90% stenosis proximally followed by another 90% stenosis leading into a aneurysmal segment followed by a 95% stenosis.  Angioplasty was attempted to SVG to right PDA, however unsuccessful due to inability to cross the stenosis beyond the aneurysmal segment, there was loss of flow and acute closure with wire manipulation.  Multiple CTO wire were used without success.  Patient developed mild acute inferior ST elevation on the heart monitor with throat discomfort.  Symptom improved with nitroglycerin drip.  Postprocedure, she had hypotension, outpatient blood pressure medication were held.  Given the extent of her coronary artery disease, it was recommended to continue aspirin and Plavix indefinitely.  She was placed on a low-dose Imdur.  Patient was seen in the morning of 02/05/2021, at which time she was doing well from cardiac perspective.  She is deemed stable for discharge from cardiology perspective.  Given claudication symptoms, patient will likely require outpatient follow-up with Dr. Fletcher Anon for further assessment.  Note, her home lisinopril and the hydrochlorothiazide are currently on hold until outpatient follow-up.  Did the patient have an acute coronary syndrome (MI, NSTEMI, STEMI, etc) this admission?:  No                               Did the patient have a  percutaneous coronary intervention (stent / angioplasty)?:  No.  Attempted, unsuccessful     _____________  Discharge Vitals Blood pressure 113/68, pulse 78, temperature 98 F (36.7 C), temperature source Oral, resp. rate 19, height 4\' 8"  (1.422 m), weight 56.4 kg, SpO2 98 %.  Filed Weights   02/04/21 0500 02/04/21 2000 02/05/21 0456  Weight: 57.9 kg 57.2 kg 56.4 kg    Labs & Radiologic Studies    CBC Recent Labs    02/04/21 0043  WBC 6.7  HGB 10.4*  HCT 32.2*  MCV 95.8  PLT 762   Basic Metabolic Panel Recent Labs    02/03/21 0204 02/04/21 0043  NA 132* 132*  K 3.9 3.6  CL 96* 100  CO2 24 23  GLUCOSE 89 100*  BUN 16 10  CREATININE 0.80 0.87  CALCIUM 9.2 8.9   Liver Function Tests Recent Labs    02/03/21 0204  AST 13*  ALT 11  ALKPHOS 30*  BILITOT 0.7  PROT 5.6*  ALBUMIN 3.5   No results for input(s): LIPASE, AMYLASE in the last 72 hours. High Sensitivity Troponin:   Recent Labs  Lab 02/02/21 0905 02/02/21 1110  TROPONINIHS 4 5    BNP Invalid input(s): POCBNP D-Dimer No results for input(s): DDIMER in the last 72 hours. Hemoglobin A1C Recent Labs    02/02/21 1806  HGBA1C 6.2*   Fasting Lipid Panel No results for input(s): CHOL, HDL, LDLCALC, TRIG, CHOLHDL, LDLDIRECT in the last 72 hours. Thyroid Function Tests Recent Labs    02/02/21 1806  TSH 0.960   _____________  CARDIAC CATHETERIZATION  Result Date: 02/03/2021  Prox LAD to Mid LAD lesion is 100% stenosed.  Mid Cx lesion is 100% stenosed.  1st Mrg lesion is 100% stenosed.  Prox RCA lesion is 40% stenosed.  Prox RCA to Mid RCA lesion is 70% stenosed.  RPDA lesion is 100% stenosed.  RPAV lesion is 90% stenosed.  Dist RCA lesion is 60% stenosed.  SVG and is normal in caliber.  The graft exhibits no disease.  SVG.  Insertion lesion is 60% stenosed.  LIMA and is normal in caliber.  The graft exhibits no disease.  Origin to Prox Graft lesion is 90% stenosed.  Mid Graft lesion  is 95% stenosed.  Mid Graft to Dist Graft lesion is 40% stenosed.  Post intervention, there is a 70% residual stenosis.  Balloon angioplasty was performed using a BALLOON SAPPHIRE 2.5X12.  Prox Graft lesion is 90% stenosed.  1.  Severe underlying three-vessel  coronary artery disease with patent grafts including LIMA to LAD, SVG to OM1/OM 3, SVG to diagonal with 60% anastomosis stenosis and SVG to right PDA.  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 2.  Left ventricular angiography was not performed.  EF was normal by echo.  Normal LVEDP. 3.  Unsuccessful attempted angioplasty to SVG to right PDA due to inability to cross the stenosis beyond the aneurysmal segment.  There was loss of flow and acute closure with wire manipulation.  Multiple CTO wires were used but with no success.  The patient developed mild inferior ST elevation on the monitor with throat discomfort.  Symptoms improved with nitroglycerin drip. Recommendations: The patient will have an inferior infarct although the right PDA distribution is relatively small.  The native RCA is diffusely diseased and tortuous and likely not suitable for PCI. I elected to place the patient on nitroglycerin drip and will continue medical therapy for her coronary artery disease. The procedure was performed via the right common femoral artery after unsuccessful access via the left radial artery due to suspected radial loop.   US Venous Img Lower Right (DVT Study)  Result Date: 02/02/2021 CLINICAL DATA:  Right calf pain. EXAM: RIGHT LOWER EXTREMITY VENOUS DOPPLER ULTRASOUND TECHNIQUE: Gray-scale sonography with graded compression, as well as color Doppler and duplex ultrasound were performed to evaluate the lower extremity deep venous systems from the level of the common femoral vein and including the common femoral, femoral, profunda femoral, popliteal  and calf veins including the posterior tibial, peroneal and gastrocnemius veins when visible. The superficial great saphenous vein was also interrogated. Spectral Doppler was utilized to evaluate flow at rest and with distal augmentation maneuvers in the common femoral, femoral and popliteal veins. COMPARISON:  None. FINDINGS: Contralateral Common Femoral Vein: Respiratory phasicity is normal and symmetric with the symptomatic side. No evidence of thrombus. Normal compressibility. Common Femoral Vein: No evidence of thrombus. Normal compressibility, respiratory phasicity and response to augmentation. Saphenofemoral Junction: No evidence of thrombus. Normal compressibility and flow on color Doppler imaging. Profunda Femoral Vein: No evidence of thrombus. Normal compressibility and flow on color Doppler imaging. Femoral Vein: No evidence of thrombus. Normal compressibility, respiratory phasicity and response to augmentation. Popliteal Vein: No evidence of thrombus. Normal compressibility, respiratory phasicity and response to augmentation. Calf Veins: No evidence of thrombus. Normal compressibility and flow on color Doppler imaging. Superficial Great Saphenous Vein: No evidence of thrombus. Normal compressibility. Venous Reflux:  None. Other Findings: No evidence of superficial thrombophlebitis or abnormal fluid collection. IMPRESSION: No evidence of right lower extremity deep venous thrombosis. Electronically Signed   By: Aletta Edouard M.D.   On: 02/02/2021 09:56   DG Chest Portable 1 View  Result Date: 02/02/2021 CLINICAL DATA:  Chest pain, shortness of breath EXAM: PORTABLE CHEST 1 VIEW COMPARISON:  09/24/2020 FINDINGS: Prior CABG. Heart and mediastinal contours are within normal limits. No focal opacities or effusions. No acute bony abnormality. IMPRESSION: No active disease. Electronically Signed   By: Rolm Baptise M.D.   On: 02/02/2021 09:18   ECHOCARDIOGRAM COMPLETE  Result Date: 02/03/2021     ECHOCARDIOGRAM REPORT   Patient Name:   Katherine Hall Date of Exam: 02/03/2021 Medical Rec #:  [id number]     Height:       58.0 in Accession #:    [id number]    Weight:  129.8 lb Date of Birth:  12/18/1940      BSA:          1.515 m Patient Age:    49 years      BP:           122/58 mmHg Patient Gender: F             HR:           74 bpm. Exam Location:  Inpatient Procedure: 2D Echo, Cardiac Doppler and Color Doppler Indications:    Chest Pain R07.9  History:        Patient has prior history of Echocardiogram examinations, most                 recent 07/07/2016. CAD; Risk Factors:Hypertension, Dyslipidemia                 and Non-Smoker.  Sonographer:    Vickie Epley RDCS Referring Phys: [address] [place]  1. Left ventricular ejection fraction, by estimation, is 65 to 70%. The left ventricle has hyperdynamic function. The left ventricle has no regional wall motion abnormalities. There is mild left ventricular hypertrophy. Left ventricular diastolic parameters are consistent with Grade I diastolic dysfunction (impaired relaxation).  2. Right ventricular systolic function is normal. The right ventricular size is normal. There is normal pulmonary artery systolic pressure. The estimated right ventricular systolic pressure is 92.4 mmHg.  3. The mitral valve is normal in structure. No evidence of mitral valve regurgitation. No evidence of mitral stenosis.  4. The aortic valve is tricuspid. Aortic valve regurgitation is mild. Mild aortic valve sclerosis is present, with no evidence of aortic valve stenosis.  5. The inferior vena cava is normal in size with greater than 50% respiratory variability, suggesting right atrial pressure of 3 mmHg. FINDINGS  Left Ventricle: Left ventricular ejection fraction, by estimation, is 65 to 70%. The left ventricle has hyperdynamic function. The left ventricle has no regional wall motion abnormalities. The left ventricular internal cavity size was normal in size. There is  mild left ventricular hypertrophy. Left ventricular diastolic parameters are consistent with Grade I diastolic dysfunction (impaired relaxation). Right Ventricle: The right ventricular size is normal. No increase in right ventricular wall thickness. Right ventricular systolic function is normal. There is normal pulmonary artery systolic pressure. The tricuspid regurgitant velocity is 2.03 m/s, and  with an assumed right atrial pressure of 3 mmHg, the estimated right ventricular systolic pressure is 26.8 mmHg. Left Atrium: Left atrial size was normal in size. Right Atrium: Right atrial size was normal in size. Pericardium: There is no evidence of pericardial effusion. Mitral Valve: The mitral valve is normal in structure. No evidence of mitral valve regurgitation. No evidence of mitral valve stenosis. Tricuspid Valve: The tricuspid valve is normal in structure. Tricuspid valve regurgitation is trivial. Aortic Valve: The aortic valve is tricuspid. Aortic valve regurgitation is mild. Aortic regurgitation PHT measures 518 msec. Mild aortic valve sclerosis is present, with no evidence of aortic valve stenosis. Pulmonic Valve: The pulmonic valve was normal in structure. Pulmonic valve regurgitation is not visualized. Aorta: The aortic root is normal in size and structure. Venous: The inferior vena cava is normal in size with greater than 50% respiratory variability, suggesting right atrial pressure of 3 mmHg. IAS/Shunts: No atrial level shunt detected by color flow Doppler.  LEFT VENTRICLE PLAX 2D LVIDd:         3.30 cm     Diastology LVIDs:  2.40 cm     LV e' medial:    3.81 cm/s LV PW:         0.80 cm     LV E/e' medial:  14.2 LV IVS:        0.80 cm     LV e' lateral:   7.78 cm/s LVOT diam:     2.00 cm     LV E/e' lateral: 6.9 LV SV:         63 LV SV Index:   42 LVOT Area:     3.14 cm  LV Volumes (MOD) LV vol d, MOD A2C: 47.4 ml LV vol d, MOD A4C: 53.6 ml LV vol s, MOD A2C: 21.7 ml LV vol s, MOD A4C: 19.4 ml LV  SV MOD A2C:     25.7 ml LV SV MOD A4C:     53.6 ml LV SV MOD BP:      29.6 ml RIGHT VENTRICLE RV S prime:     11.20 cm/s TAPSE (M-mode): 1.6 cm LEFT ATRIUM             Index       RIGHT ATRIUM           Index LA diam:        3.00 cm 1.98 cm/m  RA Area:     10.30 cm LA Vol (A2C):   24.9 ml 16.43 ml/m RA Volume:   19.10 ml  12.60 ml/m LA Vol (A4C):   23.7 ml 15.64 ml/m LA Biplane Vol: 25.2 ml 16.63 ml/m  AORTIC VALVE LVOT Vmax:   102.00 cm/s LVOT Vmean:  69.700 cm/s LVOT VTI:    0.202 m AI PHT:      518 msec  AORTA Ao Root diam: 2.60 cm MITRAL VALVE               TRICUSPID VALVE MV Area (PHT): 3.08 cm    TR Peak grad:   16.5 mmHg MV Decel Time: 246 msec    TR Vmax:        203.00 cm/s MV E velocity: 54.00 cm/s MV A velocity: 84.00 cm/s  SHUNTS MV E/A ratio:  0.64        Systemic VTI:  0.20 m                            Systemic Diam: 2.00 cm Loralie Champagne MD Electronically signed by Loralie Champagne MD Signature Date/Time: 02/03/2021/2:11:58 PM    Final    Disposition   Pt is being discharged home today in good condition.  Follow-up Plans & Appointments     Follow-up Information    Pixie Casino, MD Follow up on 02/15/2021.   Specialty: Cardiology Why: 2:15PM. Lipid clinic follow up Contact information: [place] [address] [phone]        Richardson Dopp T, PA-C Follow up on 02/23/2021.   Specialties: Cardiology, Physician Assistant Why: 10:15AM. Cardiology follow up Contact information: [address] [address] [phone]              Discharge Instructions    AMB Referral to Cardiac Rehabilitation - Phase II   Complete by: As directed    Diagnosis: NSTEMI   After initial evaluation and assessments completed: Virtual Based Care may be provided alone or in conjunction with Phase 2 Cardiac Rehab based on patient barriers.: Yes   Diet - low sodium  heart healthy   Complete by: As directed    Discharge instructions    Complete by: As directed    No lifting over 5 lbs for 1 week. No sexual activity for 1 week. Keep procedure site clean & dry. If you notice increased pain, swelling, bleeding or pus, call/return!  You may shower, but no soaking baths/hot tubs/pools for 1 week.   Increase activity slowly   Complete by: As directed       Discharge Medications   Allergies as of 02/05/2021      Reactions   Penicillins Anaphylaxis   Did it involve swelling of the face/tongue/throat, SOB, or low BP? Yes Did it involve sudden or severe rash/hives, skin peeling, or any reaction on the inside of your mouth or nose? No Did you need to seek medical attention at a hospital or doctor's office? Yes When did it last happen?10+ years If all above answers are "NO", may proceed with cephalosporin use.   Lincomycin Swelling, Other (See Comments)   passed out   Naproxen Sodium Other (See Comments)   Stomach ulcers   Statins Other (See Comments)   Myalgias   Latex Other (See Comments)   Blisters when in contact with skin   Tape    blistering      Medication List    STOP taking these medications   hydrochlorothiazide 25 MG tablet Commonly known as: HYDRODIURIL   lisinopril 20 MG tablet Commonly known as: ZESTRIL   potassium chloride 10 MEQ tablet Commonly known as: KLOR-CON     TAKE these medications   acetaminophen 650 MG CR tablet Commonly known as: TYLENOL Take 1,300 mg by mouth in the morning and at bedtime.   acyclovir 400 MG tablet Commonly known as: ZOVIRAX Take 800 mg by mouth daily as needed (fever blisters).   alendronate 70 MG tablet Commonly known as: FOSAMAX Take 70 mg by mouth every Saturday.   aspirin 81 MG chewable tablet Chew 1 tablet (81 mg total) by mouth daily. Start taking on: February 06, 2021   clopidogrel 75 MG tablet Commonly known as: Plavix Take 1 tablet (75 mg total) by mouth daily.   diphenhydramine-acetaminophen 25-500 MG Tabs tablet Commonly known as: TYLENOL  PM Take 2 tablets by mouth at bedtime as needed (sleep).   fluticasone 50 MCG/ACT nasal spray Commonly known as: FLONASE Place 2 sprays into both nostrils daily as needed for allergies.   gatifloxacin 0.5 % Soln Commonly known as: ZYMAXID Place 1 drop into the left eye 2 (two) times daily.   isosorbide mononitrate 30 MG 24 hr tablet Commonly known as: IMDUR Take 1 tablet (30 mg total) by mouth daily. Start taking on: February 06, 2021   levothyroxine 50 MCG tablet Commonly known as: SYNTHROID Take 50 mcg by mouth daily.   nitroGLYCERIN 0.4 MG SL tablet Commonly known as: NITROSTAT Place 1 tablet (0.4 mg total) under the tongue every 5 (five) minutes x 3 doses as needed for chest pain.   OPCON-A OP Place 1 drop into both eyes daily.   pantoprazole 20 MG tablet Commonly known as: PROTONIX Take 20 mg by mouth 2 (two) times daily.   Repatha Pushtronex System 420 MG/3.5ML Soct Generic drug: Evolocumab with Infusor Inject 1 Dose into the skin every 30 (thirty) days.   rOPINIRole 2 MG tablet Commonly known as: REQUIP Take 2 mg by mouth at bedtime.   traMADol 50 MG tablet Commonly known as: ULTRAM Take 1 tablet (50 mg total) by mouth  every 6 (six) hours as needed for severe pain.          Outstanding Labs/Studies   Consider outpatient ABI to evaluation claudication symptom  Duration of Discharge Encounter   Greater than 30 minutes including physician time.  Hilbert Corrigan, PA 02/05/2021, 1:14 PM

## 2021-02-11 ENCOUNTER — Telehealth: Payer: Self-pay | Admitting: *Deleted

## 2021-02-11 NOTE — Telephone Encounter (Signed)
Pt may hold her ASA and Plavix for 5-7 days for her extensive dental procedure She is at low risk for the procedure     Mertie Moores, MD  02/11/2021 5:35 PM    [facility] [place],  [place], [place]  [address] Phone: [phone]; Fax: [phone]

## 2021-02-11 NOTE — Telephone Encounter (Signed)
Katherine Hall 80 year old female is requesting clearance for 8 teeth to be removed.  She was discharged from the hospital on 02/05/2021.  She underwent cardiac catheterization on 02/03/2021.  An unsuccessful attempt at angioplasty to her SVG-right PDA was attempted.  She was noted to have multiple CTO however wires were not able to cross occlusions.  Indefinite aspirin and Plavix therapy were recommended.  She was also placed on low-dose Imdur.  Follow-up plan for 02/23/2021.  Her PMH includes CABG 2017 LIMA-LAD, SVG-Diag, SVG-OM1-OM2, SVG-PDA, PAD w/ L SFA PTA, HTN, HLD intol statins, OA, OHS for cardiac cyst, hypothyroid, anemia, L pleural effusion.   When may we hold her aspirin/Plavix?  Thank you for your help.  Please direct response to CV DIV preop pool  [place]. Marcio Hoque NP-C    02/11/2021, 4:01 PM [facility] [place] [address] Office [phone] Fax [phone]

## 2021-02-11 NOTE — Telephone Encounter (Signed)
   [facility] Pre-operative Risk Assessment    HEARTCARE STAFF: - Please ensure there is not already an duplicate clearance open for this procedure. - Under Visit Info/Reason for Call, type in Other and utilize the format Clearance MM/DD/YY or Clearance TBD. Do not use dashes or single digits. - If request is for dental extraction, please clarify the # of teeth to be extracted.  Request for surgical clearance:  1. What type of surgery is being performed? 8 TEETH TO BE EXTRACTED ALONG WITH SCALING AND ROOT PLANING    2. When is this surgery scheduled? TBD  3. What type of clearance is required (medical clearance vs. Pharmacy clearance to hold med vs. Both)? MEDICAL  4. Are there any medications that need to be held prior to surgery and how long? ASA AND PLAVIX   5. Practice name and name of physician performing surgery? [facility]; DR. Lyda Kalata, DDS   6. What is the office phone number? [phone]   7.   What is the office fax number? [phone]  8.   Anesthesia type (None, local, MAC, general) ? IV CONSCIOUS SEDATION   Julaine Hua 02/11/2021, 3:40 PM  _________________________________________________________________   (provider comments below)

## 2021-02-12 NOTE — Telephone Encounter (Signed)
   Primary Cardiologist: Mertie Moores, MD  Chart reviewed as part of pre-operative protocol coverage. Given past medical history and time since last visit, based on ACC/AHA guidelines, Katherine Hall would be at acceptable risk for the planned procedure without further cardiovascular testing.   Her aspirin and Plavix may be held for 5-7 days prior to her surgery.  Please resume as soon as hemostasis is achieved at the discretion of the surgeon.  I will route this recommendation to the requesting party via Epic fax function and remove from pre-op pool.  Please call with questions.  Jossie Ng. Bryley Chrisman NP-C    02/12/2021, 6:59 AM [place] [address] Office [phone] Fax [phone]

## 2021-02-15 ENCOUNTER — Ambulatory Visit: Payer: Medicare Other | Admitting: Internal Medicine

## 2021-02-15 ENCOUNTER — Other Ambulatory Visit: Payer: Self-pay

## 2021-02-15 ENCOUNTER — Encounter: Payer: Self-pay | Admitting: Internal Medicine

## 2021-02-15 VITALS — BP 123/81 | HR 86 | Ht <= 58 in | Wt 125.6 lb

## 2021-02-15 DIAGNOSIS — T466X5D Adverse effect of antihyperlipidemic and antiarteriosclerotic drugs, subsequent encounter: Secondary | ICD-10-CM

## 2021-02-15 DIAGNOSIS — I2510 Atherosclerotic heart disease of native coronary artery without angina pectoris: Secondary | ICD-10-CM | POA: Diagnosis not present

## 2021-02-15 DIAGNOSIS — I739 Peripheral vascular disease, unspecified: Secondary | ICD-10-CM | POA: Diagnosis not present

## 2021-02-15 DIAGNOSIS — M791 Myalgia, unspecified site: Secondary | ICD-10-CM

## 2021-02-15 DIAGNOSIS — Z006 Encounter for examination for normal comparison and control in clinical research program: Secondary | ICD-10-CM

## 2021-02-15 DIAGNOSIS — E7801 Familial hypercholesterolemia: Secondary | ICD-10-CM

## 2021-02-15 DIAGNOSIS — T466X5A Adverse effect of antihyperlipidemic and antiarteriosclerotic drugs, initial encounter: Secondary | ICD-10-CM

## 2021-02-15 NOTE — Patient Instructions (Addendum)
Medication Instructions:   Your Leqvio injection is scheduled on ______ at ________. Please arrive at [facility] and enter the hospital through [facility] [employer] that's located at [employer] 15 minutes before your scheduled injection time. Walk in to the registration desk and let them know that you are scheduled for an injection in the infusion center. They will register you and take you to your appointment.   Marion Downer is a subcutaneous injection that will lower your LDL cholesterol by 50%. If this is your first injection, your next injection will be due in 3 months. If this is NOT your first injection, your next injection will be due in 6 months. If you have not heard from HeartCare to schedule your next appointment within 2 weeks of your next anticipated injection, please call HeartCare to schedule this at:                [phone] - if you are seen at the [facility] office               [phone] - if you are seen at the [facility] office  -We will also follow up with you about the Nexletol clinical trial.   *If you need a refill on your cardiac medications before your next appointment, please call your pharmacy*   Follow-Up: At [facility], you and your health needs are our priority.  As part of our continuing mission to provide you with exceptional heart care, we have created designated Provider Care Teams.  These Care Teams include your primary Cardiologist (physician) and Advanced Practice Providers (APPs -  Physician Assistants and Nurse Practitioners) who all work together to provide you with the care you need, when you need it.  We recommend signing up for the patient portal called "MyChart".  Sign up information is provided on this After Visit Summary.  MyChart is used to connect with patients for Virtual Visits (Telemedicine).  Patients are able to view lab/test results, encounter notes, upcoming appointments, etc.   Non-urgent messages can be sent to your provider as well.   To learn more about what you can do with MyChart, go to [email or web address].    Your next appointment:   3 month(s)  The format for your next appointment:   In Person  Provider:   K. Mali Hilty, MD   Other Instructions In the [facility].

## 2021-02-15 NOTE — Progress Notes (Signed)
LIPID CLINIC CONSULT NOTE  Chief Complaint:  Follow-up dyslipidemia  Primary Care Physician: Stacie Glaze, DO  Primary Cardiologist:  Mertie Moores, MD  HPI:  Katherine Hall is a 80 y.o. female who is being seen today for the evaluation of dyslipidemia at the request of Stacie Glaze, DO. This is a 80 year old female patient of Dr. Acie Hall with significant dyslipidemia that has been challenging to manage.  Unfortunately she also has significant cardiovascular disease.  Known coronary artery disease and is status post CABG x5 in 2017 peripheral arterial disease status post left SFA angioplasty for severe claudication.  Other comorbidities include hypertension and familial hyperlipidemia.  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  Urgently she had significant statin intolerance causing myalgias to the point where she could not get out of bed.  She is tried numerous statins including both high potency statins with similar results.  Subsequently she has been on ezetimibe which she said also caused her side effects.  She was then placed on Repatha 140 mg every 2 weeks.  She has been using that as prescribed in her thigh, rotating the injection site for months.  Her last lipid profile 2 months ago showed total cholesterol 338, triglycerides 121, HDL 73 and an LDL of 244 which is improved somewhat from 5 months ago at which time her total cholesterol was 414, triglycerides 201, HDL 64 and LDL of 308.  The lowest lipid profile seen was a total cholesterol 220 with an LDL of 151 however this may have been in combination with statin therapy which he ultimately could not tolerate.  More recently apparently she was referred for the CLEAR trial, however it was noted that she is no longer taking the study drug.  It was not clear whether she was on therapy or placebo.  This suggest that there might be an option for her to take bempedoic  acid now that it is commercially available.  There also is a significant family history of coronary disease including coronary disease in her father who died of an MI in his 66s, one sister who died of a massive MI in her 5s and another sister who apparently has 48 stents.  She has 2 brothers 1 who had an MI in his 63s and died at 16 and another who has had multivessel bypass surgery.  02/15/2021  [name] returns today for follow-up.  Due to incomplete response to Repatha, she felt like she ultimately would have to come off of therapy.  She was switched to once monthly Repatha Pushtronex which she said she took 3 doses but has been off of for several months.  She had repeat lipids which essentially are untreated.  This demonstrated total cholesterol of 356, triglycerides 213, HDL 71 and LDL-C of 243.  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  As mentioned there is a very strong family history of heart disease.  She was enrolled in the clear trial however I think she has completed the study.  We discussed some potential additional options including inclisiran, which was recently FDA approved and/or adding bempedoic acid.  Unfortunately she has active coronary disease with recent angina presented to the Cath Lab and found to have an occlusion to the right coronary artery which could not be intervened upon.  PMHx:  Past Medical History:  Diagnosis Date  . Anemia   .  Arthritis   . Coronary artery disease   . Gastric ulcer    a. h/o bleeding ulcer 2009 requiring 3 pints of blood.  Katherine Hall History of removal of cyst    a. 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  Katherine Hall HLD (hyperlipidemia)    a. h/o intolerance of statins, prev on Repatha but insurance stopped covering.  Katherine Hall HTN (hypertension)   . Thyroid disease     Past Surgical History:  Procedure Laterality Date  . ABDOMINAL AORTOGRAM W/LOWER EXTREMITY Bilateral 11/06/2019    Procedure: ABDOMINAL AORTOGRAM W/LOWER EXTREMITY;  Surgeon: Wellington Hampshire, MD;  Location: [place] [facility];  Service: Cardiovascular;  Laterality: Bilateral;  . ABDOMINAL HYSTERECTOMY    . APPENDECTOMY    . CARDIAC CATHETERIZATION N/A 07/05/2016   Procedure: Left Heart Cath and Coronary Angiography;  Surgeon: Burnell Blanks, MD;  Location: [place] [facility];  Service: Cardiovascular;  Laterality: N/A;  . CARDIAC SURGERY     cyst, not on heart.  . CORONARY ARTERY BYPASS GRAFT N/A 07/07/2016   Procedure: CORONARY ARTERY BYPASS GRAFTING (CABG) x 5 with endoscopic harvesting of the right greater saphenous vein;  Surgeon: Gaye Pollack, MD;  Location: [facility];  Service: Open Heart Surgery;  Laterality: N/A;  . CORONARY BALLOON ANGIOPLASTY N/A 02/03/2021   Procedure: CORONARY BALLOON ANGIOPLASTY;  Surgeon: Wellington Hampshire, MD;  Location: [place] [facility];  Service: Cardiovascular;  Laterality: N/A;  svg to pda  . HEMORRHOID SURGERY    . INTRAOPERATIVE TRANSESOPHAGEAL ECHOCARDIOGRAM N/A 07/07/2016   Procedure: INTRAOPERATIVE TRANSESOPHAGEAL ECHOCARDIOGRAM;  Surgeon: Gaye Pollack, MD;  Location: [facility];  Service: Open Heart Surgery;  Laterality: N/A;  . LEFT HEART CATH AND CORS/GRAFTS ANGIOGRAPHY N/A 02/03/2021   Procedure: LEFT HEART CATH AND CORS/GRAFTS ANGIOGRAPHY;  Surgeon: Wellington Hampshire, MD;  Location: [place] [facility];  Service: Cardiovascular;  Laterality: N/A;  . PERIPHERAL VASCULAR BALLOON ANGIOPLASTY  11/06/2019   Procedure: PERIPHERAL VASCULAR BALLOON ANGIOPLASTY;  Surgeon: Wellington Hampshire, MD;  Location: [place] [facility];  Service: Cardiovascular;;  cutting and dcb  . SHOULDER SURGERY      FAMHx:  Family History  Problem Relation Age of Onset  . Stroke Mother        Stroke age 40  . CAD Father        died of MI age 10  . Coronary artery disease Sister        one sister had massive MI at 55, another sister has 56 stents  . Coronary artery disease Brother        one  brother had MI at age 52 and died at 59, another has had 4 MIs and 2 bypasses    SOCHx:   reports that she has never smoked. She has never used smokeless tobacco. She reports that she does not drink alcohol and does not use drugs.  ALLERGIES:  Allergies  Allergen Reactions  . Penicillins Anaphylaxis    Did it involve swelling of the face/tongue/throat, SOB, or low BP? Yes Did it involve sudden or severe rash/hives, skin peeling, or any reaction on the inside of your mouth or nose? No Did you need to seek medical attention at a hospital or doctor's office? Yes When did it last happen?10+ years If all above answers are "NO", may proceed with cephalosporin use.    . Lincomycin Swelling and Other (See Comments)    passed out  . Naproxen Sodium  Other (See Comments)    Stomach ulcers  . Statins Other (See Comments)    Myalgias  . Latex Other (See Comments)    Blisters when in contact with skin  . Tape     blistering    ROS: Pertinent items noted in HPI and remainder of comprehensive ROS otherwise negative.  HOME MEDS: Current Outpatient Medications on File Prior to Visit  Medication Sig Dispense Refill  . acetaminophen (TYLENOL) 650 MG CR tablet Take 1,300 mg by mouth in the morning and at bedtime.    Katherine Hall acyclovir (ZOVIRAX) 400 MG tablet Take 800 mg by mouth daily as needed (fever blisters).     Katherine Hall alendronate (FOSAMAX) 70 MG tablet Take 70 mg by mouth every Saturday.     Katherine Hall aspirin 81 MG chewable tablet Chew 1 tablet (81 mg total) by mouth daily.    . clopidogrel (PLAVIX) 75 MG tablet Take 1 tablet (75 mg total) by mouth daily. 90 tablet 3  . diphenhydramine-acetaminophen (TYLENOL PM) 25-500 MG TABS tablet Take 2 tablets by mouth at bedtime as needed (sleep).    . Evolocumab with Infusor ([place]) 420 MG/3.5ML SOCT Inject 1 Dose into the skin every 30 (thirty) days. 3.6 mL 11  . fluticasone (FLONASE) 50 MCG/ACT nasal spray Place 2 sprays into both nostrils  daily as needed for allergies.     Katherine Hall gatifloxacin (ZYMAXID) 0.5 % SOLN Place 1 drop into the left eye 2 (two) times daily.    . isosorbide mononitrate (IMDUR) 30 MG 24 hr tablet Take 1 tablet (30 mg total) by mouth daily. 90 tablet 3  . levothyroxine (SYNTHROID, LEVOTHROID) 50 MCG tablet Take 50 mcg by mouth daily.    . Naphazoline-Pheniramine (OPCON-A OP) Place 1 drop into both eyes daily.    . nitroGLYCERIN (NITROSTAT) 0.4 MG SL tablet Place 1 tablet (0.4 mg total) under the tongue every 5 (five) minutes x 3 doses as needed for chest pain. 25 tablet 1  . pantoprazole (PROTONIX) 20 MG tablet Take 20 mg by mouth 2 (two) times daily.    Katherine Hall rOPINIRole (REQUIP) 2 MG tablet Take 2 mg by mouth at bedtime.    . traMADol (ULTRAM) 50 MG tablet Take 1 tablet (50 mg total) by mouth every 6 (six) hours as needed for severe pain. 28 tablet 0   No current facility-administered medications on file prior to visit.    LABS/IMAGING: No results found for this or any previous visit (from the past 48 hour(s)). No results found.  LIPID PANEL:    Component Value Date/Time   CHOL 356 (H) 12/10/2020 1007   TRIG 213 (H) 12/10/2020 1007   HDL 71 12/10/2020 1007   CHOLHDL 5.0 (H) 12/10/2020 1007   CHOLHDL 4.6 11/08/2019 0404   VLDL 21 11/08/2019 0404   LDLCALC 243 (H) 12/10/2020 1007    WEIGHTS: Wt Readings from Last 3 Encounters:  02/15/21 125 lb 9.6 oz (57 kg)  02/05/21 124 lb 4.8 oz (56.4 kg)  11/02/20 125 lb (56.7 kg)    VITALS: BP 123/81   Pulse 86   Ht 4\' 8"  (1.422 m)   Wt 125 lb 9.6 oz (57 kg)   SpO2 98%   BMI 28.16 kg/m   EXAM: General appearance: alert and no distress Neck: no carotid bruit, no JVD and thyroid not enlarged, symmetric, no tenderness/mass/nodules Lungs: clear to auscultation bilaterally Heart: regular rate and rhythm, S1, S2 normal, no murmur, click, rub or gallop Abdomen: soft, non-tender; bowel  sounds normal; no masses,  no organomegaly Extremities: extremities  normal, atraumatic, no cyanosis or edema Pulses: 2+ and symmetric Skin: Skin color, texture, turgor normal. No rashes or lesions Neurologic: Grossly normal Psych: Plan  EKG: Deferred  ASSESSMENT: 1. 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 Strong family history of premature coronary disease 3. 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 Statin and ezetimibe intolerance - myalgias  PLAN: 1.   Mrs. 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  We again discussed genetic testing however she felt that it would add little and would be cost prohibitive.  Given her recurrent angina and progressive coronary artery disease, she needs aggressive lipid-lowering.  Since she did not have a desired response on Repatha, will consider inclisiran as an alternative.  Hopefully this will have good coverage due to high expected cost with Medicare.  Also I will reach out to our research nurses to see if she is still active with the CLEAR trial.  If so, she could probably crossover to drug therapy at no cost or we could offer her bempedoic acid which is now commercially available.  Plan repeat lipids and an LP(a) in 3 months and follow-up at that time.  Pixie Casino, MD, [facility], [place] Director of the Advanced Lipid Disorders &  Cardiovascular Risk Reduction Clinic Diplomate of the American Board of Clinical Lipidology Attending Cardiologist  Direct Dial: [phone]  Fax: [phone]  Website:  [email or web address] 02/15/2021, 2:56 PM

## 2021-02-22 ENCOUNTER — Telehealth: Payer: Self-pay

## 2021-02-22 DIAGNOSIS — Z006 Encounter for examination for normal comparison and control in clinical research program: Secondary | ICD-10-CM

## 2021-02-22 NOTE — Progress Notes (Addendum)
Cardiology Office Note:    Date:  02/23/2021   ID:  Katherine Hall, DOB 11-22-1941, MRN [id number]  PCP:  Katherine Glaze, DO   [facility]  Cardiologist:  Katherine Moores, MD   Advanced Practice Provider:  No care team member to display Electrophysiologist:  None       Referring MD: Katherine Glaze, DO   Chief Complaint:  Hospitalization Follow-up (Unstable angina>>unsuccessful PCI c/b graft closure )    Patient Profile:    Katherine Hall is a 80 y.o. female with:   Coronary artery disease   S/p CABG inn 07/2016  [place] >> cath 3/22: S-RPDA w severe disease - PCI unsuccessful & c/b peri-procedure MI  acute closure w wire manipulation>>inf ST elevation>>Med Rx  Peripheral arterial disease   S/p L SFA PTA   Hypertension   Familial Hypercholesterolemia  Intol of statins   Anemia   Hx of GI bleed (gastric ulcer)  Hypothyroidism   Pre-diabetes   Prior CV studies: Cardiac catheterization 2021/02/14 LAD prox to mid 100 LCx mid 100; OM1 100 RCA prox 40, prox-mid 70; dist 60; RPAV 90 S-OM1/OM3 patent S-D1 110 L-LAD patent S-RPDA ost-prox 90, mid 95, mid-dist 40 Unsuccessful PCI to S-RPDA   1.  Severe underlying three-vessel coronary artery disease with patent grafts including LIMA to LAD, SVG to OM1/OM 3, SVG to diagonal with 60% anastomosis stenosis and SVG to right PDA.  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 2.  Left ventricular angiography was not performed.  EF was normal by echo.  Normal LVEDP. 3.  Unsuccessful attempted angioplasty to SVG to right PDA due to inability to cross the stenosis beyond the aneurysmal segment.  There was loss of flow and acute closure with wire manipulation.  Multiple CTO wires were used but with no success.  The patient developed mild inferior ST elevation on the monitor with throat discomfort.   Symptoms improved with nitroglycerin drip.  Echocardiogram 02-14-21 EF 65-70, no RWMA, mild LVH, Gr 1 DD, normal RVSF, RVSP 19.5, mild AI, mild AV sclerosis  Myoview 11/02/20 No ischemia or infarction, EF > 65, low risk   History of Present Illness:    Katherine Hall last saw Dr. Acie Hall in 11/21 and was schedule for 3 mos f/u.  She was recently admitted 3/1-3/4 with unstable angina.  Cardiac catheterization demonstrated severe disease in the S-RPDA.  PCI was unsuccessful and complicated by acute closure with wire manipulation resulting in inferior ST elevation treated with IV NTG.  She developed hypotension post procedure and her usual BP meds were held.  She was placed on nitrates and recommendation was to continue DAPT indefinitely.  Of note, she reported R calf claudication.  She will need f/u with Dr. Fletcher Hall.    She returns for f/u.  She is here alone.  Since discharge from the hospital, she has continued to have episodes of chest discomfort.  She has taken nitroglycerin twice with relief.  She has shortness of breath with minimal activity.  She has slept in a recliner for years.  She has not had PND, significant leg edema.  Her right calf claudication is particularly bothersome for her.  It seems to be getting worse.  She gets it with minimal activity. Her discomfort does go away with rest.    Past Medical History:  Diagnosis Date  . Anemia   .  Arthritis   . Coronary artery disease    s/p CABG in 2017 // Myoview 11/21: No ischemia or infarction, EF > 65, low risk  // [place] >> cath 3/22: S-RPDA w severe disease - PCI unsuccessful & c/b peri-procedure MI (acute closure w wire manipulation>>inf ST elev) >> Med Rx   . Echocardiogram 02/2021    EF 65-70, no RWMA, mild LVH, Gr 1 DD, normal RVSF, RVSP 19.5, mild AI, mild AV sclerosis  . Familial hypercholesterolemia    a. h/o intolerance of statins, prev on Repatha but insurance stopped covering.  . Gastric ulcer    a. h/o bleeding ulcer 2009 requiring  3 pints of blood.  [name] History of removal of cyst    a. 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  [name] HTN (hypertension)   . Hypothyroidism   . PAD (peripheral artery disease) (HCC)    S/p L SFA PTA   . Prediabetes     Current Medications: Current Meds  Medication Sig  . acetaminophen (TYLENOL) 650 MG CR tablet Take 1,300 mg by mouth in the morning and at bedtime.  [name] acyclovir (ZOVIRAX) 400 MG tablet Take 800 mg by mouth daily as needed (fever blisters).   [name] alendronate (FOSAMAX) 70 MG tablet Take 70 mg by mouth every Saturday.   [name] aspirin 81 MG chewable tablet Chew 1 tablet (81 mg total) by mouth daily.  . clopidogrel (PLAVIX) 75 MG tablet Take 1 tablet (75 mg total) by mouth daily.  . diphenhydramine-acetaminophen (TYLENOL PM) 25-500 MG TABS tablet Take 2 tablets by mouth at bedtime as needed (sleep).  . fluticasone (FLONASE) 50 MCG/ACT nasal spray Place 2 sprays into both nostrils daily as needed for allergies.   [name] gatifloxacin (ZYMAXID) 0.5 % SOLN Place 1 drop into the left eye 2 (two) times daily.  . isosorbide mononitrate (IMDUR) 30 MG 24 hr tablet Take 1 tablet (30 mg total) by mouth daily.  [name] levothyroxine (SYNTHROID, LEVOTHROID) 50 MCG tablet Take 50 mcg by mouth daily.  . metoprolol tartrate (LOPRESSOR) 25 MG tablet Take 0.5 tablets (12.5 mg total) by mouth 2 (two) times daily.  . nitroGLYCERIN (NITROSTAT) 0.4 MG SL tablet Place 1 tablet (0.4 mg total) under the tongue every 5 (five) minutes x 3 doses as needed for chest pain.  . pantoprazole (PROTONIX) 20 MG tablet Take 20 mg by mouth 2 (two) times daily.  [name] rOPINIRole (REQUIP) 2 MG tablet Take 2 mg by mouth at bedtime.  . traMADol (ULTRAM) 50 MG tablet Take 1 tablet (50 mg total) by mouth every 6 (six) hours as needed for severe pain.     Allergies:   Penicillins, Lincomycin, Naproxen sodium, Statins, Latex, and Tape   Social History   Tobacco Use  . Smoking status: Never Smoker  . Smokeless  tobacco: Never Used  Vaping Use  . Vaping Use: Never used  Substance Use Topics  . Alcohol use: No    Comment: social  . Drug use: No     Family Hx: The patient's family history includes CAD in her father; Coronary artery disease in her brother and sister; Stroke in her mother.  ROS   EKGs/Labs/Other Test Reviewed:    EKG:  EKG is   ordered today.  The ekg ordered today demonstrates Normal sinus rhythm, heart rate 93, normal axis, inferior Q waves, nonspecific ST-T wave changes, QTC 452    Recent Labs: 02/02/2021: TSH 0.960 02/03/2021: ALT 11 02/04/2021: BUN 10;  Creatinine, Ser 0.87; Hemoglobin 10.4; Platelets 301; Potassium 3.6; Sodium 132   Recent Lipid Panel Lab Results  Component Value Date/Time   CHOL 356 (H) 12/10/2020 10:07 AM   TRIG 213 (H) 12/10/2020 10:07 AM   HDL 71 12/10/2020 10:07 AM   CHOLHDL 5.0 (H) 12/10/2020 10:07 AM   CHOLHDL 4.6 11/08/2019 04:04 AM   LDLCALC 243 (H) 12/10/2020 10:07 AM      Risk Assessment/Calculations:      Physical Exam:    VS:  BP 130/70   Pulse 93   Ht 4\' 8"  (1.422 m)   Wt 122 lb 9.6 oz (55.6 kg)   SpO2 97%   BMI 27.49 kg/m     Wt Readings from Last 3 Encounters:  02/23/21 122 lb 9.6 oz (55.6 kg)  02/15/21 125 lb 9.6 oz (57 kg)  02/05/21 124 lb 4.8 oz (56.4 kg)     Constitutional:      Appearance: Healthy appearance. Not in distress.  Neck:     Vascular: JVD normal.  Pulmonary:     Effort: Pulmonary effort is normal.     Breath sounds: No wheezing. No rales.  Cardiovascular:     Normal rate. Regular rhythm. Normal S1. Normal S2.     Murmurs: There is no murmur.     Comments: R FA site without hematoma; +R FA bruit Edema:    Peripheral edema absent.  Abdominal:     Palpations: Abdomen is soft. There is no hepatomegaly.  Skin:    General: Skin is warm and dry.  Neurological:     Mental Status: Alert and oriented to person, place and time.     Cranial Nerves: Cranial nerves are intact.       ASSESSMENT &  PLAN:    1. Coronary artery disease involving coronary bypass graft of native heart with angina pectoris ([place]) S/p CABG in 2017.  Most recent cardiac catheterization demonstrated severe disease in the SVG-RPDA.  PCI was unsuccessful and she had acute closure of the graft.  Her other grafts are patent.  She does have 60% stenosis in the vein graft to the diagonal.  She has been managed medically since discharge from the hospital.  She continues to have anginal symptoms.  Her electrocardiogram does not demonstrate significant changes.  She was taken off of beta-blocker therapy years ago.  It seems like her heart rate was low at that time and she was complaining of fatigue.  Her blood pressure and heart rate can certainly tolerate the addition of low-dose metoprolol for now.  Continue aspirin, clopidogrel, isosorbide.  Start metoprolol tartrate 12.5 mg twice daily for 1 week.  She can increase this to 25 mg twice daily after 1 week.  Follow-up with Dr. Acie Hall or me in 4 weeks.  2. PAD (peripheral artery disease) (HCC) History of left SFA angioplasty.  She is having significant right lower extremity claudication.  She does not have any evidence of limb threatening ischemia.  Her pulses are difficult to palpate.  She has a bruit over the right femoral artery.  I will try to arrange urgent follow-up with Dr. Fletcher Hall.  We will also arrange ABIs and arterial Dopplers before this visit.  3. Essential hypertension The patient's blood pressure is controlled on her current regimen.  Continue current therapy.   4. Hyperlipidemia LDL goal <70 She has significant uncontrolled lipids.  She had no response to PCSK9 inhibitor.  She is followed closely by Dr. Debara Pickett.  He is trying to arrange  Inclisiran for her.           Dispo:  Return in about 4 weeks (around 03/23/2021) for Routine Follow Up, w/ Dr. Acie Hall, or Richardson Dopp, PA-C, in person.   Medication Adjustments/Labs and Tests Ordered: Current medicines are  reviewed at length with the patient today.  Concerns regarding medicines are outlined above.  Tests Ordered: Orders Placed This Encounter  Procedures  . EKG 12-Lead  . VAS [place] LOWER EXTREMITY ARTERIAL DUPLEX  . VAS [place] ABI WITH/WO TBI   Medication Changes: Meds ordered this encounter  Medications  . metoprolol tartrate (LOPRESSOR) 25 MG tablet    Sig: Take 0.5 tablets (12.5 mg total) by mouth 2 (two) times daily.    Dispense:  45 tablet    Refill:  3    Signed, Richardson Dopp, PA-C  02/23/2021 4:31 PM    [facility] [place], [place], [place]  [address] Phone: [phone]; Fax: [phone]

## 2021-02-22 NOTE — Telephone Encounter (Signed)
Called subject to discuss resuming IP. Left VM requesting return call.

## 2021-02-23 ENCOUNTER — Ambulatory Visit: Payer: Medicare Other | Admitting: Physician Assistant

## 2021-02-23 ENCOUNTER — Encounter: Payer: Self-pay | Admitting: Physician Assistant

## 2021-02-23 ENCOUNTER — Other Ambulatory Visit: Payer: Self-pay

## 2021-02-23 VITALS — BP 130/70 | HR 93 | Ht <= 58 in | Wt 122.6 lb

## 2021-02-23 DIAGNOSIS — E785 Hyperlipidemia, unspecified: Secondary | ICD-10-CM

## 2021-02-23 DIAGNOSIS — I25709 Atherosclerosis of coronary artery bypass graft(s), unspecified, with unspecified angina pectoris: Secondary | ICD-10-CM | POA: Diagnosis not present

## 2021-02-23 DIAGNOSIS — I739 Peripheral vascular disease, unspecified: Secondary | ICD-10-CM

## 2021-02-23 DIAGNOSIS — I10 Essential (primary) hypertension: Secondary | ICD-10-CM | POA: Diagnosis not present

## 2021-02-23 MED ORDER — METOPROLOL TARTRATE 25 MG PO TABS: 12.5000 mg | ORAL_TABLET | Freq: Two times a day (BID) | ORAL | 3 refills | Status: DC

## 2021-02-23 NOTE — Patient Instructions (Signed)
Medication Instructions:  Your physician has recommended you make the following change in your medication:   1.  Start Metoprolol tartrate one half tablet by mouth ( 12.5 mg) twice daily if you are ok on this dose stay on it.  If not you increase to one tablet by mouth ( 25 mg) twice daily. Sent in today to requested pharmacy.   *If you need a refill on your cardiac medications before your next appointment, please call your pharmacy*   Lab Work: -None  If you have labs (blood work) drawn today and your tests are completely normal, you will receive your results only by: [name] MyChart Message (if you have MyChart) OR . A paper copy in the mail If you have any lab test that is abnormal or we need to change your treatment, we will call you to review the results.   Testing/Procedures: Your physician has requested that you have an ankle brachial index (ABI). April 5 @ 1:00 at the [facility] location.  During this test an ultrasound and blood pressure cuff are used to evaluate the arteries that supply the arms and legs with blood. Allow thirty minutes for this exam. There are no restrictions or special instructions.  Your physician has requested that you have a lower extremity arterial exercise duplex. During this test, exercise and ultrasound are used to evaluate arterial blood flow in the legs. Allow one hour for this exam. There are no restrictions or special instructions.    Follow-Up: At [facility], you and your health needs are our priority.  As part of our continuing mission to provide you with exceptional heart care, we have created designated Provider Care Teams.  These Care Teams include your primary Cardiologist (physician) and Advanced Practice Providers (APPs -  Physician Assistants and Nurse Practitioners) who all work together to provide you with the care you need, when you need it.  We recommend signing up for the patient portal called "MyChart".  Sign up information is provided on  this After Visit Summary.  MyChart is used to connect with patients for Virtual Visits (Telemedicine).  Patients are able to view lab/test results, encounter notes, upcoming appointments, etc.  Non-urgent messages can be sent to your provider as well.   To learn more about what you can do with MyChart, go to [email or web address].    Your physician recommends that you keep your scheduled  follow-up appointment with Dr. Acie Fredrickson on Monday, April 11  Your physician recommends that you keep your scheduled follow-up appointment with Dr. Fletcher Anon on April 19 at the [facility] location.

## 2021-02-25 ENCOUNTER — Telehealth: Payer: Self-pay | Admitting: *Deleted

## 2021-02-25 NOTE — Telephone Encounter (Signed)
Completed phone call/medical chart review for CLEAR visit T18,M48. There are no new AE's or SAE's to report that have not already been reported to the sponsor. All concomitant medications have been reviewed and updated if applicable. Next phone call will be in approximately 3 months. EOS visits/calls will start after May 16th, 2022.

## 2021-03-09 ENCOUNTER — Ambulatory Visit (HOSPITAL_COMMUNITY)
Admission: RE | Admit: 2021-03-09 | Discharge: 2021-03-09 | Disposition: A | Discharge status: Home / Self Care | Payer: Medicare Other | Source: Ambulatory Visit | Attending: Cardiovascular Disease | Admitting: Cardiovascular Disease

## 2021-03-09 ENCOUNTER — Other Ambulatory Visit: Payer: Self-pay

## 2021-03-09 DIAGNOSIS — I10 Essential (primary) hypertension: Secondary | ICD-10-CM | POA: Insufficient documentation

## 2021-03-09 DIAGNOSIS — I739 Peripheral vascular disease, unspecified: Secondary | ICD-10-CM | POA: Insufficient documentation

## 2021-03-09 DIAGNOSIS — I25709 Atherosclerosis of coronary artery bypass graft(s), unspecified, with unspecified angina pectoris: Secondary | ICD-10-CM | POA: Insufficient documentation

## 2021-03-09 DIAGNOSIS — E785 Hyperlipidemia, unspecified: Secondary | ICD-10-CM | POA: Diagnosis not present

## 2021-03-11 ENCOUNTER — Encounter: Payer: Self-pay | Admitting: Physician Assistant

## 2021-03-13 ENCOUNTER — Encounter: Payer: Self-pay | Admitting: Cardiovascular Disease

## 2021-03-13 NOTE — Progress Notes (Signed)
Cardiology Office Note   Date:  03/15/2021   ID:  Katherine Hall, DOB 12-27-1940, MRN [id number]  PCP:  Stacie Glaze, DO  Cardiologist:   Mertie Moores, MD , [facility]   Chief Complaint  Patient presents with  . Coronary Artery Disease   Problem list 1. Coronary artery disease-status post coronary artery bypass grafting-  CORONARY ARTERY BYPASS GRAFTING x 5 -LIMA to LAD -SVG to DIAGONAL -SVG to PDA -SEQ SVG OM1 and OM2  2. Hypertension 3. Hyperlipidemia 4. Anemia     Katherine Hall is a 80 y.o. female who presents for follow-up of her coronary artery disease. She had coronary artery bypass grafting in August. 2017   She has had a moderate left pleural effusion Still gradually improving . Needs an order to do cardiac rehab.  Wants to do heart strides at [facility]   Breathing is better.  Still has some chest soreness,  The angina is better   Has very high lipids.   Cannot tolerate statins.  Has seen [facility] [place] to initiate Repatha - working on that  Still sleeping in a recliner  Jan. 8, 2018:  Katherine Hall is doing well. She had coronary artery bypass grafting in August, 2017: Is not doing cardiac rehab.  Has lots of leg pain  Needs to have surgery on her right knee Has SVG harvest site pain .  Does not take her BP regularly  Aug. 22, 2018:  Katherine Hall is still having some chest pains. Described as a discomfort.  Last 5 minutes. She does not know if it worsens with twisting or taking a deep breath  Is not exercising ,.   May 01, 2018: Seen for follow-up of her coronary artery disease.  She is status post coronary artery bypass grafting in August, 2017.  Also has a history of hypertension and hyperlipidemia. No cp.   Not exercising much  Has PVD,   Had a coated stent placed.  Complicated by a significant groin bleed Required transfusion of 2 u PRBS   Some various aches and pains .  Has some left neck pain .   Lasts for a few seconds.  Has had right  knee  surgery since I last saw her.   No CP or dyspnea , Gets fatigued easily . Does not sleep well   Dec. 2, 2019:  Not exercising  Leg pain  No CP  Has arthrisit and PAD  Her Lipids are very high. Did well with Repatha but she couldn't afford it  Is in the Clear trial  But couldn't tolerate the Bempidoic acid    Aug. 25, 2020  Doing well Has some pain in her left leg.  Pain increases with waking  Concerned with PAD  Is in a cholesterol study so we do not have chol levels since 2018.   March 12, 2020:  Katherine Hall  was seen today for a follow-up of her hyperlipidemia, coronary artery disease, leg pain. Lipids are still elevated.    Nov. 8, 2021: Katherine Hall was seen today for follow up of her HLD, CAD, PAD  Has been referred to Lipid clinic and has seen Dr. Debara Pickett. BP has been elevated.   BP readings at home have been elevated. I reviewed her BP log.     Went to the ER with CP , left shoulder pain . BP was elevated  She is on hydrochlorothiazide 12.5 mg a day and lisinopril 20 mg twice a day.  I reviewed her diet.  Does not eat much salt.  .  Lipids have continued to be elevated.   She is seeing Dr. Debara Pickett and is working on getting into a study for a higher dose Repatha once a month.    March 15, 2021: Katherine Hall is seen today for follow up of her CAD/ CABG, HLD, PAD  Cath on February 03, 2021: Severe native CAD LIMA - LAD patent SVG- OM1/OM3 - patent SVG -D1 - 60% stenosis SVG - PDA - sequential 90%, 95%, 90%  stenosis PCI attempt was unsuccessful.   Resulted in closue of the SVG and a small inf. MI  She was treated medically and did well .  Has had to take several NTG since that time .   She has claudication and will follow up with Dr. Fletcher Anon Still having lots of claudication.   She is scheduled  She sees Dr. Debara Pickett for HLD.   Does not tolerate statins and zetia  Did not get a desired response from [place]. Considered Inclisiran - she wants to take this  Will reach out to Dr.  Debara Pickett     Past Medical History:  Diagnosis Date  . Anemia   . Arthritis   . Coronary artery disease    s/p CABG in 2017 // Myoview 11/21: No ischemia or infarction, EF > 65, low risk  // [place] >> cath 3/22: S-RPDA w severe disease - PCI unsuccessful & c/b peri-procedure MI (acute closure w wire manipulation>>inf ST elev) >> Med Rx   . Echocardiogram 02/2021    EF 65-70, no RWMA, mild LVH, Gr 1 DD, normal RVSF, RVSP 19.5, mild AI, mild AV sclerosis  . Familial hypercholesterolemia    a. h/o intolerance of statins, prev on Repatha but insurance stopped covering.  . Gastric ulcer    a. h/o bleeding ulcer 2009 requiring 3 pints of blood.  [name] History of removal of cyst    a. 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  [name] HTN (hypertension)   . Hypothyroidism   . PAD (peripheral artery disease) (HCC)    S/p L SFA PTA // ABIs/LE arterial US 4/22: R 0.7; L 0.86 // R SFA 75-99; L SFA angioplasty site 30-49, distal 50-74 // ectatic distal aorta 2.6 x 2.6 cm  . Prediabetes     Past Surgical History:  Procedure Laterality Date  . ABDOMINAL AORTOGRAM W/LOWER EXTREMITY Bilateral 11/06/2019   Procedure: ABDOMINAL AORTOGRAM W/LOWER EXTREMITY;  Surgeon: Wellington Hampshire, MD;  Location: [place] [facility];  Service: Cardiovascular;  Laterality: Bilateral;  . ABDOMINAL HYSTERECTOMY    . APPENDECTOMY    . CARDIAC CATHETERIZATION N/A 07/05/2016   Procedure: Left Heart Cath and Coronary Angiography;  Surgeon: Burnell Blanks, MD;  Location: [place] [facility];  Service: Cardiovascular;  Laterality: N/A;  . CARDIAC SURGERY     cyst, not on heart.  . CORONARY ARTERY BYPASS GRAFT N/A 07/07/2016   Procedure: CORONARY ARTERY BYPASS GRAFTING (CABG) x 5 with endoscopic harvesting of the right greater saphenous vein;  Surgeon: Gaye Pollack, MD;  Location: [place] [facility];  Service: Open Heart Surgery;  Laterality: N/A;  . CORONARY BALLOON ANGIOPLASTY N/A 02/03/2021   Procedure: CORONARY BALLOON  ANGIOPLASTY;  Surgeon: Wellington Hampshire, MD;  Location: [place] [facility];  Service: Cardiovascular;  Laterality: N/A;  svg to pda  . HEMORRHOID SURGERY    . INTRAOPERATIVE TRANSESOPHAGEAL ECHOCARDIOGRAM N/A 07/07/2016   Procedure: INTRAOPERATIVE TRANSESOPHAGEAL ECHOCARDIOGRAM;  Surgeon: Gaye Pollack,  MD;  Location: [facility];  Service: Open Heart Surgery;  Laterality: N/A;  . LEFT HEART CATH AND CORS/GRAFTS ANGIOGRAPHY N/A 02/03/2021   Procedure: LEFT HEART CATH AND CORS/GRAFTS ANGIOGRAPHY;  Surgeon: Wellington Hampshire, MD;  Location: [place] [facility];  Service: Cardiovascular;  Laterality: N/A;  . PERIPHERAL VASCULAR BALLOON ANGIOPLASTY  11/06/2019   Procedure: PERIPHERAL VASCULAR BALLOON ANGIOPLASTY;  Surgeon: Wellington Hampshire, MD;  Location: [place] [facility];  Service: Cardiovascular;;  cutting and dcb  . SHOULDER SURGERY       Current Outpatient Medications  Medication Sig Dispense Refill  . acetaminophen (TYLENOL) 650 MG CR tablet Take 1,300 mg by mouth in the morning and at bedtime.    [name] acyclovir (ZOVIRAX) 400 MG tablet Take 800 mg by mouth daily as needed (fever blisters).     [name] alendronate (FOSAMAX) 70 MG tablet Take 70 mg by mouth every Saturday.     [name] aspirin 81 MG chewable tablet Chew 1 tablet (81 mg total) by mouth daily.    . clopidogrel (PLAVIX) 75 MG tablet Take 1 tablet (75 mg total) by mouth daily. 90 tablet 3  . diphenhydramine-acetaminophen (TYLENOL PM) 25-500 MG TABS tablet Take 2 tablets by mouth at bedtime as needed (sleep).    . fluticasone (FLONASE) 50 MCG/ACT nasal spray Place 2 sprays into both nostrils daily as needed for allergies.     [name] gatifloxacin (ZYMAXID) 0.5 % SOLN Place 1 drop into the left eye 2 (two) times daily.    . isosorbide mononitrate (IMDUR) 30 MG 24 hr tablet Take 1 tablet (30 mg total) by mouth daily. 90 tablet 3  . levothyroxine (SYNTHROID, LEVOTHROID) 50 MCG tablet Take 50 mcg by mouth daily.    . metoprolol tartrate (LOPRESSOR) 25 MG tablet  Take 0.5 tablets (12.5 mg total) by mouth 2 (two) times daily. 45 tablet 3  . nitroGLYCERIN (NITROSTAT) 0.4 MG SL tablet Place 1 tablet (0.4 mg total) under the tongue every 5 (five) minutes x 3 doses as needed for chest pain. 25 tablet 1  . pantoprazole (PROTONIX) 20 MG tablet Take 20 mg by mouth 2 (two) times daily.    [name] rOPINIRole (REQUIP) 2 MG tablet Take 2 mg by mouth at bedtime.    . traMADol (ULTRAM) 50 MG tablet Take 1 tablet (50 mg total) by mouth every 6 (six) hours as needed for severe pain. 28 tablet 0   No current facility-administered medications for this visit.    Allergies:   Penicillins, Lincomycin, Naproxen sodium, Statins, Latex, and Tape    Social History:  The patient  reports that she has never smoked. She has never used smokeless tobacco. She reports that she does not drink alcohol and does not use drugs.   Family History:  The patient's family history includes CAD in her father; Coronary artery disease in her brother and sister; Stroke in her mother.    ROS:   Noted in current history, otherwise review of systems is negative.   Physical Exam: Blood pressure (!) 122/58, pulse 64, height 4\' 8"  (1.422 m), weight 123 lb (55.8 kg), SpO2 98 %.  GEN:  Well nourished, well developed in no acute distress HEENT: Normal NECK: No JVD; No carotid bruits LYMPHATICS: No lymphadenopathy CARDIAC: RRR , no murmurs, rubs, gallops RESPIRATORY:  Clear to auscultation without rales, wheezing or rhonchi  ABDOMEN: Soft, non-tender, non-distended MUSCULOSKELETAL:  No edema; No deformity  SKIN: Warm and dry NEUROLOGIC:  Alert and oriented x 3  Recent Labs: 02/02/2021: TSH 0.960 02/03/2021: ALT 11 02/04/2021: BUN 10; Creatinine, Ser 0.87; Hemoglobin 10.4; Platelets 301; Potassium 3.6; Sodium 132    Lipid Panel    Component Value Date/Time   CHOL 356 (H) 12/10/2020 1007   TRIG 213 (H) 12/10/2020 1007   HDL 71 12/10/2020 1007   CHOLHDL 5.0 (H) 12/10/2020 1007   CHOLHDL 4.6  11/08/2019 0404   VLDL 21 11/08/2019 0404   LDLCALC 243 (H) 12/10/2020 1007      Wt Readings from Last 3 Encounters:  03/15/21 123 lb (55.8 kg)  02/23/21 122 lb 9.6 oz (55.6 kg)  02/15/21 125 lb 9.6 oz (57 kg)      Other studies Reviewed: Additional studies/ records that were reviewed today include: . Review of the above records demonstrates:    ASSESSMENT AND PLAN:  1. Coronary artery disease:  Had a NSTEMI when Dr. Fletcher Anon attempted PCI of a SVG.   Seems to be doing OK     2. Pure  Hyperlipidemia :  Is seeing Dr. Debara Pickett for HLD   He mentioned Inclisiran.  She would like to go ahead and start this if possible.      3.  Peripheral arterial disease:   Managed by Dr. Fletcher Anon     3. HTN:    BP is well controlled   4. Fatigue:      Current medicines are reviewed at length with the patient today.  The patient does not have concerns regarding medicines.  Labs/ tests ordered today include:   No orders of the defined types were placed in this encounter.    Disposition:   Follow up with me in a year     Mertie Moores, MD  03/15/2021 11:40 AM    [address], [place], [place]  [address] Phone: [phone]; Fax: [phone]

## 2021-03-15 ENCOUNTER — Encounter: Payer: Self-pay | Admitting: Cardiovascular Disease

## 2021-03-15 ENCOUNTER — Ambulatory Visit: Payer: Medicare Other | Admitting: Cardiovascular Disease

## 2021-03-15 ENCOUNTER — Telehealth: Payer: Self-pay | Admitting: Physician Assistant

## 2021-03-15 ENCOUNTER — Other Ambulatory Visit: Payer: Self-pay

## 2021-03-15 VITALS — BP 122/58 | HR 64 | Ht <= 58 in | Wt 123.0 lb

## 2021-03-15 DIAGNOSIS — E7801 Familial hypercholesterolemia: Secondary | ICD-10-CM

## 2021-03-15 DIAGNOSIS — I2510 Atherosclerotic heart disease of native coronary artery without angina pectoris: Secondary | ICD-10-CM | POA: Diagnosis not present

## 2021-03-15 DIAGNOSIS — I739 Peripheral vascular disease, unspecified: Secondary | ICD-10-CM | POA: Diagnosis not present

## 2021-03-15 NOTE — Patient Instructions (Signed)
Medication Instructions:  Your physician recommends that you continue on your current medications as directed. Please refer to the Current Medication list given to you today.  *If you need a refill on your cardiac medications before your next appointment, please call your pharmacy*   Lab Work: none If you have labs (blood work) drawn today and your tests are completely normal, you will receive your results only by: [name] MyChart Message (if you have MyChart) OR . A paper copy in the mail If you have any lab test that is abnormal or we need to change your treatment, we will call you to review the results.   Testing/Procedures: none   Follow-Up: At [facility], you and your health needs are our priority.  As part of our continuing mission to provide you with exceptional heart care, we have created designated Provider Care Teams.  These Care Teams include your primary Cardiologist (physician) and Advanced Practice Providers (APPs -  Physician Assistants and Nurse Practitioners) who all work together to provide you with the care you need, when you need it.  We recommend signing up for the patient portal called "MyChart".  Sign up information is provided on this After Visit Summary.  MyChart is used to connect with patients for Virtual Visits (Telemedicine).  Patients are able to view lab/test results, encounter notes, upcoming appointments, etc.  Non-urgent messages can be sent to your provider as well.   To learn more about what you can do with MyChart, go to [email or web address].    Your next appointment:   1 year(s)  The format for your next appointment:   In Person  Provider:   You may see Mertie Moores, MD or one of the following Advanced Practice Providers on your designated Care Team:    Richardson Dopp, PA-C  [place], [place]

## 2021-03-15 NOTE — Telephone Encounter (Signed)
Patient states she called the wrong office.

## 2021-03-15 NOTE — Telephone Encounter (Signed)
Pt is calling stating she is returning Ann's phone call from last week with some f/u questions.please advise

## 2021-03-15 NOTE — Telephone Encounter (Signed)
Nahser pt that he seen today.  Will route to Dr. Elmarie Shiley nurse.

## 2021-03-17 ENCOUNTER — Telehealth: Payer: Self-pay | Admitting: Internal Medicine

## 2021-03-17 NOTE — Telephone Encounter (Signed)
Called [employer] @ [phone] per [place] Downer statement of benefits form  -- was transferred to [place] rep informed this Probation officer that PA for Leqvio would need to be submitted thru [facility] provider portal -- asked rep if form could be faxed to do the PA, while rep was checking on this, the call dropped   Attempted to log in to the [facility] provider portal -- will seek assistance from billing dept

## 2021-03-23 ENCOUNTER — Ambulatory Visit: Payer: Medicare Other | Admitting: Cardiovascular Disease

## 2021-03-23 ENCOUNTER — Encounter: Payer: Self-pay | Admitting: Cardiovascular Disease

## 2021-03-23 ENCOUNTER — Other Ambulatory Visit: Payer: Self-pay

## 2021-03-23 VITALS — BP 120/79 | HR 61 | Ht <= 58 in | Wt 124.6 lb

## 2021-03-23 DIAGNOSIS — I10 Essential (primary) hypertension: Secondary | ICD-10-CM | POA: Diagnosis not present

## 2021-03-23 DIAGNOSIS — I2510 Atherosclerotic heart disease of native coronary artery without angina pectoris: Secondary | ICD-10-CM | POA: Diagnosis not present

## 2021-03-23 DIAGNOSIS — I739 Peripheral vascular disease, unspecified: Secondary | ICD-10-CM

## 2021-03-23 DIAGNOSIS — E7801 Familial hypercholesterolemia: Secondary | ICD-10-CM

## 2021-03-23 NOTE — Progress Notes (Signed)
Cardiology Office Note   Date:  03/23/2021   ID:  Katherine Hall, DOB 18-Apr-1941, MRN [id number]  PCP:  Stacie Glaze, DO  Cardiologist: Dr. Acie Fredrickson  No chief complaint on file.     History of Present Illness: Katherine Hall is a 80 y.o. female who is here today for follow-up regarding peripheral arterial disease.   She has known history of coronary artery disease status post CABG in 2017, essential hypertension, anemia and hyperlipidemia with intolerance to statins.  She is not diabetic and is a lifelong non-smoker. The patient was seen in late 2020 for severe left calf claudication.  Vascular studies showed an ABI of 0.96 on the right and 0.85 on the left with evidence of severe left SFA stenosis. Lower extremity angiography in December 2020 showed mild to moderate iliac disease, on the right side, there was mild diffuse SFA and popliteal disease with one-vessel runoff below the knee.  On the left side there was severe mid SFA stenosis and one-vessel runoff below the knee.  I performed successful drug-coated balloon angioplasty to the mid left SFA.   She was hospitalized in March with unstable angina.  Cardiac catheterization showed severe underlying three-vessel coronary artery disease with patent grafts including LIMA to LAD, SVG to OM1/OM 3, SVG to diagonal with 60% anastomosis stenosis and SVG to right PDA.  There was 90% stenosis in the proximal portion of the SVG to right PDA followed by another 90% stenosis leading into an aneurysmal segment which was followed by a 95% stenosis.  I attempted angioplasty of SVG to right PDA but I was not able to cross the stenosis with multiple wires beyond the aneurysmal segment.  Subsequently, there was loss of flow and acute closure with wire manipulation.  The patient was treated medically and did reasonably well.  She has minimal chest discomfort at this point.  She is mostly bothered now by severe right calf claudication with rest pain.  No  lower extremity ulceration.  She underwent recent Doppler study which showed new severe stenosis in the right proximal SFA.  Past Medical History:  Diagnosis Date  . Anemia   . Arthritis   . Coronary artery disease    s/p CABG in 2017 // Myoview 11/21: No ischemia or infarction, EF > 65, low risk  // [place] >> cath 3/22: S-RPDA w severe disease - PCI unsuccessful & c/b peri-procedure MI (acute closure w wire manipulation>>inf ST elev) >> Med Rx   . Echocardiogram 02/2021    EF 65-70, no RWMA, mild LVH, Gr 1 DD, normal RVSF, RVSP 19.5, mild AI, mild AV sclerosis  . Familial hypercholesterolemia    a. h/o intolerance of statins, prev on Repatha but insurance stopped covering.  . Gastric ulcer    a. h/o bleeding ulcer 2009 requiring 3 pints of blood.  [name] History of removal of cyst    a. 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  [name] HTN (hypertension)   . Hypothyroidism   . PAD (peripheral artery disease) (HCC)    S/p L SFA PTA // ABIs/LE arterial US 4/22: R 0.7; L 0.86 // R SFA 75-99; L SFA angioplasty site 30-49, distal 50-74 // ectatic distal aorta 2.6 x 2.6 cm  . Prediabetes     Past Surgical History:  Procedure Laterality Date  . ABDOMINAL AORTOGRAM W/LOWER EXTREMITY Bilateral 11/06/2019   Procedure: ABDOMINAL AORTOGRAM W/LOWER EXTREMITY;  Surgeon: Wellington Hampshire, MD;  Location: [place] [facility]  [facility];  Service: Cardiovascular;  Laterality: Bilateral;  . ABDOMINAL HYSTERECTOMY    . APPENDECTOMY    . CARDIAC CATHETERIZATION N/A 07/05/2016   Procedure: Left Heart Cath and Coronary Angiography;  Surgeon: Burnell Blanks, MD;  Location: [place] [facility];  Service: Cardiovascular;  Laterality: N/A;  . CARDIAC SURGERY     cyst, not on heart.  . CORONARY ARTERY BYPASS GRAFT N/A 07/07/2016   Procedure: CORONARY ARTERY BYPASS GRAFTING (CABG) x 5 with endoscopic harvesting of the right greater saphenous vein;  Surgeon: Gaye Pollack, MD;  Location: [place] OR;  Service:  Open Heart Surgery;  Laterality: N/A;  . CORONARY BALLOON ANGIOPLASTY N/A 02/03/2021   Procedure: CORONARY BALLOON ANGIOPLASTY;  Surgeon: Wellington Hampshire, MD;  Location: [place] [facility];  Service: Cardiovascular;  Laterality: N/A;  svg to pda  . HEMORRHOID SURGERY    . INTRAOPERATIVE TRANSESOPHAGEAL ECHOCARDIOGRAM N/A 07/07/2016   Procedure: INTRAOPERATIVE TRANSESOPHAGEAL ECHOCARDIOGRAM;  Surgeon: Gaye Pollack, MD;  Location: [facility] OR;  Service: Open Heart Surgery;  Laterality: N/A;  . LEFT HEART CATH AND CORS/GRAFTS ANGIOGRAPHY N/A 02/03/2021   Procedure: LEFT HEART CATH AND CORS/GRAFTS ANGIOGRAPHY;  Surgeon: Wellington Hampshire, MD;  Location: [place] [facility];  Service: Cardiovascular;  Laterality: N/A;  . PERIPHERAL VASCULAR BALLOON ANGIOPLASTY  11/06/2019   Procedure: PERIPHERAL VASCULAR BALLOON ANGIOPLASTY;  Surgeon: Wellington Hampshire, MD;  Location: [place] [facility];  Service: Cardiovascular;;  cutting and dcb  . SHOULDER SURGERY       Current Outpatient Medications  Medication Sig Dispense Refill  . acetaminophen (TYLENOL) 650 MG CR tablet Take 1,300 mg by mouth in the morning and at bedtime.    [name] acyclovir (ZOVIRAX) 400 MG tablet Take 800 mg by mouth daily as needed (fever blisters).     [name] alendronate (FOSAMAX) 70 MG tablet Take 70 mg by mouth every Saturday.     [name] aspirin 81 MG chewable tablet Chew 1 tablet (81 mg total) by mouth daily.    . clopidogrel (PLAVIX) 75 MG tablet Take 1 tablet (75 mg total) by mouth daily. 90 tablet 3  . diphenhydramine-acetaminophen (TYLENOL PM) 25-500 MG TABS tablet Take 2 tablets by mouth at bedtime as needed (sleep).    . fluticasone (FLONASE) 50 MCG/ACT nasal spray Place 2 sprays into both nostrils daily as needed for allergies.     [name] gatifloxacin (ZYMAXID) 0.5 % SOLN Place 1 drop into the left eye 2 (two) times daily.    . isosorbide mononitrate (IMDUR) 30 MG 24 hr tablet Take 1 tablet (30 mg total) by mouth daily. 90 tablet 3  . levothyroxine  (SYNTHROID, LEVOTHROID) 50 MCG tablet Take 50 mcg by mouth daily.    . metoprolol tartrate (LOPRESSOR) 25 MG tablet Take 0.5 tablets (12.5 mg total) by mouth 2 (two) times daily. 45 tablet 3  . nitroGLYCERIN (NITROSTAT) 0.4 MG SL tablet Place 1 tablet (0.4 mg total) under the tongue every 5 (five) minutes x 3 doses as needed for chest pain. 25 tablet 1  . pantoprazole (PROTONIX) 20 MG tablet Take 20 mg by mouth 2 (two) times daily.    [name] rOPINIRole (REQUIP) 2 MG tablet Take 2 mg by mouth at bedtime.    . traMADol (ULTRAM) 50 MG tablet Take 1 tablet (50 mg total) by mouth every 6 (six) hours as needed for severe pain. 28 tablet 0   No current facility-administered medications for this visit.    Allergies:   Penicillins, Lincomycin,  Naproxen sodium, Statins, Latex, and Tape    Social History:  The patient  reports that she has never smoked. She has never used smokeless tobacco. She reports that she does not drink alcohol and does not use drugs.   Family History:  The patient's family history includes CAD in her father; Coronary artery disease in her brother and sister; Stroke in her mother.    ROS:  Please see the history of present illness.   Otherwise, review of systems are positive for none.   All other systems are reviewed and negative.    PHYSICAL EXAM: VS:  BP 120/79   Pulse 61   Ht 4\' 8"  (1.422 m)   Wt 124 lb 9.6 oz (56.5 kg)   SpO2 94%   BMI 27.93 kg/m  , BMI Body mass index is 27.93 kg/m. GEN: Well nourished, well developed, in no acute distress  HEENT: normal  Neck: no JVD, carotid bruits, or masses Cardiac: RRR; no murmurs, rubs, or gallops,no edema  Respiratory:  clear to auscultation bilaterally, normal work of breathing GI: soft, nontender, nondistended, + BS MS: no deformity or atrophy  Skin: warm and dry, no rash Neuro:  Strength and sensation are intact Psych: euthymic mood, full affect Vascular: Femoral pulses +2 bilaterally. Dorsalis pedis is palpable on the  left side but not the right side   EKG:  EKG is not ordered today.    Recent Labs: 02/02/2021: TSH 0.960 02/03/2021: ALT 11 02/04/2021: BUN 10; Creatinine, Ser 0.87; Hemoglobin 10.4; Platelets 301; Potassium 3.6; Sodium 132    Lipid Panel    Component Value Date/Time   CHOL 356 (H) 12/10/2020 1007   TRIG 213 (H) 12/10/2020 1007   HDL 71 12/10/2020 1007   CHOLHDL 5.0 (H) 12/10/2020 1007   CHOLHDL 4.6 11/08/2019 0404   VLDL 21 11/08/2019 0404   LDLCALC 243 (H) 12/10/2020 1007      Wt Readings from Last 3 Encounters:  03/23/21 124 lb 9.6 oz (56.5 kg)  03/15/21 123 lb (55.8 kg)  02/23/21 122 lb 9.6 oz (55.6 kg)       No flowsheet data found.    ASSESSMENT AND PLAN:  1.  Peripheral arterial disease: Severe right calf claudication with rest pain.  Recent duplex showed new severe stenosis in the proximal SFA which is the likely culprit.  No recurrent claudication on the left side post endovascular intervention in 2020.   Given severity of her symptoms, recommend proceeding with abdominal aortogram with lower extremity runoff and possible endovascular intervention.  I discussed the procedure in details as well as risks and benefits.  Planned access is via the left common femoral artery.  He is already on dual antiplatelet therapy.  2.  Coronary artery disease involving native coronary arteries: Status post CABG in 2017 .  Recent unstable angina with severe stenosis and SVG to right PDA which was not amenable to PCI.  The other grafts were patent.  Continue medical therapy.  3. Familial hyperlipidemia with severely elevated LDL: Intolerance to statins.  She is managed by Dr. Debara Pickett.  4.  Essential hypertension: Blood pressure is reasonably controlled.      Disposition:   FU with me in 1 months  Signed,  [name], MD  03/23/2021 12:53 PM    [place]

## 2021-03-23 NOTE — H&P (View-Only) (Signed)
Cardiology Office Note   Date:  03/23/2021   ID:  Katherine Hall, DOB 1941-06-23, MRN [id number]  PCP:  Stacie Glaze, DO  Cardiologist: Dr. Acie Fredrickson  No chief complaint on file.     History of Present Illness: Katherine Hall is a 80 y.o. female who is here today for follow-up regarding peripheral arterial disease.   She has known history of coronary artery disease status post CABG in 2017, essential hypertension, anemia and hyperlipidemia with intolerance to statins.  She is not diabetic and is a lifelong non-smoker. The patient was seen in late 2020 for severe left calf claudication.  Vascular studies showed an ABI of 0.96 on the right and 0.85 on the left with evidence of severe left SFA stenosis. Lower extremity angiography in December 2020 showed mild to moderate iliac disease, on the right side, there was mild diffuse SFA and popliteal disease with one-vessel runoff below the knee.  On the left side there was severe mid SFA stenosis and one-vessel runoff below the knee.  I performed successful drug-coated balloon angioplasty to the mid left SFA.   She was hospitalized in March with unstable angina.  Cardiac catheterization showed severe underlying three-vessel coronary artery disease with patent grafts including LIMA to LAD, SVG to OM1/OM 3, SVG to diagonal with 60% anastomosis stenosis and SVG to right PDA.  There was 90% stenosis in the proximal portion of the SVG to right PDA followed by another 90% stenosis leading into an aneurysmal segment which was followed by a 95% stenosis.  I attempted angioplasty of SVG to right PDA but I was not able to cross the stenosis with multiple wires beyond the aneurysmal segment.  Subsequently, there was loss of flow and acute closure with wire manipulation.  The patient was treated medically and did reasonably well.  She has minimal chest discomfort at this point.  She is mostly bothered now by severe right calf claudication with rest pain.  No  lower extremity ulceration.  She underwent recent Doppler study which showed new severe stenosis in the right proximal SFA.  Past Medical History:  Diagnosis Date  . Anemia   . Arthritis   . Coronary artery disease    s/p CABG in 2017 // Myoview 11/21: No ischemia or infarction, EF > 65, low risk  // [place] >> cath 3/22: S-RPDA w severe disease - PCI unsuccessful & c/b peri-procedure MI (acute closure w wire manipulation>>inf ST elev) >> Med Rx   . Echocardiogram 02/2021    EF 65-70, no RWMA, mild LVH, Gr 1 DD, normal RVSF, RVSP 19.5, mild AI, mild AV sclerosis  . Familial hypercholesterolemia    a. h/o intolerance of statins, prev on Repatha but insurance stopped covering.  . Gastric ulcer    a. h/o bleeding ulcer 2009 requiring 3 pints of blood.  [name] History of removal of cyst    a. 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  [name] HTN (hypertension)   . Hypothyroidism   . PAD (peripheral artery disease) (HCC)    S/p L SFA PTA // ABIs/LE arterial US 4/22: R 0.7; L 0.86 // R SFA 75-99; L SFA angioplasty site 30-49, distal 50-74 // ectatic distal aorta 2.6 x 2.6 cm  . Prediabetes     Past Surgical History:  Procedure Laterality Date  . ABDOMINAL AORTOGRAM W/LOWER EXTREMITY Bilateral 11/06/2019   Procedure: ABDOMINAL AORTOGRAM W/LOWER EXTREMITY;  Surgeon: Wellington Hampshire, MD;  Location: [facility];  Service: Cardiovascular;  Laterality: Bilateral;  . ABDOMINAL HYSTERECTOMY    . APPENDECTOMY    . CARDIAC CATHETERIZATION N/A 07/05/2016   Procedure: Left Heart Cath and Coronary Angiography;  Surgeon: Burnell Blanks, MD;  Location: [facility];  Service: Cardiovascular;  Laterality: N/A;  . CARDIAC SURGERY     cyst, not on heart.  . CORONARY ARTERY BYPASS GRAFT N/A 07/07/2016   Procedure: CORONARY ARTERY BYPASS GRAFTING (CABG) x 5 with endoscopic harvesting of the right greater saphenous vein;  Surgeon: Gaye Pollack, MD;  Location: [facility];  Service:  Open Heart Surgery;  Laterality: N/A;  . CORONARY BALLOON ANGIOPLASTY N/A 02/03/2021   Procedure: CORONARY BALLOON ANGIOPLASTY;  Surgeon: Wellington Hampshire, MD;  Location: [facility];  Service: Cardiovascular;  Laterality: N/A;  svg to pda  . HEMORRHOID SURGERY    . INTRAOPERATIVE TRANSESOPHAGEAL ECHOCARDIOGRAM N/A 07/07/2016   Procedure: INTRAOPERATIVE TRANSESOPHAGEAL ECHOCARDIOGRAM;  Surgeon: Gaye Pollack, MD;  Location: [facility];  Service: Open Heart Surgery;  Laterality: N/A;  . LEFT HEART CATH AND CORS/GRAFTS ANGIOGRAPHY N/A 02/03/2021   Procedure: LEFT HEART CATH AND CORS/GRAFTS ANGIOGRAPHY;  Surgeon: Wellington Hampshire, MD;  Location: [facility];  Service: Cardiovascular;  Laterality: N/A;  . PERIPHERAL VASCULAR BALLOON ANGIOPLASTY  11/06/2019   Procedure: PERIPHERAL VASCULAR BALLOON ANGIOPLASTY;  Surgeon: Wellington Hampshire, MD;  Location: [facility];  Service: Cardiovascular;;  cutting and dcb  . SHOULDER SURGERY       Current Outpatient Medications  Medication Sig Dispense Refill  . acetaminophen (TYLENOL) 650 MG CR tablet Take 1,300 mg by mouth in the morning and at bedtime.    [name] acyclovir (ZOVIRAX) 400 MG tablet Take 800 mg by mouth daily as needed (fever blisters).     [name] alendronate (FOSAMAX) 70 MG tablet Take 70 mg by mouth every Saturday.     [name] aspirin 81 MG chewable tablet Chew 1 tablet (81 mg total) by mouth daily.    . clopidogrel (PLAVIX) 75 MG tablet Take 1 tablet (75 mg total) by mouth daily. 90 tablet 3  . diphenhydramine-acetaminophen (TYLENOL PM) 25-500 MG TABS tablet Take 2 tablets by mouth at bedtime as needed (sleep).    . fluticasone (FLONASE) 50 MCG/ACT nasal spray Place 2 sprays into both nostrils daily as needed for allergies.     [name] gatifloxacin (ZYMAXID) 0.5 % SOLN Place 1 drop into the left eye 2 (two) times daily.    . isosorbide mononitrate (IMDUR) 30 MG 24 hr tablet Take 1 tablet (30 mg total) by mouth daily. 90 tablet 3  . levothyroxine  (SYNTHROID, LEVOTHROID) 50 MCG tablet Take 50 mcg by mouth daily.    . metoprolol tartrate (LOPRESSOR) 25 MG tablet Take 0.5 tablets (12.5 mg total) by mouth 2 (two) times daily. 45 tablet 3  . nitroGLYCERIN (NITROSTAT) 0.4 MG SL tablet Place 1 tablet (0.4 mg total) under the tongue every 5 (five) minutes x 3 doses as needed for chest pain. 25 tablet 1  . pantoprazole (PROTONIX) 20 MG tablet Take 20 mg by mouth 2 (two) times daily.    [name] rOPINIRole (REQUIP) 2 MG tablet Take 2 mg by mouth at bedtime.    . traMADol (ULTRAM) 50 MG tablet Take 1 tablet (50 mg total) by mouth every 6 (six) hours as needed for severe pain. 28 tablet 0   No current facility-administered medications for this visit.    Allergies:   Penicillins, Lincomycin,  Naproxen sodium, Statins, Latex, and Tape    Social History:  The patient  reports that she has never smoked. She has never used smokeless tobacco. She reports that she does not drink alcohol and does not use drugs.   Family History:  The patient's family history includes CAD in her father; Coronary artery disease in her brother and sister; Stroke in her mother.    ROS:  Please see the history of present illness.   Otherwise, review of systems are positive for none.   All other systems are reviewed and negative.    PHYSICAL EXAM: VS:  BP 120/79   Pulse 61   Ht 4\' 8"  (1.422 m)   Wt 124 lb 9.6 oz (56.5 kg)   SpO2 94%   BMI 27.93 kg/m  , BMI Body mass index is 27.93 kg/m. GEN: Well nourished, well developed, in no acute distress  HEENT: normal  Neck: no JVD, carotid bruits, or masses Cardiac: RRR; no murmurs, rubs, or gallops,no edema  Respiratory:  clear to auscultation bilaterally, normal work of breathing GI: soft, nontender, nondistended, + BS MS: no deformity or atrophy  Skin: warm and dry, no rash Neuro:  Strength and sensation are intact Psych: euthymic mood, full affect Vascular: Femoral pulses +2 bilaterally. Dorsalis pedis is palpable on the  left side but not the right side   EKG:  EKG is not ordered today.    Recent Labs: 02/02/2021: TSH 0.960 02/03/2021: ALT 11 02/04/2021: BUN 10; Creatinine, Ser 0.87; Hemoglobin 10.4; Platelets 301; Potassium 3.6; Sodium 132    Lipid Panel    Component Value Date/Time   CHOL 356 (H) 12/10/2020 1007   TRIG 213 (H) 12/10/2020 1007   HDL 71 12/10/2020 1007   CHOLHDL 5.0 (H) 12/10/2020 1007   CHOLHDL 4.6 11/08/2019 0404   VLDL 21 11/08/2019 0404   LDLCALC 243 (H) 12/10/2020 1007      Wt Readings from Last 3 Encounters:  03/23/21 124 lb 9.6 oz (56.5 kg)  03/15/21 123 lb (55.8 kg)  02/23/21 122 lb 9.6 oz (55.6 kg)       No flowsheet data found.    ASSESSMENT AND PLAN:  1.  Peripheral arterial disease: Severe right calf claudication with rest pain.  Recent duplex showed new severe stenosis in the proximal SFA which is the likely culprit.  No recurrent claudication on the left side post endovascular intervention in 2020.   Given severity of her symptoms, recommend proceeding with abdominal aortogram with lower extremity runoff and possible endovascular intervention.  I discussed the procedure in details as well as risks and benefits.  Planned access is via the left common femoral artery.  He is already on dual antiplatelet therapy.  2.  Coronary artery disease involving native coronary arteries: Status post CABG in 2017 .  Recent unstable angina with severe stenosis and SVG to right PDA which was not amenable to PCI.  The other grafts were patent.  Continue medical therapy.  3. Familial hyperlipidemia with severely elevated LDL: Intolerance to statins.  She is managed by Dr. Debara Pickett.  4.  Essential hypertension: Blood pressure is reasonably controlled.      Disposition:   FU with me in 1 months  Signed,  [name], MD  03/23/2021 12:53 PM    [place]

## 2021-03-23 NOTE — Patient Instructions (Addendum)
Medication Instructions:  No changes *If you need a refill on your cardiac medications before your next appointment, please call your pharmacy*  Testing/Procedures: Your physician has requested that you have a peripheral vascular angiogram. This exam is performed at the hospital. During this exam IV contrast is used to look at arterial blood flow. Please review the information sheet given for details.  Your physician has requested that you have a lower extremity arterial duplex two weeks after your procedure on 03/31/21. During this test, ultrasound is used to evaluate arterial blood flow in the legs. Allow one hour for this exam. There are no restrictions or special instructions. This will take place at [place], [address].  Your physician has requested that you have an ankle brachial index (ABI) two weeks after you procedure on 03/31/21. During this test an ultrasound and blood pressure cuff are used to evaluate the arteries that supply the arms and legs with blood. Allow thirty minutes for this exam. There are no restrictions or special instructions. This will take place at [place], [address].   Follow-Up: At [facility], you and your health needs are our priority.  As part of our continuing mission to provide you with exceptional heart care, we have created designated Provider Care Teams.  These Care Teams include your primary Cardiologist (physician) and Advanced Practice Providers (APPs -  Physician Assistants and Nurse Practitioners) who all work together to provide you with the care you need, when you need it.  We recommend signing up for the patient portal called "MyChart".  Sign up information is provided on this After Visit Summary.  MyChart is used to connect with patients for Virtual Visits (Telemedicine).  Patients are able to view lab/test results, encounter notes, upcoming appointments, etc.  Non-urgent messages can be sent to your provider as well.   To learn  more about what you can do with MyChart, go to [email or web address].    Your next appointment:   Keep your post procedure follow up with Dr. Fletcher Anon on 04/27/21 at 10:20 am  Other Instructions    [address] [place] [address] Dept: [phone] Loc: [place]  03/23/2021  You are scheduled for a Peripheral Angiogram on Wednesday, April 27 with Dr. [name].  1. Please arrive at the [facility] (Main Entrance A) at [facility]: [address] [place], [place] [address] at 6:30 AM (This time is two hours before your procedure to ensure your preparation). Free valet parking service is available.   Special note: Every effort is made to have your procedure done on time. Please understand that emergencies sometimes delay scheduled procedures.  2. Diet: Do not eat solid foods after midnight.  The patient may have clear liquids until 5am upon the day of the procedure.  3. Labs: You will need to have blood drawn today 4/19  You will need to have the coronavirus test completed prior to your procedure. An appointment has been made at 1:50 pm on 03/29/21. This is a Drive Up Visit at [address], [place], [place] [address]. Please tell them that you are there for procedure testing. Stay in your car and someone will be with you shortly. Please make sure to have all other labs completed before this test because you will need to stay quarantined until your procedure.   4. Medication instructions in preparation for your procedure: Nothing to hold  On the morning of your procedure, take your Aspirin and Plavix/Clopidogrel and any morning medicines NOT listed above.  You may use sips of water.  5. Plan for one night stay--bring personal belongings. 6. Bring a current list of your medications and current insurance cards. 7. You MUST have a responsible person  to drive you home. 8. Someone MUST be with you the first 24 hours after you arrive home or your discharge will be delayed. 9. Please wear clothes that are easy to get on and off and wear slip-on shoes.  Thank you for allowing [place] to care for you!   -- [facility]

## 2021-03-24 LAB — BASIC METABOLIC PANEL
BUN/Creatinine Ratio: 20 (ref 12–28)
BUN: 17 mg/dL (ref 8–27)
CO2: 22 mmol/L (ref 20–29)
Calcium: 9 mg/dL (ref 8.7–10.3)
Chloride: 101 mmol/L (ref 96–106)
Creatinine, Ser: 0.87 mg/dL (ref 0.57–1.00)
Glucose: 85 mg/dL (ref 65–99)
Potassium: 4.8 mmol/L (ref 3.5–5.2)
Sodium: 139 mmol/L (ref 134–144)
eGFR: 67 mL/min/{1.73_m2} (ref >59)

## 2021-03-24 LAB — CBC
Hematocrit: 35.5 % (ref 34.0–46.6)
Hemoglobin: 11.7 g/dL (ref 11.1–15.9)
MCH: 31 pg (ref 26.6–33.0)
MCHC: 33 g/dL (ref 31.5–35.7)
MCV: 94 fL (ref 79–97)
Platelets: 340 10*3/uL (ref 150–450)
RBC: 3.78 x10E6/uL (ref 3.77–5.28)
RDW: 12.4 % (ref 11.7–15.4)
WBC: 7.1 10*3/uL (ref 3.4–10.8)

## 2021-03-29 ENCOUNTER — Other Ambulatory Visit (HOSPITAL_COMMUNITY)
Admission: RE | Admit: 2021-03-29 | Discharge: 2021-03-29 | Disposition: A | Discharge status: Home / Self Care | Payer: Medicare Other | Source: Ambulatory Visit | Attending: Cardiovascular Disease | Admitting: Cardiovascular Disease

## 2021-03-29 DIAGNOSIS — Z01812 Encounter for preprocedural laboratory examination: Secondary | ICD-10-CM | POA: Insufficient documentation

## 2021-03-29 DIAGNOSIS — Z20822 Contact with and (suspected) exposure to covid-19: Secondary | ICD-10-CM | POA: Diagnosis not present

## 2021-03-30 ENCOUNTER — Telehealth: Payer: Self-pay | Admitting: *Deleted

## 2021-03-30 LAB — SARS CORONAVIRUS 2 (TAT 6-24 HRS): SARS Coronavirus 2: NEGATIVE

## 2021-03-30 NOTE — Telephone Encounter (Signed)
Pt contacted pre-abdominal aortogram  scheduled at [facility] for: Wednesday March 31, 2021 8:30 AM Verified arrival time and place: [facility]) at: 6:30 AM   No solid food after midnight prior to cath, clear liquids until 5 AM day of procedure.  AM meds can be  taken pre-cath with sips of water including: ASA 81 mg Plavix 75 mg  Confirmed patient has responsible adult to drive home post procedure and be with patient first 24 hours after arriving home: yes  You are allowed ONE visitor in the waiting room during the time you are at the hospital for your procedure. Both you and your visitor must wear a mask once you enter the hospital.    Reviewed procedure/mask/visitor instructions with patient.

## 2021-03-31 ENCOUNTER — Ambulatory Visit (HOSPITAL_COMMUNITY)
Admission: RE | Admit: 2021-03-31 | Discharge: 2021-03-31 | Disposition: A | Discharge status: Home / Self Care | Payer: Medicare Other | Attending: Cardiovascular Disease | Admitting: Cardiovascular Disease

## 2021-03-31 ENCOUNTER — Encounter (HOSPITAL_COMMUNITY)
Admission: RE | Disposition: A | Discharge status: Home / Self Care | Payer: Self-pay | Source: Home / Self Care | Attending: Cardiovascular Disease

## 2021-03-31 DIAGNOSIS — I70221 Atherosclerosis of native arteries of extremities with rest pain, right leg: Secondary | ICD-10-CM | POA: Insufficient documentation

## 2021-03-31 DIAGNOSIS — Z7982 Long term (current) use of aspirin: Secondary | ICD-10-CM | POA: Diagnosis not present

## 2021-03-31 DIAGNOSIS — I10 Essential (primary) hypertension: Secondary | ICD-10-CM | POA: Diagnosis not present

## 2021-03-31 DIAGNOSIS — I70211 Atherosclerosis of native arteries of extremities with intermittent claudication, right leg: Secondary | ICD-10-CM | POA: Diagnosis not present

## 2021-03-31 DIAGNOSIS — Z79890 Hormone replacement therapy (postmenopausal): Secondary | ICD-10-CM | POA: Insufficient documentation

## 2021-03-31 DIAGNOSIS — Z79899 Other long term (current) drug therapy: Secondary | ICD-10-CM | POA: Insufficient documentation

## 2021-03-31 DIAGNOSIS — Z888 Allergy status to other drugs, medicaments and biological substances status: Secondary | ICD-10-CM | POA: Diagnosis not present

## 2021-03-31 DIAGNOSIS — Z91040 Latex allergy status: Secondary | ICD-10-CM | POA: Diagnosis not present

## 2021-03-31 DIAGNOSIS — I2510 Atherosclerotic heart disease of native coronary artery without angina pectoris: Secondary | ICD-10-CM | POA: Diagnosis not present

## 2021-03-31 DIAGNOSIS — Z886 Allergy status to analgesic agent status: Secondary | ICD-10-CM | POA: Diagnosis not present

## 2021-03-31 DIAGNOSIS — E7849 Other hyperlipidemia: Secondary | ICD-10-CM | POA: Diagnosis not present

## 2021-03-31 DIAGNOSIS — Z951 Presence of aortocoronary bypass graft: Secondary | ICD-10-CM | POA: Diagnosis not present

## 2021-03-31 DIAGNOSIS — Z7902 Long term (current) use of antithrombotics/antiplatelets: Secondary | ICD-10-CM | POA: Insufficient documentation

## 2021-03-31 DIAGNOSIS — Z880 Allergy status to penicillin: Secondary | ICD-10-CM | POA: Insufficient documentation

## 2021-03-31 DIAGNOSIS — I739 Peripheral vascular disease, unspecified: Secondary | ICD-10-CM

## 2021-03-31 HISTORY — PX: ABDOMINAL AORTOGRAM W/LOWER EXTREMITY: CATH118223

## 2021-03-31 HISTORY — PX: PERIPHERAL VASCULAR BALLOON ANGIOPLASTY: CATH118281

## 2021-03-31 SURGERY — ABDOMINAL AORTOGRAM W/LOWER EXTREMITY
Anesthesia: LOCAL | Laterality: Right

## 2021-03-31 MED ORDER — HEPARIN SODIUM (PORCINE) 1000 UNIT/ML IJ SOLN
INTRAMUSCULAR | Status: AC
  Filled 2021-03-31: qty 1

## 2021-03-31 MED ORDER — SODIUM CHLORIDE 0.9 % WEIGHT BASED INFUSION
3.0000 mL/kg/h | INTRAVENOUS | Status: AC
  Administered 2021-03-31: 3 mL/kg/h via INTRAVENOUS

## 2021-03-31 MED ORDER — IODIXANOL 320 MG/ML IV SOLN
INTRAVENOUS | Status: DC | PRN
  Administered 2021-03-31: 115 mL via INTRA_ARTERIAL

## 2021-03-31 MED ORDER — HEPARIN (PORCINE) IN NACL 1000-0.9 UT/500ML-% IV SOLN
INTRAVENOUS | Status: DC | PRN
  Administered 2021-03-31 (×2): 500 mL

## 2021-03-31 MED ORDER — SODIUM CHLORIDE 0.9% FLUSH
3.0000 mL | Freq: Two times a day (BID) | INTRAVENOUS | Status: DC
Start: 2021-03-31 — End: 2021-03-31

## 2021-03-31 MED ORDER — ACETAMINOPHEN 325 MG PO TABS: 650.0000 mg | ORAL_TABLET | ORAL | Status: DC | PRN

## 2021-03-31 MED ORDER — SODIUM CHLORIDE 0.9% FLUSH: 3.0000 mL | INTRAVENOUS | Status: DC | PRN

## 2021-03-31 MED ORDER — SODIUM CHLORIDE 0.9 % WEIGHT BASED INFUSION
1.0000 mL/kg/h | INTRAVENOUS | Status: DC
Start: 2021-03-31 — End: 2021-03-31

## 2021-03-31 MED ORDER — SODIUM CHLORIDE 0.9 % IV SOLN: 250.0000 mL | INTRAVENOUS | Status: DC | PRN

## 2021-03-31 MED ORDER — HYDRALAZINE HCL 20 MG/ML IJ SOLN: 10.0000 mg | INTRAMUSCULAR | Status: DC | PRN

## 2021-03-31 MED ORDER — LIDOCAINE HCL (PF) 1 % IJ SOLN
INTRAMUSCULAR | Status: AC
  Filled 2021-03-31: qty 30

## 2021-03-31 MED ORDER — HEPARIN SODIUM (PORCINE) 1000 UNIT/ML IJ SOLN
INTRAMUSCULAR | Status: DC | PRN
  Administered 2021-03-31: 1500 [IU] via INTRAVENOUS
  Administered 2021-03-31: 4000 [IU] via INTRAVENOUS

## 2021-03-31 MED ORDER — FENTANYL CITRATE (PF) 100 MCG/2ML IJ SOLN
INTRAMUSCULAR | Status: DC | PRN
  Administered 2021-03-31: 50 ug via INTRAVENOUS

## 2021-03-31 MED ORDER — LIDOCAINE HCL (PF) 1 % IJ SOLN
INTRAMUSCULAR | Status: DC | PRN
  Administered 2021-03-31: 15 mL via INTRADERMAL

## 2021-03-31 MED ORDER — MIDAZOLAM HCL 2 MG/2ML IJ SOLN
INTRAMUSCULAR | Status: DC | PRN
  Administered 2021-03-31: 1 mg via INTRAVENOUS

## 2021-03-31 MED ORDER — HEPARIN (PORCINE) IN NACL 1000-0.9 UT/500ML-% IV SOLN
INTRAVENOUS | Status: AC
  Filled 2021-03-31: qty 500

## 2021-03-31 MED ORDER — ONDANSETRON HCL 4 MG/2ML IJ SOLN: 4.0000 mg | Freq: Four times a day (QID) | INTRAMUSCULAR | Status: DC | PRN

## 2021-03-31 MED ORDER — SODIUM CHLORIDE 0.9 % IV SOLN: INTRAVENOUS | Status: DC

## 2021-03-31 MED ORDER — ASPIRIN 81 MG PO CHEW: 81.0000 mg | CHEWABLE_TABLET | ORAL | Status: DC

## 2021-03-31 MED ORDER — FENTANYL CITRATE (PF) 100 MCG/2ML IJ SOLN
INTRAMUSCULAR | Status: AC
  Filled 2021-03-31: qty 2

## 2021-03-31 MED ORDER — SODIUM CHLORIDE 0.9% FLUSH: 3.0000 mL | Freq: Two times a day (BID) | INTRAVENOUS | Status: DC

## 2021-03-31 MED ORDER — SODIUM CHLORIDE 0.9% FLUSH
3.0000 mL | INTRAVENOUS | Status: DC | PRN
Start: 2021-03-31 — End: 2021-03-31

## 2021-03-31 MED ORDER — MIDAZOLAM HCL 5 MG/5ML IJ SOLN
INTRAMUSCULAR | Status: AC
  Filled 2021-03-31: qty 5

## 2021-03-31 SURGICAL SUPPLY — 24 items
BALLN CHOCOLATE 4.0X80X135 (BALLOONS) ×3
BALLOON CHOCOLATE 4.0X80X135 (BALLOONS) ×2 IMPLANT
CATH ANGIO 5F PIGTAIL 65CM (CATHETERS) ×3 IMPLANT
CATH CROSS OVER TEMPO 5F (CATHETERS) ×3 IMPLANT
CATH SOFT-VU 4F 65 STRAIGHT (CATHETERS) ×2 IMPLANT
CATH SOFT-VU STRAIGHT 4F 65CM (CATHETERS) ×1
CLOSURE PERCLOSE PROSTYLE (VASCULAR PRODUCTS) ×6 IMPLANT
DCB RANGER 5.0X150 150 (BALLOONS) ×2 IMPLANT
KIT ENCORE 26 ADVANTAGE (KITS) ×3 IMPLANT
KIT MICROPUNCTURE NIT STIFF (SHEATH) ×3 IMPLANT
KIT PV (KITS) ×3 IMPLANT
RANGER DCB 5.0X150 150 (BALLOONS) ×3
SHEATH PINNACLE 5F 10CM (SHEATH) ×3 IMPLANT
SHEATH PINNACLE ST 6F 45CM (SHEATH) ×3 IMPLANT
SHEATH PROBE COVER 6X72 (BAG) ×3 IMPLANT
SHIELD RADPAD SCOOP 12X17 (MISCELLANEOUS) ×3 IMPLANT
STOPCOCK MORSE 400PSI 3WAY (MISCELLANEOUS) ×3 IMPLANT
SYR MEDRAD MARK 7 150ML (SYRINGE) ×3 IMPLANT
TRANSDUCER W/STOPCOCK (MISCELLANEOUS) ×3 IMPLANT
TRAY PV CATH (CUSTOM PROCEDURE TRAY) ×3 IMPLANT
TUBING CIL FLEX 10 FLL-RA (TUBING) ×3 IMPLANT
WIRE HITORQ VERSACORE ST 145CM (WIRE) ×3 IMPLANT
WIRE ROSEN-J .035X180CM (WIRE) ×3 IMPLANT
WIRE RUNTHROUGH .014X300CM (WIRE) ×3 IMPLANT

## 2021-03-31 NOTE — Interval H&P Note (Signed)
History and Physical Interval Note:  03/31/2021 8:52 AM  [name]  has presented today for surgery, with the diagnosis of pad.  The various methods of treatment have been discussed with the patient and family. After consideration of risks, benefits and other options for treatment, the patient has consented to  Procedure(s): ABDOMINAL AORTOGRAM W/LOWER EXTREMITY (N/A) as a surgical intervention.  The patient's history has been reviewed, patient examined, no change in status, stable for surgery.  I have reviewed the patient's chart and labs.  Questions were answered to the patient's satisfaction.     Katherine Hall

## 2021-03-31 NOTE — Discharge Instructions (Signed)
Femoral Site Care  This sheet gives you information about how to care for yourself after your procedure. Your health care provider may also give you more specific instructions. If you have problems or questions, contact your health care provider. What can I expect after the procedure? After the procedure, it is common to have:  Bruising that usually fades within 1-2 weeks.  Tenderness at the site. Follow these instructions at home: Wound care  Follow instructions from your health care provider about how to take care of your insertion site. Make sure you: ? Wash your hands with soap and water before you change your bandage (dressing). If soap and water are not available, use hand sanitizer. ? Change your dressing as told by your health care provider. ? Leave stitches (sutures), skin glue, or adhesive strips in place. These skin closures may need to stay in place for 2 weeks or longer. If adhesive strip edges start to loosen and curl up, you may trim the loose edges. Do not remove adhesive strips completely unless your health care provider tells you to do that.  Do not take baths, swim, or use a hot tub until your health care provider approves.  You may shower 24-48 hours after the procedure or as told by your health care provider. ? Gently wash the site with plain soap and water. ? Pat the area dry with a clean towel. ? Do not rub the site. This may cause bleeding.  Do not apply powder or lotion to the site. Keep the site clean and dry.  Check your femoral site every day for signs of infection. Check for: ? Redness, swelling, or pain. ? Fluid or blood. ? Warmth. ? Pus or a bad smell. Activity  For the first 2-3 days after your procedure, or as long as directed: ? Avoid climbing stairs as much as possible. ? Do not squat.  Do not lift anything that is heavier than 10 lb (4.5 kg), or the limit that you are told, until your health care provider says that it is safe.  Rest as  directed. ? Avoid sitting for a long time without moving. Get up to take short walks every 1-2 hours.  Do not drive for 24 hours if you were given a medicine to help you relax (sedative). General instructions  Take over-the-counter and prescription medicines only as told by your health care provider.  Keep all follow-up visits as told by your health care provider. This is important. Contact a health care provider if you have:  A fever or chills.  You have redness, swelling, or pain around your insertion site. Get help right away if:  The catheter insertion area swells very fast.  You pass out.  You suddenly start to sweat or your skin gets clammy.  The catheter insertion area is bleeding, and the bleeding does not stop when you hold steady pressure on the area.  The area near or just beyond the catheter insertion site becomes pale, cool, tingly, or numb. These symptoms may represent a serious problem that is an emergency. Do not wait to see if the symptoms will go away. Get medical help right away. Call your local emergency services (911 in the [place] Do not drive yourself to the hospital. Summary  After the procedure, it is common to have bruising that usually fades within 1-2 weeks.  Check your femoral site every day for signs of infection.  Do not lift anything that is heavier than 10 lb (4.5 kg), or   the limit that you are told, until your health care provider says that it is safe. This information is not intended to replace advice given to you by your health care provider. Make sure you discuss any questions you have with your health care provider. Document Revised: 07/24/2020 Document Reviewed: 07/24/2020 Elsevier Patient Education  2021 [employer]vier Inc.  

## 2021-04-01 ENCOUNTER — Encounter (HOSPITAL_COMMUNITY): Payer: Self-pay | Admitting: Cardiovascular Disease

## 2021-04-01 LAB — POCT ACTIVATED CLOTTING TIME: Activated Clotting Time: 208 seconds

## 2021-04-12 NOTE — Telephone Encounter (Signed)
Discussed [employer] provider portal with Alta Corning, billing manager, on 04/09/21  We were unable to complete PA process for medication in this portal as the CPT codes are not available in the [facility] portal  Will need to call [facility] to initiate PA

## 2021-04-13 ENCOUNTER — Ambulatory Visit (HOSPITAL_COMMUNITY)
Admission: RE | Admit: 2021-04-13 | Discharge: 2021-04-13 | Disposition: A | Discharge status: Home / Self Care | Payer: Medicare Other | Source: Ambulatory Visit | Attending: Internal Medicine | Admitting: Internal Medicine

## 2021-04-13 ENCOUNTER — Other Ambulatory Visit: Payer: Self-pay

## 2021-04-13 DIAGNOSIS — I739 Peripheral vascular disease, unspecified: Secondary | ICD-10-CM | POA: Diagnosis present

## 2021-04-14 ENCOUNTER — Emergency Department (HOSPITAL_BASED_OUTPATIENT_CLINIC_OR_DEPARTMENT_OTHER): Payer: Medicare Other

## 2021-04-14 ENCOUNTER — Emergency Department (HOSPITAL_BASED_OUTPATIENT_CLINIC_OR_DEPARTMENT_OTHER)
Admission: EM | Admit: 2021-04-14 | Discharge: 2021-04-14 | Disposition: A | Discharge status: Home / Self Care | Payer: Medicare Other | Attending: Emergency Medicine | Admitting: Emergency Medicine

## 2021-04-14 ENCOUNTER — Encounter (HOSPITAL_BASED_OUTPATIENT_CLINIC_OR_DEPARTMENT_OTHER): Payer: Self-pay

## 2021-04-14 ENCOUNTER — Other Ambulatory Visit: Payer: Self-pay

## 2021-04-14 DIAGNOSIS — Z7902 Long term (current) use of antithrombotics/antiplatelets: Secondary | ICD-10-CM | POA: Diagnosis not present

## 2021-04-14 DIAGNOSIS — Z20822 Contact with and (suspected) exposure to covid-19: Secondary | ICD-10-CM | POA: Insufficient documentation

## 2021-04-14 DIAGNOSIS — Z7982 Long term (current) use of aspirin: Secondary | ICD-10-CM | POA: Diagnosis not present

## 2021-04-14 DIAGNOSIS — Z951 Presence of aortocoronary bypass graft: Secondary | ICD-10-CM | POA: Diagnosis not present

## 2021-04-14 DIAGNOSIS — R55 Syncope and collapse: Secondary | ICD-10-CM | POA: Diagnosis present

## 2021-04-14 DIAGNOSIS — I739 Peripheral vascular disease, unspecified: Secondary | ICD-10-CM

## 2021-04-14 DIAGNOSIS — E039 Hypothyroidism, unspecified: Secondary | ICD-10-CM | POA: Insufficient documentation

## 2021-04-14 DIAGNOSIS — R7303 Prediabetes: Secondary | ICD-10-CM | POA: Diagnosis not present

## 2021-04-14 DIAGNOSIS — I2510 Atherosclerotic heart disease of native coronary artery without angina pectoris: Secondary | ICD-10-CM | POA: Diagnosis not present

## 2021-04-14 DIAGNOSIS — Z79899 Other long term (current) drug therapy: Secondary | ICD-10-CM | POA: Insufficient documentation

## 2021-04-14 DIAGNOSIS — Z91040 Latex allergy status: Secondary | ICD-10-CM | POA: Insufficient documentation

## 2021-04-14 DIAGNOSIS — I10 Essential (primary) hypertension: Secondary | ICD-10-CM | POA: Insufficient documentation

## 2021-04-14 LAB — CBC WITH DIFFERENTIAL/PLATELET
Abs Immature Granulocytes: 0.03 10*3/uL (ref 0.00–0.07)
Basophils Absolute: 0.1 10*3/uL (ref 0.0–0.1)
Basophils Relative: 1 %
Eosinophils Absolute: 0.2 10*3/uL (ref 0.0–0.5)
Eosinophils Relative: 3 %
HCT: 33.5 % — ABNORMAL LOW (ref 36.0–46.0)
Hemoglobin: 10.7 g/dL — ABNORMAL LOW (ref 12.0–15.0)
Immature Granulocytes: 0 %
Lymphocytes Relative: 16 %
Lymphs Abs: 1.4 10*3/uL (ref 0.7–4.0)
MCH: 31 pg (ref 26.0–34.0)
MCHC: 31.9 g/dL (ref 30.0–36.0)
MCV: 97.1 fL (ref 80.0–100.0)
Monocytes Absolute: 0.5 10*3/uL (ref 0.1–1.0)
Monocytes Relative: 5 %
Neutro Abs: 6.7 10*3/uL (ref 1.7–7.7)
Neutrophils Relative %: 75 %
Platelets: 403 10*3/uL — ABNORMAL HIGH (ref 150–400)
RBC: 3.45 MIL/uL — ABNORMAL LOW (ref 3.87–5.11)
RDW: 13.8 % (ref 11.5–15.5)
WBC: 8.9 10*3/uL (ref 4.0–10.5)
nRBC: 0 % (ref 0.0–0.2)

## 2021-04-14 LAB — URINALYSIS, ROUTINE W REFLEX MICROSCOPIC
Bilirubin Urine: NEGATIVE
Glucose, UA: NEGATIVE mg/dL
Hgb urine dipstick: NEGATIVE
Ketones, ur: NEGATIVE mg/dL
Leukocytes,Ua: NEGATIVE
Nitrite: NEGATIVE
Protein, ur: NEGATIVE mg/dL
Specific Gravity, Urine: >1.03 — ABNORMAL HIGH (ref 1.005–1.030)
pH: 5.5 (ref 5.0–8.0)

## 2021-04-14 LAB — BASIC METABOLIC PANEL
Anion gap: 9 (ref 5–15)
BUN: 19 mg/dL (ref 8–23)
CO2: 25 mmol/L (ref 22–32)
Calcium: 8.9 mg/dL (ref 8.9–10.3)
Chloride: 101 mmol/L (ref 98–111)
Creatinine, Ser: 0.83 mg/dL (ref 0.44–1.00)
GFR, Estimated: >60 mL/min (ref >60)
Glucose, Bld: 96 mg/dL (ref 70–99)
Potassium: 4.2 mmol/L (ref 3.5–5.1)
Sodium: 135 mmol/L (ref 135–145)

## 2021-04-14 LAB — HEPATIC FUNCTION PANEL
ALT: 10 U/L (ref 0–44)
AST: 17 U/L (ref 15–41)
Albumin: 3.8 g/dL (ref 3.5–5.0)
Alkaline Phosphatase: 39 U/L (ref 38–126)
Bilirubin, Direct: 0.1 mg/dL (ref 0.0–0.2)
Indirect Bilirubin: 0 mg/dL — ABNORMAL LOW (ref 0.3–0.9)
Total Bilirubin: 0.1 mg/dL — ABNORMAL LOW (ref 0.3–1.2)
Total Protein: 6.5 g/dL (ref 6.5–8.1)

## 2021-04-14 LAB — SARS CORONAVIRUS 2 (TAT 6-24 HRS): SARS Coronavirus 2: NEGATIVE

## 2021-04-14 LAB — LIPASE, BLOOD: Lipase: 83 U/L — ABNORMAL HIGH (ref 11–51)

## 2021-04-14 LAB — D-DIMER, QUANTITATIVE: D-Dimer, Quant: 0.86 ug/mL-FEU — ABNORMAL HIGH (ref 0.00–0.50)

## 2021-04-14 LAB — TROPONIN I (HIGH SENSITIVITY)
Troponin I (High Sensitivity): 9 ng/L (ref <18)
Troponin I (High Sensitivity): 8 ng/L (ref <18)

## 2021-04-14 MED ORDER — IOHEXOL 300 MG/ML  SOLN: 100.0000 mL | Freq: Once | INTRAMUSCULAR | Status: DC | PRN

## 2021-04-14 MED ORDER — IOHEXOL 350 MG/ML SOLN
100.0000 mL | Freq: Once | INTRAVENOUS | Status: AC | PRN
  Administered 2021-04-14: 100 mL via INTRAVENOUS

## 2021-04-14 NOTE — Discharge Instructions (Signed)
You were seen in the emergency department today with almost passing out twice.  Your lab work here is reassuring.  We discussed potentially staying in the hospital for further testing but with your normal work-up here I think it is also appropriate to call your PCP and cardiology teams first thing tomorrow to schedule appointments in the next week.  If you have any additional or worsening symptoms he will need to return to the emergency department immediately.  Your CT scan does show an incidental finding which I am not sure is causing your symptoms but you should discuss this with your primary care doctor.  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  Your primary care doctor can help coordinate these outpatient tests.  If you develop abdominal pain, fevers, confusion, vomiting, passing out, chest pain, or other severe symptoms you should return to the emergency department immediately and/or call 911.

## 2021-04-14 NOTE — ED Provider Notes (Signed)
[facility] Provider Note   CSN: [id number] Arrival date & time: 04/14/21  1253     History Chief Complaint  Patient presents with  . Near Syncope    Merla Mendias is a 80 y.o. female.   Near Syncope This is a new problem. The current episode started 1 to 2 hours ago. The problem occurs rarely. The problem has been resolved. Pertinent negatives include no chest pain, no headaches and no shortness of breath. Associated symptoms comments: After meal. Nothing aggravates the symptoms. Nothing relieves the symptoms. She has tried nothing for the symptoms. The treatment provided significant relief.       Past Medical History:  Diagnosis Date  . Anemia   . Arthritis   . Coronary artery disease    s/p CABG in 2017 // Myoview 11/21: No ischemia or infarction, EF > 65, low risk  // [place] >> cath 3/22: S-RPDA w severe disease - PCI unsuccessful & c/b peri-procedure MI (acute closure w wire manipulation>>inf ST elev) >> Med Rx   . Echocardiogram 02/2021    EF 65-70, no RWMA, mild LVH, Gr 1 DD, normal RVSF, RVSP 19.5, mild AI, mild AV sclerosis  . Familial hypercholesterolemia    a. h/o intolerance of statins, prev on Repatha but insurance stopped covering.  . Gastric ulcer    a. h/o bleeding ulcer 2009 requiring 3 pints of blood.  [name] History of removal of cyst    a. 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  [name] HTN (hypertension)   . Hypothyroidism   . PAD (peripheral artery disease) (HCC)    S/p L SFA PTA // ABIs/LE arterial US 4/22: R 0.7; L 0.86 // R SFA 75-99; L SFA angioplasty site 30-49, distal 50-74 // ectatic distal aorta 2.6 x 2.6 cm  . Prediabetes     Patient Active Problem List   Diagnosis Date Noted  . Urinary retention 11/09/2019  . Hematoma 11/08/2019  . Acute blood loss anemia 11/08/2019  . Statin intolerance 11/08/2019  . Tobacco abuse 11/08/2019  . Pre-diabetes 11/08/2019  . PAD (peripheral artery disease)  ([place]) 11/06/2019  . Coronary artery disease involving native coronary artery of native heart without angina pectoris 07/26/2017  . Pure hypercholesterolemia with target low density lipoprotein (LDL) cholesterol less than 70 mg/dL 12/12/2016  . 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 Anemia, unspecified 07/05/2016  . Hyponatremia 07/05/2016  . Chest pain 07/05/2016  . Pain in the chest   . Hypothyroidism 07/04/2016  . HTN (hypertension) 07/04/2016  . Hyperlipidemia 07/04/2016  . Unstable angina ([place]) 07/03/2016    Past Surgical History:  Procedure Laterality Date  . ABDOMINAL AORTOGRAM W/LOWER EXTREMITY Bilateral 11/06/2019   Procedure: ABDOMINAL AORTOGRAM W/LOWER EXTREMITY;  Surgeon: Wellington Hampshire, MD;  Location: [facility];  Service: Cardiovascular;  Laterality: Bilateral;  . ABDOMINAL AORTOGRAM W/LOWER EXTREMITY N/A 03/31/2021   Procedure: ABDOMINAL AORTOGRAM W/LOWER EXTREMITY;  Surgeon: Wellington Hampshire, MD;  Location: [facility];  Service: Cardiovascular;  Laterality: N/A;  . ABDOMINAL HYSTERECTOMY    . APPENDECTOMY    . CARDIAC CATHETERIZATION N/A 07/05/2016   Procedure: Left Heart Cath and Coronary Angiography;  Surgeon: Burnell Blanks, MD;  Location: [facility];  Service: Cardiovascular;  Laterality: N/A;  . CARDIAC SURGERY     cyst, not on heart.  . CORONARY ARTERY BYPASS GRAFT N/A 07/07/2016   Procedure: CORONARY ARTERY BYPASS GRAFTING (CABG) x 5 with  endoscopic harvesting of the right greater saphenous vein;  Surgeon: Gaye Pollack, MD;  Location: [facility];  Service: Open Heart Surgery;  Laterality: N/A;  . CORONARY BALLOON ANGIOPLASTY N/A 02/03/2021   Procedure: CORONARY BALLOON ANGIOPLASTY;  Surgeon: Wellington Hampshire, MD;  Location: [facility];  Service: Cardiovascular;  Laterality: N/A;  svg to pda  . HEMORRHOID SURGERY    . INTRAOPERATIVE TRANSESOPHAGEAL ECHOCARDIOGRAM N/A 07/07/2016   Procedure: INTRAOPERATIVE TRANSESOPHAGEAL ECHOCARDIOGRAM;   Surgeon: Gaye Pollack, MD;  Location: [facility];  Service: Open Heart Surgery;  Laterality: N/A;  . LEFT HEART CATH AND CORS/GRAFTS ANGIOGRAPHY N/A 02/03/2021   Procedure: LEFT HEART CATH AND CORS/GRAFTS ANGIOGRAPHY;  Surgeon: Wellington Hampshire, MD;  Location: [facility];  Service: Cardiovascular;  Laterality: N/A;  . PERIPHERAL VASCULAR BALLOON ANGIOPLASTY  11/06/2019   Procedure: PERIPHERAL VASCULAR BALLOON ANGIOPLASTY;  Surgeon: Wellington Hampshire, MD;  Location: [facility];  Service: Cardiovascular;;  cutting and dcb  . PERIPHERAL VASCULAR BALLOON ANGIOPLASTY Right 03/31/2021   Procedure: PERIPHERAL VASCULAR BALLOON ANGIOPLASTY;  Surgeon: Wellington Hampshire, MD;  Location: [facility];  Service: Cardiovascular;  Laterality: Right;  SFA  . SHOULDER SURGERY       OB History   No obstetric history on file.     Family History  Problem Relation Age of Onset  . Stroke Mother        Stroke age 83  . CAD Father        died of MI age 29  . Coronary artery disease Sister        one sister had massive MI at 84, another sister has 29 stents  . Coronary artery disease Brother        one brother had MI at age 10 and died at 61, another has had 4 MIs and 2 bypasses    Social History   Tobacco Use  . Smoking status: Never Smoker  . Smokeless tobacco: Never Used  Vaping Use  . Vaping Use: Never used  Substance Use Topics  . Alcohol use: No    Comment: social  . Drug use: No    Home Medications Prior to Admission medications   Medication Sig Start Date End Date Taking? Authorizing Provider  acetaminophen (TYLENOL) 650 MG CR tablet Take 1,300 mg by mouth in the morning and at bedtime.    [provider]  acyclovir (ZOVIRAX) 400 MG tablet Take 800 mg by mouth daily as needed (fever blisters).     [provider]  alendronate (FOSAMAX) 70 MG tablet Take 70 mg by mouth every Saturday.  10/16/18   [provider]  aspirin 81 MG chewable tablet Chew  1 tablet (81 mg total) by mouth daily. 02/06/21   Almyra Deforest, PA  clopidogrel (PLAVIX) 75 MG tablet Take 1 tablet (75 mg total) by mouth daily. 02/05/21   Almyra Deforest, PA  diphenhydramine-acetaminophen (TYLENOL PM) 25-500 MG TABS tablet Take 2 tablets by mouth at bedtime.    [provider]  fluticasone (FLONASE) 50 MCG/ACT nasal spray Place 2 sprays into both nostrils daily as needed for allergies.  02/22/18   [provider]  gatifloxacin (ZYMAXID) 0.5 % SOLN Place 1 drop into the left eye 2 (two) times daily. 01/13/20   [provider]  isosorbide mononitrate (IMDUR) 30 MG 24 hr tablet Take 1 tablet (30 mg total) by mouth daily. 02/06/21   Almyra Deforest, PA  levothyroxine (SYNTHROID, [place]) 50  MCG tablet Take 50 mcg by mouth daily before breakfast. 03/31/16   [provider]  metoprolol tartrate (LOPRESSOR) 25 MG tablet Take 0.5 tablets (12.5 mg total) by mouth 2 (two) times daily. 02/23/21 05/24/21  Richardson Dopp T, PA-C  nitroGLYCERIN (NITROSTAT) 0.4 MG SL tablet Place 1 tablet (0.4 mg total) under the tongue every 5 (five) minutes x 3 doses as needed for chest pain. 11/09/19   Sande Rives E, PA-C  pantoprazole (PROTONIX) 20 MG tablet Take 20 mg by mouth 2 (two) times daily.    [provider]  rOPINIRole (REQUIP) 2 MG tablet Take 2 mg by mouth at bedtime.    [provider]  traMADol (ULTRAM) 50 MG tablet Take 1 tablet (50 mg total) by mouth every 6 (six) hours as needed for severe pain. 07/13/16   Nani Skillern, PA-C    Allergies    Penicillins, Lincomycin, Naproxen sodium, Statins, Latex, and Tape  Review of Systems   Review of Systems  Constitutional: Positive for diaphoresis. Negative for chills and fever.  HENT: Negative for congestion and rhinorrhea.   Eyes: Positive for visual disturbance.  Respiratory: Negative for cough and shortness of breath.   Cardiovascular: Positive for near-syncope. Negative for chest pain and palpitations.   Gastrointestinal: Positive for nausea. Negative for diarrhea and vomiting.  Genitourinary: Negative for difficulty urinating and dysuria.  Musculoskeletal: Negative for arthralgias and back pain.  Skin: Negative for rash and wound.  Neurological: Positive for light-headedness. Negative for headaches.    Physical Exam Updated Vital Signs BP 129/68   Pulse 67   Temp 97.7 F (36.5 C)   Resp 17   SpO2 95%   Physical Exam Vitals and nursing note reviewed. Exam conducted with a chaperone present.  Constitutional:      General: She is not in acute distress.    Appearance: Normal appearance.  HENT:     Head: Normocephalic and atraumatic.     Nose: No rhinorrhea.  Eyes:     General:        Right eye: No discharge.        Left eye: No discharge.     Conjunctiva/sclera: Conjunctivae normal.  Cardiovascular:     Rate and Rhythm: Normal rate and regular rhythm.     Heart sounds: No murmur heard.   Pulmonary:     Effort: Pulmonary effort is normal. No respiratory distress.     Breath sounds: No stridor. No wheezing or rales.  Abdominal:     General: Abdomen is flat. There is no distension.     Palpations: Abdomen is soft.  Musculoskeletal:        General: No tenderness or signs of injury.     Right lower leg: No edema.     Left lower leg: No edema.  Skin:    General: Skin is warm and dry.  Neurological:     General: No focal deficit present.     Mental Status: She is alert. Mental status is at baseline.     Motor: No weakness.  Psychiatric:        Mood and Affect: Mood normal.        Behavior: Behavior normal.     ED Results / Procedures / Treatments   Labs (all labs ordered are listed, but only abnormal results are displayed) Labs Reviewed  CBC WITH DIFFERENTIAL/PLATELET - Abnormal; Notable for the following components:      Result Value   RBC 3.45 (*)  Hemoglobin 10.7 (*)    HCT 33.5 (*)    Platelets 403 (*)    All other components within normal limits   URINALYSIS, ROUTINE W REFLEX MICROSCOPIC - Abnormal; Notable for the following components:   APPearance HAZY (*)    Specific Gravity, Urine >1.030 (*)    All other components within normal limits  D-DIMER, QUANTITATIVE - Abnormal; Notable for the following components:   D-Dimer, Quant 0.86 (*)    All other components within normal limits  SARS CORONAVIRUS 2 (TAT 6-24 HRS)  BASIC METABOLIC PANEL  TROPONIN I (HIGH SENSITIVITY)    EKG EKG Interpretation  Date/Time:  Wednesday Apr 14 2021 13:11:04 EDT Ventricular Rate:  67 PR Interval:  170 QRS Duration: 84 QT Interval:  420 QTC Calculation: 443 R Axis:   68 Text Interpretation: Normal sinus rhythm Possible Lateral infarct , age undetermined Abnormal ECG Confirmed by Dewaine Conger [id number]) on 04/14/2021 2:01:47 PM   Radiology DG Chest Portable 1 View  Result Date: 04/14/2021 CLINICAL DATA:  Near syncopal episode. EXAM: PORTABLE CHEST 1 VIEW COMPARISON:  February 02, 2021 FINDINGS: Stable postsurgical changes from CABG. Cardiomediastinal silhouette is normal. Mediastinal contours appear intact. Calcific atherosclerotic disease and tortuosity of the aorta. There is no evidence of focal airspace consolidation, pleural effusion or pneumothorax. Osseous structures are without acute abnormality. Soft tissues are grossly normal. IMPRESSION: No active disease. Electronically Signed   By: Fidela Salisbury M.D.   On: 04/14/2021 14:48   VAS [place] ABI WITH/WO TBI  Result Date: 04/13/2021  LOWER EXTREMITY DOPPLER STUDY Patient Name:  YAKISHA DETORRES  Date of Exam:   04/13/2021 Medical Rec #: [id number]      Accession #:    [id number] Date of Birth: 09/11/1941       Patient Gender: F Patient Age:   6Y Exam Location:  [facility] Procedure:      VAS [place] ABI WITH/WO TBI Referring Phys: [place] --------------------------------------------------------------------------------  Indications: S/P right SFA angioplasty on 03/31/21. Patient reports improvement               since the procedure and is so glad to be able to walk again without              significant pain. High Risk Factors: Hypertension, hyperlipidemia, no history of smoking. Other Factors: S/P CABG X 5.  Vascular Interventions: S/P drug-coated balloon angioplasty to the right SFA on                         03/31/21.                         Drug-coated balloon angioplasty to the left mid SFA on                         11/06/19. Comparison Study: Previous ABIs performed 03/09/21 were 0.70 on the right and 0.86                   on the left. Performing Technologist: Mariane Masters RVT  Examination Guidelines: A complete evaluation includes at minimum, Doppler waveform signals and systolic blood pressure reading at the level of bilateral brachial, anterior tibial, and posterior tibial arteries, when vessel segments are accessible. Bilateral testing is considered an integral part of a complete examination. Photoelectric Plethysmograph (PPG) waveforms and toe systolic pressure readings are included as required and additional duplex testing  as needed. Limited examinations for reoccurring indications may be performed as noted.  ABI Findings: +---------+------------------+-----+----------+--------+ Right    Rt Pressure (mmHg)IndexWaveform  Comment  +---------+------------------+-----+----------+--------+ Brachial 110                                       +---------+------------------+-----+----------+--------+ ATA      118               1.04 biphasic           +---------+------------------+-----+----------+--------+ PTA      109               0.96 monophasic         +---------+------------------+-----+----------+--------+ PERO     115               1.02 biphasic           +---------+------------------+-----+----------+--------+ Great Toe72                0.64 Abnormal           +---------+------------------+-----+----------+--------+  +---------+------------------+-----+----------+-------+ Left     Lt Pressure (mmHg)IndexWaveform  Comment +---------+------------------+-----+----------+-------+ Brachial 113                                      +---------+------------------+-----+----------+-------+ ATA      116               1.03 biphasic          +---------+------------------+-----+----------+-------+ PTA      59                0.52 monophasic        +---------+------------------+-----+----------+-------+ PERO     114               1.01 biphasic          +---------+------------------+-----+----------+-------+ Great Toe96                0.85 Normal            +---------+------------------+-----+----------+-------+ +-------+-----------+-----------+------------+------------+ ABI/TBIToday's ABIToday's TBIPrevious ABIPrevious TBI +-------+-----------+-----------+------------+------------+ Right  1.04       0.64       0.70        0.41         +-------+-----------+-----------+------------+------------+ Left   1.03       0.85       0.86        0.84         +-------+-----------+-----------+------------+------------+ Bilateral ABIs appear increased compared to prior study on 03/09/21.  Summary: Right: Resting right ankle-brachial index is within normal range. No evidence of significant right lower extremity arterial disease. The right toe-brachial index is abnormal. Left: Resting left ankle-brachial index is within normal range. No evidence of significant left lower extremity arterial disease. The left toe-brachial index is normal.  *See table(s) above for measurements and observations. See arterial duplex report.  Suggest follow up study in 6 months. Electronically signed by Jenkins Rouge MD on 04/13/2021 at 10:58:05 AM.    Final    VAS [place] LOWER EXTREMITY ARTERIAL DUPLEX  Result Date: 04/13/2021 LOWER EXTREMITY ARTERIAL DUPLEX STUDY Patient Name:  SALEHA KALP  Date of Exam:   04/13/2021 Medical Rec #:  [id number]      Accession #:    [id number] Date of Birth: 27-Sep-1941  Patient Gender: F Patient Age:   7Y Exam Location:  [facility] Procedure:      VAS [place] LOWER EXTREMITY ARTERIAL DUPLEX Referring Phys: [id number] --------------------------------------------------------------------------------  Indications: S/P right SFA angioplasty on 03/31/21. Patient reports improvement              since the procedure, and is so glad to be able to walk again              without significant pain. High Risk Factors: Hypertension, hyperlipidemia, no history of smoking. Other Factors: S/P CABG X 5.  Vascular Interventions: S/P drug-coated balloon angioplasty to the right SFA on                         03/31/21. Drug-coated balloon angioplasty to the left mid                         SFA on 11/06/19. Current ABI:            Today's ABIs are 1.04 on the right and 1.03 on the left. Comparison Study: Previous arterial duplex on 03/09/21 showed 75-99% proximal                   right SFA stenosis with one-vessel runoff via anterior tibial                   artery and 30-49% left SFA mid stenosis, 50-74% distal left                   SFA stenosis. Performing Technologist: Mariane Masters RVT  Examination Guidelines: A complete evaluation includes B-mode imaging, spectral Doppler, color Doppler, and power Doppler as needed of all accessible portions of each vessel. Bilateral testing is considered an integral part of a complete examination. Limited examinations for reoccurring indications may be performed as noted.  +----------+--------+-----+--------+--------+--------+ RIGHT     PSV cm/sRatioStenosisWaveformComments +----------+--------+-----+--------+--------+--------+ CFA Prox  118                  biphasic         +----------+--------+-----+--------+--------+--------+ DFA       240                  biphasic         +----------+--------+-----+--------+--------+--------+ SFA Prox  121                   biphasic         +----------+--------+-----+--------+--------+--------+ SFA Mid   67                   biphasic         +----------+--------+-----+--------+--------+--------+ SFA Distal125                  biphasic         +----------+--------+-----+--------+--------+--------+ POP Prox  149                  biphasic         +----------+--------+-----+--------+--------+--------+ POP Distal41                   biphasic         +----------+--------+-----+--------+--------+--------+ TP Trunk  97                   biphasic         +----------+--------+-----+--------+--------+--------+ Mild atherosclerosis without evidence of focal  stenosis, s/p angioplasty. Right pedal acceleration time is 67 ms, which is category 1 normal/no ischemia range.   Summary: Right: Mild-moderate atherosclerosis, without evidence of stenosis s/p right SFA angioplasty. Marked improvement is noted compared to previous exam.  See table(s) above for measurements and observations. See ABI report. Suggest follow up study in 6 months. Electronically signed by Jenkins Rouge MD on 04/13/2021 at 1:10:37 PM.    Final     Procedures Procedures   Medications Ordered in ED Medications  iohexol (OMNIPAQUE) 300 MG/ML solution 100 mL ( Intravenous Canceled Entry 04/14/21 1507)  iohexol (OMNIPAQUE) 350 MG/ML injection 100 mL (100 mLs Intravenous Contrast Given 04/14/21 1500)    ED Course  I have reviewed the triage vital signs and the nursing notes.  Pertinent labs & imaging results that were available during my care of the patient were reviewed by me and considered in my medical decision making (see chart for details).    MDM Rules/Calculators/A&P                          Patient with significant history of coronary artery disease, history of recent surgical revascularization comes in today with a near syncopal event after meals.  This is the second 1 and a week.  Both were associated with having finished a  meal recently.  She denies chest pain.  She is never had chest pain with her vascular disease.  She is always had nausea and strange arm sensations.  She had nausea with this episode but no arm sensations.  EKG shows sinus rhythm with no acute ischemic change interval abnormality or arrhythmia.  She will get cardiac biomarkers screening D-dimer chest x-ray and other screening labs.  She also has developed cough and aches, possibly consistent with viral illness.  COVID test will be sent.  Kidney function stable no significant bloodline abnormalities.  D-dimer is elevated she will get a CT PE study.,  Possibly related to viral illness.  Pt care was handed off to on coming provider at 1515.  Complete history and physical and current plan have been communicated.  Please refer to their note for the remainder of ED care and ultimate disposition.  Pt seen in conjunction with Dr. Laverta Baltimore   Final Clinical Impression(s) / ED Diagnoses Final diagnoses:  Near syncope    Rx / DC Orders ED Discharge Orders    None       Breck Coons, MD 04/14/21 1516

## 2021-04-14 NOTE — ED Triage Notes (Addendum)
Pt states she had near syncopal episode 5/6 and again ~10am today while seated after eating-c/o diarrhea x 2 days-c/o sore throat as pain site-started after near syncope episode this am-also c/o cough since near syncopal episode-NAD-to triage in w/c-states [facility] was on scene but advised her they could not transport her to [place] states her husband brought her to ED

## 2021-04-14 NOTE — ED Provider Notes (Signed)
Blood pressure (!) 145/69, pulse 70, temperature 97.7 F (36.5 C), resp. rate 17, SpO2 97 %.  Assuming care from Dr. Ron Parker.  In short, Katherine Hall is a 80 y.o. female with a chief complaint of Near Syncope .  Refer to the original H&P for additional details.  The current plan of care is to f/u on CTA.  03:50 PM  CT read from radiology reviewed.  There is no evidence of pulmonary embolism.  She does have a hiatal hernia which question if this could be causing some of her symptoms.  There is a right lung nodule that does not require follow-up.  She has some pancreatic duct dilation which was not well visualized.  Radiology is recommending pancreatic protocol CT versus abdominal MRI.  I do not have these imaging modalities in the emergency department.  She is nontender in this area.  I have added on LFTs and lipase.  After discussion with the prior EDP the patient would prefer to go home and follow with her cardiology and primary care teams for her symptoms.  We discussed that should also discussed these CT findings with her PCP who can order the appropriate tests as an outpatient.   04:58 PM  Second troponin, LFTs, lipase are within normal limits.  COVID swab has been sent and will result in the next 24 hours.  Patient to follow the test results in the MyChart app.  We had a discussion regarding strict ED return precautions.  We discussed the CT findings specifically regarding the dilated pancreatic duct and radiology imaging recommendations.  She will discuss with her PCP who will order this as an outpatient. Info provided in the AVS regarding this follow up recommendation as well. She is completely nontender in this area.  She is not having abdominal pain, vomiting, or other symptoms to prompt emergent imaging.    Margette Fast, MD 04/14/21 1700

## 2021-04-26 ENCOUNTER — Telehealth: Payer: Self-pay | Admitting: *Deleted

## 2021-04-26 NOTE — Telephone Encounter (Signed)
Completed the End of Study Visit for the CLEAR research study via telephone call. Reviewed the subject's concomitant medications and updated any changes if applicable. There are no new AE's or SAE's to report to sponsor at this time. Reviewed the changes with the most recent informed consent ([place] Version 8.1) and subject agreed. Subject has been followed by telephone calls only, so they are not on IP. The post End of Study call will be in approximately 30 days from today.

## 2021-04-27 ENCOUNTER — Ambulatory Visit: Payer: Medicare Other | Admitting: Cardiovascular Disease

## 2021-05-06 ENCOUNTER — Other Ambulatory Visit: Payer: Self-pay

## 2021-05-06 ENCOUNTER — Observation Stay (HOSPITAL_BASED_OUTPATIENT_CLINIC_OR_DEPARTMENT_OTHER)
Admission: EM | Admit: 2021-05-06 | Discharge: 2021-05-08 | Disposition: A | Discharge status: Home / Self Care | Payer: Medicare Other | Attending: Internal Medicine | Admitting: Internal Medicine

## 2021-05-06 ENCOUNTER — Encounter (HOSPITAL_BASED_OUTPATIENT_CLINIC_OR_DEPARTMENT_OTHER): Payer: Self-pay

## 2021-05-06 DIAGNOSIS — R7303 Prediabetes: Secondary | ICD-10-CM | POA: Insufficient documentation

## 2021-05-06 DIAGNOSIS — Z79899 Other long term (current) drug therapy: Secondary | ICD-10-CM | POA: Insufficient documentation

## 2021-05-06 DIAGNOSIS — R531 Weakness: Secondary | ICD-10-CM | POA: Diagnosis present

## 2021-05-06 DIAGNOSIS — D62 Acute posthemorrhagic anemia: Secondary | ICD-10-CM | POA: Insufficient documentation

## 2021-05-06 DIAGNOSIS — K3189 Other diseases of stomach and duodenum: Secondary | ICD-10-CM | POA: Insufficient documentation

## 2021-05-06 DIAGNOSIS — Z91040 Latex allergy status: Secondary | ICD-10-CM | POA: Insufficient documentation

## 2021-05-06 DIAGNOSIS — U071 COVID-19: Secondary | ICD-10-CM | POA: Insufficient documentation

## 2021-05-06 DIAGNOSIS — I2510 Atherosclerotic heart disease of native coronary artery without angina pectoris: Secondary | ICD-10-CM

## 2021-05-06 DIAGNOSIS — E039 Hypothyroidism, unspecified: Secondary | ICD-10-CM | POA: Diagnosis not present

## 2021-05-06 DIAGNOSIS — K269 Duodenal ulcer, unspecified as acute or chronic, without hemorrhage or perforation: Principal | ICD-10-CM | POA: Insufficient documentation

## 2021-05-06 DIAGNOSIS — I10 Essential (primary) hypertension: Secondary | ICD-10-CM | POA: Insufficient documentation

## 2021-05-06 DIAGNOSIS — Z7982 Long term (current) use of aspirin: Secondary | ICD-10-CM | POA: Diagnosis not present

## 2021-05-06 DIAGNOSIS — K922 Gastrointestinal hemorrhage, unspecified: Secondary | ICD-10-CM | POA: Diagnosis present

## 2021-05-06 DIAGNOSIS — I739 Peripheral vascular disease, unspecified: Secondary | ICD-10-CM | POA: Diagnosis not present

## 2021-05-06 DIAGNOSIS — K449 Diaphragmatic hernia without obstruction or gangrene: Secondary | ICD-10-CM | POA: Insufficient documentation

## 2021-05-06 DIAGNOSIS — I25119 Atherosclerotic heart disease of native coronary artery with unspecified angina pectoris: Secondary | ICD-10-CM | POA: Diagnosis present

## 2021-05-06 DIAGNOSIS — Z7902 Long term (current) use of antithrombotics/antiplatelets: Secondary | ICD-10-CM | POA: Diagnosis not present

## 2021-05-06 DIAGNOSIS — Z951 Presence of aortocoronary bypass graft: Secondary | ICD-10-CM | POA: Insufficient documentation

## 2021-05-06 DIAGNOSIS — K921 Melena: Secondary | ICD-10-CM

## 2021-05-06 LAB — CBC WITH DIFFERENTIAL/PLATELET
Abs Immature Granulocytes: 0.02 10*3/uL (ref 0.00–0.07)
Basophils Absolute: 0.1 10*3/uL (ref 0.0–0.1)
Basophils Relative: 1 %
Eosinophils Absolute: 0.5 10*3/uL (ref 0.0–0.5)
Eosinophils Relative: 8 %
HCT: 26.2 % — ABNORMAL LOW (ref 36.0–46.0)
Hemoglobin: 8.2 g/dL — ABNORMAL LOW (ref 12.0–15.0)
Immature Granulocytes: 0 %
Lymphocytes Relative: 26 %
Lymphs Abs: 1.6 10*3/uL (ref 0.7–4.0)
MCH: 29.4 pg (ref 26.0–34.0)
MCHC: 31.3 g/dL (ref 30.0–36.0)
MCV: 93.9 fL (ref 80.0–100.0)
Monocytes Absolute: 0.5 10*3/uL (ref 0.1–1.0)
Monocytes Relative: 8 %
Neutro Abs: 3.5 10*3/uL (ref 1.7–7.7)
Neutrophils Relative %: 57 %
Platelets: 490 10*3/uL — ABNORMAL HIGH (ref 150–400)
RBC: 2.79 MIL/uL — ABNORMAL LOW (ref 3.87–5.11)
RDW: 13.6 % (ref 11.5–15.5)
WBC: 6.2 10*3/uL (ref 4.0–10.5)
nRBC: 0 % (ref 0.0–0.2)

## 2021-05-06 LAB — COMPREHENSIVE METABOLIC PANEL
ALT: 8 U/L (ref 0–44)
AST: 14 U/L — ABNORMAL LOW (ref 15–41)
Albumin: 3.7 g/dL (ref 3.5–5.0)
Alkaline Phosphatase: 36 U/L — ABNORMAL LOW (ref 38–126)
Anion gap: 8 (ref 5–15)
BUN: 16 mg/dL (ref 8–23)
CO2: 24 mmol/L (ref 22–32)
Calcium: 8.8 mg/dL — ABNORMAL LOW (ref 8.9–10.3)
Chloride: 105 mmol/L (ref 98–111)
Creatinine, Ser: 0.98 mg/dL (ref 0.44–1.00)
GFR, Estimated: 58 mL/min — ABNORMAL LOW (ref >60)
Glucose, Bld: 112 mg/dL — ABNORMAL HIGH (ref 70–99)
Potassium: 4.2 mmol/L (ref 3.5–5.1)
Sodium: 137 mmol/L (ref 135–145)
Total Bilirubin: 0.3 mg/dL (ref 0.3–1.2)
Total Protein: 6.4 g/dL — ABNORMAL LOW (ref 6.5–8.1)

## 2021-05-06 LAB — PREPARE RBC (CROSSMATCH)

## 2021-05-06 LAB — RESP PANEL BY RT-PCR (FLU A&B, COVID) ARPGX2
Influenza A by PCR: NEGATIVE
Influenza B by PCR: NEGATIVE
SARS Coronavirus 2 by RT PCR: POSITIVE — AB

## 2021-05-06 LAB — OCCULT BLOOD X 1 CARD TO LAB, STOOL: Fecal Occult Bld: POSITIVE — AB

## 2021-05-06 LAB — HEMOGLOBIN AND HEMATOCRIT, BLOOD
HCT: 23.6 % — ABNORMAL LOW (ref 36.0–46.0)
Hemoglobin: 7.4 g/dL — ABNORMAL LOW (ref 12.0–15.0)

## 2021-05-06 MED ORDER — ISOSORBIDE MONONITRATE ER 30 MG PO TB24
30.0000 mg | ORAL_TABLET | Freq: Every day | ORAL | Status: DC
  Administered 2021-05-07 – 2021-05-08 (×2): 30 mg via ORAL
  Filled 2021-05-06 (×2): qty 1

## 2021-05-06 MED ORDER — ONDANSETRON HCL 4 MG/2ML IJ SOLN: 4.0000 mg | Freq: Four times a day (QID) | INTRAMUSCULAR | Status: DC | PRN

## 2021-05-06 MED ORDER — PANTOPRAZOLE SODIUM 40 MG IV SOLR
40.0000 mg | Freq: Two times a day (BID) | INTRAVENOUS | Status: DC
  Administered 2021-05-06 – 2021-05-08 (×5): 40 mg via INTRAVENOUS
  Filled 2021-05-06 (×5): qty 40

## 2021-05-06 MED ORDER — SODIUM CHLORIDE 0.9% IV SOLUTION: Freq: Once | INTRAVENOUS | Status: AC

## 2021-05-06 MED ORDER — TRAMADOL HCL 50 MG PO TABS
50.0000 mg | ORAL_TABLET | Freq: Two times a day (BID) | ORAL | Status: DC | PRN
  Administered 2021-05-07: 50 mg via ORAL
  Filled 2021-05-06 (×2): qty 1

## 2021-05-06 MED ORDER — METOPROLOL TARTRATE 25 MG PO TABS
12.5000 mg | ORAL_TABLET | Freq: Two times a day (BID) | ORAL | Status: DC
  Administered 2021-05-06 – 2021-05-08 (×3): 12.5 mg via ORAL
  Filled 2021-05-06 (×4): qty 1

## 2021-05-06 MED ORDER — PANTOPRAZOLE SODIUM 40 MG IV SOLR: 40.0000 mg | Freq: Every day | INTRAVENOUS | Status: DC

## 2021-05-06 MED ORDER — LEVOTHYROXINE SODIUM 50 MCG PO TABS
50.0000 ug | ORAL_TABLET | Freq: Every day | ORAL | Status: DC
  Administered 2021-05-07 – 2021-05-08 (×2): 50 ug via ORAL
  Filled 2021-05-06 (×2): qty 1

## 2021-05-06 MED ORDER — ONDANSETRON HCL 4 MG PO TABS: 4.0000 mg | ORAL_TABLET | Freq: Four times a day (QID) | ORAL | Status: DC | PRN

## 2021-05-06 MED ORDER — ACETAMINOPHEN 650 MG RE SUPP: 650.0000 mg | Freq: Four times a day (QID) | RECTAL | Status: DC | PRN

## 2021-05-06 MED ORDER — ACETAMINOPHEN 325 MG PO TABS
650.0000 mg | ORAL_TABLET | Freq: Four times a day (QID) | ORAL | Status: DC | PRN
  Administered 2021-05-07 (×2): 650 mg via ORAL
  Filled 2021-05-06 (×2): qty 2

## 2021-05-06 MED ORDER — ROPINIROLE HCL 1 MG PO TABS
2.0000 mg | ORAL_TABLET | Freq: Every day | ORAL | Status: DC
  Administered 2021-05-06 – 2021-05-07 (×2): 2 mg via ORAL
  Filled 2021-05-06 (×2): qty 2

## 2021-05-06 MED ORDER — NITROGLYCERIN 0.4 MG SL SUBL: 0.4000 mg | SUBLINGUAL_TABLET | SUBLINGUAL | Status: DC | PRN

## 2021-05-06 NOTE — ED Notes (Signed)
ED Provider at bedside. 

## 2021-05-06 NOTE — ED Notes (Signed)
Report given to [facility] and Sharyn Lull.

## 2021-05-06 NOTE — ED Triage Notes (Signed)
Pt states recently in hospital for surgery right leg, today arrives with weakness, dizziness.  Called by pmd and told Hgb 7.4.  Pt to follow up with cardiologist in July,  Recently recovered from [facility]

## 2021-05-06 NOTE — ED Provider Notes (Addendum)
[facility] Provider Note   CSN: [id number] Arrival date & time: 05/06/21  1045     History Chief Complaint  Patient presents with  . Weakness   Katherine Hall is a 80 y.o. female with hx of CAD s/p CABG in 2017, HLD, HTN, hypothyroidism, pre-diabetes who presents with fatigue and dark stools for the past month. Saw her PCP yesterday and had hemoglobin of 7.4, down from 10.7 on 5/11. Associated nausea, no vomiting or abdominal pain. No excessive NSAID use. She reports prior GI bleed in the 1990s, she is unsure if she required transfusion or hospitalization for this but states she was taken off aspirin at that time. She believes this was due to an ulcer but unsure where, she is on protonix. Aspirin was restarted in the past few years for her CAD s/p CABG in 2017. In March had cardiac cath which found 90% stenosis of SVG to right PDA, angioplasty attempt was unsuccessful. She has been treated with medical therapy for this. She is on ASA and Plavix. She reports last colonoscopy was within the past 10 years by Dr Kenton Kingfisher with [facility]. She is unsure what the results were. Relevant medications include Lopressor, Imdur, ASA, Plavix, levothyroxine, Tramadol. She had recent upper respiratory infection with cough, chest pain when coughing, fever, chills. These symptoms are improving.    Past Medical History:  Diagnosis Date  . Anemia   . Arthritis   . Coronary artery disease    s/p CABG in 2017 // Myoview 11/21: No ischemia or infarction, EF > 65, low risk  // [place] >> cath 3/22: S-RPDA w severe disease - PCI unsuccessful & c/b peri-procedure MI (acute closure w wire manipulation>>inf ST elev) >> Med Rx   . Echocardiogram 02/2021    EF 65-70, no RWMA, mild LVH, Gr 1 DD, normal RVSF, RVSP 19.5, mild AI, mild AV sclerosis  . Familial hypercholesterolemia    a. h/o intolerance of statins, prev on Repatha but insurance stopped covering.  .  Gastric ulcer    a. h/o bleeding ulcer 2009 requiring 3 pints of blood.  [name] History of removal of cyst    a. 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  [name] HTN (hypertension)   . Hypothyroidism   . PAD (peripheral artery disease) (HCC)    S/p L SFA PTA // ABIs/LE arterial US 4/22: R 0.7; L 0.86 // R SFA 75-99; L SFA angioplasty site 30-49, distal 50-74 // ectatic distal aorta 2.6 x 2.6 cm  . Prediabetes     Patient Active Problem List   Diagnosis Date Noted  . Urinary retention 11/09/2019  . Hematoma 11/08/2019  . Acute blood loss anemia 11/08/2019  . Statin intolerance 11/08/2019  . Tobacco abuse 11/08/2019  . Pre-diabetes 11/08/2019  . PAD (peripheral artery disease) ([place]) 11/06/2019  . Coronary artery disease involving native coronary artery of native heart without angina pectoris 07/26/2017  . Pure hypercholesterolemia with target low density lipoprotein (LDL) cholesterol less than 70 mg/dL 12/12/2016  . 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 Anemia, unspecified 07/05/2016  . Hyponatremia 07/05/2016  . Chest pain 07/05/2016  . Pain in the chest   . Hypothyroidism 07/04/2016  . HTN (hypertension) 07/04/2016  . Hyperlipidemia 07/04/2016  . Unstable angina ([place]) 07/03/2016    Past Surgical History:  Procedure Laterality Date  . ABDOMINAL AORTOGRAM W/LOWER EXTREMITY Bilateral 11/06/2019   Procedure: ABDOMINAL AORTOGRAM W/LOWER EXTREMITY;  Surgeon: Fletcher Anon,  Mertie Clause, MD;  Location: [facility];  Service: Cardiovascular;  Laterality: Bilateral;  . ABDOMINAL AORTOGRAM W/LOWER EXTREMITY N/A 03/31/2021   Procedure: ABDOMINAL AORTOGRAM W/LOWER EXTREMITY;  Surgeon: Wellington Hampshire, MD;  Location: [facility];  Service: Cardiovascular;  Laterality: N/A;  . ABDOMINAL HYSTERECTOMY    . APPENDECTOMY    . CARDIAC CATHETERIZATION N/A 07/05/2016   Procedure: Left Heart Cath and Coronary Angiography;  Surgeon: Burnell Blanks, MD;  Location: [facility];  Service: Cardiovascular;  Laterality: N/A;  . CARDIAC SURGERY     cyst, not on heart.  . CORONARY ARTERY BYPASS GRAFT N/A 07/07/2016   Procedure: CORONARY ARTERY BYPASS GRAFTING (CABG) x 5 with endoscopic harvesting of the right greater saphenous vein;  Surgeon: Gaye Pollack, MD;  Location: [facility];  Service: Open Heart Surgery;  Laterality: N/A;  . CORONARY BALLOON ANGIOPLASTY N/A 02/03/2021   Procedure: CORONARY BALLOON ANGIOPLASTY;  Surgeon: Wellington Hampshire, MD;  Location: [facility];  Service: Cardiovascular;  Laterality: N/A;  svg to pda  . HEMORRHOID SURGERY    . INTRAOPERATIVE TRANSESOPHAGEAL ECHOCARDIOGRAM N/A 07/07/2016   Procedure: INTRAOPERATIVE TRANSESOPHAGEAL ECHOCARDIOGRAM;  Surgeon: Gaye Pollack, MD;  Location: [facility] OR;  Service: Open Heart Surgery;  Laterality: N/A;  . LEFT HEART CATH AND CORS/GRAFTS ANGIOGRAPHY N/A 02/03/2021   Procedure: LEFT HEART CATH AND CORS/GRAFTS ANGIOGRAPHY;  Surgeon: Wellington Hampshire, MD;  Location: [facility];  Service: Cardiovascular;  Laterality: N/A;  . PERIPHERAL VASCULAR BALLOON ANGIOPLASTY  11/06/2019   Procedure: PERIPHERAL VASCULAR BALLOON ANGIOPLASTY;  Surgeon: Wellington Hampshire, MD;  Location: [facility];  Service: Cardiovascular;;  cutting and dcb  . PERIPHERAL VASCULAR BALLOON ANGIOPLASTY Right 03/31/2021   Procedure: PERIPHERAL VASCULAR BALLOON ANGIOPLASTY;  Surgeon: Wellington Hampshire, MD;  Location: [facility];  Service: Cardiovascular;  Laterality: Right;  SFA  . SHOULDER SURGERY       OB History   No obstetric history on file.     Family History  Problem Relation Age of Onset  . Stroke Mother        Stroke age 71  . CAD Father        died of MI age 70  . Coronary artery disease Sister        one sister had massive MI at 28, another sister has 28 stents  . Coronary artery disease Brother        one brother had MI at age 62 and died at 45, another has had 4 MIs and 2 bypasses    Social History    Tobacco Use  . Smoking status: Never Smoker  . Smokeless tobacco: Never Used  Vaping Use  . Vaping Use: Never used  Substance Use Topics  . Alcohol use: No    Comment: social  . Drug use: No    Home Medications Prior to Admission medications   Medication Sig Start Date End Date Taking? Authorizing Provider  acetaminophen (TYLENOL) 650 MG CR tablet Take 1,300 mg by mouth in the morning and at bedtime.    [provider]  acyclovir (ZOVIRAX) 400 MG tablet Take 800 mg by mouth daily as needed (fever blisters).     [provider]  alendronate (FOSAMAX) 70 MG tablet Take 70 mg by mouth every Saturday.  10/16/18   [provider]  aspirin 81 MG chewable tablet Chew 1 tablet (81 mg total) by mouth daily. 02/06/21  Almyra Deforest, [place]  clopidogrel (PLAVIX) 75 MG tablet Take 1 tablet (75 mg total) by mouth daily. 02/05/21   Almyra Deforest, PA  diphenhydramine-acetaminophen (TYLENOL PM) 25-500 MG TABS tablet Take 2 tablets by mouth at bedtime.    [provider]  fluticasone (FLONASE) 50 MCG/ACT nasal spray Place 2 sprays into both nostrils daily as needed for allergies.  02/22/18   [provider]  gatifloxacin (ZYMAXID) 0.5 % SOLN Place 1 drop into the left eye 2 (two) times daily. 01/13/20   [provider]  isosorbide mononitrate (IMDUR) 30 MG 24 hr tablet Take 1 tablet (30 mg total) by mouth daily. 02/06/21   Almyra Deforest, PA  levothyroxine (SYNTHROID, LEVOTHROID) 50 MCG tablet Take 50 mcg by mouth daily before breakfast. 03/31/16   [provider]  metoprolol tartrate (LOPRESSOR) 25 MG tablet Take 0.5 tablets (12.5 mg total) by mouth 2 (two) times daily. 02/23/21 05/24/21  Richardson Dopp T, PA-C  nitroGLYCERIN (NITROSTAT) 0.4 MG SL tablet Place 1 tablet (0.4 mg total) under the tongue every 5 (five) minutes x 3 doses as needed for chest pain. 11/09/19   Sande Rives E, PA-C  pantoprazole (PROTONIX) 20 MG tablet Take 20 mg by mouth 2 (two) times  daily.    [provider]  rOPINIRole (REQUIP) 2 MG tablet Take 2 mg by mouth at bedtime.    [provider]  traMADol (ULTRAM) 50 MG tablet Take 1 tablet (50 mg total) by mouth every 6 (six) hours as needed for severe pain. 07/13/16   Nani Skillern, PA-C    Allergies    Penicillins, Lincomycin, Naproxen sodium, Statins, Latex, and Tape  Review of Systems   Review of Systems  Constitutional: Positive for chills, fatigue and fever.  Respiratory: Positive for cough. Negative for shortness of breath.   Cardiovascular: Positive for chest pain. Negative for palpitations.  Gastrointestinal: Positive for nausea. Negative for abdominal pain, diarrhea and vomiting.       Dark stools  All other systems reviewed and are negative.   Physical Exam Updated Vital Signs BP (!) 104/56   Pulse 60   Temp 98.9 F (37.2 C) (Oral)   Resp 14   Ht 4\' 8"  (1.422 m)   Wt 56.2 kg   SpO2 100%   BMI 27.80 kg/m   Physical Exam Vitals and nursing note reviewed.  Constitutional:      General: She is not in acute distress.    Appearance: Normal appearance. She is not ill-appearing.  HENT:     Head: Normocephalic and atraumatic.     Right Ear: External ear normal.     Left Ear: External ear normal.     Nose: Nose normal.     Mouth/Throat:     Mouth: Mucous membranes are moist.  Eyes:     Extraocular Movements: Extraocular movements intact.  Cardiovascular:     Rate and Rhythm: Normal rate and regular rhythm.     Pulses: Normal pulses.     Heart sounds: Normal heart sounds. No murmur heard. No friction rub. No gallop.      Comments: Trace pitting edema, L > R Pulmonary:     Effort: Pulmonary effort is normal. No respiratory distress.     Breath sounds: Normal breath sounds. No wheezing, rhonchi or rales.  Abdominal:     General: Abdomen is flat. Bowel sounds are normal. There is no distension.     Palpations: Abdomen is soft.     Tenderness: There is  no abdominal  tenderness. There is no guarding or rebound.  Genitourinary:    Rectum: Normal. Guaiac result positive.     Comments: Dark brown stool in rectal vault, no gross blood Musculoskeletal:        General: No swelling or deformity. Normal range of motion.     Cervical back: Normal range of motion and neck supple.  Skin:    General: Skin is warm and dry.  Neurological:     General: No focal deficit present.     Mental Status: She is alert and oriented to person, place, and time.  Psychiatric:        Mood and Affect: Mood normal.        Behavior: Behavior normal.     ED Results / Procedures / Treatments   Labs (all labs ordered are listed, but only abnormal results are displayed) Labs Reviewed  OCCULT BLOOD X 1 CARD TO LAB, STOOL - Abnormal; Notable for the following components:      Result Value   Fecal Occult Bld POSITIVE (*)    All other components within normal limits  COMPREHENSIVE METABOLIC PANEL - Abnormal; Notable for the following components:   Glucose, Bld 112 (*)    Calcium 8.8 (*)    Total Protein 6.4 (*)    AST 14 (*)    Alkaline Phosphatase 36 (*)    GFR, Estimated 58 (*)    All other components within normal limits  CBC WITH DIFFERENTIAL/PLATELET - Abnormal; Notable for the following components:   RBC 2.79 (*)    Hemoglobin 8.2 (*)    HCT 26.2 (*)    Platelets 490 (*)    All other components within normal limits  RESP PANEL BY RT-PCR (FLU A&B, COVID) [id number]    EKG EKG Interpretation  Date/Time:  Thursday May 06 2021 11:14:58 EDT Ventricular Rate:  61 PR Interval:  171 QRS Duration: 93 QT Interval:  437 QTC Calculation: 441 R Axis:   42 Text Interpretation: Sinus rhythm Abnormal inferior Q waves No significant change since last tracing Confirmed by Theotis Burrow [phone]) on 05/06/2021 12:35:40 PM   Radiology No results found.  Procedures Procedures   Medications Ordered in ED Medications - No data to display  ED Course  I have reviewed the triage  vital signs and the nursing notes.  Pertinent labs & imaging results that were available during my care of the patient were reviewed by me and considered in my medical decision making (see chart for details).    MDM Rules/Calculators/A&P                          80 y/o F who takes aspirin and Plavix for CAD presents with fatigue, dark stools, and 3.5 point drop in hemoglobin over past month. Hemodynamically stable. Has history of prior GI bleed in the 1990s. Patient is unsure what last colonoscopy results were, but reports this was within the past 10 years at [facility]. She is overall fairly well appearing on exam, no abdominal tenderness, rectal exam without fissures or significant hemorrhoids. Hemoccult positive. Blood loss most likely due to slow GI bleed in setting of DAPT.   Hgb of 8.2. She does not require blood transfusion at this time. CMP largely unremarkable. Patient agreeable to admission. Spoke with hospitalist at [facility] who will admit the patient. Started Protonix. Spoke with GI who will see patient at [facility] note, patient had  positive COVID screening test for admission. She had another prior positive test on 5/19 so she is out of the isolation period.  Final Clinical Impression(s) / ED Diagnoses Final diagnoses:  Gastrointestinal hemorrhage, unspecified gastrointestinal hemorrhage type    Rx / DC Orders ED Discharge Orders    None       Andrew Au, MD 05/06/21 1244    Andrew Au, MD 05/06/21 1412    Andrew Au, MD 05/06/21 1421    Little, Wenda Overland, MD 05/09/21 [phone]

## 2021-05-06 NOTE — H&P (Signed)
History and Physical    [name] PIR:[id number] DOB: 1940-12-10 DOA: 05/06/2021  PCP: Stacie Glaze, DO   Patient coming from: Home   Chief Complaint: Lightheadedness, fatigue, melena   HPI: Katherine Hall is a 80 y.o. female with medical history significant for CAD status post CABG in 2017, hypothyroidism, remote PUD, and PAD s/p right SFA angioplasty on 04/02/2021, now presenting to the emergency department with lightheadedness, near syncope, dark stool, and hemoglobin 7.4 on outpatient blood work, down from 10.7 three weeks ago.  Patient reports worsening fatigue and lightheadedness over the past couple weeks and reports that her stool has been black for roughly 3 weeks.  She denies abdominal pain but has had some nausea.  She denies any NSAID use but has been taking aspirin and Plavix daily since the right SFA angioplasty in April.  She also takes Protonix every day.  She has occasional chest pains that has been stable, no longer experiencing right leg pain since angioplasty, but has some claudication on the left.  She reports recently recovering from shingles involving her right trunk, has some scars, but no vesicles currently.  [facility] ED Course: Upon arrival to the ED, patient is found to be afebrile, saturating well on room air, and with stable blood pressure.  EKG features sinus rhythm.  Chemistry panel unremarkable.  CBC notable for hemoglobin of 8.2, down from 10.7 three weeks ago.  Fecal occult blood testing is positive.  Gastroenterology was consulted by the ED physician and hospitalist at [facility] excepted the patient for transfer.  Review of Systems:  All other systems reviewed and apart from HPI, are negative.  Past Medical History:  Diagnosis Date  . Anemia   . Arthritis   . Coronary artery disease    s/p CABG in 2017 // Myoview 11/21: No ischemia or infarction, EF > 65, low risk  // [place] >> cath 3/22: S-RPDA w severe disease - PCI unsuccessful & c/b  peri-procedure MI (acute closure w wire manipulation>>inf ST elev) >> Med Rx   . Echocardiogram 02/2021    EF 65-70, no RWMA, mild LVH, Gr 1 DD, normal RVSF, RVSP 19.5, mild AI, mild AV sclerosis  . Familial hypercholesterolemia    a. h/o intolerance of statins, prev on Repatha but insurance stopped covering.  . Gastric ulcer    a. h/o bleeding ulcer 2009 requiring 3 pints of blood.  [name] History of removal of cyst    a. 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  [name] HTN (hypertension)   . Hypothyroidism   . PAD (peripheral artery disease) (HCC)    S/p L SFA PTA // ABIs/LE arterial US 4/22: R 0.7; L 0.86 // R SFA 75-99; L SFA angioplasty site 30-49, distal 50-74 // ectatic distal aorta 2.6 x 2.6 cm  . Prediabetes     Past Surgical History:  Procedure Laterality Date  . ABDOMINAL AORTOGRAM W/LOWER EXTREMITY Bilateral 11/06/2019   Procedure: ABDOMINAL AORTOGRAM W/LOWER EXTREMITY;  Surgeon: Wellington Hampshire, MD;  Location: [place] [facility];  Service: Cardiovascular;  Laterality: Bilateral;  . ABDOMINAL AORTOGRAM W/LOWER EXTREMITY N/A 03/31/2021   Procedure: ABDOMINAL AORTOGRAM W/LOWER EXTREMITY;  Surgeon: Wellington Hampshire, MD;  Location: [place] [facility];  Service: Cardiovascular;  Laterality: N/A;  . ABDOMINAL HYSTERECTOMY    . APPENDECTOMY    . CARDIAC CATHETERIZATION N/A 07/05/2016   Procedure: Left Heart Cath and Coronary Angiography;  Surgeon: Burnell Blanks, MD;  Location: [place] [facility];  Service: Cardiovascular;  Laterality: N/A;  . CARDIAC SURGERY     cyst, not on heart.  . CORONARY ARTERY BYPASS GRAFT N/A 07/07/2016   Procedure: CORONARY ARTERY BYPASS GRAFTING (CABG) x 5 with endoscopic harvesting of the right greater saphenous vein;  Surgeon: Gaye Pollack, MD;  Location: [place] [facility];  Service: Open Heart Surgery;  Laterality: N/A;  . CORONARY BALLOON ANGIOPLASTY N/A 02/03/2021   Procedure: CORONARY BALLOON ANGIOPLASTY;  Surgeon: Wellington Hampshire, MD;   Location: [place] [facility];  Service: Cardiovascular;  Laterality: N/A;  svg to pda  . HEMORRHOID SURGERY    . INTRAOPERATIVE TRANSESOPHAGEAL ECHOCARDIOGRAM N/A 07/07/2016   Procedure: INTRAOPERATIVE TRANSESOPHAGEAL ECHOCARDIOGRAM;  Surgeon: Gaye Pollack, MD;  Location: [facility];  Service: Open Heart Surgery;  Laterality: N/A;  . LEFT HEART CATH AND CORS/GRAFTS ANGIOGRAPHY N/A 02/03/2021   Procedure: LEFT HEART CATH AND CORS/GRAFTS ANGIOGRAPHY;  Surgeon: Wellington Hampshire, MD;  Location: [place] [facility];  Service: Cardiovascular;  Laterality: N/A;  . PERIPHERAL VASCULAR BALLOON ANGIOPLASTY  11/06/2019   Procedure: PERIPHERAL VASCULAR BALLOON ANGIOPLASTY;  Surgeon: Wellington Hampshire, MD;  Location: [place] [facility];  Service: Cardiovascular;;  cutting and dcb  . PERIPHERAL VASCULAR BALLOON ANGIOPLASTY Right 03/31/2021   Procedure: PERIPHERAL VASCULAR BALLOON ANGIOPLASTY;  Surgeon: Wellington Hampshire, MD;  Location: [place] [facility];  Service: Cardiovascular;  Laterality: Right;  SFA  . SHOULDER SURGERY      Social History:   reports that she has never smoked. She has never used smokeless tobacco. She reports that she does not drink alcohol and does not use drugs.  Allergies  Allergen Reactions  . Penicillins Anaphylaxis    Did it involve swelling of the face/tongue/throat, SOB, or low BP? Yes Did it involve sudden or severe rash/hives, skin peeling, or any reaction on the inside of your mouth or nose? No Did you need to seek medical attention at a hospital or doctor's office? Yes When did it last happen?10+ years If all above answers are "NO", may proceed with cephalosporin use.    . Lincomycin Swelling and Other (See Comments)    passed out  . Naproxen Sodium Other (See Comments)    Stomach ulcers  . Statins Other (See Comments)    Myalgias  . Latex Other (See Comments)    Blisters when in contact with skin  . Tape     blistering    Family History  Problem Relation Age  of Onset  . Stroke Mother        Stroke age 4  . CAD Father        died of MI age 80  . Coronary artery disease Sister        one sister had massive MI at 40, another sister has 33 stents  . Coronary artery disease Brother        one brother had MI at age 3 and died at 50, another has had 4 MIs and 2 bypasses     Prior to Admission medications   Medication Sig Start Date End Date Taking? Authorizing Provider  acetaminophen (TYLENOL) 650 MG CR tablet Take 1,300 mg by mouth in the morning and at bedtime.    [provider]  acyclovir (ZOVIRAX) 400 MG tablet Take 800 mg by mouth daily as needed (fever blisters).     [provider]  alendronate (FOSAMAX) 70 MG tablet Take 70 mg by mouth every Saturday.  10/16/18   [provider]  aspirin 81 MG chewable tablet Chew 1 tablet (81 mg total) by mouth daily. 02/06/21   Almyra Deforest, PA  clopidogrel (PLAVIX) 75 MG tablet Take 1 tablet (75 mg total) by mouth daily. 02/05/21   Almyra Deforest, PA  diphenhydramine-acetaminophen (TYLENOL PM) 25-500 MG TABS tablet Take 2 tablets by mouth at bedtime.    [provider]  fluticasone (FLONASE) 50 MCG/ACT nasal spray Place 2 sprays into both nostrils daily as needed for allergies.  02/22/18   [provider]  gatifloxacin (ZYMAXID) 0.5 % SOLN Place 1 drop into the left eye 2 (two) times daily. 01/13/20   [provider]  isosorbide mononitrate (IMDUR) 30 MG 24 hr tablet Take 1 tablet (30 mg total) by mouth daily. 02/06/21   Almyra Deforest, PA  levothyroxine (SYNTHROID, LEVOTHROID) 50 MCG tablet Take 50 mcg by mouth daily before breakfast. 03/31/16   [provider]  metoprolol tartrate (LOPRESSOR) 25 MG tablet Take 0.5 tablets (12.5 mg total) by mouth 2 (two) times daily. 02/23/21 05/24/21  Richardson Dopp T, PA-C  nitroGLYCERIN (NITROSTAT) 0.4 MG SL tablet Place 1 tablet (0.4 mg total) under the tongue every 5 (five) minutes x 3 doses as needed for chest pain. 11/09/19    Sande Rives E, PA-C  pantoprazole (PROTONIX) 20 MG tablet Take 20 mg by mouth 2 (two) times daily.    [provider]  rOPINIRole (REQUIP) 2 MG tablet Take 2 mg by mouth at bedtime.    [provider]  traMADol (ULTRAM) 50 MG tablet Take 1 tablet (50 mg total) by mouth every 6 (six) hours as needed for severe pain. 07/13/16   Nani Skillern, PA-C    Physical Exam: Vitals:   05/06/21 1530 05/06/21 1600 05/06/21 1630 05/06/21 2011  BP: (!) 128/50 126/67 126/67 (!) 154/72  Pulse: 64 63 64 69  Resp: 13 13 13 18   Temp:    98.2 F (36.8 C)  TempSrc:    Oral  SpO2: 98% 95% 95% 100%  Weight:      Height:        Constitutional: NAD, calm  Eyes: PERTLA, lids and conjunctivae normal ENMT: Mucous membranes are moist. Posterior pharynx clear of any exudate or lesions.   Neck: supple, no masses  Respiratory: clear to auscultation bilaterally, no wheezing, no crackles. No accessory muscle use.  Cardiovascular: S1 & S2 heard, regular rate and rhythm. Trace pedal edema.   Abdomen: No distension, no tenderness, soft. Bowel sounds active.  Musculoskeletal: no clubbing / cyanosis. No joint deformity upper and lower extremities.   Skin: no significant rashes, lesions, ulcers. Warm, dry, well-perfused. Neurologic: CN 2-12 grossly intact. Sensation intact. Moving all extremities.  Psychiatric: Alert and oriented to person, place, and situation. Pleasant and cooperative.    Labs and Imaging on Admission: I have personally reviewed following labs and imaging studies  CBC: Recent Labs  Lab 05/06/21 1120  WBC 6.2  NEUTROABS 3.5  HGB 8.2*  HCT 26.2*  MCV 93.9  PLT 440*   Basic Metabolic Panel: Recent Labs  Lab 05/06/21 1120  NA 137  K 4.2  CL 105  CO2 24  GLUCOSE 112*  BUN 16  CREATININE 0.98  CALCIUM 8.8*   GFR: Estimated Creatinine Clearance: 32 mL/min (by C-G formula based on SCr of 0.98 mg/dL). Liver Function Tests: Recent Labs  Lab 05/06/21 1120   AST 14*  ALT 8  ALKPHOS 36*  BILITOT 0.3  PROT 6.4*  ALBUMIN 3.7   No results for input(s): LIPASE, AMYLASE in the last 168 hours. No results for input(s): AMMONIA in the last 168 hours. Coagulation Profile: No results for input(s): INR, PROTIME in the last 168 hours. Cardiac Enzymes: No results for input(s): CKTOTAL, CKMB, CKMBINDEX, TROPONINI in the last 168 hours. BNP (last 3 results) No results for input(s): PROBNP in the last 8760 hours. HbA1C: No results for input(s): HGBA1C in the last 72 hours. CBG: No results for input(s): GLUCAP in the last 168 hours. Lipid Profile: No results for input(s): CHOL, HDL, LDLCALC, TRIG, CHOLHDL, LDLDIRECT in the last 72 hours. Thyroid Function Tests: No results for input(s): TSH, T4TOTAL, FREET4, T3FREE, THYROIDAB in the last 72 hours. Anemia Panel: No results for input(s): VITAMINB12, FOLATE, FERRITIN, TIBC, IRON, RETICCTPCT in the last 72 hours. Urine analysis:    Component Value Date/Time   COLORURINE YELLOW 04/14/2021 1406   APPEARANCEUR HAZY (A) 04/14/2021 1406   LABSPEC >1.030 (H) 04/14/2021 1406   PHURINE 5.5 04/14/2021 1406   GLUCOSEU NEGATIVE 04/14/2021 1406   [place] 04/14/2021 1406   [place] 04/14/2021 1406   [place] 04/14/2021 1406   PROTEINUR NEGATIVE 04/14/2021 1406   NITRITE NEGATIVE 04/14/2021 1406   LEUKOCYTESUR NEGATIVE 04/14/2021 1406   Sepsis Labs: @LABRCNTIP (procalcitonin:4,lacticidven:4) ) Recent Results (from the past 240 hour(s))  Resp Panel by RT-PCR (Flu A&B, Covid) Nasopharyngeal Swab     Status: Abnormal   Collection Time: 05/06/21 12:44 PM   Specimen: Nasopharyngeal Swab; Nasopharyngeal(NP) swabs in vial transport medium  Result Value Ref Range Status   SARS Coronavirus 2 by RT PCR POSITIVE (A) NEGATIVE Final    Comment: RESULT CALLED TO, READ BACK BY AND VERIFIED WITH: SOPHIE GOUGE RN @1403  05/06/2021 OLSONM (NOTE) SARS-CoV-2 target nucleic acids are  DETECTED.  The SARS-CoV-2 RNA is generally detectable in upper respiratory specimens during the acute phase of infection. Positive results are indicative of the presence of the identified virus, but do not rule out bacterial infection or co-infection with other pathogens not detected by the test. Clinical correlation with patient history and other diagnostic information is necessary to determine patient infection status. The expected result is Negative.  Fact Sheet for Patients: EntrepreneurPulse.com.au  Fact Sheet for Healthcare Providers: IncredibleEmployment.be  This test is not yet approved or cleared by the [place] FDA and  has been authorized for detection and/or diagnosis of SARS-CoV-2 by FDA under an Emergency Use Authorization (EUA).  This EUA will remain in effect (meaning this test  can be used) for the duration of  the COVID-19 declaration under Section 564(b)(1) of the Act, 21 U.S.C. section 360bbb-3(b)(1), unless the authorization is terminated or revoked sooner.     Influenza A by PCR NEGATIVE NEGATIVE Final   Influenza B by PCR NEGATIVE NEGATIVE Final    Comment: (NOTE) The Xpert Xpress SARS-CoV-2/FLU/RSV plus assay is intended as an aid in the diagnosis of influenza from Nasopharyngeal swab specimens and should not be used as a sole basis for treatment. Nasal washings and aspirates are unacceptable for Xpert Xpress SARS-CoV-2/FLU/RSV testing.  Fact Sheet for Patients: EntrepreneurPulse.com.au  Fact Sheet for Healthcare Providers: IncredibleEmployment.be  This test is not yet approved or cleared by the [place] FDA and has been authorized for detection and/or diagnosis of SARS-CoV-2 by FDA under an Emergency Use Authorization (EUA). This EUA will remain in effect (meaning this test can be used) for the duration of the COVID-19 declaration under Section 564(b)(1) of the Act, 21  U.S.C.  section 360bbb-3(b)(1), unless the authorization is terminated or revoked.  Performed at [facility], [address] [place], [place] [address]      Radiological Exams on Admission: No results found.  EKG: Independently reviewed. Sinus rhythm.   Assessment/Plan   1. Symptomatic anemia; GI bleeding  - Presents with melena and lightheadedness and is found to have Hgb 8.2, down from 10.7 on Apr 14, 2021  - FOBT is positive; BUN normal; hemodynamically stable  - She was started on IV PPI q12h in ED  - GI consulting and much appreciated  - Type and screen, continue bowel rest, serial H&H, type and screen, transfuse as needed to keep Hgb 8 or greater, hold antiplatelets pending GI consultation    2. CAD; PAD  - Hx of CABG in 2017, cardiac cath in March 2022 with unsuccessful angioplasty of SVG to right PDA, and right SFA angioplasty in April 2022  - Hold ASA and Plavix pending GI consultation, continue nitrates and beta-blocker as tolerated, keep Hgb 8 or greater   3. Hypothyroidism  - Continue Synthroid    4. Recent COVID infection  - Patient developed URI symptoms on 5/13, tested positive for COVID on 5/19 (in [place]), has recovered, and no longer requires isolation per Tammy with Infection Prevention     DVT prophylaxis: SCDs  Code Status: Full  Level of Care: Level of care: Telemetry Family Communication: None present  Disposition Plan:  Patient is from: Home  Anticipated d/c is to: Home  Anticipated d/c date is: 05/08/21  Patient currently: Pending GI consult, serial H&H  Consults called: GI  Admission status: Observation     Vianne Bulls, MD [facility]  05/06/2021, 8:52 PM

## 2021-05-06 NOTE — Progress Notes (Signed)
Night admit MD notified that Pt arrived to 4W.

## 2021-05-07 DIAGNOSIS — K921 Melena: Secondary | ICD-10-CM | POA: Diagnosis not present

## 2021-05-07 LAB — TYPE AND SCREEN
ABO/RH(D): A POS
Antibody Screen: NEGATIVE
Unit division: 0

## 2021-05-07 LAB — BASIC METABOLIC PANEL
Anion gap: 9 (ref 5–15)
BUN: 13 mg/dL (ref 8–23)
CO2: 22 mmol/L (ref 22–32)
Calcium: 8.7 mg/dL — ABNORMAL LOW (ref 8.9–10.3)
Chloride: 107 mmol/L (ref 98–111)
Creatinine, Ser: 0.6 mg/dL (ref 0.44–1.00)
GFR, Estimated: >60 mL/min (ref >60)
Glucose, Bld: 92 mg/dL (ref 70–99)
Potassium: 3.5 mmol/L (ref 3.5–5.1)
Sodium: 138 mmol/L (ref 135–145)

## 2021-05-07 LAB — BPAM RBC
Blood Product Expiration Date: 202206262359
ISSUE DATE / TIME: 202206022318
Unit Type and Rh: 6200

## 2021-05-07 LAB — HEMOGLOBIN AND HEMATOCRIT, BLOOD
HCT: 30 % — ABNORMAL LOW (ref 36.0–46.0)
Hemoglobin: 9.9 g/dL — ABNORMAL LOW (ref 12.0–15.0)

## 2021-05-07 MED ORDER — PROPRANOLOL HCL 40 MG PO TABS
40.0000 mg | ORAL_TABLET | Freq: Once | ORAL | Status: AC
  Administered 2021-05-07: 40 mg via ORAL
  Filled 2021-05-07: qty 1

## 2021-05-07 NOTE — Consult Note (Signed)
Referring Provider: Dr. Mitzi Hansen Primary Care Physician:  Stacie Glaze, DO Primary Gastroenterologist:  [facility]  Reason for Consultation:  Anemia, melena  HPI: Katherine Hall is a 80 y.o. female with history of CAD status post CABG in 2017, hypothyroidism, PAD s/p right SFA angioplasty on 04/02/2021 on plavix/aspirin prior to admission, history of PUD 2009 and 2016 negative H pylori presents with near syncope, melena and found to have anemia with hemoglobin of 7.4 on admission (down from 10.7 on 04/14/21).   Patient reports possible intermittent black stools since Jan with fecal incontinence, worsening melena roughly 3 weeks ago.  Patient has had nausea without vomiting. She has been having fatigue, dizziness prior to admission.  She has had sore throat, cough, denies SOB, fever, chills, chest pain.  Denies abdominal pain. BM soft 3 x a week.  Denies any NSAID use, but has been on Plavix daily since 2017 with CABG and has been on ASA with it since her right SFA angioplasty in April of this year.  Rare ETOH use, last time before Jan and once a month.  Is on Protonix once daily.  Patient has remote history of PUD via EGD on 09/02/2015 (reviewed) that showed hiatal hernia, erosive gastritis, gastric ulcers and candidal esophagitis, negative H pylori.  Patient is on Fosamax. Denies family history of gastrointestinal malignancies.  Colonoscopy per Chart review, full report not available Colonoscopy:1/12 when she underwent colonoscopy due to her PHx of colon polyps, which revealed pancolonic diverticulosis, a 8mm cecal TA polyp was removed, 3 diminutive sessile hyperplastic rectal polyps were removed, and mild internal hemorrhoids. She was recommended to have repeat colonoscopy in 1/17.    Past Medical History:  Diagnosis Date  . Anemia   . Arthritis   . Coronary artery disease    s/p CABG in 2017 // Myoview 11/21: No ischemia or infarction, EF > 65, low risk  // [place] >> cath 3/22:  S-RPDA w severe disease - PCI unsuccessful & c/b peri-procedure MI (acute closure w wire manipulation>>inf ST elev) >> Med Rx   . Echocardiogram 02/2021    EF 65-70, no RWMA, mild LVH, Gr 1 DD, normal RVSF, RVSP 19.5, mild AI, mild AV sclerosis  . Familial hypercholesterolemia    a. h/o intolerance of statins, prev on Repatha but insurance stopped covering.  . Gastric ulcer    a. h/o bleeding ulcer 2009 requiring 3 pints of blood.  [name] History of removal of cyst    a. 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  [name] HTN (hypertension)   . Hypothyroidism   . PAD (peripheral artery disease) (HCC)    S/p L SFA PTA // ABIs/LE arterial US 4/22: R 0.7; L 0.86 // R SFA 75-99; L SFA angioplasty site 30-49, distal 50-74 // ectatic distal aorta 2.6 x 2.6 cm  . Prediabetes     Past Surgical History:  Procedure Laterality Date  . ABDOMINAL AORTOGRAM W/LOWER EXTREMITY Bilateral 11/06/2019   Procedure: ABDOMINAL AORTOGRAM W/LOWER EXTREMITY;  Surgeon: Wellington Hampshire, MD;  Location: [place] [facility];  Service: Cardiovascular;  Laterality: Bilateral;  . ABDOMINAL AORTOGRAM W/LOWER EXTREMITY N/A 03/31/2021   Procedure: ABDOMINAL AORTOGRAM W/LOWER EXTREMITY;  Surgeon: Wellington Hampshire, MD;  Location: [place] [facility];  Service: Cardiovascular;  Laterality: N/A;  . ABDOMINAL HYSTERECTOMY    . APPENDECTOMY    . CARDIAC CATHETERIZATION N/A 07/05/2016   Procedure: Left Heart Cath and Coronary Angiography;  Surgeon: Burnell Blanks,  MD;  Location: [place] [facility];  Service: Cardiovascular;  Laterality: N/A;  . CARDIAC SURGERY     cyst, not on heart.  . CORONARY ARTERY BYPASS GRAFT N/A 07/07/2016   Procedure: CORONARY ARTERY BYPASS GRAFTING (CABG) x 5 with endoscopic harvesting of the right greater saphenous vein;  Surgeon: Gaye Pollack, MD;  Location: [place] [facility];  Service: Open Heart Surgery;  Laterality: N/A;  . CORONARY BALLOON ANGIOPLASTY N/A 02/03/2021   Procedure: CORONARY  BALLOON ANGIOPLASTY;  Surgeon: Wellington Hampshire, MD;  Location: [place] [facility];  Service: Cardiovascular;  Laterality: N/A;  svg to pda  . HEMORRHOID SURGERY    . INTRAOPERATIVE TRANSESOPHAGEAL ECHOCARDIOGRAM N/A 07/07/2016   Procedure: INTRAOPERATIVE TRANSESOPHAGEAL ECHOCARDIOGRAM;  Surgeon: Gaye Pollack, MD;  Location: [facility];  Service: Open Heart Surgery;  Laterality: N/A;  . LEFT HEART CATH AND CORS/GRAFTS ANGIOGRAPHY N/A 02/03/2021   Procedure: LEFT HEART CATH AND CORS/GRAFTS ANGIOGRAPHY;  Surgeon: Wellington Hampshire, MD;  Location: [place] [facility];  Service: Cardiovascular;  Laterality: N/A;  . PERIPHERAL VASCULAR BALLOON ANGIOPLASTY  11/06/2019   Procedure: PERIPHERAL VASCULAR BALLOON ANGIOPLASTY;  Surgeon: Wellington Hampshire, MD;  Location: [place] [facility];  Service: Cardiovascular;;  cutting and dcb  . PERIPHERAL VASCULAR BALLOON ANGIOPLASTY Right 03/31/2021   Procedure: PERIPHERAL VASCULAR BALLOON ANGIOPLASTY;  Surgeon: Wellington Hampshire, MD;  Location: [place] [facility];  Service: Cardiovascular;  Laterality: Right;  SFA  . SHOULDER SURGERY      Prior to Admission medications   Medication Sig Start Date End Date Taking? Authorizing Provider  acetaminophen (TYLENOL) 650 MG CR tablet Take 1,300 mg by mouth in the morning and at bedtime.   Yes [provider]  acyclovir (ZOVIRAX) 400 MG tablet Take 800 mg by mouth daily as needed (fever blisters).    Yes [provider]  alendronate (FOSAMAX) 70 MG tablet Take 70 mg by mouth once a week. 10/16/18  Yes [provider]  aspirin 81 MG chewable tablet Chew 1 tablet (81 mg total) by mouth daily. 02/06/21  Yes Almyra Deforest, PA  clopidogrel (PLAVIX) 75 MG tablet Take 1 tablet (75 mg total) by mouth daily. 02/05/21  Yes Almyra Deforest, PA  diphenhydramine-acetaminophen (TYLENOL PM) 25-500 MG TABS tablet Take 2 tablets by mouth at bedtime.   Yes [provider]  fluticasone (FLONASE) 50 MCG/ACT nasal spray Place 2  sprays into both nostrils daily as needed for allergies.  02/22/18  Yes [provider]  hydrochlorothiazide (HYDRODIURIL) 25 MG tablet Take 25 mg by mouth daily.   Yes [provider]  isosorbide mononitrate (IMDUR) 30 MG 24 hr tablet Take 30 mg by mouth daily.   Yes [provider]  levothyroxine (SYNTHROID, LEVOTHROID) 50 MCG tablet Take 50 mcg by mouth daily before breakfast. 03/31/16  Yes [provider]  metoprolol tartrate (LOPRESSOR) 25 MG tablet Take 0.5 tablets (12.5 mg total) by mouth 2 (two) times daily. 02/23/21 05/24/21 Yes Weaver, Scott T, PA-C  nitroGLYCERIN (NITROSTAT) 0.4 MG SL tablet Place 1 tablet (0.4 mg total) under the tongue every 5 (five) minutes x 3 doses as needed for chest pain. 11/09/19  Yes Sande Rives E, PA-C  pantoprazole (PROTONIX) 20 MG tablet Take 20 mg by mouth daily.   Yes [provider]  pantoprazole (PROTONIX) 40 MG tablet Take 40 mg by mouth daily.   Yes [provider]  rOPINIRole (REQUIP) 2 MG tablet Take 2 mg by mouth at bedtime.  Yes [provider]  traMADol (ULTRAM) 50 MG tablet Take 1 tablet (50 mg total) by mouth every 6 (six) hours as needed for severe pain. Patient taking differently: Take 50 mg by mouth 2 (two) times daily. 07/13/16  Yes Lars Pinks M, PA-C  isosorbide mononitrate (IMDUR) 30 MG 24 hr tablet Take 1 tablet (30 mg total) by mouth daily. Patient not taking: No sig reported 02/06/21   Almyra Deforest, PA    Scheduled Meds: . isosorbide mononitrate  30 mg Oral Daily  . levothyroxine  50 mcg Oral QAC breakfast  . metoprolol tartrate  12.5 mg Oral BID  . pantoprazole (PROTONIX) IV  40 mg Intravenous Q12H  . rOPINIRole  2 mg Oral QHS   Continuous Infusions: PRN Meds:.acetaminophen **OR** acetaminophen, nitroGLYCERIN, ondansetron **OR** ondansetron (ZOFRAN) IV, traMADol  Allergies as of 05/06/2021 - Review Complete 05/06/2021  Allergen Reaction Noted  . Penicillins  Anaphylaxis 07/03/2016  . Lincomycin Swelling and Other (See Comments) 11/20/2015  . Naproxen sodium Other (See Comments) 07/19/2014  . Statins Other (See Comments) 07/19/2014  . Latex Other (See Comments) 07/07/2016  . Tape  07/19/2014    Family History  Problem Relation Age of Onset  . Stroke Mother        Stroke age 52  . CAD Father        died of MI age 57  . Coronary artery disease Sister        one sister had massive MI at 19, another sister has 1 stents  . Coronary artery disease Brother        one brother had MI at age 56 and died at 53, another has had 4 MIs and 2 bypasses    Social History   Socioeconomic History  . Marital status: Married    Spouse name: Not on file  . Number of children: Not on file  . Years of education: Not on file  . Highest education level: Not on file  Occupational History  . Not on file  Tobacco Use  . Smoking status: Never Smoker  . Smokeless tobacco: Never Used  Vaping Use  . Vaping Use: Never used  Substance and Sexual Activity  . Alcohol use: No    Comment: social  . Drug use: No  . Sexual activity: Not on file  Other Topics Concern  . Not on file  Social History Narrative  . Not on file   Social Determinants of Health   Financial Resource Strain: Not on file  Food Insecurity: Not on file  Transportation Needs: Not on file  Physical Activity: Not on file  Stress: Not on file  Social Connections: Not on file  Intimate Partner Violence: Not on file    Review of Systems:  Review of Systems  Constitutional: Positive for malaise/fatigue. Negative for chills and fever.  HENT: Positive for sore throat. Negative for hearing loss.   Eyes: Negative for discharge and redness.  Respiratory: Positive for cough and shortness of breath. Negative for sputum production and wheezing.   Cardiovascular: Negative for chest pain and leg swelling.  Gastrointestinal: Positive for melena and nausea. Negative for abdominal pain, blood in  stool, constipation, diarrhea and vomiting.  Genitourinary: Negative for frequency.  Musculoskeletal: Negative.   Skin: Negative for itching and rash.  Neurological: Positive for headaches. Negative for dizziness and loss of consciousness.  Psychiatric/Behavioral: Negative for memory loss. The patient is not nervous/anxious.      Physical Exam: Vital signs: Vitals:   05/07/21  0509 05/07/21 0757  BP: 123/60 (!) 158/70  Pulse: 78 68  Resp: 17 18  Temp: 98.1 F (36.7 C) 97.8 F (36.6 C)  SpO2: 100% 97%   Last BM Date: 05/05/21 Physical Exam Constitutional:      General: She is not in acute distress.    Appearance: Normal appearance. She is not ill-appearing.  HENT:     Head: Normocephalic.  Eyes:     Conjunctiva/sclera: Conjunctivae normal.  Cardiovascular:     Rate and Rhythm: Normal rate and regular rhythm.     Heart sounds: Normal heart sounds. No murmur heard.   Pulmonary:     Effort: Pulmonary effort is normal.     Breath sounds: Normal breath sounds.  Abdominal:     General: Bowel sounds are normal.     Palpations: Abdomen is soft. There is no mass.     Tenderness: There is no abdominal tenderness. There is no guarding or rebound.  Musculoskeletal:        General: Normal range of motion.  Skin:    General: Skin is warm and dry.  Neurological:     General: No focal deficit present.     Mental Status: She is alert and oriented to person, place, and time.  Psychiatric:        Mood and Affect: Mood normal.        Behavior: Behavior normal.      GI:  Lab Results: Recent Labs    05/06/21 1120 05/06/21 2105 05/07/21 0433  WBC 6.2  --   --   HGB 8.2* 7.4* 9.9*  HCT 26.2* 23.6* 30.0*  PLT 490*  --   --    BMET Recent Labs    05/06/21 1120 05/07/21 0433  NA 137 138  K 4.2 3.5  CL 105 107  CO2 24 22  GLUCOSE 112* 92  BUN 16 13  CREATININE 0.98 0.60  CALCIUM 8.8* 8.7*   LFT Recent Labs    05/06/21 1120  PROT 6.4*  ALBUMIN 3.7  AST 14*   ALT 8  ALKPHOS 36*  BILITOT 0.3   PT/INR No results for input(s): LABPROT, INR in the last 72 hours.  Studies/Results: No results found.  Impression and Plan Anemia Continue to monitor H&H with transfusion as needed to maintain hemoglobin greater than 7.  Melena with history of PUD Patient with previous NSAID induced peptic ulcer disease in 2006/2016, with recent Plavix and aspirin use due to PAD. BUN 16-->13, cr 0.98 Hgb now at 9.9 after receiviing one unit of blood Continue protonix 40 mg IV BID Place patient n.p.o. at midnight will set up for endoscopy tomorrow  I thoroughly discussed the procedure with the patient (at bedside) to include nature of the procedure, alternatives, benefits, and risks (including but not limited to bleeding, infection, perforation, anesthesia/cardiac pulmonary complications).  Patient verbalized understanding and gave verbal consent to proceed with EGD  PAD s/p right SFA angioplasty on 04/02/2021 was on plavix/ASA prior to admission, currently on hold, last dose of plavix was 06/02 AM.   CAD history s/p CABG in 2017   LOS: 0 days   Vladimir Crofts  PA-C 05/07/2021, 8:17 AM  Contact #  [phone]

## 2021-05-07 NOTE — Progress Notes (Signed)
PROGRESS NOTE    Katherine Hall  EZM:[id number] DOB: Oct 29, 1941 DOA: 05/06/2021 PCP: Stacie Glaze, DO   Chief Complain: Lightheadedness, fatigue, anemia  Brief Narrative:  Patient is 80 year old female with history of coronary disease status post CABG in 2017, hypothyroidism, peptic ulcer disease, peripheral artery disease status post right SFA angioplasty on 04/02/2021 now presenting to the emerged part with complaints of lightheadedness, near syncope, dark stool and was found to have hemoglobin of 7.4 on outpatient work-up, down from 10.7 3 weeks ago.  Patient was experiencing fatigue, and reported black stools for roughly 3 weeks.  No abdominal pain but had some nausea.  Patient takes aspirin and Plavix daily since right SFA angioplasty in April.  She is also recently recovering from shingles involving her right trunk, not currently active.  On presentation she was hemodynamically stable.  Hemoglobin was 8.2.  FOBT was positive.  GI consulted.Plan for EGD tomorrow  Assessment & Plan:   Principal Problem:   GI bleed Active Problems:   Hypothyroidism   Coronary artery disease involving native coronary artery of native heart without angina pectoris   Peripheral arterial disease (HCC)   Symptomatic anemia/upper GI bleed: Presented with melena, lightheadedness.  Hemoglobin was 10.7 on May 11/22, down to 7.4 recently.  Patient was having black tarry stools.  FOBT positive.  Patient takes Plavix and aspirin for peripheral vascular disease.  Started on PPI IV every 12.  GI consulted.  Hemoglobin in the range of 9 today.  We will continue to monitor H&H.  Plan for EGD tomorrow.  Coronary artery disease/peripheral artery disease: History of CABG in 2017, cardiac cath in March 2022 with unsuccessful angioplasty of SVG to right PDA.  Status post right SFA angioplasty in April 22.  On aspirin and Plavix at home which are on hold.  On nitrates, beta-blockers.  Hypothyroidism: Continue  Synthyroid  Recent COVID infection: Patient developed upper respiratory symptoms on 5/13, tested positive for COVID on 5/19, no longer in isolation  Generalized weakness: PT consulted         DVT prophylaxis:SCD Code Status: Full Family Communication: Called husband on phone today,call not received Status is: Observation  The patient remains OBS appropriate and will d/c before 2 midnights.  Dispo: The patient is from: Home              Anticipated d/c is to: Home              Patient currently is not medically stable to d/c.   Difficult to place patient No     Consultants: GI  Procedures:None  Antimicrobials:  Anti-infectives (From admission, onward)   None      Subjective:  Patient seen and examined at bedside this morning.  Hemodynamically stable.  Denies any black stools or hematochezia.  Complains of severe weakness.  Denies any abdomen pain, nausea, vomiting or chest pain.  On room air.   Objective: Vitals:   05/06/21 2348 05/07/21 0114 05/07/21 0241 05/07/21 0509  BP: (!) 135/54 102/83 135/67 123/60  Pulse: 61 72 66 78  Resp: 16 16 20 17   Temp: 98.2 F (36.8 C) 98.2 F (36.8 C) 98.5 F (36.9 C) 98.1 F (36.7 C)  TempSrc: Oral  Oral Oral  SpO2: 98% 100% 98% 100%  Weight:    59.2 kg  Height:        Intake/Output Summary (Last 24 hours) at 05/07/2021 0756 Last data filed at 05/07/2021 0700 Gross per 24 hour  Intake 1197 ml  Output --  Net 1197 ml   Filed Weights   05/06/21 1105 05/07/21 0509  Weight: 56.2 kg 59.2 kg    Examination:  General exam: Appears calm and comfortable ,Not in distress, pleasant elderly female HEENT:PERRL,Oral mucosa moist, Ear/Nose normal on gross exam Respiratory system: Bilateral equal air entry, normal vesicular breath sounds, no wheezes or crackles  Cardiovascular system: S1 & S2 heard, RRR. No JVD, murmurs, rubs, gallops or clicks. No pedal edema. Gastrointestinal system: Abdomen is nondistended, soft and  nontender. No organomegaly or masses felt. Normal bowel sounds heard. Central nervous system: Alert and oriented. No focal neurological deficits. Extremities: No edema, no clubbing ,no cyanosis Skin: No rashes, lesions or ulcers,no icterus ,no pallor    Data Reviewed: I have personally reviewed following labs and imaging studies  CBC: Recent Labs  Lab 05/06/21 1120 05/06/21 2105 05/07/21 0433  WBC 6.2  --   --   NEUTROABS 3.5  --   --   HGB 8.2* 7.4* 9.9*  HCT 26.2* 23.6* 30.0*  MCV 93.9  --   --   PLT 490*  --   --    Basic Metabolic Panel: Recent Labs  Lab 05/06/21 1120 05/07/21 0433  NA 137 138  K 4.2 3.5  CL 105 107  CO2 24 22  GLUCOSE 112* 92  BUN 16 13  CREATININE 0.98 0.60  CALCIUM 8.8* 8.7*   GFR: Estimated Creatinine Clearance: 40.3 mL/min (by C-G formula based on SCr of 0.6 mg/dL). Liver Function Tests: Recent Labs  Lab 05/06/21 1120  AST 14*  ALT 8  ALKPHOS 36*  BILITOT 0.3  PROT 6.4*  ALBUMIN 3.7   No results for input(s): LIPASE, AMYLASE in the last 168 hours. No results for input(s): AMMONIA in the last 168 hours. Coagulation Profile: No results for input(s): INR, PROTIME in the last 168 hours. Cardiac Enzymes: No results for input(s): CKTOTAL, CKMB, CKMBINDEX, TROPONINI in the last 168 hours. BNP (last 3 results) No results for input(s): PROBNP in the last 8760 hours. HbA1C: No results for input(s): HGBA1C in the last 72 hours. CBG: No results for input(s): GLUCAP in the last 168 hours. Lipid Profile: No results for input(s): CHOL, HDL, LDLCALC, TRIG, CHOLHDL, LDLDIRECT in the last 72 hours. Thyroid Function Tests: No results for input(s): TSH, T4TOTAL, FREET4, T3FREE, THYROIDAB in the last 72 hours. Anemia Panel: No results for input(s): VITAMINB12, FOLATE, FERRITIN, TIBC, IRON, RETICCTPCT in the last 72 hours. Sepsis Labs: No results for input(s): PROCALCITON, LATICACIDVEN in the last 168 hours.  Recent Results (from the past 240  hour(s))  Resp Panel by RT-PCR (Flu A&B, Covid) Nasopharyngeal Swab     Status: Abnormal   Collection Time: 05/06/21 12:44 PM   Specimen: Nasopharyngeal Swab; Nasopharyngeal(NP) swabs in vial transport medium  Result Value Ref Range Status   SARS Coronavirus 2 by RT PCR POSITIVE (A) NEGATIVE Final    Comment: RESULT CALLED TO, READ BACK BY AND VERIFIED WITH: SOPHIE GOUGE RN @1403  05/06/2021 OLSONM (NOTE) SARS-CoV-2 target nucleic acids are DETECTED.  The SARS-CoV-2 RNA is generally detectable in upper respiratory specimens during the acute phase of infection. Positive results are indicative of the presence of the identified virus, but do not rule out bacterial infection or co-infection with other pathogens not detected by the test. Clinical correlation with patient history and other diagnostic information is necessary to determine patient infection status. The expected result is Negative.  Fact Sheet for Patients: EntrepreneurPulse.com.au  Fact Sheet for Healthcare Providers:  IncredibleEmployment.be  This test is not yet approved or cleared by the [place] and  has been authorized for detection and/or diagnosis of SARS-CoV-2 by FDA under an Emergency Use Authorization (EUA).  This EUA will remain in effect (meaning this test  can be used) for the duration of  the COVID-19 declaration under Section 564(b)(1) of the Act, 21 U.S.C. section 360bbb-3(b)(1), unless the authorization is terminated or revoked sooner.     Influenza A by PCR NEGATIVE NEGATIVE Final   Influenza B by PCR NEGATIVE NEGATIVE Final    Comment: (NOTE) The Xpert Xpress SARS-CoV-2/FLU/RSV plus assay is intended as an aid in the diagnosis of influenza from Nasopharyngeal swab specimens and should not be used as a sole basis for treatment. Nasal washings and aspirates are unacceptable for Xpert Xpress SARS-CoV-2/FLU/RSV testing.  Fact Sheet for  Patients: EntrepreneurPulse.com.au  Fact Sheet for Healthcare Providers: IncredibleEmployment.be  This test is not yet approved or cleared by the [place] FDA and has been authorized for detection and/or diagnosis of SARS-CoV-2 by FDA under an Emergency Use Authorization (EUA). This EUA will remain in effect (meaning this test can be used) for the duration of the COVID-19 declaration under Section 564(b)(1) of the Act, 21 U.S.C. section 360bbb-3(b)(1), unless the authorization is terminated or revoked.  Performed at [facility], [address], [place], [place] [address]          Radiology Studies: No results found.      Scheduled Meds: . isosorbide mononitrate  30 mg Oral Daily  . levothyroxine  50 mcg Oral QAC breakfast  . metoprolol tartrate  12.5 mg Oral BID  . pantoprazole (PROTONIX) IV  40 mg Intravenous Q12H  . rOPINIRole  2 mg Oral QHS   Continuous Infusions:   LOS: 0 days    Time spent: 35 mins.More than 50% of that time was spent in counseling and/or coordination of care.      Shelly Coss, MD [facility] P6/02/2021, 7:56 AM

## 2021-05-07 NOTE — H&P (View-Only) (Signed)
Referring Provider: Dr. Mitzi Hansen Primary Care Physician:  Stacie Glaze, DO Primary Gastroenterologist:  [facility]  Reason for Consultation:  Anemia, melena  HPI: Katherine Hall is a 80 y.o. female with history of CAD status post CABG in 2017, hypothyroidism, PAD s/p right SFA angioplasty on 04/02/2021 on plavix/aspirin prior to admission, history of PUD 2009 and 2016 negative H pylori presents with near syncope, melena and found to have anemia with hemoglobin of 7.4 on admission (down from 10.7 on 04/14/21).   Patient reports possible intermittent black stools since Jan with fecal incontinence, worsening melena roughly 3 weeks ago.  Patient has had nausea without vomiting. She has been having fatigue, dizziness prior to admission.  She has had sore throat, cough, denies SOB, fever, chills, chest pain.  Denies abdominal pain. BM soft 3 x a week.  Denies any NSAID use, but has been on Plavix daily since 2017 with CABG and has been on ASA with it since her right SFA angioplasty in April of this year.  Rare ETOH use, last time before Jan and once a month.  Is on Protonix once daily.  Patient has remote history of PUD via EGD on 09/02/2015 (reviewed) that showed hiatal hernia, erosive gastritis, gastric ulcers and candidal esophagitis, negative H pylori.  Patient is on Fosamax. Denies family history of gastrointestinal malignancies.  Colonoscopy per Chart review, full report not available Colonoscopy:1/12 when she underwent colonoscopy due to her PHx of colon polyps, which revealed pancolonic diverticulosis, a 54mm cecal TA polyp was removed, 3 diminutive sessile hyperplastic rectal polyps were removed, and mild internal hemorrhoids. She was recommended to have repeat colonoscopy in 1/17.    Past Medical History:  Diagnosis Date  . Anemia   . Arthritis   . Coronary artery disease    s/p CABG in 2017 // Myoview 11/21: No ischemia or infarction, EF > 65, low risk  // [place] >> cath 3/22:  S-RPDA w severe disease - PCI unsuccessful & c/b peri-procedure MI (acute closure w wire manipulation>>inf ST elev) >> Med Rx   . Echocardiogram 02/2021    EF 65-70, no RWMA, mild LVH, Gr 1 DD, normal RVSF, RVSP 19.5, mild AI, mild AV sclerosis  . Familial hypercholesterolemia    a. h/o intolerance of statins, prev on Repatha but insurance stopped covering.  . Gastric ulcer    a. h/o bleeding ulcer 2009 requiring 3 pints of blood.  [name] History of removal of cyst    a. 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  [name] HTN (hypertension)   . Hypothyroidism   . PAD (peripheral artery disease) (HCC)    S/p L SFA PTA // ABIs/LE arterial US 4/22: R 0.7; L 0.86 // R SFA 75-99; L SFA angioplasty site 30-49, distal 50-74 // ectatic distal aorta 2.6 x 2.6 cm  . Prediabetes     Past Surgical History:  Procedure Laterality Date  . ABDOMINAL AORTOGRAM W/LOWER EXTREMITY Bilateral 11/06/2019   Procedure: ABDOMINAL AORTOGRAM W/LOWER EXTREMITY;  Surgeon: Wellington Hampshire, MD;  Location: [place] [facility];  Service: Cardiovascular;  Laterality: Bilateral;  . ABDOMINAL AORTOGRAM W/LOWER EXTREMITY N/A 03/31/2021   Procedure: ABDOMINAL AORTOGRAM W/LOWER EXTREMITY;  Surgeon: Wellington Hampshire, MD;  Location: [place] [facility];  Service: Cardiovascular;  Laterality: N/A;  . ABDOMINAL HYSTERECTOMY    . APPENDECTOMY    . CARDIAC CATHETERIZATION N/A 07/05/2016   Procedure: Left Heart Cath and Coronary Angiography;  Surgeon: Burnell Blanks,  MD;  Location: [place] [facility];  Service: Cardiovascular;  Laterality: N/A;  . CARDIAC SURGERY     cyst, not on heart.  . CORONARY ARTERY BYPASS GRAFT N/A 07/07/2016   Procedure: CORONARY ARTERY BYPASS GRAFTING (CABG) x 5 with endoscopic harvesting of the right greater saphenous vein;  Surgeon: Gaye Pollack, MD;  Location: [place] [facility];  Service: Open Heart Surgery;  Laterality: N/A;  . CORONARY BALLOON ANGIOPLASTY N/A 02/03/2021   Procedure: CORONARY  BALLOON ANGIOPLASTY;  Surgeon: Wellington Hampshire, MD;  Location: [place] [facility];  Service: Cardiovascular;  Laterality: N/A;  svg to pda  . HEMORRHOID SURGERY    . INTRAOPERATIVE TRANSESOPHAGEAL ECHOCARDIOGRAM N/A 07/07/2016   Procedure: INTRAOPERATIVE TRANSESOPHAGEAL ECHOCARDIOGRAM;  Surgeon: Gaye Pollack, MD;  Location: [facility];  Service: Open Heart Surgery;  Laterality: N/A;  . LEFT HEART CATH AND CORS/GRAFTS ANGIOGRAPHY N/A 02/03/2021   Procedure: LEFT HEART CATH AND CORS/GRAFTS ANGIOGRAPHY;  Surgeon: Wellington Hampshire, MD;  Location: [place] [facility];  Service: Cardiovascular;  Laterality: N/A;  . PERIPHERAL VASCULAR BALLOON ANGIOPLASTY  11/06/2019   Procedure: PERIPHERAL VASCULAR BALLOON ANGIOPLASTY;  Surgeon: Wellington Hampshire, MD;  Location: [place] [facility];  Service: Cardiovascular;;  cutting and dcb  . PERIPHERAL VASCULAR BALLOON ANGIOPLASTY Right 03/31/2021   Procedure: PERIPHERAL VASCULAR BALLOON ANGIOPLASTY;  Surgeon: Wellington Hampshire, MD;  Location: [place] [facility];  Service: Cardiovascular;  Laterality: Right;  SFA  . SHOULDER SURGERY      Prior to Admission medications   Medication Sig Start Date End Date Taking? Authorizing Provider  acetaminophen (TYLENOL) 650 MG CR tablet Take 1,300 mg by mouth in the morning and at bedtime.   Yes [provider]  acyclovir (ZOVIRAX) 400 MG tablet Take 800 mg by mouth daily as needed (fever blisters).    Yes [provider]  alendronate (FOSAMAX) 70 MG tablet Take 70 mg by mouth once a week. 10/16/18  Yes [provider]  aspirin 81 MG chewable tablet Chew 1 tablet (81 mg total) by mouth daily. 02/06/21  Yes Almyra Deforest, PA  clopidogrel (PLAVIX) 75 MG tablet Take 1 tablet (75 mg total) by mouth daily. 02/05/21  Yes Almyra Deforest, PA  diphenhydramine-acetaminophen (TYLENOL PM) 25-500 MG TABS tablet Take 2 tablets by mouth at bedtime.   Yes [provider]  fluticasone (FLONASE) 50 MCG/ACT nasal spray Place 2  sprays into both nostrils daily as needed for allergies.  02/22/18  Yes [provider]  hydrochlorothiazide (HYDRODIURIL) 25 MG tablet Take 25 mg by mouth daily.   Yes [provider]  isosorbide mononitrate (IMDUR) 30 MG 24 hr tablet Take 30 mg by mouth daily.   Yes [provider]  levothyroxine (SYNTHROID, LEVOTHROID) 50 MCG tablet Take 50 mcg by mouth daily before breakfast. 03/31/16  Yes [provider]  metoprolol tartrate (LOPRESSOR) 25 MG tablet Take 0.5 tablets (12.5 mg total) by mouth 2 (two) times daily. 02/23/21 05/24/21 Yes Weaver, Scott T, PA-C  nitroGLYCERIN (NITROSTAT) 0.4 MG SL tablet Place 1 tablet (0.4 mg total) under the tongue every 5 (five) minutes x 3 doses as needed for chest pain. 11/09/19  Yes Sande Rives E, PA-C  pantoprazole (PROTONIX) 20 MG tablet Take 20 mg by mouth daily.   Yes [provider]  pantoprazole (PROTONIX) 40 MG tablet Take 40 mg by mouth daily.   Yes [provider]  rOPINIRole (REQUIP) 2 MG tablet Take 2 mg by mouth at bedtime.  Yes [provider]  traMADol (ULTRAM) 50 MG tablet Take 1 tablet (50 mg total) by mouth every 6 (six) hours as needed for severe pain. Patient taking differently: Take 50 mg by mouth 2 (two) times daily. 07/13/16  Yes Lars Pinks M, PA-C  isosorbide mononitrate (IMDUR) 30 MG 24 hr tablet Take 1 tablet (30 mg total) by mouth daily. Patient not taking: No sig reported 02/06/21   Almyra Deforest, PA    Scheduled Meds: . isosorbide mononitrate  30 mg Oral Daily  . levothyroxine  50 mcg Oral QAC breakfast  . metoprolol tartrate  12.5 mg Oral BID  . pantoprazole (PROTONIX) IV  40 mg Intravenous Q12H  . rOPINIRole  2 mg Oral QHS   Continuous Infusions: PRN Meds:.acetaminophen **OR** acetaminophen, nitroGLYCERIN, ondansetron **OR** ondansetron (ZOFRAN) IV, traMADol  Allergies as of 05/06/2021 - Review Complete 05/06/2021  Allergen Reaction Noted  . Penicillins  Anaphylaxis 07/03/2016  . Lincomycin Swelling and Other (See Comments) 11/20/2015  . Naproxen sodium Other (See Comments) 07/19/2014  . Statins Other (See Comments) 07/19/2014  . Latex Other (See Comments) 07/07/2016  . Tape  07/19/2014    Family History  Problem Relation Age of Onset  . Stroke Mother        Stroke age 28  . CAD Father        died of MI age 2  . Coronary artery disease Sister        one sister had massive MI at 105, another sister has 13 stents  . Coronary artery disease Brother        one brother had MI at age 72 and died at 67, another has had 4 MIs and 2 bypasses    Social History   Socioeconomic History  . Marital status: Married    Spouse name: Not on file  . Number of children: Not on file  . Years of education: Not on file  . Highest education level: Not on file  Occupational History  . Not on file  Tobacco Use  . Smoking status: Never Smoker  . Smokeless tobacco: Never Used  Vaping Use  . Vaping Use: Never used  Substance and Sexual Activity  . Alcohol use: No    Comment: social  . Drug use: No  . Sexual activity: Not on file  Other Topics Concern  . Not on file  Social History Narrative  . Not on file   Social Determinants of Health   Financial Resource Strain: Not on file  Food Insecurity: Not on file  Transportation Needs: Not on file  Physical Activity: Not on file  Stress: Not on file  Social Connections: Not on file  Intimate Partner Violence: Not on file    Review of Systems:  Review of Systems  Constitutional: Positive for malaise/fatigue. Negative for chills and fever.  HENT: Positive for sore throat. Negative for hearing loss.   Eyes: Negative for discharge and redness.  Respiratory: Positive for cough and shortness of breath. Negative for sputum production and wheezing.   Cardiovascular: Negative for chest pain and leg swelling.  Gastrointestinal: Positive for melena and nausea. Negative for abdominal pain, blood in  stool, constipation, diarrhea and vomiting.  Genitourinary: Negative for frequency.  Musculoskeletal: Negative.   Skin: Negative for itching and rash.  Neurological: Positive for headaches. Negative for dizziness and loss of consciousness.  Psychiatric/Behavioral: Negative for memory loss. The patient is not nervous/anxious.      Physical Exam: Vital signs: Vitals:   05/07/21  0509 05/07/21 0757  BP: 123/60 (!) 158/70  Pulse: 78 68  Resp: 17 18  Temp: 98.1 F (36.7 C) 97.8 F (36.6 C)  SpO2: 100% 97%   Last BM Date: 05/05/21 Physical Exam Constitutional:      General: She is not in acute distress.    Appearance: Normal appearance. She is not ill-appearing.  HENT:     Head: Normocephalic.  Eyes:     Conjunctiva/sclera: Conjunctivae normal.  Cardiovascular:     Rate and Rhythm: Normal rate and regular rhythm.     Heart sounds: Normal heart sounds. No murmur heard.   Pulmonary:     Effort: Pulmonary effort is normal.     Breath sounds: Normal breath sounds.  Abdominal:     General: Bowel sounds are normal.     Palpations: Abdomen is soft. There is no mass.     Tenderness: There is no abdominal tenderness. There is no guarding or rebound.  Musculoskeletal:        General: Normal range of motion.  Skin:    General: Skin is warm and dry.  Neurological:     General: No focal deficit present.     Mental Status: She is alert and oriented to person, place, and time.  Psychiatric:        Mood and Affect: Mood normal.        Behavior: Behavior normal.      GI:  Lab Results: Recent Labs    05/06/21 1120 05/06/21 2105 05/07/21 0433  WBC 6.2  --   --   HGB 8.2* 7.4* 9.9*  HCT 26.2* 23.6* 30.0*  PLT 490*  --   --    BMET Recent Labs    05/06/21 1120 05/07/21 0433  NA 137 138  K 4.2 3.5  CL 105 107  CO2 24 22  GLUCOSE 112* 92  BUN 16 13  CREATININE 0.98 0.60  CALCIUM 8.8* 8.7*   LFT Recent Labs    05/06/21 1120  PROT 6.4*  ALBUMIN 3.7  AST 14*   ALT 8  ALKPHOS 36*  BILITOT 0.3   PT/INR No results for input(s): LABPROT, INR in the last 72 hours.  Studies/Results: No results found.  Impression and Plan Anemia Continue to monitor H&H with transfusion as needed to maintain hemoglobin greater than 7.  Melena with history of PUD Patient with previous NSAID induced peptic ulcer disease in 2006/2016, with recent Plavix and aspirin use due to PAD. BUN 16-->13, cr 0.98 Hgb now at 9.9 after receiviing one unit of blood Continue protonix 40 mg IV BID Place patient n.p.o. at midnight will set up for endoscopy tomorrow  I thoroughly discussed the procedure with the patient (at bedside) to include nature of the procedure, alternatives, benefits, and risks (including but not limited to bleeding, infection, perforation, anesthesia/cardiac pulmonary complications).  Patient verbalized understanding and gave verbal consent to proceed with EGD  PAD s/p right SFA angioplasty on 04/02/2021 was on plavix/ASA prior to admission, currently on hold, last dose of plavix was 06/02 AM.   CAD history s/p CABG in 2017   LOS: 0 days   Vladimir Crofts  PA-C 05/07/2021, 8:17 AM  Contact #  [phone]

## 2021-05-07 NOTE — Plan of Care (Signed)
  Problem: Education: Goal: Knowledge of General Education information will improve Description Including pain rating scale, medication(s)/side effects and non-pharmacologic comfort measures Outcome: Progressing   Problem: Health Behavior/Discharge Planning: Goal: Ability to manage health-related needs will improve Outcome: Progressing   

## 2021-05-07 NOTE — Plan of Care (Signed)

## 2021-05-08 ENCOUNTER — Encounter (HOSPITAL_COMMUNITY)
Admission: EM | Disposition: A | Discharge status: Home / Self Care | Payer: Self-pay | Source: Home / Self Care | Attending: Emergency Medicine

## 2021-05-08 ENCOUNTER — Encounter (HOSPITAL_COMMUNITY): Payer: Self-pay | Admitting: Family Medicine

## 2021-05-08 ENCOUNTER — Observation Stay (HOSPITAL_COMMUNITY): Payer: Medicare Other | Admitting: Certified Registered Nurse Anesthetist

## 2021-05-08 DIAGNOSIS — K269 Duodenal ulcer, unspecified as acute or chronic, without hemorrhage or perforation: Secondary | ICD-10-CM | POA: Diagnosis not present

## 2021-05-08 DIAGNOSIS — U071 COVID-19: Secondary | ICD-10-CM | POA: Diagnosis not present

## 2021-05-08 DIAGNOSIS — I2510 Atherosclerotic heart disease of native coronary artery without angina pectoris: Secondary | ICD-10-CM | POA: Diagnosis not present

## 2021-05-08 DIAGNOSIS — E039 Hypothyroidism, unspecified: Secondary | ICD-10-CM | POA: Diagnosis not present

## 2021-05-08 DIAGNOSIS — K921 Melena: Secondary | ICD-10-CM | POA: Diagnosis not present

## 2021-05-08 HISTORY — PX: ESOPHAGOGASTRODUODENOSCOPY (EGD) WITH PROPOFOL: SHX5813

## 2021-05-08 LAB — CBC WITH DIFFERENTIAL/PLATELET
Abs Immature Granulocytes: 0.01 10*3/uL (ref 0.00–0.07)
Basophils Absolute: 0 10*3/uL (ref 0.0–0.1)
Basophils Relative: 1 %
Eosinophils Absolute: 0.5 10*3/uL (ref 0.0–0.5)
Eosinophils Relative: 7 %
HCT: 30.9 % — ABNORMAL LOW (ref 36.0–46.0)
Hemoglobin: 10 g/dL — ABNORMAL LOW (ref 12.0–15.0)
Immature Granulocytes: 0 %
Lymphocytes Relative: 27 %
Lymphs Abs: 1.9 10*3/uL (ref 0.7–4.0)
MCH: 29.3 pg (ref 26.0–34.0)
MCHC: 32.4 g/dL (ref 30.0–36.0)
MCV: 90.6 fL (ref 80.0–100.0)
Monocytes Absolute: 0.7 10*3/uL (ref 0.1–1.0)
Monocytes Relative: 10 %
Neutro Abs: 3.9 10*3/uL (ref 1.7–7.7)
Neutrophils Relative %: 55 %
Platelets: 384 10*3/uL (ref 150–400)
RBC: 3.41 MIL/uL — ABNORMAL LOW (ref 3.87–5.11)
RDW: 14.7 % (ref 11.5–15.5)
WBC: 7 10*3/uL (ref 4.0–10.5)
nRBC: 0 % (ref 0.0–0.2)

## 2021-05-08 LAB — BASIC METABOLIC PANEL
Anion gap: 6 (ref 5–15)
BUN: 12 mg/dL (ref 8–23)
CO2: 25 mmol/L (ref 22–32)
Calcium: 8.7 mg/dL — ABNORMAL LOW (ref 8.9–10.3)
Chloride: 106 mmol/L (ref 98–111)
Creatinine, Ser: 0.52 mg/dL (ref 0.44–1.00)
GFR, Estimated: >60 mL/min (ref >60)
Glucose, Bld: 110 mg/dL — ABNORMAL HIGH (ref 70–99)
Potassium: 3.7 mmol/L (ref 3.5–5.1)
Sodium: 137 mmol/L (ref 135–145)

## 2021-05-08 SURGERY — ESOPHAGOGASTRODUODENOSCOPY (EGD) WITH PROPOFOL
Anesthesia: Monitor Anesthesia Care

## 2021-05-08 MED ORDER — PANTOPRAZOLE SODIUM 40 MG PO TBEC: 40.0000 mg | DELAYED_RELEASE_TABLET | Freq: Every day | ORAL | Status: DC

## 2021-05-08 MED ORDER — PROPOFOL 10 MG/ML IV BOLUS
INTRAVENOUS | Status: DC | PRN
  Administered 2021-05-08: 20 mg via INTRAVENOUS

## 2021-05-08 MED ORDER — LACTATED RINGERS IV SOLN: INTRAVENOUS | Status: DC

## 2021-05-08 MED ORDER — PROPOFOL 500 MG/50ML IV EMUL
INTRAVENOUS | Status: AC
  Filled 2021-05-08: qty 50

## 2021-05-08 MED ORDER — CLOPIDOGREL BISULFATE 75 MG PO TABS: 75.0000 mg | ORAL_TABLET | Freq: Every day | ORAL | 3 refills | Status: DC

## 2021-05-08 MED ORDER — PROPOFOL 500 MG/50ML IV EMUL
INTRAVENOUS | Status: DC | PRN
  Administered 2021-05-08: 100 ug/kg/min via INTRAVENOUS

## 2021-05-08 MED ORDER — LIDOCAINE 2% (20 MG/ML) 5 ML SYRINGE
INTRAMUSCULAR | Status: DC | PRN
  Administered 2021-05-08: 80 mg via INTRAVENOUS

## 2021-05-08 SURGICAL SUPPLY — 15 items

## 2021-05-08 NOTE — Evaluation (Signed)
Physical Therapy Evaluation-1x Patient Details Name: Katherine Hall MRN: [id number] DOB: 1941/09/11 Today's Date: 05/08/2021   History of Present Illness  80 yo female admitted with GI bleed. Recent COVID diagnosis-no longer on precautions. Hx of gastric ulcer, CAD, CABG, PAD, s/p angioplasty 03/2021  Clinical Impression  On eval, pt was Supv-Min guard assist for mobility. She walked ~200 feet around the unit. She also climbed 2 steps with use of handrail. Pt tolerated activity well. She is mildly unsteady intermittently. She is set to d/c home with her husband. She politely declines any PT f/u. She feels like she is near her baseline. Husband present during session. 1x eval. Will sign off.     Follow Up Recommendations No PT follow up;Supervision for mobility/OOB    Equipment Recommendations  None recommended by PT    Recommendations for Other Services       Precautions / Restrictions Precautions Precautions: Fall Restrictions Weight Bearing Restrictions: No      Mobility  Bed Mobility Overal bed mobility: Modified Independent                  Transfers Overall transfer level: Modified independent                  Ambulation/Gait Ambulation/Gait assistance: Min guard Gait Distance (Feet): 200 Feet Assistive device: None Gait Pattern/deviations: Step-through pattern;Decreased stride length     General Gait Details: Intermittent unsteadiness. No overt LOB. Pt denies dizziness.  Stairs            Wheelchair Mobility    Modified Rankin (Stroke Patients Only)       Balance Overall balance assessment: Mild deficits observed, not formally tested                                           Pertinent Vitals/Pain Pain Assessment: No/denies pain    Home Living Family/patient expects to be discharged to:: Private residence   Available Help at Discharge: Family Type of Home: House Home Access: Stairs to enter   Engineer, site of Steps: 1 Home Layout: One level Home Equipment: Environmental consultant - 2 wheels;Cane - single point      Prior Function Level of Independence: Independent         Comments: still driving     Hand Dominance        Extremity/Trunk Assessment   Upper Extremity Assessment Upper Extremity Assessment: Overall WFL for tasks assessed    Lower Extremity Assessment Lower Extremity Assessment: Overall WFL for tasks assessed    Cervical / Trunk Assessment Cervical / Trunk Assessment: Normal  Communication   Communication: No difficulties  Cognition Arousal/Alertness: Awake/alert Behavior During Therapy: WFL for tasks assessed/performed Overall Cognitive Status: Within Functional Limits for tasks assessed                                        General Comments      Exercises     Assessment/Plan    PT Assessment Patent does not need any further PT services  PT Problem List         PT Treatment Interventions      PT Goals (Current goals can be found in the Care Plan section)  Acute Rehab PT Goals Patient Stated Goal: home today PT Goal Formulation:  All assessment and education complete, DC therapy    Frequency     Barriers to discharge        Co-evaluation               AM-PAC PT "6 Clicks" Mobility  Outcome Measure Help needed turning from your back to your side while in a flat bed without using bedrails?: None Help needed moving from lying on your back to sitting on the side of a flat bed without using bedrails?: None Help needed moving to and from a bed to a chair (including a wheelchair)?: None Help needed standing up from a chair using your arms (e.g., wheelchair or bedside chair)?: None Help needed to walk in hospital room?: A Little Help needed climbing 3-5 steps with a railing? : A Little 6 Click Score: 22    End of Session   Activity Tolerance: Patient tolerated treatment well Patient left: with family/visitor present  (in room with husband)        Time: 7373-6681 PT Time Calculation (min) (ACUTE ONLY): 9 min   Charges:   PT Evaluation $PT Eval Low Complexity: [place], PT Acute Rehabilitation  Office: [phone] Pager: [phone]

## 2021-05-08 NOTE — Care Management Obs Status (Signed)
[facility] NOTIFICATION   Patient Details  Name: Katherine Hall MRN: [id number] Date of Birth: Apr 17, 1941   Medicare Observation Status Notification Given:  Yes    Purcell Mouton, RN 05/08/2021, 10:37 AM

## 2021-05-08 NOTE — Op Note (Signed)
[facility] Patient Name: Katherine Hall Procedure Date: 05/08/2021 MRN: [id number] Attending MD: Arta Silence , MD Date of Birth: October 19, 1941 CSN: [id number] Age: 81 Admit Type: Inpatient Procedure:                Upper GI endoscopy Indications:              Acute post hemorrhagic anemia, Melena Providers:                Arta Silence, MD, Jerene Pitch Person, Elspeth Cho                            Tech., Technician, Courtney Heys. [place], CRNA Referring MD:             [facility]. Medicines:                Monitored Anesthesia Care Complications:            No immediate complications. Estimated Blood Loss:     Estimated blood loss: none. Procedure:                Pre-Anesthesia Assessment:                           - Prior to the procedure, a History and Physical                            was performed, and patient medications and                            allergies were reviewed. The patient's tolerance of                            previous anesthesia was also reviewed. The risks                            and benefits of the procedure and the sedation                            options and risks were discussed with the patient.                            All questions were answered, and informed consent                            was obtained. Prior Anticoagulants: The patient has                            taken Plavix (clopidogrel), last dose was 2 days                            prior to procedure. ASA Grade Assessment: III - A                            patient with severe systemic disease. After  reviewing the risks and benefits, the patient was                            deemed in satisfactory condition to undergo the                            procedure.                           After obtaining informed consent, the endoscope was                            passed under direct vision. Throughout the                             procedure, the patient's blood pressure, pulse, and                            oxygen saturations were monitored continuously. The                            GIF-H190 ([id number]) Olympus gastroscope was                            introduced through the mouth, and advanced to the                            second part of duodenum. The upper GI endoscopy was                            accomplished without difficulty. The patient                            tolerated the procedure well. Scope In: Scope Out: Findings:      A medium-sized hiatal hernia was present.      The exam of the esophagus was otherwise normal.      Fibrotic and reparative changes, with anatomic distortion, of antrum;       most likely indicative of healing changes from prior ulcer disease.      A few localized small erosions with no stigmata of recent bleeding were       found in the prepyloric region of the stomach.      The exam of the stomach was otherwise normal.      A few diffuse erosions without bleeding were found in the first portion       of the duodenum and in the second portion of the duodenum.      The exam of the duodenum was otherwise normal.      No old or fresh blood was seen to the extent of our examination. Impression:               - Medium-sized hiatal hernia.                           - Erosive gastropathy with no stigmata of recent  bleeding.                           - Duodenal erosions without bleeding.                           - No active bleeding, but any of above findings                            could lead to GI bleeding. Moderate Sedation:      Not Applicable - Patient had care per Anesthesia. Recommendation:           - Return patient to hospital ward for ongoing care.                           - Soft diet today.                           - Hold Plavix for 5 days; baby ASA ok.                           - Use Protonix (pantoprazole) 40 mg PO daily                             indefinitely.                           [facility] will sign-off; patient should follow-up                            with outpatient GI MDs at [facility]; please call with any questions; thank you                            for the consultation. Procedure Code(s):        --- Professional ---                           [phone], Esophagogastroduodenoscopy, flexible,                            transoral; diagnostic, including collection of                            specimen(s) by brushing or washing, when performed                            (separate procedure) Diagnosis Code(s):        --- Professional ---                           K44.9, Diaphragmatic hernia without obstruction or  gangrene                           K31.89, Other diseases of stomach and duodenum                           K26.9, Duodenal ulcer, unspecified as acute or                            chronic, without hemorrhage or perforation                           D62, Acute posthemorrhagic anemia                           K92.1, Melena (includes Hematochezia) CPT copyright 2019 American Medical Association. All rights reserved. The codes documented in this report are preliminary and upon coder review may  be revised to meet current compliance requirements. Arta Silence, MD 05/08/2021 1:20:06 PM This report has been signed electronically. Number of Addenda: 0

## 2021-05-08 NOTE — Transfer of Care (Signed)
Immediate Anesthesia Transfer of Care Note  Patient: Katherine Hall  Procedure(s) Performed: ESOPHAGOGASTRODUODENOSCOPY (EGD) WITH PROPOFOL (N/A )  Patient Location: PACU  Anesthesia Type:MAC  Level of Consciousness: awake, alert  and patient cooperative  Airway & Oxygen Therapy: Patient Spontanous Breathing and Patient connected to face mask oxygen  Post-op Assessment: Report given to RN and Post -op Vital signs reviewed and stable  Post vital signs: Reviewed and stable  Last Vitals:  Vitals Value Taken Time  BP 100/52 05/08/21 1317  Temp    Pulse 58 05/08/21 1318  Resp 21 05/08/21 1318  SpO2 100 % 05/08/21 1318  Vitals shown include unvalidated device data.  Last Pain:  Vitals:   05/08/21 1244  TempSrc: Oral  PainSc: 0-No pain      Patients Stated Pain Goal: 2 (59/16/38 4665)  Complications: No complications documented.

## 2021-05-08 NOTE — Discharge Summary (Signed)
Physician Discharge Summary  Katherine Hall PFX:[id number] DOB: 14-Mar-1941 DOA: 05/06/2021  PCP: Stacie Glaze, DO  Admit date: 05/06/2021 Discharge date: 05/08/2021  Admitted From: Home Disposition:  Home  Discharge Condition:Stable CODE STATUS:FULL Diet recommendation: Soft diet   Brief/Interim Summary:  Patient is 80 year old female with history of coronary disease status post CABG in 2017, hypothyroidism, peptic ulcer disease, peripheral artery disease status post right SFA angioplasty on 04/02/2021 now presenting to the emerged part with complaints of lightheadedness, near syncope, dark stool and was found to have hemoglobin of 7.4 on outpatient work-up, down from 10.7 3 weeks ago.  Patient was experiencing fatigue, and reported black stools for roughly 3 weeks.  No abdominal pain but had some nausea.  Patient takes aspirin and Plavix daily since right SFA angioplasty in April.  She is also recently recovering from shingles involving her right trunk, not currently active.  On presentation she was hemodynamically stable.  Hemoglobin was 8.2.  FOBT was positive.  GI consulted.  Underwent EGD without finding of any active source of bleeding.  Medically stable for discharge to home today.  GI cleared for discharge.  Following problems were addressed during her hospitalization:  Symptomatic anemia/upper GI bleed: Presented with melena, lightheadedness. Hemoglobin was 10.7 on May 11/22, down to 7.4 recently.  Patient was having black tarry stools.  FOBT positive.  Patient takes Plavix and aspirin for peripheral vascular disease. Started on PPI IV every 12.  GI consulted.  Hemoglobin in the range of 10 today.  No pulses of significant melena or bloody stools after hospitalization.  Underwent EGD today with finding of moderate size hiatal hernia, erosive gastropathy, duodenal erosions but no active bleeding anywhere.  GI recommended to continue Protonix 40 mg daily.  She can start aspirin but needs to  hold Plavix for 5 more days.  We recommend to follow-up with PCP in a week and do a CBC just to check her hemoglobin.  Coronary artery disease/peripheral artery disease: History of CABG in 2017, cardiac cath in March 2022 with unsuccessful angioplasty of SVG to right PDA.  Status post right SFA angioplasty in April 22. On aspirin and Plavix at home   On nitrates, beta-blocker.  Hypothyroidism: Continue Synthyroid  Recent COVID infection: Patient developed upper respiratory symptoms on 5/13, tested positive for COVID on 5/19, no longer in isolation  Generalized weakness: PT consulted,no follow up recommended  Discharge Diagnoses:  Principal Problem:   GI bleed Active Problems:   Hypothyroidism   Coronary artery disease involving native coronary artery of native heart without angina pectoris   Peripheral arterial disease [facility])    Discharge Instructions  Discharge Instructions    Diet - low sodium heart healthy   Complete by: As directed    Discharge instructions   Complete by: As directed    1)Take prescribed medication as instructed 2) follow up with your PCP in a week.  Do a CBC test during the follow-up 3)Resume Plavix only on 05/13/2021 4)Take soft diet today   Increase activity slowly   Complete by: As directed      Allergies as of 05/08/2021      Reactions   Penicillins Anaphylaxis   Did it involve swelling of the face/tongue/throat, SOB, or low BP? Yes Did it involve sudden or severe rash/hives, skin peeling, or any reaction on the inside of your mouth or nose? No Did you need to seek medical attention at a hospital or doctor's office? Yes When did it last happen?10+ years If  all above answers are "NO", may proceed with cephalosporin use.   Lincomycin Swelling, Other (See Comments)   passed out   Naproxen Sodium Other (See Comments)   Stomach ulcers   Statins Other (See Comments)   Myalgias   Latex Other (See Comments)   Blisters when in contact with skin    Tape    blistering      Medication List    TAKE these medications   acetaminophen 650 MG CR tablet Commonly known as: TYLENOL Take 1,300 mg by mouth in the morning and at bedtime.   acyclovir 400 MG tablet Commonly known as: ZOVIRAX Take 800 mg by mouth daily as needed (fever blisters).   alendronate 70 MG tablet Commonly known as: FOSAMAX Take 70 mg by mouth once a week.   aspirin 81 MG chewable tablet Chew 1 tablet (81 mg total) by mouth daily.   clopidogrel 75 MG tablet Commonly known as: Plavix Take 1 tablet (75 mg total) by mouth daily. Resume only after 5 days ( on 05/13/21) What changed: additional instructions   diphenhydramine-acetaminophen 25-500 MG Tabs tablet Commonly known as: TYLENOL PM Take 2 tablets by mouth at bedtime.   fluticasone 50 MCG/ACT nasal spray Commonly known as: FLONASE Place 2 sprays into both nostrils daily as needed for allergies.   hydrochlorothiazide 25 MG tablet Commonly known as: HYDRODIURIL Take 25 mg by mouth daily.   isosorbide mononitrate 30 MG 24 hr tablet Commonly known as: IMDUR Take 30 mg by mouth daily. What changed: Another medication with the same name was removed. Continue taking this medication, and follow the directions you see here.   levothyroxine 50 MCG tablet Commonly known as: SYNTHROID Take 50 mcg by mouth daily before breakfast.   metoprolol tartrate 25 MG tablet Commonly known as: LOPRESSOR Take 0.5 tablets (12.5 mg total) by mouth 2 (two) times daily.   nitroGLYCERIN 0.4 MG SL tablet Commonly known as: NITROSTAT Place 1 tablet (0.4 mg total) under the tongue every 5 (five) minutes x 3 doses as needed for chest pain.   pantoprazole 40 MG tablet Commonly known as: PROTONIX Take 40 mg by mouth daily. What changed: Another medication with the same name was removed. Continue taking this medication, and follow the directions you see here.   rOPINIRole 2 MG tablet Commonly known as: REQUIP Take 2 mg by  mouth at bedtime.   traMADol 50 MG tablet Commonly known as: ULTRAM Take 1 tablet (50 mg total) by mouth every 6 (six) hours as needed for severe pain. What changed: when to take this       Follow-up [place], Daniel Lewis, DO. Schedule an appointment as soon as possible for a visit in 1 week(s).   Specialty: Family Medicine Contact information: [address] [place] [address] [phone]              Allergies  Allergen Reactions  . Penicillins Anaphylaxis    Did it involve swelling of the face/tongue/throat, SOB, or low BP? Yes Did it involve sudden or severe rash/hives, skin peeling, or any reaction on the inside of your mouth or nose? No Did you need to seek medical attention at a hospital or doctor's office? Yes When did it last happen?10+ years If all above answers are "NO", may proceed with cephalosporin use.    . Lincomycin Swelling and Other (See Comments)    passed out  . Naproxen Sodium Other (See Comments)  Stomach ulcers  . Statins Other (See Comments)    Myalgias  . Latex Other (See Comments)    Blisters when in contact with skin  . Tape     blistering    Consultations:  GI   Procedures/Studies: CT Angio Chest PE W and/or Wo Contrast  Result Date: 04/14/2021 CLINICAL DATA:  Near syncopal episode. EXAM: CT ANGIOGRAPHY CHEST WITH CONTRAST TECHNIQUE: Multidetector CT imaging of the chest was performed using the standard protocol during bolus administration of intravenous contrast. Multiplanar CT image reconstructions and MIPs were obtained to evaluate the vascular anatomy. CONTRAST:  12mL OMNIPAQUE IOHEXOL 350 MG/ML SOLN, <See Chart> OMNIPAQUE IOHEXOL 300 MG/ML SOLN COMPARISON:  Plain film of 1 day prior FINDINGS: Cardiovascular: The quality of this exam for evaluation of pulmonary embolism is good. No evidence of pulmonary embolism. Aortic atherosclerosis. Mild cardiomegaly with median sternotomy for CABG.  Mediastinum/Nodes: No mediastinal or hilar adenopathy. Moderate hiatal hernia. Lungs/Pleura: Trace left pleural fluid or thickening. 4 mm ground-glass nodule on 48/5. Upper Abdomen: Incompletely imaged right hepatic lobe 1.6 cm hypoattenuating lesion is favored to represent a cyst. Normal imaged portions of the gallbladder, spleen, right adrenal gland, kidneys. Left adrenal thickening. Mild pancreatic atrophy. Mild to moderate pancreatic duct dilatation including at 5 mm within the body on 86/4. New compared to 11/07/2019. The pancreatic head is incompletely imaged and the pancreas is suboptimally imagds secondary to bolus timing on this nondedicated study. Musculoskeletal: Degenerative changes of both shoulders. Mild S shaped thoracic spine curvature. Review of the MIP images confirms the above findings. IMPRESSION: 1. No evidence of pulmonary embolism. 2. Moderate hiatal hernia. 3. 4 mm right upper lobe ground-glass nodule. This does not warrant follow-up per consensus criteria. This recommendation follows the consensus statement: Guidelines for Management of Incidental Pulmonary Nodules Detected on CT Images:From the Fleischner Society 2017; published online before print (10.1148/radiol.3704888916). 4. Trace left pleural fluid or thickening. 5. Aortic Atherosclerosis (ICD10-I70.0). 6. Pancreatic duct dilatation, suboptimally evaluated. Felt to be new or increased compared to most recent abdominal CT. Consider dedicated pancreatic protocol CT or pre and post contrast abdominal MRI. If MRI is considered, this should be withheld until patient can follow directions and breath hold. Electronically Signed   By: Abigail Miyamoto M.D.   On: 04/14/2021 15:41   DG Chest Portable 1 View  Result Date: 04/14/2021 CLINICAL DATA:  Near syncopal episode. EXAM: PORTABLE CHEST 1 VIEW COMPARISON:  February 02, 2021 FINDINGS: Stable postsurgical changes from CABG. Cardiomediastinal silhouette is normal. Mediastinal contours appear  intact. Calcific atherosclerotic disease and tortuosity of the aorta. There is no evidence of focal airspace consolidation, pleural effusion or pneumothorax. Osseous structures are without acute abnormality. Soft tissues are grossly normal. IMPRESSION: No active disease. Electronically Signed   By: Fidela Salisbury M.D.   On: 04/14/2021 14:48   VAS [place] ABI WITH/WO TBI  Result Date: 04/13/2021  LOWER EXTREMITY DOPPLER STUDY Patient Name:  DARYL QUIROS  Date of Exam:   04/13/2021 Medical Rec #: [id number]      Accession #:    [id number] Date of Birth: [birth year]       Patient Gender: F Patient Age:   69Y Exam Location:  [facility] Procedure:      VAS [place] ABI WITH/WO TBI Referring Phys: [place] --------------------------------------------------------------------------------  Indications: S/P right SFA angioplasty on 03/31/21. Patient reports improvement              since the procedure and is so glad to be able  to walk again without              significant pain. High Risk Factors: Hypertension, hyperlipidemia, no history of smoking. Other Factors: S/P CABG X 5.  Vascular Interventions: S/P drug-coated balloon angioplasty to the right SFA on                         03/31/21.                         Drug-coated balloon angioplasty to the left mid SFA on                         11/06/19. Comparison Study: Previous ABIs performed 03/09/21 were 0.70 on the right and 0.86                   on the left. Performing Technologist: Mariane Masters RVT  Examination Guidelines: A complete evaluation includes at minimum, Doppler waveform signals and systolic blood pressure reading at the level of bilateral brachial, anterior tibial, and posterior tibial arteries, when vessel segments are accessible. Bilateral testing is considered an integral part of a complete examination. Photoelectric Plethysmograph (PPG) waveforms and toe systolic pressure readings are included as required and additional duplex testing as needed.  Limited examinations for reoccurring indications may be performed as noted.  ABI Findings: +---------+------------------+-----+----------+--------+ Right    Rt Pressure (mmHg)IndexWaveform  Comment  +---------+------------------+-----+----------+--------+ Brachial 110                                       +---------+------------------+-----+----------+--------+ ATA      118               1.04 biphasic           +---------+------------------+-----+----------+--------+ PTA      109               0.96 monophasic         +---------+------------------+-----+----------+--------+ PERO     115               1.02 biphasic           +---------+------------------+-----+----------+--------+ Great Toe72                0.64 Abnormal           +---------+------------------+-----+----------+--------+ +---------+------------------+-----+----------+-------+ Left     Lt Pressure (mmHg)IndexWaveform  Comment +---------+------------------+-----+----------+-------+ Brachial 113                                      +---------+------------------+-----+----------+-------+ ATA      116               1.03 biphasic          +---------+------------------+-----+----------+-------+ PTA      59                0.52 monophasic        +---------+------------------+-----+----------+-------+ PERO     114               1.01 biphasic          +---------+------------------+-----+----------+-------+ Great Toe96                0.85 Normal            +---------+------------------+-----+----------+-------+ +-------+-----------+-----------+------------+------------+  ABI/TBIToday's ABIToday's TBIPrevious ABIPrevious TBI +-------+-----------+-----------+------------+------------+ Right  1.04       0.64       0.70        0.41         +-------+-----------+-----------+------------+------------+ Left   1.03       0.85       0.86        0.84          +-------+-----------+-----------+------------+------------+ Bilateral ABIs appear increased compared to prior study on 03/09/21.  Summary: Right: Resting right ankle-brachial index is within normal range. No evidence of significant right lower extremity arterial disease. The right toe-brachial index is abnormal. Left: Resting left ankle-brachial index is within normal range. No evidence of significant left lower extremity arterial disease. The left toe-brachial index is normal.  *See table(s) above for measurements and observations. See arterial duplex report.  Suggest follow up study in 6 months. Electronically signed by Jenkins Rouge MD on 04/13/2021 at 10:58:05 AM.    Final    VAS [place] LOWER EXTREMITY ARTERIAL DUPLEX  Result Date: 04/13/2021 LOWER EXTREMITY ARTERIAL DUPLEX STUDY Patient Name:  CHARLOTTA LAPAGLIA  Date of Exam:   04/13/2021 Medical Rec #: [id number]      Accession #:    [id number] Date of Birth: 10-09-1941       Patient Gender: F Patient Age:   61Y Exam Location:  [facility] Procedure:      VAS [place] LOWER EXTREMITY ARTERIAL DUPLEX Referring Phys: [id number] [place] --------------------------------------------------------------------------------  Indications: S/P right SFA angioplasty on 03/31/21. Patient reports improvement              since the procedure, and is so glad to be able to walk again              without significant pain. High Risk Factors: Hypertension, hyperlipidemia, no history of smoking. Other Factors: S/P CABG X 5.  Vascular Interventions: S/P drug-coated balloon angioplasty to the right SFA on                         03/31/21. Drug-coated balloon angioplasty to the left mid                         SFA on 11/06/19. Current ABI:            Today's ABIs are 1.04 on the right and 1.03 on the left. Comparison Study: Previous arterial duplex on 03/09/21 showed 75-99% proximal                   right SFA stenosis with one-vessel runoff via anterior tibial                   artery and 30-49% left SFA  mid stenosis, 50-74% distal left                   SFA stenosis. Performing Technologist: Mariane Masters RVT  Examination Guidelines: A complete evaluation includes B-mode imaging, spectral Doppler, color Doppler, and power Doppler as needed of all accessible portions of each vessel. Bilateral testing is considered an integral part of a complete examination. Limited examinations for reoccurring indications may be performed as noted.  +----------+--------+-----+--------+--------+--------+ RIGHT     PSV cm/sRatioStenosisWaveformComments +----------+--------+-----+--------+--------+--------+ CFA Prox  118                  biphasic         +----------+--------+-----+--------+--------+--------+ DFA  240                  biphasic         +----------+--------+-----+--------+--------+--------+ SFA Prox  121                  biphasic         +----------+--------+-----+--------+--------+--------+ SFA Mid   67                   biphasic         +----------+--------+-----+--------+--------+--------+ SFA Distal125                  biphasic         +----------+--------+-----+--------+--------+--------+ POP Prox  149                  biphasic         +----------+--------+-----+--------+--------+--------+ POP Distal41                   biphasic         +----------+--------+-----+--------+--------+--------+ TP Trunk  97                   biphasic         +----------+--------+-----+--------+--------+--------+ Mild atherosclerosis without evidence of focal stenosis, s/p angioplasty. Right pedal acceleration time is 67 ms, which is category 1 normal/no ischemia range.   Summary: Right: Mild-moderate atherosclerosis, without evidence of stenosis s/p right SFA angioplasty. Marked improvement is noted compared to previous exam.  See table(s) above for measurements and observations. See ABI report. Suggest follow up study in 6 months. Electronically signed by Jenkins Rouge MD on 04/13/2021 at 1:10:37 PM.    Final       Subjective: Patient seen and examined the bedside this morning.  Hemodynamically stable for discharge.   Discharge Exam: Vitals:   05/08/21 1350 05/08/21 1416  BP: (!) 149/83 130/61  Pulse: 72 64  Resp: 20 20  Temp:  97.9 F (36.6 C)  SpO2: 98% 98%   Vitals:   05/08/21 1330 05/08/21 1340 05/08/21 1350 05/08/21 1416  BP: (!) 143/63 (!) 155/59 (!) 149/83 130/61  Pulse: 67 67 72 64  Resp: 15 18 20 20   Temp:    97.9 F (36.6 C)  TempSrc:    Oral  SpO2: 98% 100% 98% 98%  Weight:      Height:        General: Pt is alert, awake, not in acute distress Cardiovascular: RRR, S1/S2 +, no rubs, no gallops Respiratory: CTA bilaterally, no wheezing, no rhonchi Abdominal: Soft, NT, ND, bowel sounds + Extremities: no edema, no cyanosis    The results of significant diagnostics from this hospitalization (including imaging, microbiology, ancillary and laboratory) are listed below for reference.     Microbiology: Recent Results (from the past 240 hour(s))  Resp Panel by RT-PCR (Flu A&B, Covid) Nasopharyngeal Swab     Status: Abnormal   Collection Time: 05/06/21 12:44 PM   Specimen: Nasopharyngeal Swab; Nasopharyngeal(NP) swabs in vial transport medium  Result Value Ref Range Status   SARS Coronavirus 2 by RT PCR POSITIVE (A) NEGATIVE Final    Comment: RESULT CALLED TO, READ BACK BY AND VERIFIED WITH: [place] RN @1403  05/06/2021 OLSONM (NOTE) SARS-CoV-2 target nucleic acids are DETECTED.  The SARS-CoV-2 RNA is generally detectable in upper respiratory specimens during the acute phase of infection. Positive results are indicative of the presence of the identified virus, but do not rule out bacterial  infection or co-infection with other pathogens not detected by the test. Clinical correlation with patient history and other diagnostic information is necessary to determine patient infection status. The expected result is  Negative.  Fact Sheet for Patients: EntrepreneurPulse.com.au  Fact Sheet for Healthcare Providers: IncredibleEmployment.be  This test is not yet approved or cleared by the [place] FDA and  has been authorized for detection and/or diagnosis of SARS-CoV-2 by FDA under an Emergency Use Authorization (EUA).  This EUA will remain in effect (meaning this test  can be used) for the duration of  the COVID-19 declaration under Section 564(b)(1) of the Act, 21 U.S.C. section 360bbb-3(b)(1), unless the authorization is terminated or revoked sooner.     Influenza A by PCR NEGATIVE NEGATIVE Final   Influenza B by PCR NEGATIVE NEGATIVE Final    Comment: (NOTE) The Xpert Xpress SARS-CoV-2/FLU/RSV plus assay is intended as an aid in the diagnosis of influenza from Nasopharyngeal swab specimens and should not be used as a sole basis for treatment. Nasal washings and aspirates are unacceptable for Xpert Xpress SARS-CoV-2/FLU/RSV testing.  Fact Sheet for Patients: EntrepreneurPulse.com.au  Fact Sheet for Healthcare Providers: IncredibleEmployment.be  This test is not yet approved or cleared by the [place] FDA and has been authorized for detection and/or diagnosis of SARS-CoV-2 by FDA under an Emergency Use Authorization (EUA). This EUA will remain in effect (meaning this test can be used) for the duration of the COVID-19 declaration under Section 564(b)(1) of the Act, 21 U.S.C. section 360bbb-3(b)(1), unless the authorization is terminated or revoked.  Performed at [facility], [address] [place], [place] [address]      Labs: BNP (last 3 results) No results for input(s): BNP in the last 8760 hours. Basic Metabolic Panel: Recent Labs  Lab 05/06/21 1120 05/07/21 0433 05/08/21 0322  NA 137 138 137  K 4.2 3.5 3.7  CL 105 107 106  CO2 24 22 25   GLUCOSE 112* 92 110*  BUN 16 13 12    CREATININE 0.98 0.60 0.52  CALCIUM 8.8* 8.7* 8.7*   Liver Function Tests: Recent Labs  Lab 05/06/21 1120  AST 14*  ALT 8  ALKPHOS 36*  BILITOT 0.3  PROT 6.4*  ALBUMIN 3.7   No results for input(s): LIPASE, AMYLASE in the last 168 hours. No results for input(s): AMMONIA in the last 168 hours. CBC: Recent Labs  Lab 05/06/21 1120 05/06/21 2105 05/07/21 0433 05/08/21 0322  WBC 6.2  --   --  7.0  NEUTROABS 3.5  --   --  3.9  HGB 8.2* 7.4* 9.9* 10.0*  HCT 26.2* 23.6* 30.0* 30.9*  MCV 93.9  --   --  90.6  PLT 490*  --   --  384   Cardiac Enzymes: No results for input(s): CKTOTAL, CKMB, CKMBINDEX, TROPONINI in the last 168 hours. BNP: Invalid input(s): POCBNP CBG: No results for input(s): GLUCAP in the last 168 hours. D-Dimer No results for input(s): DDIMER in the last 72 hours. Hgb A1c No results for input(s): HGBA1C in the last 72 hours. Lipid Profile No results for input(s): CHOL, HDL, LDLCALC, TRIG, CHOLHDL, LDLDIRECT in the last 72 hours. Thyroid function studies No results for input(s): TSH, T4TOTAL, T3FREE, THYROIDAB in the last 72 hours.  Invalid input(s): FREET3 Anemia work up No results for input(s): VITAMINB12, FOLATE, FERRITIN, TIBC, IRON, RETICCTPCT in the last 72 hours. Urinalysis    Component Value Date/Time   COLORURINE YELLOW 04/14/2021 1406   APPEARANCEUR HAZY (  A) 04/14/2021 1406   LABSPEC >1.030 (H) 04/14/2021 1406   PHURINE 5.5 04/14/2021 1406   GLUCOSEU NEGATIVE 04/14/2021 1406   [place] 04/14/2021 1406   [place] 04/14/2021 1406   [place] 04/14/2021 1406   PROTEINUR NEGATIVE 04/14/2021 1406   NITRITE NEGATIVE 04/14/2021 1406   [place] 04/14/2021 1406   Sepsis Labs Invalid input(s): PROCALCITONIN,  WBC,  LACTICIDVEN Microbiology Recent Results (from the past 240 hour(s))  Resp Panel by RT-PCR (Flu A&B, Covid) Nasopharyngeal Swab     Status: Abnormal   Collection Time: 05/06/21 12:44 PM    Specimen: Nasopharyngeal Swab; Nasopharyngeal(NP) swabs in vial transport medium  Result Value Ref Range Status   SARS Coronavirus 2 by RT PCR POSITIVE (A) NEGATIVE Final    Comment: RESULT CALLED TO, READ BACK BY AND VERIFIED WITH: [place] RN @1403  05/06/2021 OLSONM (NOTE) SARS-CoV-2 target nucleic acids are DETECTED.  The SARS-CoV-2 RNA is generally detectable in upper respiratory specimens during the acute phase of infection. Positive results are indicative of the presence of the identified virus, but do not rule out bacterial infection or co-infection with other pathogens not detected by the test. Clinical correlation with patient history and other diagnostic information is necessary to determine patient infection status. The expected result is Negative.  Fact Sheet for Patients: EntrepreneurPulse.com.au  Fact Sheet for Healthcare Providers: IncredibleEmployment.be  This test is not yet approved or cleared by the [place] FDA and  has been authorized for detection and/or diagnosis of SARS-CoV-2 by FDA under an Emergency Use Authorization (EUA).  This EUA will remain in effect (meaning this test  can be used) for the duration of  the COVID-19 declaration under Section 564(b)(1) of the Act, 21 U.S.C. section 360bbb-3(b)(1), unless the authorization is terminated or revoked sooner.     Influenza A by PCR NEGATIVE NEGATIVE Final   Influenza B by PCR NEGATIVE NEGATIVE Final    Comment: (NOTE) The Xpert Xpress SARS-CoV-2/FLU/RSV plus assay is intended as an aid in the diagnosis of influenza from Nasopharyngeal swab specimens and should not be used as a sole basis for treatment. Nasal washings and aspirates are unacceptable for Xpert Xpress SARS-CoV-2/FLU/RSV testing.  Fact Sheet for Patients: EntrepreneurPulse.com.au  Fact Sheet for Healthcare Providers: IncredibleEmployment.be  This test is  not yet approved or cleared by the [place] FDA and has been authorized for detection and/or diagnosis of SARS-CoV-2 by FDA under an Emergency Use Authorization (EUA). This EUA will remain in effect (meaning this test can be used) for the duration of the COVID-19 declaration under Section 564(b)(1) of the Act, 21 U.S.C. section 360bbb-3(b)(1), unless the authorization is terminated or revoked.  Performed at [facility], [address], [place], [place] [address]     Please note: You were cared for by a hospitalist during your hospital stay. Once you are discharged, your primary care physician will handle any further medical issues. Please note that NO REFILLS for any discharge medications will be authorized once you are discharged, as it is imperative that you return to your primary care physician (or establish a relationship with a primary care physician if you do not have one) for your post hospital discharge needs so that they can reassess your need for medications and monitor your lab values.    Time coordinating discharge: 40 minutes  SIGNED:   Shelly Coss, MD  [facility] 05/08/2021, 4:13 PM Pager [phone]  If 7PM-7AM, please contact night-coverage [email or web address] Password [email or web address]

## 2021-05-08 NOTE — Anesthesia Postprocedure Evaluation (Signed)
Anesthesia Post Note  Patient: Katherine Hall  Procedure(s) Performed: ESOPHAGOGASTRODUODENOSCOPY (EGD) WITH PROPOFOL (N/A )     Patient location during evaluation: PACU Anesthesia Type: MAC Level of consciousness: awake and alert and oriented Pain management: pain level controlled Vital Signs Assessment: post-procedure vital signs reviewed and stable Respiratory status: spontaneous breathing, nonlabored ventilation and respiratory function stable Cardiovascular status: stable and blood pressure returned to baseline Postop Assessment: no apparent nausea or vomiting Anesthetic complications: no   No complications documented.  Last Vitals:  Vitals:   05/08/21 1319 05/08/21 1321  BP: (!) 80/36 136/61  Pulse: (!) 58 70  Resp: (!) 21 18  Temp:  36.4 C  SpO2: 100% 95%    Last Pain:  Vitals:   05/08/21 1321  TempSrc: Oral  PainSc: 0-No pain                 Edy Mcbane A.

## 2021-05-08 NOTE — Interval H&P Note (Signed)
History and Physical Interval Note:  05/08/2021 12:53 PM  Katherine Hall  has presented today for surgery, with the diagnosis of melena, anemia.  The various methods of treatment have been discussed with the patient and family. After consideration of risks, benefits and other options for treatment, the patient has consented to  Procedure(s): ESOPHAGOGASTRODUODENOSCOPY (EGD) WITH PROPOFOL (N/A) as a surgical intervention.  The patient's history has been reviewed, patient examined, no change in status, stable for surgery.  I have reviewed the patient's chart and labs.  Questions were answered to the patient's satisfaction.     Landry Dyke

## 2021-05-08 NOTE — Plan of Care (Signed)

## 2021-05-08 NOTE — TOC Progression Note (Signed)
Transition of Care [facility]) - Progression Note    Patient Details  Name: Katherine Hall MRN: [id number] Date of Birth: 1941/08/08  [facility] CM/SW Contact  Purcell Mouton, RN Phone Number: 05/08/2021, 10:30 AM  Clinical Narrative:     Pt from home with husband and plan to return.   Expected Discharge Plan: Home/Self Care Barriers to Discharge: No Barriers Identified  Expected Discharge Plan and Services Expected Discharge Plan: Home/Self Care       Living arrangements for the past 2 months: Single Family Home                                       Social Determinants of Health (SDOH) Interventions    Readmission Risk Interventions No flowsheet data found.

## 2021-05-08 NOTE — Anesthesia Procedure Notes (Signed)
Procedure Name: [place] Performed by: West Pugh, CRNA Pre-anesthesia Checklist: Patient identified, Emergency Drugs available, Suction available, Patient being monitored and Timeout performed Patient Re-evaluated:Patient Re-evaluated prior to induction Oxygen Delivery Method: Simple face mask Preoxygenation: Pre-oxygenation with 100% oxygen Placement Confirmation: positive ETCO2 Dental Injury: Teeth and Oropharynx as per pre-operative assessment

## 2021-05-08 NOTE — Anesthesia Preprocedure Evaluation (Addendum)
Anesthesia Evaluation  Patient identified by MRN, date of birth, ID band Patient awake    Reviewed: Allergy & Precautions, NPO status , Patient's Chart, lab work & pertinent test results  Airway Mallampati: II  TM Distance: >3 FB Neck ROM: Full    Dental  (+) Partial Upper, Partial Lower, Dental Advisory Given   Pulmonary former smoker,  5/13 URI symptoms, tested + for Covid 5/19, asymptomatic currently   Pulmonary exam normal breath sounds clear to auscultation       Cardiovascular Exercise Tolerance: Poor hypertension, Pt. 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 Left ventricular ejection fraction, by estimation, is 65 to 70%. The left ventricle has hyperdynamic function. The left ventricle has no regional wall motion abnormalities. There is mild left ventricular hypertrophy. Left ventricular diastolic parameters are consistent with Grade I diastolic dysfunction (impaired relaxation).  2. Right ventricular systolic function is normal. The right ventricular size is normal. There is normal pulmonary artery systolic pressure. The estimated right ventricular systolic pressure is 17.6 mmHg.  3. The mitral valve is normal in structure. No evidence of mitral valve regurgitation. No evidence of mitral stenosis.  4. The aortic valve is tricuspid. Aortic valve regurgitation is mild. Mild aortic valve sclerosis is present, with no evidence of aortic valve stenosis.  5. The inferior vena cava is normal in size with greater than 50% respiratory variability, suggesting right atrial pressure of 3 mmHg.   Cardiac Cath 02/03/21  Prox LAD to Mid LAD lesion is 100% stenosed.  Mid Cx lesion is 100% stenosed.  1st Mrg  lesion is 100% stenosed.  Prox RCA lesion is 40% stenosed.  Prox RCA to Mid RCA lesion is 70% stenosed.  RPDA lesion is 100% stenosed.  RPAV lesion is 90% stenosed.  Dist RCA lesion is 60% stenosed.  SVG and is normal in caliber.  The graft exhibits no disease.  SVG.  Insertion lesion is 60% stenosed.  LIMA and is normal in caliber.  The graft exhibits no disease.  Origin to Prox Graft lesion is 90% stenosed.  Mid Graft lesion is 95% stenosed.  Mid Graft to Dist Graft lesion is 40% stenosed.  Post intervention, there is a 70% residual stenosis.  Balloon angioplasty was performed using a BALLOON SAPPHIRE 2.5X12.  Prox Graft lesion is 90% stenosed.   1.  Severe underlying three-vessel coronary artery disease with patent grafts including LIMA to LAD, SVG to OM1/OM 3, SVG to diagonal with 60% anastomosis stenosis and SVG to right PDA.  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 2.  Left ventricular angiography was not performed.  EF was normal by echo.  Normal LVEDP. 3.  Unsuccessful attempted angioplasty to SVG to right PDA due to inability to cross the stenosis beyond the aneurysmal segment.  There was loss of flow and acute closure with wire manipulation.  Multiple CTO wires were used but with no success.  The patient developed mild inferior ST elevation on the monitor with throat discomfort.  Symptoms improved with nitroglycerin drip.     Neuro/Psych negative neurological ROS  negative psych ROS   GI/Hepatic PUD, Melena  Gi Bleed  Endo/Other  Hypothyroidism Hypercholesterolemia  Renal/GU   negative genitourinary   Musculoskeletal  (+) Arthritis , Osteoarthritis,    Abdominal   Peds  Hematology  (+) anemia , Plavix therapy- last dose 6/2   Anesthesia Other Findings   Reproductive/Obstetrics                              Anesthesia Physical Anesthesia Plan  ASA: III  Anesthesia Plan: MAC   Post-op Pain Management:    Induction: Intravenous  PONV Risk Score and Plan: 3 and Treatment may vary due to age or medical condition and Propofol infusion  Airway Management Planned: Natural Airway, Nasal Cannula and Simple Face Mask  Additional Equipment:   Intra-op Plan:   Post-operative Plan:   Informed Consent: I have reviewed the patients History and Physical, chart, labs and discussed the procedure including the risks, benefits and alternatives for the proposed anesthesia with the patient or authorized representative who has indicated his/her understanding and acceptance.     Dental advisory given  Plan Discussed with: CRNA and Anesthesiologist  Anesthesia Plan Comments:         Anesthesia Quick Evaluation

## 2021-05-10 ENCOUNTER — Encounter (HOSPITAL_COMMUNITY): Payer: Self-pay | Admitting: Gastroenterology

## 2021-05-19 NOTE — Telephone Encounter (Signed)
Spoke with patient to update her that Marion Downer is still on hold for now -- hopeful to have an update on how to proceed after June 04, 2021.

## 2021-05-25 ENCOUNTER — Telehealth: Payer: Self-pay | Admitting: *Deleted

## 2021-05-25 DIAGNOSIS — Z006 Encounter for examination for normal comparison and control in clinical research program: Secondary | ICD-10-CM

## 2021-05-25 NOTE — Telephone Encounter (Signed)
I called patient for 30-day post study visit for Clear Study. I assessed medications and adverse events. Patient is prepping for endoscopy and colonoscoy. Patient will receive a blood transfusion for low hemoglobin.  I thanked patient for participating in the Clear Study. Patient reminded to eat heart healthy diet and regular exercise program as tolerated.Patient will be following up with Dr. Debara Pickett for cholesterol management.

## 2021-06-15 ENCOUNTER — Other Ambulatory Visit: Payer: Self-pay

## 2021-06-15 ENCOUNTER — Ambulatory Visit: Payer: Medicare Other | Admitting: Cardiovascular Disease

## 2021-06-15 ENCOUNTER — Encounter: Payer: Self-pay | Admitting: Cardiovascular Disease

## 2021-06-15 VITALS — BP 110/78 | HR 52 | Resp 18 | Ht 59.0 in | Wt 124.2 lb

## 2021-06-15 DIAGNOSIS — E7801 Familial hypercholesterolemia: Secondary | ICD-10-CM

## 2021-06-15 DIAGNOSIS — I739 Peripheral vascular disease, unspecified: Secondary | ICD-10-CM

## 2021-06-15 DIAGNOSIS — I2510 Atherosclerotic heart disease of native coronary artery without angina pectoris: Secondary | ICD-10-CM | POA: Diagnosis not present

## 2021-06-15 DIAGNOSIS — I10 Essential (primary) hypertension: Secondary | ICD-10-CM | POA: Diagnosis not present

## 2021-06-15 DIAGNOSIS — D500 Iron deficiency anemia secondary to blood loss (chronic): Secondary | ICD-10-CM

## 2021-06-15 NOTE — Patient Instructions (Signed)
Medication Instructions:  STOP: ASPIRIN *If you need a refill on your cardiac medications before your next appointment, please call your pharmacy*  Lab Work: [place] If you have labs (blood work) drawn today and your tests are completely normal, you will receive your results only by: [place] (if you have MyChart) OR A paper copy in the mail If you have any lab test that is abnormal or we need to change your treatment, we will call you to review the results.  Follow-Up: At [facility], you and your health needs are our priority.  As part of our continuing mission to provide you with exceptional heart care, we have created designated Provider Care Teams.  These Care Teams include your primary Cardiologist (physician) and Advanced Practice Providers (APPs -  Physician Assistants and Nurse Practitioners) who all work together to provide you with the care you need, when you need it.  We recommend signing up for the patient portal called "MyChart".  Sign up information is provided on this After Visit Summary.  MyChart is used to connect with patients for Virtual Visits (Telemedicine).  Patients are able to view lab/test results, encounter notes, upcoming appointments, etc.  Non-urgent messages can be sent to your provider as well.   To learn more about what you can do with MyChart, go to [email or web address].    Your next appointment:   3 month(s)  The format for your next appointment:   In Person  Provider:   [name], MD

## 2021-06-15 NOTE — Progress Notes (Addendum)
Cardiology Office Note   Date:  06/15/2021   ID:  Katherine Hall, DOB [birth year], MRN [id number]  PCP:  Stacie Glaze, DO  Cardiologist: Dr. Acie Fredrickson  No chief complaint on file.     History of Present Illness: Katherine Hall is a 80 y.o. female who is here today for follow-up regarding peripheral arterial disease.   She has known history of coronary artery disease status post CABG in 2017, essential hypertension, anemia and hyperlipidemia with intolerance to statins.  She is not diabetic and is a lifelong non-smoker. The patient was seen in late 2020 for severe left calf claudication.  Vascular studies showed an ABI of 0.96 on the right and 0.85 on the left with evidence of severe left SFA stenosis. Lower extremity angiography in December 2020 showed mild to moderate iliac disease, on the right side, there was mild diffuse SFA and popliteal disease with one-vessel runoff below the knee.  On the left side there was severe mid SFA stenosis and one-vessel runoff below the knee.  I performed successful drug-coated balloon angioplasty to the mid left SFA.   She was hospitalized in March, 2022 with unstable angina.  Cardiac catheterization showed severe underlying three-vessel coronary artery disease with patent grafts including LIMA to LAD, SVG to OM1/OM 3, SVG to diagonal with 60% anastomosis stenosis and SVG to right PDA.  There was 90% stenosis in the proximal portion of the SVG to right PDA followed by another 90% stenosis leading into an aneurysmal segment which was followed by a 95% stenosis.  I attempted angioplasty of SVG to right PDA but I was not able to cross the stenosis with multiple wires beyond the aneurysmal segment.  Subsequently, there was loss of flow and acute closure with wire manipulation.  The patient was treated medically and did reasonably well.    She was most recently seen for severe right calf claudication with evidence of SFA disease.  Angiography in April of this  year showed no obstructive aortoiliac disease, severe stenosis in the right proximal/mid SFA with one-vessel runoff below the knee via the anterior tibial artery.  I performed successful drug-coated balloon angioplasty to the right SFA.  She had resolution of right calf claudication.   She had near syncope in May and was diagnosed with COVID-19 infection.  She was subsequently hospitalized in June with GI bleed requiring transfusion.  Plavix was held initially and then was resumed after 5 days.  She has been doing reasonably well with no chest pain.  However, she reports mild bilateral calf claudication slightly worse on the left than the right.  She also feels fatigued.  Past Medical History:  Diagnosis Date   Anemia    Arthritis    Coronary artery disease    s/p CABG in 2017 // Myoview 11/21: No ischemia or infarction, EF > 65, low risk  // [place] >> cath 3/22: S-RPDA w severe disease - PCI unsuccessful & c/b peri-procedure MI (acute closure w wire manipulation>>inf ST elev) >> Med Rx    Echocardiogram 02/2021    EF 65-70, no RWMA, mild LVH, Gr 1 DD, normal RVSF, RVSP 19.5, mild AI, mild AV sclerosis   Familial hypercholesterolemia    a. h/o intolerance of statins, prev on Repatha but insurance stopped covering.   Gastric ulcer    a. h/o bleeding ulcer 2009 requiring 3 pints of blood.   History of removal of cyst    a. per pt, h/o open heart surgery for tennis-ball sized  cyst on heart.   HTN (hypertension)    Hypothyroidism    PAD (peripheral artery disease) (HCC)    S/p L SFA PTA // ABIs/LE arterial US 4/22: R 0.7; L 0.86 // R SFA 75-99; L SFA angioplasty site 30-49, distal 50-74 // ectatic distal aorta 2.6 x 2.6 cm   Prediabetes     Past Surgical History:  Procedure Laterality Date   ABDOMINAL AORTOGRAM W/LOWER EXTREMITY Bilateral 11/06/2019   Procedure: ABDOMINAL AORTOGRAM W/LOWER EXTREMITY;  Surgeon: Wellington Hampshire, MD;  Location: [place] [facility];  Service: Cardiovascular;   Laterality: Bilateral;   ABDOMINAL AORTOGRAM W/LOWER EXTREMITY N/A 03/31/2021   Procedure: ABDOMINAL AORTOGRAM W/LOWER EXTREMITY;  Surgeon: Wellington Hampshire, MD;  Location: [place] [facility];  Service: Cardiovascular;  Laterality: N/A;   ABDOMINAL HYSTERECTOMY     APPENDECTOMY     CARDIAC CATHETERIZATION N/A 07/05/2016   Procedure: Left Heart Cath and Coronary Angiography;  Surgeon: Burnell Blanks, MD;  Location: [place] [facility];  Service: Cardiovascular;  Laterality: N/A;   CARDIAC SURGERY     cyst, not on heart.   CORONARY ARTERY BYPASS GRAFT N/A 07/07/2016   Procedure: CORONARY ARTERY BYPASS GRAFTING (CABG) x 5 with endoscopic harvesting of the right greater saphenous vein;  Surgeon: Gaye Pollack, MD;  Location: [place] [facility];  Service: Open Heart Surgery;  Laterality: N/A;   CORONARY BALLOON ANGIOPLASTY N/A 02/03/2021   Procedure: CORONARY BALLOON ANGIOPLASTY;  Surgeon: Wellington Hampshire, MD;  Location: [place] [facility];  Service: Cardiovascular;  Laterality: N/A;  svg to pda   ESOPHAGOGASTRODUODENOSCOPY (EGD) WITH PROPOFOL N/A 05/08/2021   Procedure: ESOPHAGOGASTRODUODENOSCOPY (EGD) WITH PROPOFOL;  Surgeon: Arta Silence, MD;  Location: [facility];  Service: Endoscopy;  Laterality: N/A;   HEMORRHOID SURGERY     INTRAOPERATIVE TRANSESOPHAGEAL ECHOCARDIOGRAM N/A 07/07/2016   Procedure: INTRAOPERATIVE TRANSESOPHAGEAL ECHOCARDIOGRAM;  Surgeon: Gaye Pollack, MD;  Location: [place] [facility];  Service: Open Heart Surgery;  Laterality: N/A;   LEFT HEART CATH AND CORS/GRAFTS ANGIOGRAPHY N/A 02/03/2021   Procedure: LEFT HEART CATH AND CORS/GRAFTS ANGIOGRAPHY;  Surgeon: Wellington Hampshire, MD;  Location: [place] [facility];  Service: Cardiovascular;  Laterality: N/A;   PERIPHERAL VASCULAR BALLOON ANGIOPLASTY  11/06/2019   Procedure: PERIPHERAL VASCULAR BALLOON ANGIOPLASTY;  Surgeon: Wellington Hampshire, MD;  Location: [place] [facility];  Service: Cardiovascular;;  cutting and dcb   PERIPHERAL VASCULAR BALLOON  ANGIOPLASTY Right 03/31/2021   Procedure: PERIPHERAL VASCULAR BALLOON ANGIOPLASTY;  Surgeon: Wellington Hampshire, MD;  Location: [place] [facility];  Service: Cardiovascular;  Laterality: Right;  SFA   SHOULDER SURGERY       Current Outpatient Medications  Medication Sig Dispense Refill   acetaminophen (TYLENOL) 650 MG CR tablet Take 1,300 mg by mouth in the morning and at bedtime.     acyclovir (ZOVIRAX) 400 MG tablet Take 800 mg by mouth daily as needed (fever blisters).      alendronate (FOSAMAX) 70 MG tablet Take 70 mg by mouth once a week.     aspirin 81 MG chewable tablet Chew 1 tablet (81 mg total) by mouth daily.     clopidogrel (PLAVIX) 75 MG tablet Take 1 tablet (75 mg total) by mouth daily. Resume only after 5 days ( on 05/13/21) 90 tablet 3   diphenhydramine-acetaminophen (TYLENOL PM) 25-500 MG TABS tablet Take 2 tablets by mouth at bedtime.     ferrous sulfate 325 (65 FE) MG EC tablet Take 325 mg by mouth 3 (  three) times a week.     fluticasone (FLONASE) 50 MCG/ACT nasal spray Place 2 sprays into both nostrils daily as needed for allergies.      hydrochlorothiazide (HYDRODIURIL) 25 MG tablet Take 25 mg by mouth daily.     isosorbide mononitrate (IMDUR) 30 MG 24 hr tablet Take 30 mg by mouth daily.     levothyroxine (SYNTHROID, LEVOTHROID) 50 MCG tablet Take 50 mcg by mouth daily before breakfast.     nitroGLYCERIN (NITROSTAT) 0.4 MG SL tablet Place 1 tablet (0.4 mg total) under the tongue every 5 (five) minutes x 3 doses as needed for chest pain. 25 tablet 1   pantoprazole (PROTONIX) 40 MG tablet Take 40 mg by mouth daily.     traMADol (ULTRAM) 50 MG tablet Take 1 tablet (50 mg total) by mouth every 6 (six) hours as needed for severe pain. (Patient taking differently: Take 50 mg by mouth 2 (two) times daily.) 28 tablet 0   metoprolol tartrate (LOPRESSOR) 25 MG tablet Take 0.5 tablets (12.5 mg total) by mouth 2 (two) times daily. 45 tablet 3   No current facility-administered  medications for this visit.    Allergies:   Penicillins, Lincomycin, Naproxen sodium, Statins, Latex, and Tape    Social History:  The patient  reports that she has never smoked. She has never used smokeless tobacco. She reports that she does not drink alcohol and does not use drugs.   Family History:  The patient's family history includes CAD in her father; Coronary artery disease in her brother and sister; Stroke in her mother.    ROS:  Please see the history of present illness.   Otherwise, review of systems are positive for none.   All other systems are reviewed and negative.    PHYSICAL EXAM: VS:  BP 110/78   Pulse (!) 52   Resp 18   Ht 4\' 11"  (1.499 m)   Wt 124 lb 3.2 oz (56.3 kg)   SpO2 99%   BMI 25.09 kg/m  , BMI Body mass index is 25.09 kg/m. GEN: Well nourished, well developed, in no acute distress  HEENT: normal  Neck: no JVD, carotid bruits, or masses Cardiac: RRR; no murmurs, rubs, or gallops,no edema  Respiratory:  clear to auscultation bilaterally, normal work of breathing GI: soft, nontender, nondistended, + BS MS: no deformity or atrophy  Skin: warm and dry, no rash Neuro:  Strength and sensation are intact Psych: euthymic mood, full affect Vascular: Femoral pulses +2 bilaterally.  Distal pulses are very diminished.  EKG:  EKG is  ordered today. EKG showed sinus bradycardia with poor R wave progression in the anterior leads.   Recent Labs: 02/02/2021: TSH 0.960 05/06/2021: ALT 8 05/08/2021: BUN 12; Creatinine, Ser 0.52; Hemoglobin 10.0; Platelets 384; Potassium 3.7; Sodium 137    Lipid Panel    Component Value Date/Time   CHOL 356 (H) 12/10/2020 1007   TRIG 213 (H) 12/10/2020 1007   HDL 71 12/10/2020 1007   CHOLHDL 5.0 (H) 12/10/2020 1007   CHOLHDL 4.6 11/08/2019 0404   VLDL 21 11/08/2019 0404   LDLCALC 243 (H) 12/10/2020 1007      Wt Readings from Last 3 Encounters:  06/15/21 124 lb 3.2 oz (56.3 kg)  05/08/21 131 lb 9.8 oz (59.7 kg)  03/31/21  122 lb (55.3 kg)       No flowsheet data found.    ASSESSMENT AND PLAN:  1.  Peripheral arterial disease: Status post bilateral SFA intervention most recently  in April of this year on the right side.  She reports mild bilateral leg claudication especially on the left side.  Symptoms are clearly not lifestyle limiting at this point.  I recommend continuing medical therapy.  Most recent Doppler studies showed normal ABI bilaterally.  We will repeat ABI in lower extremity duplex and November. Given recent GI bleed, recommend stopping aspirin and continuing clopidogrel.  2.  Coronary artery disease involving native coronary arteries: Status post CABG in 2017 .  Recent unstable angina with severe stenosis and SVG to right PDA which was not amenable to PCI.  The other grafts were patent.  Continue medical therapy.  3. Familial hyperlipidemia with severely elevated LDL: Intolerance to statins.  She is managed by Dr. Debara Pickett.  She is interested in enrolling in a research study.  4.  Essential hypertension: Blood pressure is reasonably controlled.    5.  Blood loss anemia: Given persistent fatigue, I will check CBC today.    Disposition:   FU with me in 3 months  Signed,  [name], MD  06/15/2021 11:51 AM    [place]

## 2021-06-16 LAB — CBC
Hematocrit: 32.1 % — ABNORMAL LOW (ref 34.0–46.6)
Hemoglobin: 10.1 g/dL — ABNORMAL LOW (ref 11.1–15.9)
MCH: 27.8 pg (ref 26.6–33.0)
MCHC: 31.5 g/dL (ref 31.5–35.7)
MCV: 88 fL (ref 79–97)
Platelets: 385 10*3/uL (ref 150–450)
RBC: 3.63 x10E6/uL — ABNORMAL LOW (ref 3.77–5.28)
RDW: 14 % (ref 11.7–15.4)
WBC: 7.7 10*3/uL (ref 3.4–10.8)

## 2021-06-18 ENCOUNTER — Telehealth: Payer: Self-pay | Admitting: Cardiovascular Disease

## 2021-06-18 NOTE — Telephone Encounter (Signed)
Patient is returning call to discuss lab results. 

## 2021-06-18 NOTE — Telephone Encounter (Signed)
Called pt back, reported the following from Dr. Fletcher Anon:  Wellington Hampshire, MD  06/17/2021  7:46 AM EDT      She is still anemic.  I did stop her aspirin during most recent visit.  Sheshould try to take iron supplement daily.    Pt verbalized understanding. Pt reminded of her appointment with Dr. Debara Pickett on 7/28. Pt verbalized understanding, reports she will call the office if she encounters any problems.

## 2021-06-21 ENCOUNTER — Telehealth: Payer: Self-pay | Admitting: Internal Medicine

## 2021-06-21 NOTE — Telephone Encounter (Signed)
Patient returned call per operator message but was disconnected/lost  Attempted to return call to patient - left message to call back

## 2021-06-21 NOTE — Telephone Encounter (Signed)
Left message to call back to discuss appointment(s)

## 2021-06-21 NOTE — Telephone Encounter (Signed)
Message  Would reschedule appointment for 6 months - pursue Leqvio.    Dr. Lemmie Evens

## 2021-06-21 NOTE — Telephone Encounter (Signed)
Called patient of Dr. Debara Pickett (lipid) regarding upcoming appointment on 07/01/21. This appointment was scheduled in anticipation of her being able to start on Leqvio, however this medication has been on hold and she has not yet started.   Asked patient if she would like to keep this appointment to discuss if there are any other options for her at the present time. Of note, she saw Dr. Fletcher Anon last week and his note said she is interested in clinical trials.  Advised patient I will send a message to Dr. Debara Pickett to review her chart to see if she may could be referred for a trial and advice on if an appointment is needed to orchestrate this or if she can plan to follow up in a few months (in the hope there will be a positive update on Leqvio)

## 2021-06-25 ENCOUNTER — Other Ambulatory Visit: Payer: Self-pay

## 2021-06-25 MED ORDER — METOPROLOL TARTRATE 25 MG PO TABS: 12.5000 mg | ORAL_TABLET | Freq: Two times a day (BID) | ORAL | 3 refills | Status: DC

## 2021-06-29 NOTE — Telephone Encounter (Signed)
Spoke with patient and explained that since leqvio is on HOLD, MD suggested a 6 month follow up, in the hopes we can get her on this medication in the interim. She is OK with plan. 07/01/21 appointment cancelled and recall entered

## 2021-07-01 ENCOUNTER — Ambulatory Visit: Payer: Medicare Other | Admitting: Internal Medicine

## 2021-08-17 ENCOUNTER — Other Ambulatory Visit: Payer: Self-pay

## 2021-08-17 ENCOUNTER — Ambulatory Visit: Payer: Medicare Other | Admitting: Cardiovascular Disease

## 2021-08-17 ENCOUNTER — Encounter: Payer: Self-pay | Admitting: Cardiovascular Disease

## 2021-08-17 VITALS — BP 138/75 | HR 56 | Ht <= 58 in | Wt 128.0 lb

## 2021-08-17 DIAGNOSIS — I2510 Atherosclerotic heart disease of native coronary artery without angina pectoris: Secondary | ICD-10-CM | POA: Diagnosis not present

## 2021-08-17 DIAGNOSIS — I10 Essential (primary) hypertension: Secondary | ICD-10-CM

## 2021-08-17 DIAGNOSIS — I739 Peripheral vascular disease, unspecified: Secondary | ICD-10-CM | POA: Diagnosis not present

## 2021-08-17 DIAGNOSIS — E7801 Familial hypercholesterolemia: Secondary | ICD-10-CM | POA: Diagnosis not present

## 2021-08-17 NOTE — Progress Notes (Signed)
Cardiology Office Note   Date:  08/17/2021   ID:  Katherine Hall, DOB 1941-09-30, [name]  PCP:  Stacie Glaze, DO  Cardiologist: Dr. Acie Fredrickson  No chief complaint on file.     History of Present Illness: Katherine Hall is a 80 y.o. female who is here today for follow-up regarding peripheral arterial disease.   She has known history of coronary artery disease status post CABG in 2017, essential hypertension, anemia and hyperlipidemia with intolerance to statins.  She is not diabetic and is a lifelong non-smoker. The patient was seen in late 2020 for severe left calf claudication.  Vascular studies showed an ABI of 0.96 on the right and 0.85 on the left with evidence of severe left SFA stenosis. Lower extremity angiography in December 2020 showed mild to moderate iliac disease, on the right side, there was mild diffuse SFA and popliteal disease with one-vessel runoff below the knee.  On the left side there was severe mid SFA stenosis and one-vessel runoff below the knee.  I performed successful drug-coated balloon angioplasty to the mid left SFA.   She was hospitalized in March, 2022 with unstable angina.  Cardiac catheterization showed severe underlying three-vessel coronary artery disease with patent grafts including LIMA to LAD, SVG to OM1/OM 3, SVG to diagonal with 60% anastomosis stenosis and SVG to right PDA.  There was 90% stenosis in the proximal portion of the SVG to right PDA followed by another 90% stenosis leading into an aneurysmal segment which was followed by a 95% stenosis.  I attempted angioplasty of SVG to right PDA but I was not able to cross the stenosis with multiple wires beyond the aneurysmal segment.  Subsequently, there was loss of flow and acute closure with wire manipulation.  The patient was treated medically and did reasonably well.    She was most recently seen for severe right calf claudication with evidence of SFA disease.  Angiography in April of this  year showed no obstructive aortoiliac disease, severe stenosis in the right proximal/mid SFA with one-vessel runoff below the knee via the anterior tibial artery.  I performed successful drug-coated balloon angioplasty to the right SFA.  She had resolution of right calf claudication.   She had near syncope in May and was diagnosed with COVID-19 infection.  She was subsequently hospitalized in June with GI bleed requiring transfusion.   Plavix was held and then was resumed without aspirin.  No bleeding since then.  She returns now with recurrent bilateral calf claudication worse on the right than the left.  This is happening after walking 1 block. No chest pain or shortness of breath.   Past Medical History:  Diagnosis Date   Anemia    Arthritis    Coronary artery disease    s/p CABG in 2017 // Myoview 11/21: No ischemia or infarction, EF > 65, low risk  // [place] >> cath 3/22: S-RPDA w severe disease - PCI unsuccessful & c/b peri-procedure MI (acute closure w wire manipulation>>inf ST elev) >> Med Rx    Echocardiogram 02/2021    EF 65-70, no RWMA, mild LVH, Gr 1 DD, normal RVSF, RVSP 19.5, mild AI, mild AV sclerosis   Familial hypercholesterolemia    a. h/o intolerance of statins, prev on Repatha but insurance stopped covering.   Gastric ulcer    a. h/o bleeding ulcer 2009 requiring 3 pints of blood.   History of removal of cyst    a. per pt, h/o open heart  surgery for tennis-ball sized cyst on heart.   HTN (hypertension)    Hypothyroidism    PAD (peripheral artery disease) (HCC)    S/p L SFA PTA // ABIs/LE arterial US 4/22: R 0.7; L 0.86 // R SFA 75-99; L SFA angioplasty site 30-49, distal 50-74 // ectatic distal aorta 2.6 x 2.6 cm   Prediabetes     Past Surgical History:  Procedure Laterality Date   ABDOMINAL AORTOGRAM W/LOWER EXTREMITY Bilateral 11/06/2019   Procedure: ABDOMINAL AORTOGRAM W/LOWER EXTREMITY;  Surgeon: Wellington Hampshire, MD;  Location: [facility];  Service:  Cardiovascular;  Laterality: Bilateral;   ABDOMINAL AORTOGRAM W/LOWER EXTREMITY N/A 03/31/2021   Procedure: ABDOMINAL AORTOGRAM W/LOWER EXTREMITY;  Surgeon: Wellington Hampshire, MD;  Location: [facility];  Service: Cardiovascular;  Laterality: N/A;   ABDOMINAL HYSTERECTOMY     APPENDECTOMY     CARDIAC CATHETERIZATION N/A 07/05/2016   Procedure: Left Heart Cath and Coronary Angiography;  Surgeon: Burnell Blanks, MD;  Location: [facility];  Service: Cardiovascular;  Laterality: N/A;   CARDIAC SURGERY     cyst, not on heart.   CORONARY ARTERY BYPASS GRAFT N/A 07/07/2016   Procedure: CORONARY ARTERY BYPASS GRAFTING (CABG) x 5 with endoscopic harvesting of the right greater saphenous vein;  Surgeon: Gaye Pollack, MD;  Location: [facility];  Service: Open Heart Surgery;  Laterality: N/A;   CORONARY BALLOON ANGIOPLASTY N/A 02/03/2021   Procedure: CORONARY BALLOON ANGIOPLASTY;  Surgeon: Wellington Hampshire, MD;  Location: [facility];  Service: Cardiovascular;  Laterality: N/A;  svg to pda   ESOPHAGOGASTRODUODENOSCOPY (EGD) WITH PROPOFOL N/A 05/08/2021   Procedure: ESOPHAGOGASTRODUODENOSCOPY (EGD) WITH PROPOFOL;  Surgeon: Arta Silence, MD;  Location: [facility];  Service: Endoscopy;  Laterality: N/A;   HEMORRHOID SURGERY     INTRAOPERATIVE TRANSESOPHAGEAL ECHOCARDIOGRAM N/A 07/07/2016   Procedure: INTRAOPERATIVE TRANSESOPHAGEAL ECHOCARDIOGRAM;  Surgeon: Gaye Pollack, MD;  Location: [facility];  Service: Open Heart Surgery;  Laterality: N/A;   LEFT HEART CATH AND CORS/GRAFTS ANGIOGRAPHY N/A 02/03/2021   Procedure: LEFT HEART CATH AND CORS/GRAFTS ANGIOGRAPHY;  Surgeon: Wellington Hampshire, MD;  Location: [facility];  Service: Cardiovascular;  Laterality: N/A;   PERIPHERAL VASCULAR BALLOON ANGIOPLASTY  11/06/2019   Procedure: PERIPHERAL VASCULAR BALLOON ANGIOPLASTY;  Surgeon: Wellington Hampshire, MD;  Location: [facility];  Service: Cardiovascular;;  cutting and dcb   PERIPHERAL  VASCULAR BALLOON ANGIOPLASTY Right 03/31/2021   Procedure: PERIPHERAL VASCULAR BALLOON ANGIOPLASTY;  Surgeon: Wellington Hampshire, MD;  Location: [facility];  Service: Cardiovascular;  Laterality: Right;  SFA   SHOULDER SURGERY       Current Outpatient Medications  Medication Sig Dispense Refill   acetaminophen (TYLENOL) 650 MG CR tablet Take 1,300 mg by mouth in the morning and at bedtime.     acyclovir (ZOVIRAX) 400 MG tablet Take 800 mg by mouth daily as needed (fever blisters).      alendronate (FOSAMAX) 70 MG tablet Take 70 mg by mouth once a week.     clopidogrel (PLAVIX) 75 MG tablet Take 1 tablet (75 mg total) by mouth daily. Resume only after 5 days ( on 05/13/21) 90 tablet 3   diphenhydramine-acetaminophen (TYLENOL PM) 25-500 MG TABS tablet Take 2 tablets by mouth at bedtime.     fluticasone (FLONASE) 50 MCG/ACT nasal spray Place 2 sprays into both nostrils daily as needed for allergies.      hydrochlorothiazide (HYDRODIURIL) 25 MG tablet Take  25 mg by mouth daily.     isosorbide mononitrate (IMDUR) 30 MG 24 hr tablet Take 30 mg by mouth daily.     levothyroxine (SYNTHROID, LEVOTHROID) 50 MCG tablet Take 50 mcg by mouth daily before breakfast.     metoprolol tartrate (LOPRESSOR) 25 MG tablet Take 0.5 tablets (12.5 mg total) by mouth 2 (two) times daily. 45 tablet 3   nitroGLYCERIN (NITROSTAT) 0.4 MG SL tablet Place 1 tablet (0.4 mg total) under the tongue every 5 (five) minutes x 3 doses as needed for chest pain. 25 tablet 1   pantoprazole (PROTONIX) 40 MG tablet Take 40 mg by mouth daily.     rOPINIRole (REQUIP) 2 MG tablet Take 2 mg by mouth at bedtime.     traMADol (ULTRAM) 50 MG tablet Take 1 tablet (50 mg total) by mouth every 6 (six) hours as needed for severe pain. (Patient taking differently: Take 50 mg by mouth 2 (two) times daily.) 28 tablet 0   No current facility-administered medications for this visit.    Allergies:   Penicillins, Lincomycin, Naproxen sodium,  Statins, Latex, and Tape    Social History:  The patient  reports that she has never smoked. She has never used smokeless tobacco. She reports that she does not drink alcohol and does not use drugs.   Family History:  The patient's family history includes CAD in her father; Coronary artery disease in her brother and sister; Stroke in her mother.    ROS:  Please see the history of present illness.   Otherwise, review of systems are positive for none.   All other systems are reviewed and negative.    PHYSICAL EXAM: VS:  BP 138/75 (BP Location: Left Arm, Patient Position: Sitting, Cuff Size: Normal)   Pulse (!) 56   Ht '4\' 8"'$  (1.422 m)   Wt 128 lb (58.1 kg)   SpO2 96%   BMI 28.70 kg/m  , BMI Body mass index is 28.7 kg/m. GEN: Well nourished, well developed, in no acute distress  HEENT: normal  Neck: no JVD, carotid bruits, or masses Cardiac: RRR; no murmurs, rubs, or gallops,no edema  Respiratory:  clear to auscultation bilaterally, normal work of breathing GI: soft, nontender, nondistended, + BS MS: no deformity or atrophy  Skin: warm and dry, no rash Neuro:  Strength and sensation are intact Psych: euthymic mood, full affect Vascular: Femoral pulses +2 bilaterally.  Dorsalis pedis is +1 bilaterally but slightly stronger on the left side.  EKG:  EKG is not ordered today.   Recent Labs: 02/02/2021: TSH 0.960 05/06/2021: ALT 8 05/08/2021: BUN 12; Creatinine, Ser 0.52; Potassium 3.7; Sodium 137 06/15/2021: Hemoglobin 10.1; Platelets 385    Lipid Panel    Component Value Date/Time   CHOL 356 (H) 12/10/2020 1007   TRIG 213 (H) 12/10/2020 1007   HDL 71 12/10/2020 1007   CHOLHDL 5.0 (H) 12/10/2020 1007   CHOLHDL 4.6 11/08/2019 0404   VLDL 21 11/08/2019 0404   LDLCALC 243 (H) 12/10/2020 1007      Wt Readings from Last 3 Encounters:  08/17/21 128 lb (58.1 kg)  06/15/21 124 lb 3.2 oz (56.3 kg)  05/08/21 131 lb 9.8 oz (59.7 kg)       No flowsheet data  found.    ASSESSMENT AND PLAN:  1.  Peripheral arterial disease: Status post bilateral SFA intervention most recently in April of this year on the right side.  She reports recurrent bilateral calf claudication worse on the right side.  I am going to repeat her lower extremity arterial Doppler studies.  Continue clopidogrel without aspirin for now given GI bleed this year. I explained to her that we have to address her underlying hyperlipidemia and recurrent restenosis.  2.  Coronary artery disease involving native coronary arteries: Status post CABG in 2017 .  Recent unstable angina with severe stenosis and SVG to right PDA which was not amenable to PCI.  The other grafts were patent.  Continue medical therapy.  3. Familial hyperlipidemia with severely elevated LDL: Intolerance to statins.  She is managed by Dr. Debara Pickett.  The patient was supposed to be started on Inclisiran and will try to assist with starting this.  4.  Essential hypertension: Blood pressure is reasonably controlled.  She reports significant fluctuations in her blood pressure at home.    Disposition:   FU with me in 4 months  Signed,  [name], MD  08/17/2021 8:02 AM    [place]

## 2021-08-17 NOTE — Patient Instructions (Signed)
Medication Instructions:  We will be in touch with you about starting the new medication called Inclisiran [place])  *If you need a refill on your cardiac medications before your next appointment, please call your pharmacy*   Lab Work: None ordered If you have labs (blood work) drawn today and your tests are completely normal, you will receive your results only by: [place] (if you have MyChart) OR A paper copy in the mail If you have any lab test that is abnormal or we need to change your treatment, we will call you to review the results.   Testing/Procedures: Your physician has requested that you have a lower extremity arterial duplex. During this test, ultrasound is used to evaluate arterial blood flow in the legs. Allow one hour for this exam. There are no restrictions or special instructions. This will take place at [place], [address].  Your physician has requested that you have an ankle brachial index (ABI). During this test an ultrasound and blood pressure cuff are used to evaluate the arteries that supply the arms and legs with blood. Allow thirty minutes for this exam. There are no restrictions or special instructions. This will take place at [place], [address].    Follow-Up: At [facility], you and your health needs are our priority.  As part of our continuing mission to provide you with exceptional heart care, we have created designated Provider Care Teams.  These Care Teams include your primary Cardiologist (physician) and Advanced Practice Providers (APPs -  Physician Assistants and Nurse Practitioners) who all work together to provide you with the care you need, when you need it.  We recommend signing up for the patient portal called "MyChart".  Sign up information is provided on this After Visit Summary.  MyChart is used to connect with patients for Virtual Visits (Telemedicine).  Patients are able to view lab/test results, encounter notes,  upcoming appointments, etc.  Non-urgent messages can be sent to your provider as well.   To learn more about what you can do with MyChart, go to [email or web address].    Your next appointment:   4 month(s)  The format for your next appointment:   In Person  Provider:   [name], MD

## 2021-08-20 ENCOUNTER — Other Ambulatory Visit: Payer: Self-pay | Admitting: Physician Assistant

## 2021-08-26 ENCOUNTER — Other Ambulatory Visit: Payer: Self-pay

## 2021-08-26 ENCOUNTER — Ambulatory Visit (HOSPITAL_COMMUNITY)
Admission: RE | Admit: 2021-08-26 | Discharge: 2021-08-26 | Disposition: A | Discharge status: Home / Self Care | Payer: Medicare Other | Source: Ambulatory Visit | Attending: Cardiology | Admitting: Cardiology

## 2021-08-26 DIAGNOSIS — I739 Peripheral vascular disease, unspecified: Secondary | ICD-10-CM | POA: Insufficient documentation

## 2021-09-01 ENCOUNTER — Telehealth: Payer: Self-pay | Admitting: Internal Medicine

## 2021-09-01 NOTE — Telephone Encounter (Signed)
Spoke with patient about Leqvio. She has an 80% coverage/20% co-insurance plan. Her OOP max is $4500 and this would need to be med before Leqvio would be covered 100%  Explained if approved by insurance, we could try to apply for patient assistance (EMPATH) from [employer]  PA will need to be submitted by calling [employer] at [phone]

## 2021-09-03 ENCOUNTER — Telehealth: Payer: Self-pay | Admitting: Internal Medicine

## 2021-09-03 IMAGING — DX DG CHEST 1V PORT
1 series · 1 of 1 positions shown · non-contrast
Comparison: February 02, 2021

CLINICAL DATA: Near syncopal episode.

EXAM:
PORTABLE CHEST 1 VIEW

[chest ap]
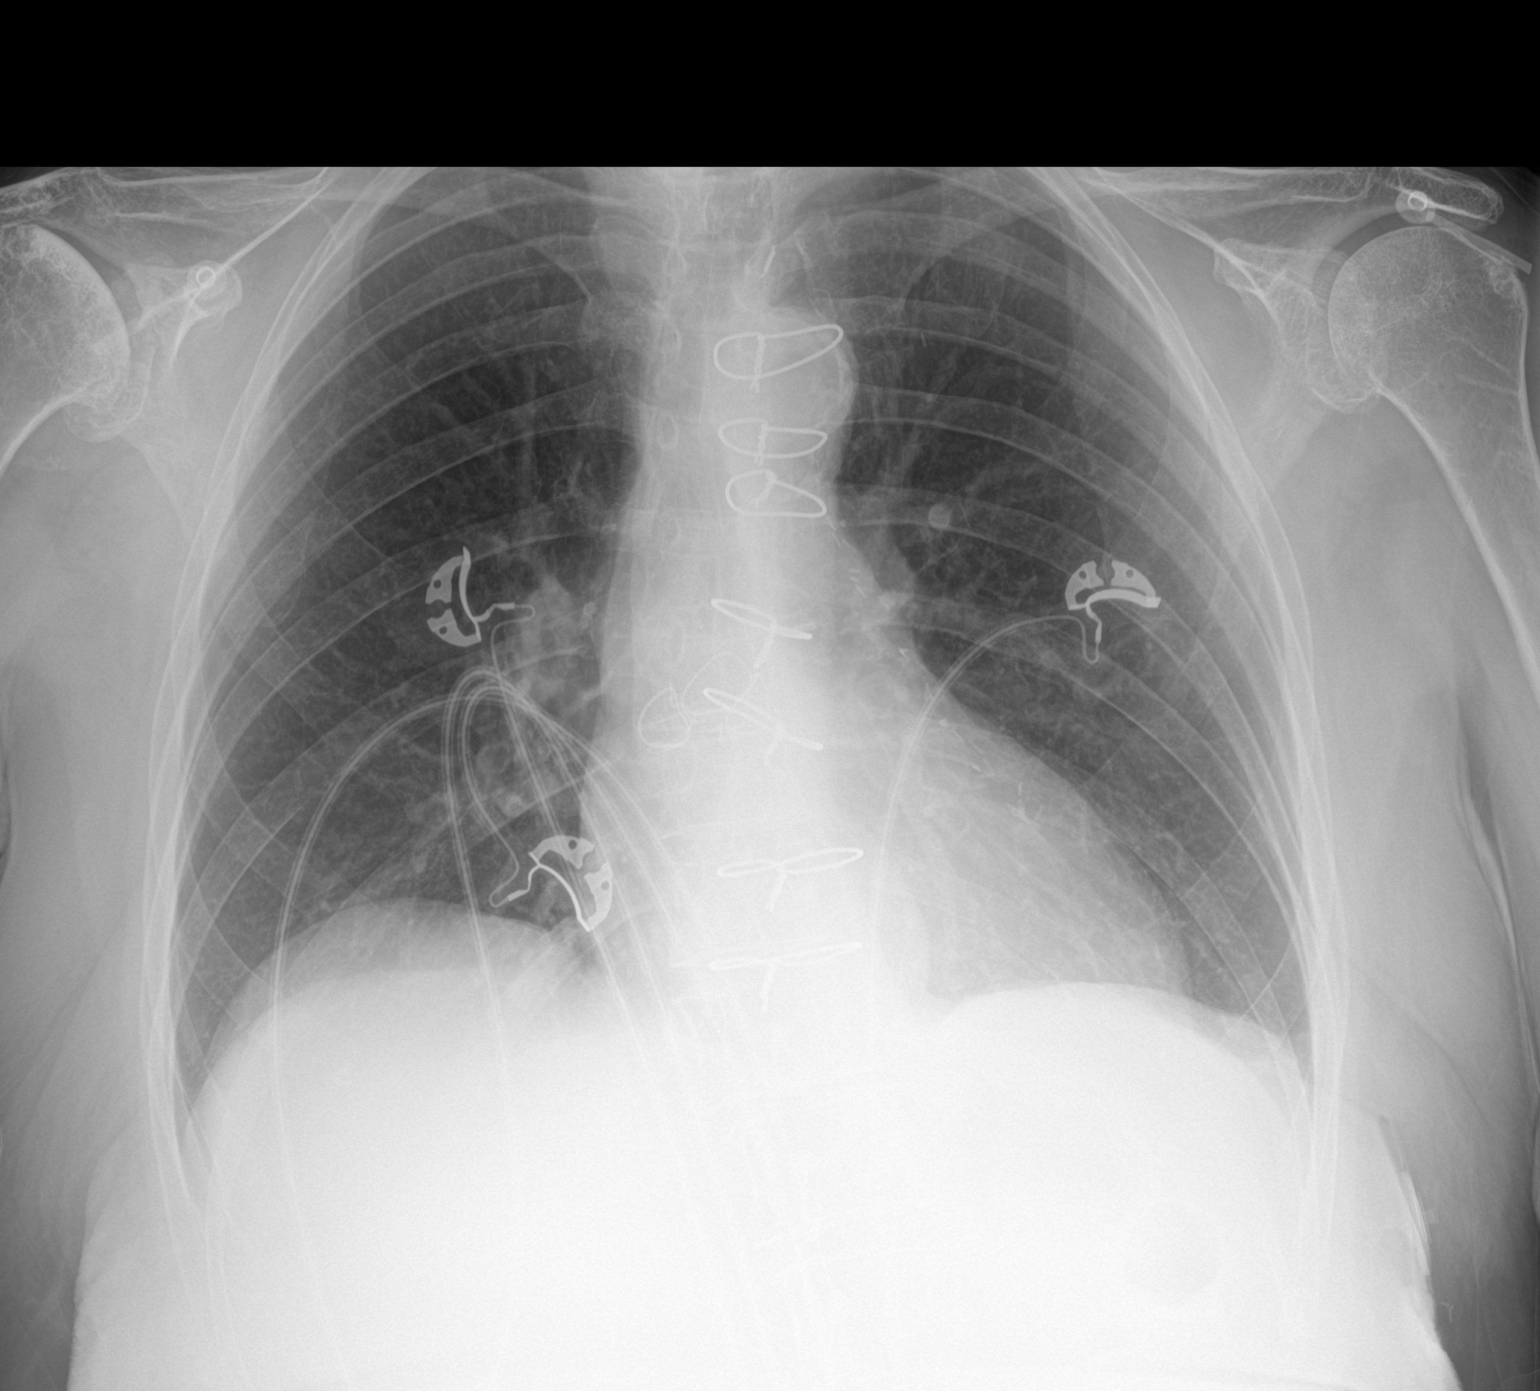

[1 of 1 positions shown; findings below may reference images not displayed]

FINDINGS: Stable postsurgical changes from CABG.

Cardiomediastinal silhouette is normal. Mediastinal contours appear
intact. Calcific atherosclerotic disease and tortuosity of the
aorta.

There is no evidence of focal airspace consolidation, pleural
effusion or pneumothorax.

Osseous structures are without acute abnormality. Soft tissues are
grossly normal.
IMPRESSION: No active disease.

## 2021-09-03 NOTE — Telephone Encounter (Signed)
This Probation officer called patient's insurance @ [phone] The rep was unable to find [place] [facility] tax ID in the system, thus could not proceed with PA on the phone Total time: 23 mins  Enlisted assisted from [place], Agricultural engineer. She has access to [facility] provider portal. Together we completed the PA for Leqvio in this portal. Authorization request is pending. Request ID: [id number] Total time: 24 mins

## 2021-09-07 ENCOUNTER — Encounter: Payer: Self-pay | Admitting: Cardiovascular Disease

## 2021-09-07 ENCOUNTER — Ambulatory Visit: Payer: Medicare Other | Admitting: Cardiovascular Disease

## 2021-09-07 ENCOUNTER — Other Ambulatory Visit: Payer: Self-pay

## 2021-09-07 VITALS — BP 139/75 | HR 63 | Ht <= 58 in | Wt 128.6 lb

## 2021-09-07 DIAGNOSIS — I739 Peripheral vascular disease, unspecified: Secondary | ICD-10-CM

## 2021-09-07 DIAGNOSIS — I2510 Atherosclerotic heart disease of native coronary artery without angina pectoris: Secondary | ICD-10-CM

## 2021-09-07 DIAGNOSIS — I10 Essential (primary) hypertension: Secondary | ICD-10-CM | POA: Diagnosis not present

## 2021-09-07 DIAGNOSIS — E7801 Familial hypercholesterolemia: Secondary | ICD-10-CM

## 2021-09-07 NOTE — Progress Notes (Signed)
Cardiology Office Note   Date:  09/07/2021   ID:  Jezabel Lecker, DOB [birth year], MRN [id number]  PCP:  Stacie Glaze, DO  Cardiologist: Dr. Acie Fredrickson  No chief complaint on file.     History of Present Illness: Katherine Hall is a 80 y.o. female who is here today for follow-up regarding peripheral arterial disease.   She has known history of coronary artery disease status post CABG in 2017, essential hypertension, anemia and hyperlipidemia with intolerance to statins.  She is not diabetic and is a lifelong non-smoker. The patient was seen in late 2020 for severe left calf claudication.  Vascular studies showed an ABI of 0.96 on the right and 0.85 on the left with evidence of severe left SFA stenosis. Lower extremity angiography in December 2020 showed mild to moderate iliac disease, on the right side, there was mild diffuse SFA and popliteal disease with one-vessel runoff below the knee.  On the left side there was severe mid SFA stenosis and one-vessel runoff below the knee.  I performed successful drug-coated balloon angioplasty to the mid left SFA.   She was hospitalized in March, 2022 with unstable angina.  Cardiac catheterization showed severe underlying three-vessel coronary artery disease with patent grafts including LIMA to LAD, SVG to OM1/OM 3, SVG to diagonal with 60% anastomosis stenosis and SVG to right PDA.  There was 90% stenosis in the proximal portion of the SVG to right PDA followed by another 90% stenosis leading into an aneurysmal segment which was followed by a 95% stenosis.  I attempted angioplasty of SVG to right PDA but I was not able to cross the stenosis with multiple wires beyond the aneurysmal segment.  Subsequently, there was loss of flow and acute closure with wire manipulation.  The patient was treated medically and did reasonably well.    She was most recently seen for severe right calf claudication with evidence of SFA disease.  Angiography in April of 2022  showed no obstructive aortoiliac disease, severe stenosis in the right proximal/mid SFA with one-vessel runoff below the knee via the anterior tibial artery.  I performed successful drug-coated balloon angioplasty to the right SFA.  She had resolution of right calf claudication.   She was hospitalized in June with GI bleed requiring transfusion.   Plavix was held and then was resumed without aspirin.  No bleeding since then.  She is seen now for recurrent bilateral calf claudication much worse on the left than the right side.  This is happening after walking short distance.  She underwent recent Doppler studies which showed an ABI of 0.74 on the right and 0.63 on the left.  Duplex on the right showed moderate popliteal and TP trunk stenosis.  On the left side, there was severe stenosis in the proximal SFA.   Past Medical History:  Diagnosis Date   Anemia    Arthritis    Coronary artery disease    s/p CABG in 2017 // Myoview 11/21: No ischemia or infarction, EF > 65, low risk  // [place] >> cath 3/22: S-RPDA w severe disease - PCI unsuccessful & c/b peri-procedure MI (acute closure w wire manipulation>>inf ST elev) >> Med Rx    Echocardiogram 02/2021    EF 65-70, no RWMA, mild LVH, Gr 1 DD, normal RVSF, RVSP 19.5, mild AI, mild AV sclerosis   Familial hypercholesterolemia    a. h/o intolerance of statins, prev on Repatha but insurance stopped covering.   Gastric ulcer  a. h/o bleeding ulcer 2009 requiring 3 pints of blood.   History of removal of cyst    a. 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   HTN (hypertension)    Hypothyroidism    PAD (peripheral artery disease) (HCC)    S/p L SFA PTA // ABIs/LE arterial US 4/22: R 0.7; L 0.86 // R SFA 75-99; L SFA angioplasty site 30-49, distal 50-74 // ectatic distal aorta 2.6 x 2.6 cm   Prediabetes     Past Surgical History:  Procedure Laterality Date   ABDOMINAL AORTOGRAM W/LOWER EXTREMITY Bilateral 11/06/2019    Procedure: ABDOMINAL AORTOGRAM W/LOWER EXTREMITY;  Surgeon: Wellington Hampshire, MD;  Location: [place];  Service: Cardiovascular;  Laterality: Bilateral;   ABDOMINAL AORTOGRAM W/LOWER EXTREMITY N/A 03/31/2021   Procedure: ABDOMINAL AORTOGRAM W/LOWER EXTREMITY;  Surgeon: Wellington Hampshire, MD;  Location: [place] [facility];  Service: Cardiovascular;  Laterality: N/A;   ABDOMINAL HYSTERECTOMY     APPENDECTOMY     CARDIAC CATHETERIZATION N/A 07/05/2016   Procedure: Left Heart Cath and Coronary Angiography;  Surgeon: Burnell Blanks, MD;  Location: [facility];  Service: Cardiovascular;  Laterality: N/A;   CARDIAC SURGERY     cyst, not on heart.   CORONARY ARTERY BYPASS GRAFT N/A 07/07/2016   Procedure: CORONARY ARTERY BYPASS GRAFTING (CABG) x 5 with endoscopic harvesting of the right greater saphenous vein;  Surgeon: Gaye Pollack, MD;  Location: [facility];  Service: Open Heart Surgery;  Laterality: N/A;   CORONARY BALLOON ANGIOPLASTY N/A 02/03/2021   Procedure: CORONARY BALLOON ANGIOPLASTY;  Surgeon: Wellington Hampshire, MD;  Location: [place] [facility];  Service: Cardiovascular;  Laterality: N/A;  svg to pda   ESOPHAGOGASTRODUODENOSCOPY (EGD) WITH PROPOFOL N/A 05/08/2021   Procedure: ESOPHAGOGASTRODUODENOSCOPY (EGD) WITH PROPOFOL;  Surgeon: Arta Silence, MD;  [facility];  Service: Endoscopy;  Laterality: N/A;   HEMORRHOID SURGERY     INTRAOPERATIVE TRANSESOPHAGEAL ECHOCARDIOGRAM N/A 07/07/2016   Procedure: INTRAOPERATIVE TRANSESOPHAGEAL ECHOCARDIOGRAM;  Surgeon: Gaye Pollack, MD;  Location: [facility];  Service: Open Heart Surgery;  Laterality: N/A;   LEFT HEART CATH AND CORS/GRAFTS ANGIOGRAPHY N/A 02/03/2021   Procedure: LEFT HEART CATH AND CORS/GRAFTS ANGIOGRAPHY;  Surgeon: Wellington Hampshire, MD;  Location: [place] [facility];  Service: Cardiovascular;  Laterality: N/A;   PERIPHERAL VASCULAR BALLOON ANGIOPLASTY  11/06/2019   Procedure: PERIPHERAL VASCULAR BALLOON ANGIOPLASTY;   Surgeon: Wellington Hampshire, MD;  Location: [place] [facility];  Service: Cardiovascular;;  cutting and dcb   PERIPHERAL VASCULAR BALLOON ANGIOPLASTY Right 03/31/2021   Procedure: PERIPHERAL VASCULAR BALLOON ANGIOPLASTY;  Surgeon: Wellington Hampshire, MD;  Location: [place] [facility];  Service: Cardiovascular;  Laterality: Right;  SFA   SHOULDER SURGERY       Current Outpatient Medications  Medication Sig Dispense Refill   acetaminophen (TYLENOL) 650 MG CR tablet Take 1,300 mg by mouth in the morning and at bedtime.     acyclovir (ZOVIRAX) 400 MG tablet Take 800 mg by mouth daily as needed (fever blisters).      alendronate (FOSAMAX) 70 MG tablet Take 70 mg by mouth once a week.     clopidogrel (PLAVIX) 75 MG tablet Take 1 tablet (75 mg total) by mouth daily. Resume only after 5 days ( on 05/13/21) 90 tablet 3   diphenhydramine-acetaminophen (TYLENOL PM) 25-500 MG TABS tablet Take 2 tablets by mouth at bedtime.     fluticasone (FLONASE)  50 MCG/ACT nasal spray Place 2 sprays into both nostrils daily as needed for allergies.      hydrochlorothiazide (HYDRODIURIL) 25 MG tablet Take 25 mg by mouth daily.     isosorbide mononitrate (IMDUR) 30 MG 24 hr tablet Take 30 mg by mouth daily.     levothyroxine (SYNTHROID, LEVOTHROID) 50 MCG tablet Take 50 mcg by mouth daily before breakfast.     metoprolol tartrate (LOPRESSOR) 25 MG tablet TAKE 1/2 TABLET(12.5 MG) BY MOUTH TWICE DAILY 45 tablet 3   nitroGLYCERIN (NITROSTAT) 0.4 MG SL tablet Place 1 tablet (0.4 mg total) under the tongue every 5 (five) minutes x 3 doses as needed for chest pain. 25 tablet 1   pantoprazole (PROTONIX) 40 MG tablet Take 40 mg by mouth daily.     rOPINIRole (REQUIP) 2 MG tablet Take 2 mg by mouth at bedtime.     traMADol (ULTRAM) 50 MG tablet Take 1 tablet (50 mg total) by mouth every 6 (six) hours as needed for severe pain. (Patient taking differently: Take 50 mg by mouth 2 (two) times daily.) 28 tablet 0   No current  facility-administered medications for this visit.    Allergies:   Penicillins, Lincomycin, Naproxen sodium, Statins, Latex, and Tape    Social History:  The patient  reports that she has never smoked. She has never used smokeless tobacco. She reports that she does not drink alcohol and does not use drugs.   Family History:  The patient's family history includes CAD in her father; Coronary artery disease in her brother and sister; Stroke in her mother.    ROS:  Please see the history of present illness.   Otherwise, review of systems are positive for none.   All other systems are reviewed and negative.    PHYSICAL EXAM: VS:  BP 139/75   Pulse 63   Ht 4\' 8"  (1.422 m)   Wt 128 lb 9.6 oz (58.3 kg)   SpO2 96%   BMI 28.83 kg/m  , BMI Body mass index is 28.83 kg/m. GEN: Well nourished, well developed, in no acute distress  HEENT: normal  Neck: no JVD, carotid bruits, or masses Cardiac: RRR; no murmurs, rubs, or gallops,no edema  Respiratory:  clear to auscultation bilaterally, normal work of breathing GI: soft, nontender, nondistended, + BS MS: no deformity or atrophy  Skin: warm and dry, no rash Neuro:  Strength and sensation are intact Psych: euthymic mood, full affect Vascular: Femoral pulses +2 bilaterally.   EKG:  EKG is not ordered today.   Recent Labs: 02/02/2021: TSH 0.960 05/06/2021: ALT 8 05/08/2021: BUN 12; Creatinine, Ser 0.52; Potassium 3.7; Sodium 137 06/15/2021: Hemoglobin 10.1; Platelets 385    Lipid Panel    Component Value Date/Time   CHOL 356 (H) 12/10/2020 1007   TRIG 213 (H) 12/10/2020 1007   HDL 71 12/10/2020 1007   CHOLHDL 5.0 (H) 12/10/2020 1007   CHOLHDL 4.6 11/08/2019 0404   VLDL 21 11/08/2019 0404   LDLCALC 243 (H) 12/10/2020 1007      Wt Readings from Last 3 Encounters:  09/07/21 128 lb 9.6 oz (58.3 kg)  08/17/21 128 lb (58.1 kg)  06/15/21 124 lb 3.2 oz (56.3 kg)       No flowsheet data found.    ASSESSMENT AND PLAN:  1.   Peripheral arterial disease: Status post bilateral SFA intervention most recently in April of this year on the right side.  She now reports severe left calf claudication with minimal walking.  She has evidence of significant stenosis in the left proximal SFA.   I discussed with her the option of continuing medical therapy for now and addressing her hyperlipidemia before considering revascularization.  However, she feels severely limited by her symptoms and wants relief of her symptoms.  Due to that, we will proceed with abdominal aortogram with lower extremity runoff and possible endovascular intervention.  Continue clopidogrel without aspirin. Planned access is via the right common femoral artery.  2.  Coronary artery disease involving native coronary arteries: Status post CABG in 2017 .  Recent unstable angina with severe stenosis and SVG to right PDA which was not amenable to PCI.  The other grafts were patent.  Continue medical therapy.  3. Familial hyperlipidemia with severely elevated LDL: Intolerance to statins.  She is managed by Dr. Debara Pickett.  She reports that she got approval from her insurance to start  [place].   4.  Essential hypertension: Blood pressure is reasonably controlled.  She reports significant fluctuations in her blood pressure at home.    Disposition:   FU with me after angiogram.  Signed,  [name], MD  09/07/2021 10:52 AM    [place]

## 2021-09-07 NOTE — H&P (View-Only) (Signed)
Cardiology Office Note   Date:  09/07/2021   ID:  Katherine Hall, DOB 1941-09-14, MRN [id number]  PCP:  Stacie Glaze, DO  Cardiologist: Dr. Acie Fredrickson  No chief complaint on file.     History of Present Illness: Katherine Hall is a 80 y.o. female who is here today for follow-up regarding peripheral arterial disease.   She has known history of coronary artery disease status post CABG in 2017, essential hypertension, anemia and hyperlipidemia with intolerance to statins.  She is not diabetic and is a lifelong non-smoker. The patient was seen in late 2020 for severe left calf claudication.  Vascular studies showed an ABI of 0.96 on the right and 0.85 on the left with evidence of severe left SFA stenosis. Lower extremity angiography in December 2020 showed mild to moderate iliac disease, on the right side, there was mild diffuse SFA and popliteal disease with one-vessel runoff below the knee.  On the left side there was severe mid SFA stenosis and one-vessel runoff below the knee.  I performed successful drug-coated balloon angioplasty to the mid left SFA.   She was hospitalized in March, 2022 with unstable angina.  Cardiac catheterization showed severe underlying three-vessel coronary artery disease with patent grafts including LIMA to LAD, SVG to OM1/OM 3, SVG to diagonal with 60% anastomosis stenosis and SVG to right PDA.  There was 90% stenosis in the proximal portion of the SVG to right PDA followed by another 90% stenosis leading into an aneurysmal segment which was followed by a 95% stenosis.  I attempted angioplasty of SVG to right PDA but I was not able to cross the stenosis with multiple wires beyond the aneurysmal segment.  Subsequently, there was loss of flow and acute closure with wire manipulation.  The patient was treated medically and did reasonably well.    She was most recently seen for severe right calf claudication with evidence of SFA disease.  Angiography in April of 2022  showed no obstructive aortoiliac disease, severe stenosis in the right proximal/mid SFA with one-vessel runoff below the knee via the anterior tibial artery.  I performed successful drug-coated balloon angioplasty to the right SFA.  She had resolution of right calf claudication.   She was hospitalized in June with GI bleed requiring transfusion.   Plavix was held and then was resumed without aspirin.  No bleeding since then.  She is seen now for recurrent bilateral calf claudication much worse on the left than the right side.  This is happening after walking short distance.  She underwent recent Doppler studies which showed an ABI of 0.74 on the right and 0.63 on the left.  Duplex on the right showed moderate popliteal and TP trunk stenosis.  On the left side, there was severe stenosis in the proximal SFA.   Past Medical History:  Diagnosis Date   Anemia    Arthritis    Coronary artery disease    s/p CABG in 2017 // Myoview 11/21: No ischemia or infarction, EF > 65, low risk  // [place] >> cath 3/22: S-RPDA w severe disease - PCI unsuccessful & c/b peri-procedure MI (acute closure w wire manipulation>>inf ST elev) >> Med Rx    Echocardiogram 02/2021    EF 65-70, no RWMA, mild LVH, Gr 1 DD, normal RVSF, RVSP 19.5, mild AI, mild AV sclerosis   Familial hypercholesterolemia    a. h/o intolerance of statins, prev on Repatha but insurance stopped covering.   Gastric ulcer  a. h/o bleeding ulcer 2009 requiring 3 pints of blood.   History of removal of cyst    a. 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   HTN (hypertension)    Hypothyroidism    PAD (peripheral artery disease) (HCC)    S/p L SFA PTA // ABIs/LE arterial US 4/22: R 0.7; L 0.86 // R SFA 75-99; L SFA angioplasty site 30-49, distal 50-74 // ectatic distal aorta 2.6 x 2.6 cm   Prediabetes     Past Surgical History:  Procedure Laterality Date   ABDOMINAL AORTOGRAM W/LOWER EXTREMITY Bilateral 11/06/2019    Procedure: ABDOMINAL AORTOGRAM W/LOWER EXTREMITY;  Surgeon: Wellington Hampshire, MD;  Location: [facility];  Service: Cardiovascular;  Laterality: Bilateral;   ABDOMINAL AORTOGRAM W/LOWER EXTREMITY N/A 03/31/2021   Procedure: ABDOMINAL AORTOGRAM W/LOWER EXTREMITY;  Surgeon: Wellington Hampshire, MD;  Location: [facility];  Service: Cardiovascular;  Laterality: N/A;   ABDOMINAL HYSTERECTOMY     APPENDECTOMY     CARDIAC CATHETERIZATION N/A 07/05/2016   Procedure: Left Heart Cath and Coronary Angiography;  Surgeon: Burnell Blanks, MD;  Location: [place] [facility];  Service: Cardiovascular;  Laterality: N/A;   CARDIAC SURGERY     cyst, not on heart.   CORONARY ARTERY BYPASS GRAFT N/A 07/07/2016   Procedure: CORONARY ARTERY BYPASS GRAFTING (CABG) x 5 with endoscopic harvesting of the right greater saphenous vein;  Surgeon: Gaye Pollack, MD;  Location: [facility];  Service: Open Heart Surgery;  Laterality: N/A;   CORONARY BALLOON ANGIOPLASTY N/A 02/03/2021   Procedure: CORONARY BALLOON ANGIOPLASTY;  Surgeon: Wellington Hampshire, MD;  Location: [place] [facility];  Service: Cardiovascular;  Laterality: N/A;  svg to pda   ESOPHAGOGASTRODUODENOSCOPY (EGD) WITH PROPOFOL N/A 05/08/2021   Procedure: ESOPHAGOGASTRODUODENOSCOPY (EGD) WITH PROPOFOL;  Surgeon: Arta Silence, MD;  [facility];  Service: Endoscopy;  Laterality: N/A;   HEMORRHOID SURGERY     INTRAOPERATIVE TRANSESOPHAGEAL ECHOCARDIOGRAM N/A 07/07/2016   Procedure: INTRAOPERATIVE TRANSESOPHAGEAL ECHOCARDIOGRAM;  Surgeon: Gaye Pollack, MD;  Location: [place] [facility];  Service: Open Heart Surgery;  Laterality: N/A;   LEFT HEART CATH AND CORS/GRAFTS ANGIOGRAPHY N/A 02/03/2021   Procedure: LEFT HEART CATH AND CORS/GRAFTS ANGIOGRAPHY;  Surgeon: Wellington Hampshire, MD;  Location: [place];  Service: Cardiovascular;  Laterality: N/A;   PERIPHERAL VASCULAR BALLOON ANGIOPLASTY  11/06/2019   Procedure: PERIPHERAL VASCULAR BALLOON ANGIOPLASTY;   Surgeon: Wellington Hampshire, MD;  Location: [place];  Service: Cardiovascular;;  cutting and dcb   PERIPHERAL VASCULAR BALLOON ANGIOPLASTY Right 03/31/2021   Procedure: PERIPHERAL VASCULAR BALLOON ANGIOPLASTY;  Surgeon: Wellington Hampshire, MD;  Location: [place];  Service: Cardiovascular;  Laterality: Right;  SFA   SHOULDER SURGERY       Current Outpatient Medications  Medication Sig Dispense Refill   acetaminophen (TYLENOL) 650 MG CR tablet Take 1,300 mg by mouth in the morning and at bedtime.     acyclovir (ZOVIRAX) 400 MG tablet Take 800 mg by mouth daily as needed (fever blisters).      alendronate (FOSAMAX) 70 MG tablet Take 70 mg by mouth once a week.     clopidogrel (PLAVIX) 75 MG tablet Take 1 tablet (75 mg total) by mouth daily. Resume only after 5 days ( on 05/13/21) 90 tablet 3   diphenhydramine-acetaminophen (TYLENOL PM) 25-500 MG TABS tablet Take 2 tablets by mouth at bedtime.     fluticasone (FLONASE)  50 MCG/ACT nasal spray Place 2 sprays into both nostrils daily as needed for allergies.      hydrochlorothiazide (HYDRODIURIL) 25 MG tablet Take 25 mg by mouth daily.     isosorbide mononitrate (IMDUR) 30 MG 24 hr tablet Take 30 mg by mouth daily.     levothyroxine (SYNTHROID, LEVOTHROID) 50 MCG tablet Take 50 mcg by mouth daily before breakfast.     metoprolol tartrate (LOPRESSOR) 25 MG tablet TAKE 1/2 TABLET(12.5 MG) BY MOUTH TWICE DAILY 45 tablet 3   nitroGLYCERIN (NITROSTAT) 0.4 MG SL tablet Place 1 tablet (0.4 mg total) under the tongue every 5 (five) minutes x 3 doses as needed for chest pain. 25 tablet 1   pantoprazole (PROTONIX) 40 MG tablet Take 40 mg by mouth daily.     rOPINIRole (REQUIP) 2 MG tablet Take 2 mg by mouth at bedtime.     traMADol (ULTRAM) 50 MG tablet Take 1 tablet (50 mg total) by mouth every 6 (six) hours as needed for severe pain. (Patient taking differently: Take 50 mg by mouth 2 (two) times daily.) 28 tablet 0   No current  facility-administered medications for this visit.    Allergies:   Penicillins, Lincomycin, Naproxen sodium, Statins, Latex, and Tape    Social History:  The patient  reports that she has never smoked. She has never used smokeless tobacco. She reports that she does not drink alcohol and does not use drugs.   Family History:  The patient's family history includes CAD in her father; Coronary artery disease in her brother and sister; Stroke in her mother.    ROS:  Please see the history of present illness.   Otherwise, review of systems are positive for none.   All other systems are reviewed and negative.    PHYSICAL EXAM: VS:  BP 139/75   Pulse 63   Ht 4\' 8"  (1.422 m)   Wt 128 lb 9.6 oz (58.3 kg)   SpO2 96%   BMI 28.83 kg/m  , BMI Body mass index is 28.83 kg/m. GEN: Well nourished, well developed, in no acute distress  HEENT: normal  Neck: no JVD, carotid bruits, or masses Cardiac: RRR; no murmurs, rubs, or gallops,no edema  Respiratory:  clear to auscultation bilaterally, normal work of breathing GI: soft, nontender, nondistended, + BS MS: no deformity or atrophy  Skin: warm and dry, no rash Neuro:  Strength and sensation are intact Psych: euthymic mood, full affect Vascular: Femoral pulses +2 bilaterally.   EKG:  EKG is not ordered today.   Recent Labs: 02/02/2021: TSH 0.960 05/06/2021: ALT 8 05/08/2021: BUN 12; Creatinine, Ser 0.52; Potassium 3.7; Sodium 137 06/15/2021: Hemoglobin 10.1; Platelets 385    Lipid Panel    Component Value Date/Time   CHOL 356 (H) 12/10/2020 1007   TRIG 213 (H) 12/10/2020 1007   HDL 71 12/10/2020 1007   CHOLHDL 5.0 (H) 12/10/2020 1007   CHOLHDL 4.6 11/08/2019 0404   VLDL 21 11/08/2019 0404   LDLCALC 243 (H) 12/10/2020 1007      Wt Readings from Last 3 Encounters:  09/07/21 128 lb 9.6 oz (58.3 kg)  08/17/21 128 lb (58.1 kg)  06/15/21 124 lb 3.2 oz (56.3 kg)       No flowsheet data found.    ASSESSMENT AND PLAN:  1.   Peripheral arterial disease: Status post bilateral SFA intervention most recently in April of this year on the right side.  She now reports severe left calf claudication with minimal walking.  She has evidence of significant stenosis in the left proximal SFA.   I discussed with her the option of continuing medical therapy for now and addressing her hyperlipidemia before considering revascularization.  However, she feels severely limited by her symptoms and wants relief of her symptoms.  Due to that, we will proceed with abdominal aortogram with lower extremity runoff and possible endovascular intervention.  Continue clopidogrel without aspirin. Planned access is via the right common femoral artery.  2.  Coronary artery disease involving native coronary arteries: Status post CABG in 2017 .  Recent unstable angina with severe stenosis and SVG to right PDA which was not amenable to PCI.  The other grafts were patent.  Continue medical therapy.  3. Familial hyperlipidemia with severely elevated LDL: Intolerance to statins.  She is managed by Dr. Debara Pickett.  She reports that she got approval from her insurance to start  [place].   4.  Essential hypertension: Blood pressure is reasonably controlled.  She reports significant fluctuations in her blood pressure at home.    Disposition:   FU with me after angiogram.  Signed,  [name], MD  09/07/2021 10:52 AM    [place]

## 2021-09-07 NOTE — Patient Instructions (Signed)
Medication Instructions:  No changes *If you need a refill on your cardiac medications before your next appointment, please call your pharmacy*  Testing/Procedures: Your physician has requested that you have a peripheral vascular angiogram. This exam is performed at the hospital. During this exam IV contrast is used to look at arterial blood flow. Please review the information sheet given for details.  Follow-Up: At [facility], you and your health needs are our priority.  As part of our continuing mission to provide you with exceptional heart care, we have created designated Provider Care Teams.  These Care Teams include your primary Cardiologist (physician) and Advanced Practice Providers (APPs -  Physician Assistants and Nurse Practitioners) who all work together to provide you with the care you need, when you need it.  We recommend signing up for the patient portal called "MyChart".  Sign up information is provided on this After Visit Summary.  MyChart is used to connect with patients for Virtual Visits (Telemedicine).  Patients are able to view lab/test results, encounter notes, upcoming appointments, etc.  Non-urgent messages can be sent to your provider as well.   To learn more about what you can do with MyChart, go to [email or web address].    Your next appointment:   Keep your post procedure follow up with Dr. Fletcher Anon on 11/15 at 9:20 am   [address] [place] [address] Dept: [phone] Loc: [place]  09/07/2021  You are scheduled for a Peripheral Angiogram on Wednesday, October 12 with Dr. [name].  1. Please arrive at the [facility] (Main Entrance A) at [facility]: [address] [place], [place] [address] at 8:30 AM (This time is two hours before your procedure to ensure your preparation). Free valet parking service is available.    Special note: Every effort is made to have your procedure done on time. Please understand that emergencies sometimes delay scheduled procedures.  2. Diet: Do not eat solid foods after midnight.  The patient may have clear liquids until 5am upon the day of the procedure.  3. Labs: You will need to have blood drawn on 09/07/21  4. Medication instructions in preparation for your procedure: Hold the Hydrochlorothiazide the morning of the procedure  On the morning of your procedure, take your Aspirin and Plavix/Clopidogrel and any morning medicines NOT listed above.  You may use sips of water.  5. Plan for one night stay--bring personal belongings. 6. Bring a current list of your medications and current insurance cards. 7. You MUST have a responsible person to drive you home. 8. Someone MUST be with you the first 24 hours after you arrive home or your discharge will be delayed. 9. Please wear clothes that are easy to get on and off and wear slip-on shoes.  Thank you for allowing [place] to care for you!   -- [facility]

## 2021-09-08 LAB — BASIC METABOLIC PANEL
BUN/Creatinine Ratio: 15 (ref 12–28)
BUN: 14 mg/dL (ref 8–27)
CO2: 21 mmol/L (ref 20–29)
Calcium: 9.6 mg/dL (ref 8.7–10.3)
Chloride: 100 mmol/L (ref 96–106)
Creatinine, Ser: 0.96 mg/dL (ref 0.57–1.00)
Glucose: 85 mg/dL (ref 70–99)
Potassium: 5.2 mmol/L (ref 3.5–5.2)
Sodium: 139 mmol/L (ref 134–144)
eGFR: 60 mL/min/{1.73_m2} (ref >59)

## 2021-09-08 LAB — CBC
Hematocrit: 35.6 % (ref 34.0–46.6)
Hemoglobin: 11.6 g/dL (ref 11.1–15.9)
MCH: 29.7 pg (ref 26.6–33.0)
MCHC: 32.6 g/dL (ref 31.5–35.7)
MCV: 91 fL (ref 79–97)
Platelets: 378 10*3/uL (ref 150–450)
RBC: 3.91 x10E6/uL (ref 3.77–5.28)
RDW: 15.5 % — ABNORMAL HIGH (ref 11.7–15.4)
WBC: 6.2 10*3/uL (ref 3.4–10.8)

## 2021-09-14 ENCOUNTER — Telehealth: Payer: Self-pay | Admitting: *Deleted

## 2021-09-14 ENCOUNTER — Ambulatory Visit: Payer: Medicare Other | Admitting: Cardiovascular Disease

## 2021-09-14 NOTE — Telephone Encounter (Signed)
Cardiac catheterization scheduled at [facility] for: Wednesday September 15, 2021 10:30 AM [facility] Main Entrance A [facility]) at: 8:30 AM   No solid food after midnight prior to cath, clear liquids until 5 AM day of procedure.  Medications instructions: Hold: HCTZ-AM of procedure  Except hold medications usual morning medications can be taken pre-cath with sips of water including: - aspirin 81 mg -Plavix 75 mg     Confirmed patient has responsible adult to drive home post procedure and be with patient first 24 hours after arriving home.  [facility] does allow one visitor to accompany you and wait in the hospital waiting room while you are there for your procedure. You and your visitor will be asked to wear a mask once you enter the hospital.   Patient reports does not currently have any symptoms concerning for COVID-19 and no household members with COVID-19 like illness.      Reviewed procedure/mask/visitor instructions with patient.

## 2021-09-14 NOTE — Telephone Encounter (Signed)
Patient approved for [facility]

## 2021-09-15 ENCOUNTER — Ambulatory Visit (HOSPITAL_COMMUNITY)
Admission: RE | Admit: 2021-09-15 | Discharge: 2021-09-15 | Disposition: A | Discharge status: Home / Self Care | Payer: Medicare Other | Attending: Cardiovascular Disease | Admitting: Cardiovascular Disease

## 2021-09-15 ENCOUNTER — Other Ambulatory Visit: Payer: Self-pay | Admitting: *Deleted

## 2021-09-15 ENCOUNTER — Other Ambulatory Visit: Payer: Self-pay

## 2021-09-15 ENCOUNTER — Encounter (HOSPITAL_COMMUNITY)
Admission: RE | Disposition: A | Discharge status: Home / Self Care | Payer: Self-pay | Source: Home / Self Care | Attending: Cardiovascular Disease

## 2021-09-15 DIAGNOSIS — I10 Essential (primary) hypertension: Secondary | ICD-10-CM | POA: Diagnosis not present

## 2021-09-15 DIAGNOSIS — Z951 Presence of aortocoronary bypass graft: Secondary | ICD-10-CM | POA: Diagnosis not present

## 2021-09-15 DIAGNOSIS — E7849 Other hyperlipidemia: Secondary | ICD-10-CM | POA: Insufficient documentation

## 2021-09-15 DIAGNOSIS — Z880 Allergy status to penicillin: Secondary | ICD-10-CM | POA: Diagnosis not present

## 2021-09-15 DIAGNOSIS — I2510 Atherosclerotic heart disease of native coronary artery without angina pectoris: Secondary | ICD-10-CM | POA: Diagnosis not present

## 2021-09-15 DIAGNOSIS — Z7902 Long term (current) use of antithrombotics/antiplatelets: Secondary | ICD-10-CM | POA: Insufficient documentation

## 2021-09-15 DIAGNOSIS — Z888 Allergy status to other drugs, medicaments and biological substances status: Secondary | ICD-10-CM | POA: Insufficient documentation

## 2021-09-15 DIAGNOSIS — Z8249 Family history of ischemic heart disease and other diseases of the circulatory system: Secondary | ICD-10-CM | POA: Insufficient documentation

## 2021-09-15 DIAGNOSIS — Z79899 Other long term (current) drug therapy: Secondary | ICD-10-CM | POA: Diagnosis not present

## 2021-09-15 DIAGNOSIS — E7801 Familial hypercholesterolemia: Secondary | ICD-10-CM | POA: Diagnosis not present

## 2021-09-15 DIAGNOSIS — I70212 Atherosclerosis of native arteries of extremities with intermittent claudication, left leg: Secondary | ICD-10-CM

## 2021-09-15 DIAGNOSIS — I739 Peripheral vascular disease, unspecified: Secondary | ICD-10-CM

## 2021-09-15 DIAGNOSIS — Z79890 Hormone replacement therapy (postmenopausal): Secondary | ICD-10-CM | POA: Diagnosis not present

## 2021-09-15 HISTORY — PX: ABDOMINAL AORTOGRAM W/LOWER EXTREMITY: CATH118223

## 2021-09-15 HISTORY — PX: PERIPHERAL VASCULAR ATHERECTOMY: CATH118256

## 2021-09-15 LAB — POCT ACTIVATED CLOTTING TIME
Activated Clotting Time: 248 seconds
Activated Clotting Time: 219 seconds

## 2021-09-15 SURGERY — ABDOMINAL AORTOGRAM W/LOWER EXTREMITY
Anesthesia: LOCAL

## 2021-09-15 MED ORDER — SODIUM CHLORIDE 0.9% FLUSH: 3.0000 mL | INTRAVENOUS | Status: DC | PRN

## 2021-09-15 MED ORDER — SODIUM CHLORIDE 0.9 % IV SOLN: 250.0000 mL | INTRAVENOUS | Status: DC | PRN

## 2021-09-15 MED ORDER — MIDAZOLAM HCL 2 MG/2ML IJ SOLN
INTRAMUSCULAR | Status: AC
  Filled 2021-09-15: qty 2

## 2021-09-15 MED ORDER — HEPARIN (PORCINE) IN NACL 1000-0.9 UT/500ML-% IV SOLN
INTRAVENOUS | Status: AC
  Filled 2021-09-15: qty 1000

## 2021-09-15 MED ORDER — SODIUM CHLORIDE 0.9% FLUSH: 3.0000 mL | Freq: Two times a day (BID) | INTRAVENOUS | Status: DC

## 2021-09-15 MED ORDER — IODIXANOL 320 MG/ML IV SOLN
INTRAVENOUS | Status: DC | PRN
  Administered 2021-09-15: 125 mL via INTRA_ARTERIAL

## 2021-09-15 MED ORDER — NITROGLYCERIN 1 MG/10 ML FOR IR/CATH LAB
INTRA_ARTERIAL | Status: DC | PRN
  Administered 2021-09-15: 400 ug via INTRA_ARTERIAL

## 2021-09-15 MED ORDER — HEPARIN (PORCINE) IN NACL 1000-0.9 UT/500ML-% IV SOLN
INTRAVENOUS | Status: DC | PRN
  Administered 2021-09-15 (×2): 500 mL

## 2021-09-15 MED ORDER — ONDANSETRON HCL 4 MG/2ML IJ SOLN: 4.0000 mg | Freq: Four times a day (QID) | INTRAMUSCULAR | Status: DC | PRN

## 2021-09-15 MED ORDER — ACETAMINOPHEN 325 MG PO TABS: 650.0000 mg | ORAL_TABLET | ORAL | Status: DC | PRN

## 2021-09-15 MED ORDER — FENTANYL CITRATE (PF) 100 MCG/2ML IJ SOLN
INTRAMUSCULAR | Status: AC
  Filled 2021-09-15: qty 2

## 2021-09-15 MED ORDER — SODIUM CHLORIDE 0.9 % IV SOLN: INTRAVENOUS | Status: AC

## 2021-09-15 MED ORDER — FENTANYL CITRATE (PF) 100 MCG/2ML IJ SOLN
INTRAMUSCULAR | Status: DC | PRN
  Administered 2021-09-15 (×2): 25 ug via INTRAVENOUS

## 2021-09-15 MED ORDER — SODIUM CHLORIDE 0.9 % WEIGHT BASED INFUSION: 1.0000 mL/kg/h | INTRAVENOUS | Status: DC

## 2021-09-15 MED ORDER — HEPARIN SODIUM (PORCINE) 1000 UNIT/ML IJ SOLN
INTRAMUSCULAR | Status: DC | PRN
  Administered 2021-09-15: 4000 [IU] via INTRAVENOUS
  Administered 2021-09-15: 1000 [IU] via INTRAVENOUS

## 2021-09-15 MED ORDER — ASPIRIN 81 MG PO CHEW
81.0000 mg | CHEWABLE_TABLET | ORAL | Status: AC
  Administered 2021-09-15: 81 mg via ORAL
  Filled 2021-09-15: qty 1

## 2021-09-15 MED ORDER — MIDAZOLAM HCL 2 MG/2ML IJ SOLN
INTRAMUSCULAR | Status: DC | PRN
  Administered 2021-09-15: 1 mg via INTRAVENOUS

## 2021-09-15 MED ORDER — LIDOCAINE HCL (PF) 1 % IJ SOLN
INTRAMUSCULAR | Status: AC
  Filled 2021-09-15: qty 30

## 2021-09-15 MED ORDER — SODIUM CHLORIDE 0.9 % WEIGHT BASED INFUSION
3.0000 mL/kg/h | INTRAVENOUS | Status: AC
  Administered 2021-09-15: 3 mL/kg/h via INTRAVENOUS

## 2021-09-15 MED ORDER — LIDOCAINE HCL (PF) 1 % IJ SOLN
INTRAMUSCULAR | Status: DC | PRN
  Administered 2021-09-15: 5 mL
  Administered 2021-09-15: 20 mL

## 2021-09-15 SURGICAL SUPPLY — 30 items
BALLN COYOTE OTW 4X60X150 (BALLOONS) ×3
BALLN COYOTE OTW 4X80X150 (BALLOONS) ×3
BALLN IN.PACT DCB 5X120 (BALLOONS) ×3
BALLN IN.PACT DCB 5X80 (BALLOONS) ×3
BALLOON COYOTE OTW 4X60X150 (BALLOONS) ×2 IMPLANT
BALLOON COYOTE OTW 4X80X150 (BALLOONS) ×2 IMPLANT
CATH ANGIO 5F PIGTAIL 65CM (CATHETERS) ×3 IMPLANT
CATH CROSS OVER TEMPO 5F (CATHETERS) ×3 IMPLANT
CATH STRAIGHT 5FR 65CM (CATHETERS) ×3 IMPLANT
CLOSURE PERCLOSE PROSTYLE (VASCULAR PRODUCTS) ×3 IMPLANT
DCB IN.PACT 5X120 (BALLOONS) ×2 IMPLANT
DCB IN.PACT 5X80 (BALLOONS) ×2 IMPLANT
DEVICE EMBOSHIELD NAV6 4.0-7.0 (FILTER) ×3 IMPLANT
DIAMONDBACK SOLID OAS 1.5MM (CATHETERS) ×3
KIT ENCORE 26 ADVANTAGE (KITS) ×3 IMPLANT
KIT PV (KITS) ×3 IMPLANT
LUBRICANT VIPERSLIDE CORONARY (MISCELLANEOUS) ×3 IMPLANT
SET INTRODUCER MICROPUNCT 5F (INTRODUCER) ×3 IMPLANT
SHEATH PINNACLE 5F 10CM (SHEATH) ×3 IMPLANT
SHEATH PINNACLE MP 7F 45CM (SHEATH) ×3 IMPLANT
SHEATH PROBE COVER 6X72 (BAG) ×3 IMPLANT
SYR MEDRAD MARK 7 150ML (SYRINGE) ×3 IMPLANT
SYSTEM DIMNDBCK SLD OAS 1.5MM (CATHETERS) ×2 IMPLANT
TAPE VIPERTRACK RADIOPAQ (MISCELLANEOUS) ×2 IMPLANT
TAPE VIPERTRACK RADIOPAQUE (MISCELLANEOUS) ×1
TRANSDUCER W/STOPCOCK (MISCELLANEOUS) ×3 IMPLANT
TRAY PV CATH (CUSTOM PROCEDURE TRAY) ×3 IMPLANT
WIRE HITORQ VERSACORE ST 145CM (WIRE) ×3 IMPLANT
WIRE ROSEN-J .035X180CM (WIRE) ×3 IMPLANT
WIRE VIPER ADVANCE .017X335CM (WIRE) ×3 IMPLANT

## 2021-09-15 NOTE — Telephone Encounter (Signed)
Spoke with Lajean Silvius with [employer] benefits regarding PA status  Patient's PA for Leqvio approved 08/17/21 - 08/17/22  Loma Sousa will run a benefits investigation again for patient

## 2021-09-15 NOTE — Progress Notes (Signed)
Pt ambulated without difficulty or bleeding.   Discharged home with her husband who will drive and stay with pt x 24 hrs. 

## 2021-09-15 NOTE — Interval H&P Note (Signed)
History and Physical Interval Note:  09/15/2021 10:39 AM  Katherine Hall  has presented today for surgery, with the diagnosis of PAD.  The various methods of treatment have been discussed with the patient and family. After consideration of risks, benefits and other options for treatment, the patient has consented to  Procedure(s): ABDOMINAL AORTOGRAM W/LOWER EXTREMITY (N/A) as a surgical intervention.  The patient's history has been reviewed, patient examined, no change in status, stable for surgery.  I have reviewed the patient's chart and labs.  Questions were answered to the patient's satisfaction.     [name]

## 2021-09-15 NOTE — Discharge Instructions (Signed)
Femoral Site Care This sheet gives you information about how to care for yourself after your procedure. Your health care provider may also give you more specific instructions. If you have problems or questions, contact your health care provider. What can I expect after the procedure?  After the procedure, it is common to have: Bruising that usually fades within 1-2 weeks. Tenderness at the site. Follow these instructions at home: Wound care Follow instructions from your health care provider about how to take care of your insertion site. Make sure you: Wash your hands with soap and water before you change your bandage (dressing). If soap and water are not available, use hand sanitizer. Remove your dressing as told by your health care provider. In 24 hours Do not take baths, swim, or use a hot tub until your health care provider approves. You may shower 24-48 hours after the procedure or as told by your health care provider. Gently wash the site with plain soap and water. Pat the area dry with a clean towel. Do not rub the site. This may cause bleeding. Do not apply powder or lotion to the site. Keep the site clean and dry. Check your femoral site every day for signs of infection. Check for: Redness, swelling, or pain. Fluid or blood. Warmth. Pus or a bad smell. Activity For the first 2-3 days after your procedure, or as long as directed: Avoid climbing stairs as much as possible. Do not squat. Do not lift anything that is heavier than 10 lb (4.5 kg), or the limit that you are told, until your health care provider says that it is safe. For 5 days Rest as directed. Avoid sitting for a long time without moving. Get up to take short walks every 1-2 hours. Do not drive for 24 hours if you were given a medicine to help you relax (sedative). General instructions Take over-the-counter and prescription medicines only as told by your health care provider. Keep all follow-up visits as told by  your health care provider. This is important. Contact a health care provider if you have: A fever or chills. You have redness, swelling, or pain around your insertion site. Get help right away if: The catheter insertion area swells very fast. You pass out. You suddenly start to sweat or your skin gets clammy. The catheter insertion area is bleeding, and the bleeding does not stop when you hold steady pressure on the area. The area near or just beyond the catheter insertion site becomes pale, cool, tingly, or numb. These symptoms may represent a serious problem that is an emergency. Do not wait to see if the symptoms will go away. Get medical help right away. Call your local emergency services (911 in the [place] Do not drive yourself to the hospital. Summary After the procedure, it is common to have bruising that usually fades within 1-2 weeks. Check your femoral site every day for signs of infection. Do not lift anything that is heavier than 10 lb (4.5 kg), or the limit that you are told, until your health care provider says that it is safe. This information is not intended to replace advice given to you by your health care provider. Make sure you discuss any questions you have with your health care provider. Document Revised: 12/04/2017 Document Reviewed: 12/04/2017 Elsevier Patient Education  2020 Elsevier Inc. 

## 2021-09-16 ENCOUNTER — Encounter (HOSPITAL_COMMUNITY): Payer: Self-pay | Admitting: Cardiovascular Disease

## 2021-09-21 NOTE — Telephone Encounter (Signed)
Received benefits investigation report Patient has met $1966.38 of the $4500 OOP max for calendar year 2022  Spoke with patient and explained her benefits info. Will mail her patient assistance application - she has a doppler on 10/27 - advised she could bring the paperwork to this appointment.

## 2021-09-24 ENCOUNTER — Other Ambulatory Visit: Payer: Self-pay | Admitting: Cardiovascular Disease

## 2021-09-30 ENCOUNTER — Ambulatory Visit (HOSPITAL_COMMUNITY)
Admission: RE | Admit: 2021-09-30 | Discharge: 2021-09-30 | Disposition: A | Discharge status: Home / Self Care | Payer: Medicare Other | Source: Ambulatory Visit | Attending: Cardiology | Admitting: Cardiology

## 2021-09-30 ENCOUNTER — Other Ambulatory Visit: Payer: Self-pay | Admitting: Cardiovascular Disease

## 2021-09-30 ENCOUNTER — Other Ambulatory Visit: Payer: Self-pay

## 2021-09-30 DIAGNOSIS — Z9862 Peripheral vascular angioplasty status: Secondary | ICD-10-CM

## 2021-09-30 DIAGNOSIS — I739 Peripheral vascular disease, unspecified: Secondary | ICD-10-CM | POA: Diagnosis present

## 2021-10-06 ENCOUNTER — Telehealth: Payer: Self-pay | Admitting: Internal Medicine

## 2021-10-06 NOTE — Telephone Encounter (Signed)
[employer] PAF application for [employer] faxed

## 2021-10-12 ENCOUNTER — Telehealth: Payer: Self-pay | Admitting: Cardiovascular Disease

## 2021-10-12 NOTE — Telephone Encounter (Signed)
Called the provided number, spoke with Katherine Hall, pt needs to apply through hub. If the pt is approved through the hub they will cover it. If not approved they will forward the information to [employer] Patient Assistance Program. This is mandatory for the pt before she can apply through [employer].  [facility] [phone].  Will call pt to let her know the information.

## 2021-10-12 NOTE — Telephone Encounter (Signed)
Called pt  to relay the information about the [facility]. She will call them and see what she needs to do.

## 2021-10-12 NOTE — Telephone Encounter (Signed)
New Message:      Please call Brandy from [facility] Patient Assistance Program, concerning patient's Leqvio.

## 2021-10-19 ENCOUNTER — Encounter: Payer: Self-pay | Admitting: Cardiovascular Disease

## 2021-10-19 ENCOUNTER — Other Ambulatory Visit: Payer: Self-pay

## 2021-10-19 ENCOUNTER — Ambulatory Visit: Payer: Medicare Other | Admitting: Cardiovascular Disease

## 2021-10-19 VITALS — BP 156/82 | HR 65 | Ht <= 58 in | Wt 129.8 lb

## 2021-10-19 DIAGNOSIS — I2510 Atherosclerotic heart disease of native coronary artery without angina pectoris: Secondary | ICD-10-CM | POA: Diagnosis not present

## 2021-10-19 DIAGNOSIS — I10 Essential (primary) hypertension: Secondary | ICD-10-CM | POA: Diagnosis not present

## 2021-10-19 DIAGNOSIS — E7801 Familial hypercholesterolemia: Secondary | ICD-10-CM | POA: Diagnosis not present

## 2021-10-19 DIAGNOSIS — I739 Peripheral vascular disease, unspecified: Secondary | ICD-10-CM | POA: Diagnosis not present

## 2021-10-19 NOTE — Progress Notes (Signed)
Cardiology Office Note   Date:  10/19/2021   ID:  Katherine Hall, DOB 06/30/1941, MRN [id number]  PCP:  Stacie Glaze, DO  Cardiologist: Dr. Acie Fredrickson  No chief complaint on file.     History of Present Illness: Katherine Hall is a 80 y.o. female who is here today for follow-up regarding peripheral arterial disease.   She has known history of coronary artery disease status post CABG in 2017, essential hypertension, anemia and hyperlipidemia with intolerance to statins.  She is not diabetic and is a lifelong non-smoker. The patient was seen in late 2020 for severe left calf claudication.  Vascular studies showed an ABI of 0.96 on the right and 0.85 on the left with evidence of severe left SFA stenosis. Lower extremity angiography in December 2020 showed mild to moderate iliac disease, on the right side, there was mild diffuse SFA and popliteal disease with one-vessel runoff below the knee.  On the left side there was severe mid SFA stenosis and one-vessel runoff below the knee.  I performed successful drug-coated balloon angioplasty to the mid left SFA.   She was hospitalized in March, 2022 with unstable angina.  Cardiac catheterization showed severe underlying three-vessel coronary artery disease with patent grafts including LIMA to LAD, SVG to OM1/OM 3, SVG to diagonal with 60% anastomosis stenosis and SVG to right PDA.  There was 90% stenosis in the proximal portion of the SVG to right PDA followed by another 90% stenosis leading into an aneurysmal segment which was followed by a 95% stenosis.  I attempted angioplasty of SVG to right PDA but I was not able to cross the stenosis with multiple wires beyond the aneurysmal segment.  Subsequently, there was loss of flow and acute closure with wire manipulation.  The patient was treated medically and did reasonably well.    She was most recently in early this year for severe right calf claudication with evidence of SFA disease.  Angiography in  April of 2022 showed no obstructive aortoiliac disease, severe stenosis in the right proximal/mid SFA with one-vessel runoff below the knee via the anterior tibial artery.  I performed successful drug-coated balloon angioplasty to the right SFA.  She had resolution of right calf claudication.   She was hospitalized in June with GI bleed requiring transfusion.   Plavix was held and then was resumed without aspirin.  No bleeding since then.  She is seen recently for worsening left leg claudication.  Doppler studies showed an ABI of 0.74 on the right and 0.63 on the left.  Duplex on the right showed moderate popliteal and TP trunk stenosis.  On the left side, there was severe stenosis in the proximal SFA.  I proceeded with angiography last month which showed severe left SFA diffuse stenosis which was treated successfully with orbital atherectomy and drug-coated balloon angioplasty.  Postprocedure ABI improved to 0.92 on the left and 0.8 on the right.  She reports significant improvement of left leg pain although she continues to report limitation related to the right calf exertional pain.  She seems to be very anxious about her symptoms.   Past Medical History:  Diagnosis Date   Anemia    Arthritis    Coronary artery disease    s/p CABG in 2017 // Myoview 11/21: No ischemia or infarction, EF > 65, low risk  // [place] >> cath 3/22: S-RPDA w severe disease - PCI unsuccessful & c/b peri-procedure MI (acute closure w wire manipulation>>inf ST elev) >> Med  Rx    Echocardiogram 02/2021    EF 65-70, no RWMA, mild LVH, Gr 1 DD, normal RVSF, RVSP 19.5, mild AI, mild AV sclerosis   Familial hypercholesterolemia    a. h/o intolerance of statins, prev on Repatha but insurance stopped covering.   Gastric ulcer    a. h/o bleeding ulcer 2009 requiring 3 pints of blood.   History of removal of cyst    a. 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   HTN (hypertension)    Hypothyroidism     PAD (peripheral artery disease) (HCC)    S/p L SFA PTA // ABIs/LE arterial US 4/22: R 0.7; L 0.86 // R SFA 75-99; L SFA angioplasty site 30-49, distal 50-74 // ectatic distal aorta 2.6 x 2.6 cm   Prediabetes     Past Surgical History:  Procedure Laterality Date   ABDOMINAL AORTOGRAM W/LOWER EXTREMITY Bilateral 11/06/2019   Procedure: ABDOMINAL AORTOGRAM W/LOWER EXTREMITY;  Surgeon: Wellington Hampshire, MD;  Location: [place] [facility];  Service: Cardiovascular;  Laterality: Bilateral;   ABDOMINAL AORTOGRAM W/LOWER EXTREMITY N/A 03/31/2021   Procedure: ABDOMINAL AORTOGRAM W/LOWER EXTREMITY;  Surgeon: Wellington Hampshire, MD;  Location: [place] [facility];  Service: Cardiovascular;  Laterality: N/A;   ABDOMINAL AORTOGRAM W/LOWER EXTREMITY N/A 09/15/2021   Procedure: ABDOMINAL AORTOGRAM W/LOWER EXTREMITY;  Surgeon: Wellington Hampshire, MD;  Location: [place] [facility];  Service: Cardiovascular;  Laterality: N/A;   ABDOMINAL HYSTERECTOMY     APPENDECTOMY     CARDIAC CATHETERIZATION N/A 07/05/2016   Procedure: Left Heart Cath and Coronary Angiography;  Surgeon: Burnell Blanks, MD;  Location: [place] [facility];  Service: Cardiovascular;  Laterality: N/A;   CARDIAC SURGERY     cyst, not on heart.   CORONARY ARTERY BYPASS GRAFT N/A 07/07/2016   Procedure: CORONARY ARTERY BYPASS GRAFTING (CABG) x 5 with endoscopic harvesting of the right greater saphenous vein;  Surgeon: Gaye Pollack, MD;  Location: [place] [facility];  Service: Open Heart Surgery;  Laterality: N/A;   CORONARY BALLOON ANGIOPLASTY N/A 02/03/2021   Procedure: CORONARY BALLOON ANGIOPLASTY;  Surgeon: Wellington Hampshire, MD;  Location: [place] [facility];  Service: Cardiovascular;  Laterality: N/A;  svg to pda   ESOPHAGOGASTRODUODENOSCOPY (EGD) WITH PROPOFOL N/A 05/08/2021   Procedure: ESOPHAGOGASTRODUODENOSCOPY (EGD) WITH PROPOFOL;  Surgeon: Arta Silence, MD;  Location: [facility];  Service: Endoscopy;  Laterality: N/A;   HEMORRHOID SURGERY      INTRAOPERATIVE TRANSESOPHAGEAL ECHOCARDIOGRAM N/A 07/07/2016   Procedure: INTRAOPERATIVE TRANSESOPHAGEAL ECHOCARDIOGRAM;  Surgeon: Gaye Pollack, MD;  Location: [place] [facility];  Service: Open Heart Surgery;  Laterality: N/A;   LEFT HEART CATH AND CORS/GRAFTS ANGIOGRAPHY N/A 02/03/2021   Procedure: LEFT HEART CATH AND CORS/GRAFTS ANGIOGRAPHY;  Surgeon: Wellington Hampshire, MD;  Location: [place] [facility];  Service: Cardiovascular;  Laterality: N/A;   PERIPHERAL VASCULAR ATHERECTOMY Left 09/15/2021   Procedure: PERIPHERAL VASCULAR ATHERECTOMY;  Surgeon: Wellington Hampshire, MD;  Location: [place] [facility];  Service: Cardiovascular;  Laterality: Left;   PERIPHERAL VASCULAR BALLOON ANGIOPLASTY  11/06/2019   Procedure: PERIPHERAL VASCULAR BALLOON ANGIOPLASTY;  Surgeon: Wellington Hampshire, MD;  Location: [place] [facility];  Service: Cardiovascular;;  cutting and dcb   PERIPHERAL VASCULAR BALLOON ANGIOPLASTY Right 03/31/2021   Procedure: PERIPHERAL VASCULAR BALLOON ANGIOPLASTY;  Surgeon: Wellington Hampshire, MD;  Location: [place] [facility];  Service: Cardiovascular;  Laterality: Right;  SFA   SHOULDER SURGERY  Current Outpatient Medications  Medication Sig Dispense Refill   acetaminophen (TYLENOL) 650 MG CR tablet Take 1,300 mg by mouth in the morning and at bedtime.     acyclovir (ZOVIRAX) 400 MG tablet Take 800 mg by mouth daily as needed (fever blisters).      alendronate (FOSAMAX) 70 MG tablet Take 70 mg by mouth every Friday.     benzonatate (TESSALON) 200 MG capsule Take 200 mg by mouth 3 (three) times daily as needed for cough.     Carboxymethylcellul-Glycerin (LUBRICATING EYE DROPS OP) Place 1 drop into both eyes daily as needed (dry eyes).     clopidogrel (PLAVIX) 75 MG tablet Take 1 tablet (75 mg total) by mouth daily. Resume only after 5 days ( on 05/13/21) 90 tablet 3   diphenhydrAMINE (BENADRYL) 25 MG tablet Take 25 mg by mouth daily as needed for allergies.     diphenhydramine-acetaminophen  (TYLENOL PM) 25-500 MG TABS tablet Take 2 tablets by mouth at bedtime.     fluticasone (FLONASE) 50 MCG/ACT nasal spray Place 1 spray into both nostrils 2 (two) times daily.     hydrochlorothiazide (HYDRODIURIL) 25 MG tablet TAKE 1 TABLET BY MOUTH  DAILY 90 tablet 3   isosorbide mononitrate (IMDUR) 30 MG 24 hr tablet Take 30 mg by mouth daily.     levothyroxine (SYNTHROID, LEVOTHROID) 50 MCG tablet Take 50 mcg by mouth daily before breakfast.     metoprolol tartrate (LOPRESSOR) 25 MG tablet TAKE 1/2 TABLET(12.5 MG) BY MOUTH TWICE DAILY 45 tablet 3   nitroGLYCERIN (NITROSTAT) 0.4 MG SL tablet Place 1 tablet (0.4 mg total) under the tongue every 5 (five) minutes x 3 doses as needed for chest pain. 25 tablet 1   pantoprazole (PROTONIX) 40 MG tablet Take 40 mg by mouth 2 (two) times daily.     rOPINIRole (REQUIP) 2 MG tablet Take 2 mg by mouth at bedtime.     traMADol (ULTRAM) 50 MG tablet Take 1 tablet (50 mg total) by mouth every 6 (six) hours as needed for severe pain. (Patient taking differently: Take 50 mg by mouth daily.) 28 tablet 0   No current facility-administered medications for this visit.    Allergies:   Penicillins, Lincomycin, Naproxen sodium, Statins, Latex, and Tape    Social History:  The patient  reports that she has never smoked. She has never used smokeless tobacco. She reports that she does not drink alcohol and does not use drugs.   Family History:  The patient's family history includes CAD in her father; Coronary artery disease in her brother and sister; Stroke in her mother.    ROS:  Please see the history of present illness.   Otherwise, review of systems are positive for none.   All other systems are reviewed and negative.    PHYSICAL EXAM: VS:  BP (!) 156/82   Pulse 65   Ht 4\' 8"  (1.422 m)   Wt 129 lb 12.8 oz (58.9 kg)   SpO2 100%   BMI 29.10 kg/m  , BMI Body mass index is 29.1 kg/m. GEN: Well nourished, well developed, in no acute distress  HEENT: normal   Neck: no JVD, carotid bruits, or masses Cardiac: RRR; no murmurs, rubs, or gallops,no edema  Respiratory:  clear to auscultation bilaterally, normal work of breathing GI: soft, nontender, nondistended, + BS MS: no deformity or atrophy  Skin: warm and dry, no rash Neuro:  Strength and sensation are intact Psych: euthymic mood, full affect Vascular: Femoral  pulses +2 bilaterally.  No groin hematoma.  EKG:  EKG is not ordered today.   Recent Labs: 02/02/2021: TSH 0.960 05/06/2021: ALT 8 09/07/2021: BUN 14; Creatinine, Ser 0.96; Hemoglobin 11.6; Platelets 378; Potassium 5.2; Sodium 139    Lipid Panel    Component Value Date/Time   CHOL 356 (H) 12/10/2020 1007   TRIG 213 (H) 12/10/2020 1007   HDL 71 12/10/2020 1007   CHOLHDL 5.0 (H) 12/10/2020 1007   CHOLHDL 4.6 11/08/2019 0404   VLDL 21 11/08/2019 0404   LDLCALC 243 (H) 12/10/2020 1007      Wt Readings from Last 3 Encounters:  10/19/21 129 lb 12.8 oz (58.9 kg)  09/15/21 124 lb (56.2 kg)  09/07/21 128 lb 9.6 oz (58.3 kg)       No flowsheet data found.    ASSESSMENT AND PLAN:  1.  Peripheral arterial disease: Status post bilateral SFA intervention most recently left SFA orbital atherectomy and drug-coated balloon angioplasty.  Recommend continuing medical therapy.  She has residual disease affecting the right lower extremity but ABI 0.80.  I have advised her to start a walking exercise program and provided her with written instructions.  Reevaluate symptoms in 3 months.  I explained to her that this is not a life-threatening situation and there is no urgency in proceeding with revascularization.  2.  Coronary artery disease involving native coronary arteries: Status post CABG in 2017 .  Recent unstable angina with severe stenosis and SVG to right PDA which was not amenable to PCI.  The other grafts were patent.  Continue medical therapy.  3. Familial hyperlipidemia with severely elevated LDL: Intolerance to statins.  She is  managed by Dr. Debara Pickett.  We will help the patient see if she qualifies for the free first dose of Inclisiran.   4.  Essential hypertension: Blood pressure is reasonably controlled.  She reports significant fluctuations in her blood pressure at home.    Disposition:   FU with me in 3 months.  Signed,  [name], MD  10/19/2021 9:11 AM    [place]

## 2021-10-19 NOTE — Patient Instructions (Signed)
Medication Instructions:  Your physician recommends that you continue on your current medications as directed. Please refer to the Current Medication list given to you today.  *If you need a refill on your cardiac medications before your next appointment, please call your pharmacy*  Follow-Up: At [facility], you and your health needs are our priority.  As part of our continuing mission to provide you with exceptional heart care, we have created designated Provider Care Teams.  These Care Teams include your primary Cardiologist (physician) and Advanced Practice Providers (APPs -  Physician Assistants and Nurse Practitioners) who all work together to provide you with the care you need, when you need it.  We recommend signing up for the patient portal called "MyChart".  Sign up information is provided on this After Visit Summary.  MyChart is used to connect with patients for Virtual Visits (Telemedicine).  Patients are able to view lab/test results, encounter notes, upcoming appointments, etc.  Non-urgent messages can be sent to your provider as well.   To learn more about what you can do with MyChart, go to [email or web address].    Your next appointment:   3 month(s)  The format for your next appointment:   In Person  Provider:   Dr. Fletcher Anon     EXERCISE PROGRAM FOR INDIVIDUALS WITH  PERIPHERAL ARTERIAL DISEASE (PAD)   General Information:   Research in vascular exercise has demonstrated remarkable improvement in symptoms of leg pain (claudication) without expensive or invasive interventions. Regular walking programs are extremely helpful for patients with PAD and intermittent claudication.  These steps are designed to help you get started with a safe and effective program to help you walk farther with less pain:   Walk at least three times a week (preferably every day).  Your goal is to build up to 30-45 minutes of total walking time (not counting rest breaks). It may take you  several weeks to build up your exercise time starting at 5-10 minutes or whatever you can tolerate.  Walk as far as possible using moderate to maximal pain (7-8 on the scale below) as a signal to stop, and resume walking when the pain goes away.  On a treadmill, set the speed and grade at a level that brings on the claudication pain within 3 to 5 minutes. Walk at this rate until you experience claudication of moderate severity, rest until the pain improves, and then resume walking.  Over time, you will be able to walk longer at the designated speed and grade; workload should then be increased until you develop the pain within 3 to 5 minutes once again.  This regimen will induce a significant benefit. Studies have demonstrated that participants may be able to walk up to three or four times farther and have less leg pain, within twelve weeks, by following this protocol.  Pain Scale    0_____1_____2_____3_____4_____5_____6_____7_____8_____9_____10   No Pain                                   Moderate Pain                               Maximal Pain

## 2021-10-22 NOTE — Telephone Encounter (Signed)
Left message for patient to see if she has called to apply for HUB as previously noted.

## 2021-10-22 NOTE — Telephone Encounter (Signed)
Called [employer] to check on status of application.  Phone: [phone]  Rep states application is received. She said patient must start with HUB referral See phone note 11/8 for reference - patient to call and apply Phone: [phone].

## 2021-11-10 ENCOUNTER — Telehealth: Payer: Self-pay | Admitting: Internal Medicine

## 2021-11-10 NOTE — Telephone Encounter (Signed)
Spoke with patient of Dr. Debara Pickett (lipids) and Dr. Fletcher Anon (PV)  Dr. Debara Pickett had been working on patient assistance  Dr. Fletcher Anon had been working on free trial offer  Explained that the FTO process is new and we just received update on how to get this thru the pharmacy that distributes  She states she called [employer] and she was told she was approved and letter was sent. Explained I have not received a letter sating this to date.   Advised will call [employer] to check on this -- per [employer] rep, the application is under review. And IF approved, the application will be applicable for 4970.   Advised the FTO process is still being reviewed and we will keep her updated on this

## 2021-11-10 NOTE — Telephone Encounter (Signed)
Patient would like to speak to Dr. Lysbeth Penner nurse.  She states it's about trying to get her on a new cholesterol medication.

## 2021-11-16 NOTE — Telephone Encounter (Signed)
Per call last week, patient assistance application is pending.

## 2021-11-24 NOTE — Telephone Encounter (Signed)
Spent 1 hour on the phone investigating process for patient assistance process/application as hers has been on hold. Spoke with [employer] PAP rep and [employer] ([place]) via 3 way call and was notified that patient assistance can be initiated once HUB has received notice that medication is covered w/insurance. Will need to call HUB and advise to send to [employer]path or fax in copy of approval letter w/note to send to [place] and then HUB takes care of submitting patient assistance to [employer].   This patient's case will be processed from the HUB >> [employer] today

## 2021-11-30 ENCOUNTER — Other Ambulatory Visit: Payer: Self-pay | Admitting: Cardiovascular Disease

## 2021-12-21 ENCOUNTER — Ambulatory Visit: Payer: Medicare Other | Admitting: Cardiovascular Disease

## 2022-01-18 ENCOUNTER — Other Ambulatory Visit: Payer: Self-pay

## 2022-01-18 ENCOUNTER — Encounter: Payer: Self-pay | Admitting: Cardiovascular Disease

## 2022-01-18 ENCOUNTER — Ambulatory Visit: Payer: Medicare Other | Admitting: Cardiovascular Disease

## 2022-01-18 VITALS — BP 116/64 | HR 65 | Ht <= 58 in | Wt 129.0 lb

## 2022-01-18 DIAGNOSIS — I739 Peripheral vascular disease, unspecified: Secondary | ICD-10-CM

## 2022-01-18 DIAGNOSIS — I25118 Atherosclerotic heart disease of native coronary artery with other forms of angina pectoris: Secondary | ICD-10-CM

## 2022-01-18 DIAGNOSIS — I10 Essential (primary) hypertension: Secondary | ICD-10-CM

## 2022-01-18 DIAGNOSIS — E785 Hyperlipidemia, unspecified: Secondary | ICD-10-CM

## 2022-01-18 LAB — HEPATIC FUNCTION PANEL
ALT: 7 IU/L (ref 0–32)
AST: 17 IU/L (ref 0–40)
Albumin: 4.6 g/dL (ref 3.7–4.7)
Alkaline Phosphatase: 48 IU/L (ref 44–121)
Bilirubin Total: 0.2 mg/dL (ref 0.0–1.2)
Bilirubin, Direct: <0.1 mg/dL (ref 0.00–0.40)
Total Protein: 6.5 g/dL (ref 6.0–8.5)

## 2022-01-18 LAB — LIPID PANEL
Chol/HDL Ratio: 7.6 ratio — ABNORMAL HIGH (ref 0.0–4.4)
Cholesterol, Total: 449 mg/dL — ABNORMAL HIGH (ref 100–199)
HDL: 59 mg/dL (ref >39)
LDL Chol Calc (NIH): 334 mg/dL — ABNORMAL HIGH (ref 0–99)
Triglycerides: 243 mg/dL — ABNORMAL HIGH (ref 0–149)
VLDL Cholesterol Cal: 56 mg/dL — ABNORMAL HIGH (ref 5–40)

## 2022-01-18 NOTE — Addendum Note (Signed)
Addended by: Rush Landmark L on: 01/18/2022 04:00 PM   Modules accepted: Orders

## 2022-01-18 NOTE — Progress Notes (Signed)
Cardiology Office Note   Date:  01/18/2022   ID:  Katherine Hall, DOB 1941/05/19, MRN [id number]  PCP:  Stacie Glaze, DO  Cardiologist: Dr. Acie Fredrickson  Chief Complaint  Patient presents with   Follow-up    3 months.   Headache        Shortness of Breath       History of Present Illness: Katherine Hall is a 81 y.o. female who is here today for follow-up regarding peripheral arterial disease.   She has known history of coronary artery disease status post CABG in 2017, essential hypertension, anemia and hyperlipidemia with intolerance to statins.  She is not diabetic and is a lifelong non-smoker. She was hospitalized in March, 2022 with unstable angina.  Cardiac catheterization showed severe underlying three-vessel coronary artery disease with patent grafts including LIMA to LAD, SVG to OM1/OM 3, SVG to diagonal with 60% anastomosis stenosis and SVG to right PDA.  There was 90% stenosis in the proximal portion of the SVG to right PDA followed by another 90% stenosis leading into an aneurysmal segment which was followed by a 95% stenosis.  I attempted angioplasty of SVG to right PDA but I was not able to cross the stenosis with multiple wires beyond the aneurysmal segment.  Subsequently, there was loss of flow and acute closure with wire manipulation.  The patient was treated medically and did reasonably well.    She is known to have peripheral arterial disease with claudication.  She is status post bilateral SFA endovascular intervention with drug-coated balloon angioplasty.  She was hospitalized in June of 2022 with GI bleed requiring transfusion.   Plavix was held and then was resumed without aspirin.  No bleeding since then.  She reports stable bilateral calf claudication.  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  She had an injection to the left knee recently.  No chest pain or worsening dyspnea.  She reports occasional low blood pressure with low energy.  Past Medical History:   Diagnosis Date   Anemia    Arthritis    Coronary artery disease    s/p CABG in 2017 // Myoview 11/21: No ischemia or infarction, EF > 65, low risk  // [place] >> cath 3/22: S-RPDA w severe disease - PCI unsuccessful & c/b peri-procedure MI (acute closure w wire manipulation>>inf ST elev) >> Med Rx    Echocardiogram 02/2021    EF 65-70, no RWMA, mild LVH, Gr 1 DD, normal RVSF, RVSP 19.5, mild AI, mild AV sclerosis   Familial hypercholesterolemia    a. h/o intolerance of statins, prev on Repatha but insurance stopped covering.   Gastric ulcer    a. h/o bleeding ulcer 2009 requiring 3 pints of blood.   History of removal of cyst    a. 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   HTN (hypertension)    Hypothyroidism    PAD (peripheral artery disease) (HCC)    S/p L SFA PTA // ABIs/LE arterial US 4/22: R 0.7; L 0.86 // R SFA 75-99; L SFA angioplasty site 30-49, distal 50-74 // ectatic distal aorta 2.6 x 2.6 cm   Prediabetes     Past Surgical History:  Procedure Laterality Date   ABDOMINAL AORTOGRAM W/LOWER EXTREMITY Bilateral 11/06/2019   Procedure: ABDOMINAL AORTOGRAM W/LOWER EXTREMITY;  Surgeon: Wellington Hampshire, MD;  Location: [place] [facility];  Service: Cardiovascular;  Laterality: Bilateral;   ABDOMINAL AORTOGRAM W/LOWER EXTREMITY N/A 03/31/2021   Procedure:  ABDOMINAL AORTOGRAM W/LOWER EXTREMITY;  Surgeon: Wellington Hampshire, MD;  Location: [place] [facility];  Service: Cardiovascular;  Laterality: N/A;   ABDOMINAL AORTOGRAM W/LOWER EXTREMITY N/A 09/15/2021   Procedure: ABDOMINAL AORTOGRAM W/LOWER EXTREMITY;  Surgeon: Wellington Hampshire, MD;  Location: [place] [facility];  Service: Cardiovascular;  Laterality: N/A;   ABDOMINAL HYSTERECTOMY     APPENDECTOMY     CARDIAC CATHETERIZATION N/A 07/05/2016   Procedure: Left Heart Cath and Coronary Angiography;  Surgeon: Burnell Blanks, MD;  Location: [place] [facility];  Service: Cardiovascular;  Laterality: N/A;    CARDIAC SURGERY     cyst, not on heart.   CORONARY ARTERY BYPASS GRAFT N/A 07/07/2016   Procedure: CORONARY ARTERY BYPASS GRAFTING (CABG) x 5 with endoscopic harvesting of the right greater saphenous vein;  Surgeon: Gaye Pollack, MD;  Location: [place] [facility];  Service: Open Heart Surgery;  Laterality: N/A;   CORONARY BALLOON ANGIOPLASTY N/A 02/03/2021   Procedure: CORONARY BALLOON ANGIOPLASTY;  Surgeon: Wellington Hampshire, MD;  Location: [place] [facility];  Service: Cardiovascular;  Laterality: N/A;  svg to pda   ESOPHAGOGASTRODUODENOSCOPY (EGD) WITH PROPOFOL N/A 05/08/2021   Procedure: ESOPHAGOGASTRODUODENOSCOPY (EGD) WITH PROPOFOL;  Surgeon: Arta Silence, MD;  Location: [facility];  Service: Endoscopy;  Laterality: N/A;   HEMORRHOID SURGERY     INTRAOPERATIVE TRANSESOPHAGEAL ECHOCARDIOGRAM N/A 07/07/2016   Procedure: INTRAOPERATIVE TRANSESOPHAGEAL ECHOCARDIOGRAM;  Surgeon: Gaye Pollack, MD;  Location: [place] [facility];  Service: Open Heart Surgery;  Laterality: N/A;   LEFT HEART CATH AND CORS/GRAFTS ANGIOGRAPHY N/A 02/03/2021   Procedure: LEFT HEART CATH AND CORS/GRAFTS ANGIOGRAPHY;  Surgeon: Wellington Hampshire, MD;  Location: [place] [facility];  Service: Cardiovascular;  Laterality: N/A;   PERIPHERAL VASCULAR ATHERECTOMY Left 09/15/2021   Procedure: PERIPHERAL VASCULAR ATHERECTOMY;  Surgeon: Wellington Hampshire, MD;  Location: [place] [facility];  Service: Cardiovascular;  Laterality: Left;   PERIPHERAL VASCULAR BALLOON ANGIOPLASTY  11/06/2019   Procedure: PERIPHERAL VASCULAR BALLOON ANGIOPLASTY;  Surgeon: Wellington Hampshire, MD;  Location: [place] [facility];  Service: Cardiovascular;;  cutting and dcb   PERIPHERAL VASCULAR BALLOON ANGIOPLASTY Right 03/31/2021   Procedure: PERIPHERAL VASCULAR BALLOON ANGIOPLASTY;  Surgeon: Wellington Hampshire, MD;  Location: [place] [facility];  Service: Cardiovascular;  Laterality: Right;  SFA   SHOULDER SURGERY       Current Outpatient Medications  Medication Sig Dispense Refill    acetaminophen (TYLENOL) 650 MG CR tablet Take 1,300 mg by mouth in the morning and at bedtime.     acyclovir (ZOVIRAX) 400 MG tablet Take 800 mg by mouth daily as needed (fever blisters).      alendronate (FOSAMAX) 70 MG tablet Take 70 mg by mouth every Friday.     benzonatate (TESSALON) 200 MG capsule Take 200 mg by mouth 3 (three) times daily as needed for cough.     Carboxymethylcellul-Glycerin (LUBRICATING EYE DROPS OP) Place 1 drop into both eyes daily as needed (dry eyes).     clopidogrel (PLAVIX) 75 MG tablet Take 1 tablet (75 mg total) by mouth daily. Resume only after 5 days ( on 05/13/21) 90 tablet 3   diphenhydrAMINE (BENADRYL) 25 MG tablet Take 25 mg by mouth daily as needed for allergies.     diphenhydramine-acetaminophen (TYLENOL PM) 25-500 MG TABS tablet Take 2 tablets by mouth at bedtime.     fluticasone (FLONASE) 50 MCG/ACT nasal spray Place 1 spray into both nostrils 2 (two) times daily.  hydrochlorothiazide (HYDRODIURIL) 25 MG tablet TAKE 1 TABLET BY MOUTH  DAILY 90 tablet 3   isosorbide mononitrate (IMDUR) 30 MG 24 hr tablet Take 30 mg by mouth daily.     levothyroxine (SYNTHROID, LEVOTHROID) 50 MCG tablet Take 50 mcg by mouth daily before breakfast.     metoprolol tartrate (LOPRESSOR) 25 MG tablet TAKE 1/2 TABLET(12.5 MG) BY MOUTH TWICE DAILY 45 tablet 3   nitroGLYCERIN (NITROSTAT) 0.4 MG SL tablet Place 1 tablet (0.4 mg total) under the tongue every 5 (five) minutes x 3 doses as needed for chest pain. 25 tablet 1   pantoprazole (PROTONIX) 40 MG tablet Take 40 mg by mouth 2 (two) times daily.     rOPINIRole (REQUIP) 2 MG tablet Take 2 mg by mouth at bedtime.     traMADol (ULTRAM) 50 MG tablet Take 1 tablet (50 mg total) by mouth every 6 (six) hours as needed for severe pain. (Patient taking differently: Take 50 mg by mouth daily.) 28 tablet 0   No current facility-administered medications for this visit.    Allergies:   Penicillins, Lincomycin, Naproxen sodium, Statins,  Latex, and Tape    Social History:  The patient  reports that she has never smoked. She has never used smokeless tobacco. She reports that she does not drink alcohol and does not use drugs.   Family History:  The patient's family history includes CAD in her father; Coronary artery disease in her brother and sister; Stroke in her mother.    ROS:  Please see the history of present illness.   Otherwise, review of systems are positive for none.   All other systems are reviewed and negative.    PHYSICAL EXAM: VS:  BP 116/64 (BP Location: Left Arm, Patient Position: Sitting, Cuff Size: Normal)    Pulse 65    Ht 4\' 8"  (1.422 m)    Wt 129 lb (58.5 kg)    BMI 28.92 kg/m  , BMI Body mass index is 28.92 kg/m. GEN: Well nourished, well developed, in no acute distress  HEENT: normal  Neck: no JVD, carotid bruits, or masses Cardiac: RRR; no murmurs, rubs, or gallops,no edema  Respiratory:  clear to auscultation bilaterally, normal work of breathing GI: soft, nontender, nondistended, + BS MS: no deformity or atrophy  Skin: warm and dry, no rash Neuro:  Strength and sensation are intact Psych: euthymic mood, full affect Vascular: Femoral pulses +2 bilaterally.  No   EKG:  EKG is ordered today. EKG showed normal sinus rhythm with poor R wave progression in the anterior leads.  Recent Labs: 02/02/2021: TSH 0.960 05/06/2021: ALT 8 09/07/2021: BUN 14; Creatinine, Ser 0.96; Hemoglobin 11.6; Platelets 378; Potassium 5.2; Sodium 139    Lipid Panel    Component Value Date/Time   CHOL 356 (H) 12/10/2020 1007   TRIG 213 (H) 12/10/2020 1007   HDL 71 12/10/2020 1007   CHOLHDL 5.0 (H) 12/10/2020 1007   CHOLHDL 4.6 11/08/2019 0404   VLDL 21 11/08/2019 0404   LDLCALC 243 (H) 12/10/2020 1007      Wt Readings from Last 3 Encounters:  01/18/22 129 lb (58.5 kg)  10/19/21 129 lb 12.8 oz (58.9 kg)  09/15/21 124 lb (56.2 kg)       No flowsheet data found.    ASSESSMENT AND PLAN:  1.  Peripheral  arterial disease: Status post bilateral SFA intervention most recently left SFA orbital atherectomy and drug-coated balloon angioplasty.  Recommend continuing medical therapy.  Repeat Doppler studies in April.  Continue clopidogrel without aspirin given prior GI bleed.  2.  Coronary artery disease involving native coronary arteries without angina forms of angina: Status post CABG in 2017 .   unstable angina with severe stenosis and SVG to right PDA which was not amenable to PCI.  The other grafts were patent.  Her symptoms are well controlled with Imdur and metoprolol.  3. Familial hyperlipidemia with severely elevated LDL: Intolerance to statins.  She did extremely well in the past with Repatha but was not covered at some point.  Most recent LDL was 243.  I requested follow-up lipid profile and we will see if we can get her back on Repatha.    4.  Essential hypertension: Blood pressure is controlled.    Disposition:   FU with me in 6 months.  Signed,  [name], MD  01/18/2022 9:53 AM    [place]

## 2022-01-18 NOTE — Patient Instructions (Addendum)
Medication Instructions:  Dr. Fletcher Anon recommends Repatha (PCSK9). This is an injectable cholesterol medication. This medication will need prior approval with your insurance company, which we will work on. If the medication is not approved initially, we may need to do an appeal with your insurance. We will keep you updated on this process. This medication can be provided at some local pharmacies or be shipped to you from a specialty pharmacy.   *If you need a refill on your cardiac medications before your next appointment, please call your pharmacy*   Lab Work: Your provider would like for you to have the following labs today: Lipid and Liver  If you have labs (blood work) drawn today and your tests are completely normal, you will receive your results only by: [place] (if you have MyChart) OR A paper copy in the mail If you have any lab test that is abnormal or we need to change your treatment, we will call you to review the results.   Testing/Procedures: None ordered   Follow-Up: At [facility], you and your health needs are our priority.  As part of our continuing mission to provide you with exceptional heart care, we have created designated Provider Care Teams.  These Care Teams include your primary Cardiologist (physician) and Advanced Practice Providers (APPs -  Physician Assistants and Nurse Practitioners) who all work together to provide you with the care you need, when you need it.  We recommend signing up for the patient portal called "MyChart".  Sign up information is provided on this After Visit Summary.  MyChart is used to connect with patients for Virtual Visits (Telemedicine).  Patients are able to view lab/test results, encounter notes, upcoming appointments, etc.  Non-urgent messages can be sent to your provider as well.   To learn more about what you can do with MyChart, go to [email or web address].    Your next appointment:   6 month(s)  The format for your  next appointment:   In Person  Provider:   Dr. Fletcher Anon  Dr. Fletcher Anon would like for you to follow up with our pharmacists to discuss starting [place].

## 2022-01-25 ENCOUNTER — Telehealth: Payer: Self-pay | Admitting: Cardiovascular Disease

## 2022-01-25 NOTE — Telephone Encounter (Signed)
Patient returned call. Transferred to RN.  ?

## 2022-01-27 ENCOUNTER — Other Ambulatory Visit: Payer: Self-pay | Admitting: Physician Assistant

## 2022-02-08 ENCOUNTER — Other Ambulatory Visit: Payer: Self-pay

## 2022-02-08 ENCOUNTER — Ambulatory Visit: Payer: Medicare Other | Admitting: Pharmacist

## 2022-02-08 VITALS — BP 126/72 | HR 60 | Resp 16 | Ht <= 58 in | Wt 130.8 lb

## 2022-02-08 DIAGNOSIS — T466X5A Adverse effect of antihyperlipidemic and antiarteriosclerotic drugs, initial encounter: Secondary | ICD-10-CM

## 2022-02-08 DIAGNOSIS — I25119 Atherosclerotic heart disease of native coronary artery with unspecified angina pectoris: Secondary | ICD-10-CM

## 2022-02-08 DIAGNOSIS — T466X5D Adverse effect of antihyperlipidemic and antiarteriosclerotic drugs, subsequent encounter: Secondary | ICD-10-CM

## 2022-02-08 DIAGNOSIS — E7801 Familial hypercholesterolemia: Secondary | ICD-10-CM

## 2022-02-08 DIAGNOSIS — M791 Myalgia, unspecified site: Secondary | ICD-10-CM

## 2022-02-08 DIAGNOSIS — E7800 Pure hypercholesterolemia, unspecified: Secondary | ICD-10-CM

## 2022-02-08 DIAGNOSIS — I739 Peripheral vascular disease, unspecified: Secondary | ICD-10-CM

## 2022-02-08 MED ORDER — REPATHA PUSHTRONEX SYSTEM 420 MG/3.5ML ~~LOC~~ SOCT: 3.6000 mL | SUBCUTANEOUS | 11 refills | Status: DC

## 2022-02-08 NOTE — Patient Instructions (Signed)
It was good seeing you today ? ?We have submitted the prior authorization for your Repatha and you are qualified for grant assistance ? ?I have sent over Repatha '420mg'$  to [facility] in [place] [facility].  You will use one infuser, every 30 days ? ?We will recheck your cholesterol in 2-3 months ? ?Please call with any questions ? ?Karren Cobble, PharmD, BCACP, [place], CPP ?[address], [address] ?[place], [place], [address] ?Phone: [phone], Fax: [phone]  ?

## 2022-02-08 NOTE — Progress Notes (Signed)
Patient ID: Katherine Hall                 DOB: 14-Jun-1941                    MRN: [id number] ? ? ? ? ?HPI: ?Katherine Hall is a 81 y.o. female patient referred to lipid clinic by Dr Fletcher Anon. 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   ? ?Patient presents today in good spirits. Expresses she would like to restart Repatha.  Had taken in the past which had brought LDL down, however had not reached goal. ? ?Patient has significant family history of CAD. Has been seen at Dr. Rod Mae lipid clinic and multiple lipid lowering agents had been discussed including PCSK9i, bempedoic acid, and Leqvio. Patient's insurance plan stopped covering Repatha so she was forced to discontinue. She has been off cholesterol medications for nearly a year and LDL has since increased. ? ?Patient is intolerant to all statins she has tried. All caused severe myalgias in which she had trouble moving.  Has tried rosuvastatin and atorvastatin as well as others she can not recall.   ? ?Current Medications: N/a ? ?Intolerances:  ?Atorvastatin ?Rosuvastatin ?Other statins ? ?Risk Factors:  ?CAD ?HLD ?PAD ?Hx of CABG ?Family History ?Familial hyperlipidemia ? ?Labs: TC 449, Trigs 243, HDL 59, LDL 334 (01/18/22) ? ?Past Medical History:  ?Diagnosis Date  ? Anemia   ? Arthritis   ? Coronary artery disease   ? s/p CABG in 2017 // Myoview 11/21: No ischemia or infarction, EF > 65, low risk  // [place] >> cath 3/22: S-RPDA w severe disease - PCI unsuccessful & c/b peri-procedure MI (acute closure w wire manipulation>>inf ST elev) >> Med Rx   ? Echocardiogram 02/2021   ? EF 65-70, no RWMA, mild LVH, Gr 1 DD, normal RVSF, RVSP 19.5, mild AI, mild AV sclerosis  ? Familial hypercholesterolemia   ? a. h/o intolerance of statins, prev on Repatha but insurance stopped covering.  ? Gastric ulcer   ? a. h/o bleeding ulcer 2009 requiring 3 pints of blood.  ? History of removal of cyst    ? a. 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  ? HTN (hypertension)   ? Hypothyroidism   ? PAD (peripheral artery disease) ([place])   ? S/p L SFA PTA // ABIs/LE arterial US 4/22: R 0.7; L 0.86 // R SFA 75-99; L SFA angioplasty site 30-49, distal 50-74 // ectatic distal aorta 2.6 x 2.6 cm  ? Prediabetes   ? ? ?Current Outpatient Medications on File Prior to Visit  ?Medication Sig Dispense Refill  ? triamcinolone acetonide (KENALOG-40) 40 MG/ML injection (RADIOLOGY ONLY) Inject into the articular space.    ? acetaminophen (TYLENOL) 650 MG CR tablet Take 1,300 mg by mouth in the morning and at bedtime.    ? acyclovir (ZOVIRAX) 400 MG tablet Take 800 mg by mouth daily as needed (fever blisters).     ? alendronate (FOSAMAX) 70 MG tablet Take 70 mg by mouth every Friday.    ? amLODipine (NORVASC) 5 MG tablet Take by mouth.    ? benzonatate (TESSALON) 200 MG capsule Take 200 mg by mouth 3 (three) times daily as needed for cough.    ? Carboxymethylcellul-Glycerin (LUBRICATING EYE DROPS OP) Place 1 drop into both eyes daily as needed (dry eyes).    ? clopidogrel (PLAVIX) 75  MG tablet Take 1 tablet (75 mg total) by mouth daily. Resume only after 5 days ( on 05/13/21) 90 tablet 3  ? diphenhydrAMINE (BENADRYL) 25 MG tablet Take 25 mg by mouth daily as needed for allergies.    ? diphenhydramine-acetaminophen (TYLENOL PM) 25-500 MG TABS tablet Take 2 tablets by mouth at bedtime.    ? estradiol (ESTRACE) 0.5 MG tablet Take by mouth.    ? fluticasone (FLONASE) 50 MCG/ACT nasal spray Place 1 spray into both nostrils 2 (two) times daily.    ? hydrochlorothiazide (HYDRODIURIL) 25 MG tablet TAKE 1 TABLET BY MOUTH  DAILY 90 tablet 3  ? HYDROcodone-acetaminophen (NORCO/VICODIN) 5-325 MG tablet Take by mouth.    ? isosorbide mononitrate (IMDUR) 30 MG 24 hr tablet TAKE 1 TABLET(30 MG) BY MOUTH DAILY 90 tablet 3  ? levothyroxine (SYNTHROID, LEVOTHROID) 50 MCG tablet Take 50 mcg by mouth daily before breakfast.    ?  lisinopril (ZESTRIL) 20 MG tablet Take 20 mg by mouth 2 (two) times daily.    ? metoprolol tartrate (LOPRESSOR) 25 MG tablet TAKE 1/2 TABLET(12.5 MG) BY MOUTH TWICE DAILY 45 tablet 3  ? nitroGLYCERIN (NITROSTAT) 0.4 MG SL tablet Place 1 tablet (0.4 mg total) under the tongue every 5 (five) minutes x 3 doses as needed for chest pain. 25 tablet 1  ? pantoprazole (PROTONIX) 40 MG tablet Take 40 mg by mouth 2 (two) times daily.    ? potassium chloride (KLOR-CON) 10 MEQ tablet Take 10 mEq by mouth daily.    ? rOPINIRole (REQUIP) 2 MG tablet Take 2 mg by mouth at bedtime.    ? traMADol (ULTRAM) 50 MG tablet Take 1 tablet (50 mg total) by mouth every 6 (six) hours as needed for severe pain. (Patient taking differently: Take 50 mg by mouth daily.) 28 tablet 0  ? ?No current facility-administered medications on file prior to visit.  ? ? ?Allergies  ?Allergen Reactions  ? Penicillins Anaphylaxis  ?  Did it involve swelling of the face/tongue/throat, SOB, or low BP? Yes ?Did it involve sudden or severe rash/hives, skin peeling, or any reaction on the inside of your mouth or nose? No ?Did you need to seek medical attention at a hospital or doctor's office? Yes ?When did it last happen?      10+ years ?If all above answers are ?NO?, may proceed with cephalosporin use. ? ?  ? Lincomycin Swelling and Other (See Comments)  ?  passed out  ? Naproxen Sodium Other (See Comments)  ?  Stomach ulcers  ? Statins Other (See Comments)  ?  Myalgias  ? Latex Other (See Comments)  ?  Blisters when in contact with skin  ? Tape   ?  blistering  ? ? ?Assessment/Plan: ? ?1. Hyperlipidemia - Patient recent LDL 334 which suggests familial hypercholesterolemia.  Lowest LDL in past years has been 244. Intolerant to all statins and was not able to reach goal on PCSK9i, however it did lower her LDL levels and patient would like to restart now that she has insurance coverage again. ? ?Patient has tried both the Repatha pens and once monthly infuser.   Would like to start once monthly infuser again. Applied for grant assistance and was approved. PA approved while patient was in room.  Patient did not require training on how to use device. Will send to pharmacy and have patient recheck lipid panel in 2-3 months. ? ?Start Repatha '420mg'$  sq q 30 days ?Recheck lipid panel in 2-3  months ? ?Karren Cobble, PharmD, BCACP, [place], CPP ?[address], [address] ?[place], [place], [address] ?Phone: [phone], Fax: [phone]  ?  ?

## 2022-02-16 NOTE — Telephone Encounter (Signed)
Per review of chart, patient started on Repatha Pushtronex by C. Pavero RPH ?

## 2022-02-21 ENCOUNTER — Other Ambulatory Visit: Payer: Self-pay | Admitting: Cardiovascular Disease

## 2022-02-23 ENCOUNTER — Telehealth: Payer: Self-pay | Admitting: Cardiovascular Disease

## 2022-02-23 DIAGNOSIS — I739 Peripheral vascular disease, unspecified: Secondary | ICD-10-CM

## 2022-02-23 DIAGNOSIS — E7801 Familial hypercholesterolemia: Secondary | ICD-10-CM

## 2022-02-23 DIAGNOSIS — I25119 Atherosclerotic heart disease of native coronary artery with unspecified angina pectoris: Secondary | ICD-10-CM

## 2022-02-23 DIAGNOSIS — E7800 Pure hypercholesterolemia, unspecified: Secondary | ICD-10-CM

## 2022-02-23 DIAGNOSIS — M791 Myalgia, unspecified site: Secondary | ICD-10-CM

## 2022-02-23 NOTE — Telephone Encounter (Signed)
Lmom the pt that we would be happy to send rx for repatha and that the script looked correct from what I could tell and to please give [place] a call so we can send rx to a pharmacy that does carry it ? ?

## 2022-02-23 NOTE — Telephone Encounter (Signed)
Pt c/o medication issue: ? ?1. Name of Medication: Evolocumab with Infusor ([place]) 420 MG/3.5ML SOCT ? ?2. How are you currently taking this medication (dosage and times per day)? Patient has not started yet ? ?3. Are you having a reaction (difficulty breathing--STAT)? no ? ?4. What is your medication issue? Patient said that the local [facility] she uses does not carry this medication and the rx will need to be sent to another pharmacy. ? ?She was also told by the pharmacy that even if they did have the mediation, the rx was written with an incorrect dosage and they could not dispense it. ? ?The patient would just like to get this resolved so that she can start using the medication  ?

## 2022-02-23 NOTE — Telephone Encounter (Signed)
Can you look into this?

## 2022-02-28 ENCOUNTER — Other Ambulatory Visit: Payer: Self-pay

## 2022-02-28 DIAGNOSIS — E7800 Pure hypercholesterolemia, unspecified: Secondary | ICD-10-CM

## 2022-02-28 DIAGNOSIS — I25119 Atherosclerotic heart disease of native coronary artery with unspecified angina pectoris: Secondary | ICD-10-CM

## 2022-02-28 DIAGNOSIS — T466X5A Adverse effect of antihyperlipidemic and antiarteriosclerotic drugs, initial encounter: Secondary | ICD-10-CM

## 2022-02-28 DIAGNOSIS — I739 Peripheral vascular disease, unspecified: Secondary | ICD-10-CM

## 2022-02-28 DIAGNOSIS — E7801 Familial hypercholesterolemia: Secondary | ICD-10-CM

## 2022-02-28 MED ORDER — REPATHA PUSHTRONEX SYSTEM 420 MG/3.5ML ~~LOC~~ SOCT: 3.6000 mL | SUBCUTANEOUS | 11 refills | Status: DC

## 2022-03-31 ENCOUNTER — Other Ambulatory Visit: Payer: Self-pay | Admitting: Cardiovascular Disease

## 2022-04-04 ENCOUNTER — Encounter: Payer: Self-pay | Admitting: Cardiovascular Disease

## 2022-04-04 ENCOUNTER — Ambulatory Visit: Payer: Medicare Other | Admitting: Cardiovascular Disease

## 2022-04-04 VITALS — BP 108/62 | HR 58 | Ht <= 58 in | Wt 124.4 lb

## 2022-04-04 DIAGNOSIS — I25119 Atherosclerotic heart disease of native coronary artery with unspecified angina pectoris: Secondary | ICD-10-CM

## 2022-04-04 DIAGNOSIS — I10 Essential (primary) hypertension: Secondary | ICD-10-CM

## 2022-04-04 MED ORDER — METOPROLOL TARTRATE 25 MG PO TABS: ORAL_TABLET | ORAL | 3 refills | Status: DC

## 2022-04-04 NOTE — Progress Notes (Signed)
? ?Cardiology Office Note ? ? ?Date:  04/04/2022  ? ?ID:  [name], DOB 1941/11/16, MRN [id number] ? ?PCP:  Stacie Glaze, DO  ?Cardiologist:   Mertie Moores, MD , [place]  ? ?Chief Complaint  ?Patient presents with  ? Coronary Artery Disease  ?   ?  ? Hyperlipidemia  ? ?Problem list ?1. Coronary artery disease-status post coronary artery bypass grafting-  CORONARY ARTERY BYPASS GRAFTING x 5 ?-LIMA to LAD ?-SVG to DIAGONAL ?-SVG to PDA ?-SEQ SVG OM1 and OM2 ? ?2. Hypertension ?3. Hyperlipidemia ?4. Anemia ?  ?  ?Katherine Hall is a 81 y.o. female who presents for follow-up of her coronary artery disease. She had coronary artery bypass grafting in August. 2017  ? She has had a moderate left pleural effusion ?Still gradually improving . ?Needs an order to do cardiac rehab.  Wants to do [facility] strides at [facility]  ? ?Breathing is better.  ?Still has some chest soreness,  The angina is better  ? ?Has very high lipids.   Cannot tolerate statins.  ?Has seen Megan Supple to initiate Repatha - working on that  ?Still sleeping in a recliner ? ?Jan. 8, 2018: ? ?[name] is doing well. She had coronary artery bypass grafting in August, 2017: ?Is not doing cardiac rehab.  Has lots of leg pain  ?Needs to have surgery on her right knee ?Has SVG harvest site pain .  ?Does not take her BP regularly  ?Aug. 22, 2018: ? ?[name] is still having some chest pains. ?Described as a discomfort.  Last 5 minutes. ?She does not know if it worsens with twisting or taking a deep breath  ?Is not exercising ,.  ? ?May 01, 2018: ?Seen for follow-up of her coronary artery disease.  She is status post coronary artery bypass grafting in August, 2017.  Also has a history of hypertension and hyperlipidemia. ?No cp.   ?Not exercising much  ?Has PVD,   Had a coated stent placed.  Complicated by a significant groin bleed ?Required transfusion of 2 u PRBS  ? ?Some various aches and pains .  Has some left neck pain .   Lasts for a few seconds.  ?Has  had right  knee surgery since I last saw her.   ?No CP or dyspnea , ?Gets fatigued easily . ?Does not sleep well  ? ?Dec. 2, 2019: ? ?Not exercising  ?Leg pain  ?No CP  ?Has arthrisit and PAD  ?Her Lipids are very high. ?Did well with Repatha but she couldn't afford it  ?Is in the Clear trial  ?But couldn't tolerate the Bempidoic acid  ? ? ?Aug. 25, 2020 ? ?Doing well ?Has some pain in her left leg.  ?Pain increases with waking  ?Concerned with PAD  ?Is in a cholesterol study so we do not have chol levels since 2018.  ? ?March 12, 2020:  ?Katherine Hall  was seen today for a follow-up of her hyperlipidemia, coronary artery disease, leg pain. ?Lipids are still elevated.  ? ? ?Nov. 8, 2021: ?Katherine Hall was seen today for follow up of her HLD, CAD, PAD  ?Has been referred to Lipid clinic and has seen Dr. Debara Pickett. ?BP has been elevated.   BP readings at home have been elevated. ?I reviewed her BP log.    ? ?Went to the ER with CP , left shoulder pain . ?BP was elevated ? ?She is on hydrochlorothiazide 12.5 mg a day and lisinopril 20 mg twice a  day. ? ?I reviewed her diet.   Does not eat much salt.  . ? ?Lipids have continued to be elevated.   She is seeing Dr. Debara Pickett and is working on getting into a study for a higher dose Repatha once a month.  ? ? ?March 15, 2021: ?Katherine Hall is seen today for follow up of her CAD/ CABG, HLD, PAD  ?Cath on February 03, 2021: ?Severe native CAD ?LIMA - LAD patent ?SVG- OM1/OM3 - patent ?SVG -D1 - 60% stenosis ?SVG - PDA - sequential 90%, 95%, 90%  stenosis ?PCI attempt was unsuccessful.   Resulted in closue of the SVG and a small inf. MI  ?She was treated medically and did well .  Has had to take several NTG since that time .  ? ?She has claudication and will follow up with Dr. Fletcher Anon ?Still having lots of claudication.   She is scheduled  ?She sees Dr. Debara Pickett for HLD.   Does not tolerate statins and zetia  ?Did not get a desired response from [place]. ?Considered Inclisiran - she wants to take this  ?Will  reach out to Dr. Debara Pickett  ?  ?Apr 04, 2022:  ?Katherine Hall is seen today for follow-up of her coronary artery disease, peripheral arterial disease and hyperlipidemia. ?Felt an unusual sensation in her right leg several weeks ago .  ?Has has some chest twinges, and chest heaviness.  ? ?Also has some episodes of shortness of breath  ? Seems to have had a reaction to Repatha - nausea , vomitting  ? ? ? ?Past Medical History:  ?Diagnosis Date  ? Anemia   ? Arthritis   ? Coronary artery disease   ? s/p CABG in 2017 // Myoview 11/21: No ischemia or infarction, EF > 65, low risk  // [place] >> cath 3/22: S-RPDA w severe disease - PCI unsuccessful & c/b peri-procedure MI (acute closure w wire manipulation>>inf ST elev) >> Med Rx   ? Echocardiogram 02/2021   ? EF 65-70, no RWMA, mild LVH, Gr 1 DD, normal RVSF, RVSP 19.5, mild AI, mild AV sclerosis  ? Familial hypercholesterolemia   ? a. h/o intolerance of statins, prev on Repatha but insurance stopped covering.  ? Gastric ulcer   ? a. h/o bleeding ulcer 2009 requiring 3 pints of blood.  ? History of removal of cyst   ? a. 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  ? HTN (hypertension)   ? Hypothyroidism   ? PAD (peripheral artery disease) ([place])   ? S/p L SFA PTA // ABIs/LE arterial US 4/22: R 0.7; L 0.86 // R SFA 75-99; L SFA angioplasty site 30-49, distal 50-74 // ectatic distal aorta 2.6 x 2.6 cm  ? Prediabetes   ? ? ?Past Surgical History:  ?Procedure Laterality Date  ? ABDOMINAL AORTOGRAM W/LOWER EXTREMITY Bilateral 11/06/2019  ? Procedure: ABDOMINAL AORTOGRAM W/LOWER EXTREMITY;  Surgeon: Wellington Hampshire, MD;  Location: [facility];  Service: Cardiovascular;  Laterality: Bilateral;  ? ABDOMINAL AORTOGRAM W/LOWER EXTREMITY N/A 03/31/2021  ? Procedure: ABDOMINAL AORTOGRAM W/LOWER EXTREMITY;  Surgeon: Wellington Hampshire, MD;  Location: [facility];  Service: Cardiovascular;  Laterality: N/A;  ? ABDOMINAL AORTOGRAM W/LOWER EXTREMITY N/A 09/15/2021  ?  Procedure: ABDOMINAL AORTOGRAM W/LOWER EXTREMITY;  Surgeon: Wellington Hampshire, MD;  Location: [facility];  Service: Cardiovascular;  Laterality: N/A;  ? ABDOMINAL HYSTERECTOMY    ? APPENDECTOMY    ? CARDIAC CATHETERIZATION N/A 07/05/2016  ?  Procedure: Left Heart Cath and Coronary Angiography;  Surgeon: Burnell Blanks, MD;  Location: [facility];  Service: Cardiovascular;  Laterality: N/A;  ? CARDIAC SURGERY    ? cyst, not on heart.  ? CORONARY ARTERY BYPASS GRAFT N/A 07/07/2016  ? Procedure: CORONARY ARTERY BYPASS GRAFTING (CABG) x 5 with endoscopic harvesting of the right greater saphenous vein;  Surgeon: Gaye Pollack, MD;  Location: [facility];  Service: Open Heart Surgery;  Laterality: N/A;  ? CORONARY BALLOON ANGIOPLASTY N/A 02/03/2021  ? Procedure: CORONARY BALLOON ANGIOPLASTY;  Surgeon: Wellington Hampshire, MD;  Location: [facility];  Service: Cardiovascular;  Laterality: N/A;  svg to pda  ? ESOPHAGOGASTRODUODENOSCOPY (EGD) WITH PROPOFOL N/A 05/08/2021  ? Procedure: ESOPHAGOGASTRODUODENOSCOPY (EGD) WITH PROPOFOL;  Surgeon: Arta Silence, MD;  Location: [facility];  Service: Endoscopy;  Laterality: N/A;  ? HEMORRHOID SURGERY    ? INTRAOPERATIVE TRANSESOPHAGEAL ECHOCARDIOGRAM N/A 07/07/2016  ? Procedure: INTRAOPERATIVE TRANSESOPHAGEAL ECHOCARDIOGRAM;  Surgeon: Gaye Pollack, MD;  Location: [facility];  Service: Open Heart Surgery;  Laterality: N/A;  ? LEFT HEART CATH AND CORS/GRAFTS ANGIOGRAPHY N/A 02/03/2021  ? Procedure: LEFT HEART CATH AND CORS/GRAFTS ANGIOGRAPHY;  Surgeon: Wellington Hampshire, MD;  Location: [facility];  Service: Cardiovascular;  Laterality: N/A;  ? PERIPHERAL VASCULAR ATHERECTOMY Left 09/15/2021  ? Procedure: PERIPHERAL VASCULAR ATHERECTOMY;  Surgeon: Wellington Hampshire, MD;  Location: [facility];  Service: Cardiovascular;  Laterality: Left;  ? PERIPHERAL VASCULAR BALLOON ANGIOPLASTY  11/06/2019  ? Procedure: PERIPHERAL VASCULAR BALLOON ANGIOPLASTY;  Surgeon:  Wellington Hampshire, MD;  Location: [facility];  Service: Cardiovascular;;  cutting and dcb  ? PERIPHERAL VASCULAR BALLOON ANGIOPLASTY Right 03/31/2021  ? Procedure: PERIPHERAL VASCULAR BALLOON ANGIOPLA

## 2022-04-04 NOTE — Patient Instructions (Signed)
Medication Instructions:  ?Your physician recommends that you continue on your current medications as directed. Please refer to the Current Medication list given to you today. ? ?*If you need a refill on your cardiac medications before your next appointment, please call your pharmacy* ? ?Lab Work: ?None ?If you have labs (blood work) drawn today and your tests are completely normal, you will receive your results only by: ?MyChart Message (if you have MyChart) OR ?A paper copy in the mail ?If you have any lab test that is abnormal or we need to change your treatment, we will call you to review the results. ? ?Testing/Procedures: ?Cardiac PET Scan ? ?Follow-Up: ?At [facility], you and your health needs are our priority.  As part of our continuing mission to provide you with exceptional heart care, we have created designated Provider Care Teams.  These Care Teams include your primary Cardiologist (physician) and Advanced Practice Providers (APPs -  Physician Assistants and Nurse Practitioners) who all work together to provide you with the care you need, when you need it. ? ?Your next appointment:   ?1 year(s) ? ?The format for your next appointment:   ?In Person ? ?Provider:   ?Mertie Moores, MD   ? ? ?Other Instructions ?See Instructions below for Cardiac PET Scan ? ? ? ?How to Prepare for Your Cardiac PET/CT Stress Test: ? ?1. Please do not take these medications before your test:  ? ?Isosorbide, Metoprolol and Nitroglycerin ?Your remaining medications may be taken with water. ? ?2. Nothing to eat or drink, except water, 3 hours prior to arrival time.   ?NO caffeine/decaffeinated products, or chocolate 12 hours prior to arrival. ? ?3. NO perfume, cologne or lotion ? ?4. Total time is 1 to 2 hours; you may want to bring reading material for the waiting time. ? ?5. Please report to Admitting at the [facility] Entrance 60 minutes early for your test. ? [address] ? [place], [place]  [address] ? ? ?In preparation for your appointment, medication and supplies will be purchased.  Appointment availability is limited, so if you need to cancel or reschedule, please call the Radiology Department at [phone]  24 hours in advance to avoid a cancellation fee of $100.00 ? ?What to Expect After you Arrive: ? ?Once you arrive and check in for your appointment, you will be taken to a preparation room within the Radiology Department.  A technologist or Nurse will obtain your medical history, verify that you are correctly prepped for the exam, and explain the procedure.  Afterwards,  an IV will be started in your arm and electrodes will be placed on your skin for EKG monitoring during the stress portion of the exam. Then you will be escorted to the PET/CT scanner.  There, staff will get you positioned on the scanner and obtain a blood pressure and EKG.  During the exam, you will continue to be connected to the EKG and blood pressure machines.  A small, safe amount of a radioactive tracer will be injected in your IV to obtain a series of pictures of your heart along with an injection of a stress agent.   ? ?After your Exam: ? ?It is recommended that you eat a meal and drink a caffeinated beverage to counter act any effects of the stress agent.  Drink plenty of fluids for the remainder of the day and urinate frequently for the first couple of hours after the exam.  Your doctor will inform you  of your test results within 7-10 business days. ? ?For questions about your test or how to prepare for your test, please call: ?Marchia Bond, Cardiac Imaging Nurse Navigator  ?Gordy Clement, Cardiac Imaging Nurse Navigator ?Office: [phone]  ?

## 2022-04-11 ENCOUNTER — Other Ambulatory Visit: Payer: Self-pay

## 2022-04-11 MED ORDER — METOPROLOL TARTRATE 25 MG PO TABS: ORAL_TABLET | ORAL | 3 refills | Status: DC

## 2022-04-12 ENCOUNTER — Telehealth: Payer: Self-pay

## 2022-04-12 NOTE — Telephone Encounter (Signed)
Lmom pt to see if they were still taking the repatha and if so we need up to date lipid panel  ?

## 2022-04-21 ENCOUNTER — Ambulatory Visit (HOSPITAL_COMMUNITY)
Admission: RE | Admit: 2022-04-21 | Discharge: 2022-04-21 | Disposition: A | Discharge status: Home / Self Care | Payer: Medicare Other | Source: Ambulatory Visit | Attending: Internal Medicine | Admitting: Internal Medicine

## 2022-04-21 ENCOUNTER — Other Ambulatory Visit: Payer: Self-pay

## 2022-04-21 DIAGNOSIS — E7800 Pure hypercholesterolemia, unspecified: Secondary | ICD-10-CM

## 2022-04-21 DIAGNOSIS — T466X5A Adverse effect of antihyperlipidemic and antiarteriosclerotic drugs, initial encounter: Secondary | ICD-10-CM

## 2022-04-21 DIAGNOSIS — I739 Peripheral vascular disease, unspecified: Secondary | ICD-10-CM

## 2022-04-21 DIAGNOSIS — Z9862 Peripheral vascular angioplasty status: Secondary | ICD-10-CM | POA: Diagnosis not present

## 2022-04-21 DIAGNOSIS — E7801 Familial hypercholesterolemia: Secondary | ICD-10-CM

## 2022-04-21 DIAGNOSIS — I25119 Atherosclerotic heart disease of native coronary artery with unspecified angina pectoris: Secondary | ICD-10-CM

## 2022-04-21 LAB — LIPID PANEL
Chol/HDL Ratio: 5.2 ratio — ABNORMAL HIGH (ref 0.0–4.4)
Cholesterol, Total: 285 mg/dL — ABNORMAL HIGH (ref 100–199)
HDL: 55 mg/dL (ref >39)
LDL Chol Calc (NIH): 184 mg/dL — ABNORMAL HIGH (ref 0–99)
Triglycerides: 239 mg/dL — ABNORMAL HIGH (ref 0–149)
VLDL Cholesterol Cal: 46 mg/dL — ABNORMAL HIGH (ref 5–40)

## 2022-04-25 ENCOUNTER — Other Ambulatory Visit: Payer: Self-pay | Admitting: *Deleted

## 2022-04-25 DIAGNOSIS — I739 Peripheral vascular disease, unspecified: Secondary | ICD-10-CM

## 2022-05-17 ENCOUNTER — Telehealth: Payer: Self-pay

## 2022-05-17 NOTE — Telephone Encounter (Signed)
Dr. Acie Fredrickson, request for patient to hold Plavix for 5 days prior to oral surgery. Most recent cardiac cath report from 02/2021. Please direct your response to p cv div preop.  1.  Severe underlying three-vessel coronary artery disease with patent grafts including LIMA to LAD, SVG to OM1/OM 3, SVG to diagonal with 60% anastomosis stenosis and SVG to right PDA.  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 2.  Left ventricular angiography was not performed.  EF was normal by echo.  Normal LVEDP. 3.  Unsuccessful attempted angioplasty to SVG to right PDA due to inability to cross the stenosis beyond the aneurysmal segment.  There was loss of flow and acute closure with wire manipulation.  Multiple CTO wires were used but with no success.  The patient developed mild inferior ST elevation on the monitor with throat discomfort.  Symptoms improved with nitroglycerin drip.   Recommendations: The patient will have an inferior infarct although the right PDA distribution is relatively small.  The native RCA is diffusely diseased and tortuous and likely not suitable for PCI. I elected to place the patient on nitroglycerin drip and will continue medical therapy for her coronary artery disease.   The procedure was performed via the right common femoral artery after unsuccessful access via the left radial artery due to suspected radial loop.

## 2022-05-17 NOTE — Telephone Encounter (Signed)
   Pre-operative Risk Assessment    Patient Name: Katherine Hall  DOB: [birth year] MRN: [id number]     Request for Surgical Clearance    Procedure:  Dental Extraction - Amount of Teeth to be Pulled:  5 teeth Removal of remaining upper teeth with alveoplasty and growth factor due to fosamax use.  Date of Surgery:  Clearance TBD                                 Surgeon:  Larrie Kass, MD Surgeon's Group or Practice Name:  [facility] Phone number:  [phone] Fax number:  [phone] [place]   Type of Clearance Requested:   - Medical  - Pharmacy:  Hold Clopidogrel (Plavix) Can patient hold their Plavix five (5) days prior to surgery?   Type of Anesthesia:   Intravenous Sedation containing Epinephrine   Additional requests/questions:    Signed, Aesha Agrawal   05/17/2022, 8:44 AM

## 2022-05-17 NOTE — Telephone Encounter (Signed)
Pt is at low risk for procedure.  She may hold her Plavix for 5 days prior to procedure        Katherine Moores, MD   05/17/2022 1:00 PM     [address]  [address]  [place], [place]  [address]  Phone: [phone]; Fax: [phone]

## 2022-07-04 ENCOUNTER — Telehealth (HOSPITAL_COMMUNITY): Payer: Self-pay | Admitting: *Deleted

## 2022-07-04 NOTE — Telephone Encounter (Signed)
Attempted to call patient regarding upcoming cardiac PET appointment. Left message on voicemail with name and callback number  Gordy Clement RN Navigator Cardiac Imaging [facility] [phone] Office [phone] Cell  Reminder left for patient to avoid caffeine 12 hours prior to cardiac PET scan.

## 2022-07-05 ENCOUNTER — Ambulatory Visit (HOSPITAL_COMMUNITY)
Admission: RE | Admit: 2022-07-05 | Discharge: 2022-07-05 | Disposition: A | Discharge status: Home / Self Care | Payer: Medicare Other | Source: Ambulatory Visit | Attending: Cardiovascular Disease | Admitting: Cardiovascular Disease

## 2022-07-05 DIAGNOSIS — I10 Essential (primary) hypertension: Secondary | ICD-10-CM | POA: Insufficient documentation

## 2022-07-05 DIAGNOSIS — I25119 Atherosclerotic heart disease of native coronary artery with unspecified angina pectoris: Secondary | ICD-10-CM | POA: Diagnosis not present

## 2022-07-05 MED ORDER — REGADENOSON 0.4 MG/5ML IV SOLN
INTRAVENOUS | Status: AC
  Administered 2022-07-05: 0.4 mg via INTRAVENOUS
  Filled 2022-07-05: qty 5

## 2022-07-05 MED ORDER — RUBIDIUM RB82 GENERATOR (RUBYFILL)
14.6000 | PACK | Freq: Once | INTRAVENOUS | Status: AC
  Administered 2022-07-05: 14.7 via INTRAVENOUS

## 2022-07-05 MED ORDER — RUBIDIUM RB82 GENERATOR (RUBYFILL)
14.6000 | PACK | Freq: Once | INTRAVENOUS | Status: AC
  Administered 2022-07-05: 14.71 via INTRAVENOUS

## 2022-07-06 LAB — NM PET CT CARDIAC PERFUSION MULTI W/ABSOLUTE BLOODFLOW
MBFR: 1.61
Nuc Rest EF: 61 %
Nuc Stress EF: 66 %
Peak HR: 75 {beats}/min
Rest HR: 67 {beats}/min
Rest MBF: 1.16 ml/g/min
Rest Nuclear Isotope Dose: 14.7 mCi
Rest perfusion cavity size (mL): 57 mL
ST Depression (mm): 0 mm
Stress MBF: 1.87 ml/g/min
Stress Nuclear Isotope Dose: 14.7 mCi
Stress perfusion cavity size (mL): 64 mL
TID: 1.08

## 2022-07-07 ENCOUNTER — Telehealth: Payer: Self-pay | Admitting: Cardiovascular Disease

## 2022-07-07 MED ORDER — ISOSORBIDE MONONITRATE ER 60 MG PO TB24: 60.0000 mg | ORAL_TABLET | Freq: Every day | ORAL | 3 refills | Status: DC

## 2022-07-07 NOTE — Telephone Encounter (Signed)
Pt calling back to go over results

## 2022-07-07 NOTE — Telephone Encounter (Signed)
The patient has been notified of the result and verbalized understanding.  All questions (if any) were answered. Darrell Jewel, RN 07/07/2022 12:18 PM    Appt made with Dr. Acie Fredrickson. 8/14. Imdur increased.

## 2022-07-07 NOTE — Telephone Encounter (Signed)
-----   Message from Thayer Headings, MD sent at 07/07/2022  9:11 AM EDT ----- PET scan shows inferior ischemia.  Please her her increase her Imdur to 60 mg a day  Please have her see me or an app in the next several weeks. She will likely need to be set up for a cath. If the pain intensifies or does not go away with SL NTG, she should call EMS and go to the ER

## 2022-07-15 ENCOUNTER — Encounter (HOSPITAL_COMMUNITY): Payer: Self-pay

## 2022-07-15 ENCOUNTER — Observation Stay (HOSPITAL_BASED_OUTPATIENT_CLINIC_OR_DEPARTMENT_OTHER)
Admission: EM | Admit: 2022-07-15 | Discharge: 2022-07-19 | Disposition: A | Discharge status: Home / Self Care | Payer: Medicare Other | Attending: Internal Medicine | Admitting: Internal Medicine

## 2022-07-15 ENCOUNTER — Other Ambulatory Visit: Payer: Self-pay

## 2022-07-15 ENCOUNTER — Encounter (HOSPITAL_BASED_OUTPATIENT_CLINIC_OR_DEPARTMENT_OTHER): Payer: Self-pay

## 2022-07-15 ENCOUNTER — Emergency Department (HOSPITAL_BASED_OUTPATIENT_CLINIC_OR_DEPARTMENT_OTHER): Payer: Medicare Other

## 2022-07-15 DIAGNOSIS — Z91040 Latex allergy status: Secondary | ICD-10-CM | POA: Diagnosis not present

## 2022-07-15 DIAGNOSIS — R079 Chest pain, unspecified: Secondary | ICD-10-CM | POA: Diagnosis present

## 2022-07-15 DIAGNOSIS — I10 Essential (primary) hypertension: Secondary | ICD-10-CM | POA: Insufficient documentation

## 2022-07-15 DIAGNOSIS — Z7902 Long term (current) use of antithrombotics/antiplatelets: Secondary | ICD-10-CM | POA: Diagnosis not present

## 2022-07-15 DIAGNOSIS — Z20822 Contact with and (suspected) exposure to covid-19: Secondary | ICD-10-CM | POA: Insufficient documentation

## 2022-07-15 DIAGNOSIS — I739 Peripheral vascular disease, unspecified: Secondary | ICD-10-CM | POA: Diagnosis present

## 2022-07-15 DIAGNOSIS — I25110 Atherosclerotic heart disease of native coronary artery with unstable angina pectoris: Principal | ICD-10-CM | POA: Insufficient documentation

## 2022-07-15 DIAGNOSIS — E039 Hypothyroidism, unspecified: Secondary | ICD-10-CM | POA: Insufficient documentation

## 2022-07-15 DIAGNOSIS — E785 Hyperlipidemia, unspecified: Secondary | ICD-10-CM | POA: Diagnosis present

## 2022-07-15 DIAGNOSIS — N179 Acute kidney failure, unspecified: Secondary | ICD-10-CM | POA: Insufficient documentation

## 2022-07-15 DIAGNOSIS — I25119 Atherosclerotic heart disease of native coronary artery with unspecified angina pectoris: Secondary | ICD-10-CM | POA: Diagnosis present

## 2022-07-15 DIAGNOSIS — D649 Anemia, unspecified: Secondary | ICD-10-CM | POA: Diagnosis present

## 2022-07-15 DIAGNOSIS — Z79899 Other long term (current) drug therapy: Secondary | ICD-10-CM | POA: Diagnosis not present

## 2022-07-15 DIAGNOSIS — Z951 Presence of aortocoronary bypass graft: Secondary | ICD-10-CM

## 2022-07-15 DIAGNOSIS — I200 Unstable angina: Secondary | ICD-10-CM | POA: Diagnosis present

## 2022-07-15 DIAGNOSIS — E871 Hypo-osmolality and hyponatremia: Secondary | ICD-10-CM | POA: Insufficient documentation

## 2022-07-15 LAB — TROPONIN I (HIGH SENSITIVITY)
Troponin I (High Sensitivity): 14 ng/L (ref <18)
Troponin I (High Sensitivity): 11 ng/L (ref <18)

## 2022-07-15 LAB — COMPREHENSIVE METABOLIC PANEL
ALT: 13 U/L (ref 0–44)
AST: 17 U/L (ref 15–41)
Albumin: 4.2 g/dL (ref 3.5–5.0)
Alkaline Phosphatase: 56 U/L (ref 38–126)
Anion gap: 14 (ref 5–15)
BUN: 37 mg/dL — ABNORMAL HIGH (ref 8–23)
CO2: 24 mmol/L (ref 22–32)
Calcium: 9.4 mg/dL (ref 8.9–10.3)
Chloride: 89 mmol/L — ABNORMAL LOW (ref 98–111)
Creatinine, Ser: 1.22 mg/dL — ABNORMAL HIGH (ref 0.44–1.00)
GFR, Estimated: 45 mL/min — ABNORMAL LOW (ref >60)
Glucose, Bld: 120 mg/dL — ABNORMAL HIGH (ref 70–99)
Potassium: 3 mmol/L — ABNORMAL LOW (ref 3.5–5.1)
Sodium: 127 mmol/L — ABNORMAL LOW (ref 135–145)
Total Bilirubin: 0.6 mg/dL (ref 0.3–1.2)
Total Protein: 7.5 g/dL (ref 6.5–8.1)

## 2022-07-15 LAB — CBC WITH DIFFERENTIAL/PLATELET
Abs Immature Granulocytes: 0.03 10*3/uL (ref 0.00–0.07)
Basophils Absolute: 0.1 10*3/uL (ref 0.0–0.1)
Basophils Relative: 1 %
Eosinophils Absolute: 0.2 10*3/uL (ref 0.0–0.5)
Eosinophils Relative: 2 %
HCT: 36.5 % (ref 36.0–46.0)
Hemoglobin: 12.7 g/dL (ref 12.0–15.0)
Immature Granulocytes: 0 %
Lymphocytes Relative: 29 %
Lymphs Abs: 2.6 10*3/uL (ref 0.7–4.0)
MCH: 30.6 pg (ref 26.0–34.0)
MCHC: 34.8 g/dL (ref 30.0–36.0)
MCV: 88 fL (ref 80.0–100.0)
Monocytes Absolute: 0.9 10*3/uL (ref 0.1–1.0)
Monocytes Relative: 9 %
Neutro Abs: 5.3 10*3/uL (ref 1.7–7.7)
Neutrophils Relative %: 59 %
Platelets: 467 10*3/uL — ABNORMAL HIGH (ref 150–400)
RBC: 4.15 MIL/uL (ref 3.87–5.11)
RDW: 13.4 % (ref 11.5–15.5)
WBC: 9.1 10*3/uL (ref 4.0–10.5)
nRBC: 0 % (ref 0.0–0.2)

## 2022-07-15 LAB — SARS CORONAVIRUS 2 BY RT PCR: SARS Coronavirus 2 by RT PCR: NEGATIVE

## 2022-07-15 MED ORDER — POTASSIUM CHLORIDE CRYS ER 20 MEQ PO TBCR
40.0000 meq | EXTENDED_RELEASE_TABLET | Freq: Once | ORAL | Status: AC
  Administered 2022-07-15: 40 meq via ORAL
  Filled 2022-07-15: qty 2

## 2022-07-15 MED ORDER — SODIUM CHLORIDE 0.9 % IV BOLUS
1000.0000 mL | Freq: Once | INTRAVENOUS | Status: AC
  Administered 2022-07-15: 1000 mL via INTRAVENOUS

## 2022-07-15 NOTE — ED Notes (Signed)
[facility] phoned for Cardiology Consult per Dr. Darl Householder, MD request

## 2022-07-15 NOTE — ED Notes (Signed)
Update provided to patient and husband regards cards consult and pending admission to [facility]

## 2022-07-15 NOTE — ED Triage Notes (Signed)
Patient c/o left throbbing chest discomfort x today. Patient states she was instructed by her cardiologist to come and be seen. Patient states over the last few weeks she has had decreased appetite and not wanted to eat.

## 2022-07-15 NOTE — ED Notes (Signed)
[facility] called to request second page to Cardiology

## 2022-07-15 NOTE — ED Provider Notes (Signed)
[facility] Provider Note   CSN: [id number] Arrival date & time: 07/15/22  1621     History  Chief Complaint  Patient presents with   Chest Pain    Katherine Hall is a 81 y.o. female with an extensive history of heart disease, PAD, hypercholesterolemia who presents to the emergency department with left-sided chest pain.  Patient reports that she has been having this chest pain for the past 2 to 3 months.  She reports that she normally takes nitroglycerin for this, but has not taken any recently.  She states that for the past week she has decreased appetite and weakness.  She also reports nausea and vomiting.  For the past week she has been throwing up anything that she eats.  She reports good hydration.  She states that she also developed a cough and runny nose.  She denies any sick contacts.  She denies any fever.  She does report some chills and sweats at night.  Patient does report that this chest pain goes from the middle of her chest to her left side.  She denies any radiation up her jaw, or to her left arm.  Patient recently had a PET scan of her heart, which showed ischemia but no infarction.  On further review of cardiology's note, the request her to get a cath soon.  Patient's Imdur was increased to 60 mg last week.   Chest Pain Associated symptoms: cough, nausea, shortness of breath and vomiting   Associated symptoms: no abdominal pain        Home Medications Prior to Admission medications   Medication Sig Start Date End Date Taking? Authorizing Provider  acetaminophen (TYLENOL) 650 MG CR tablet Take 1,300 mg by mouth in the morning and at bedtime.    [provider]  acyclovir (ZOVIRAX) 400 MG tablet Take 800 mg by mouth daily as needed (fever blisters).     [provider]  alendronate (FOSAMAX) 70 MG tablet Take 70 mg by mouth every Friday. 10/16/18   [provider]  benzonatate (TESSALON) 200 MG capsule Take 200 mg  by mouth 3 (three) times daily as needed for cough. 04/22/21   [provider]  Carboxymethylcellul-Glycerin (LUBRICATING EYE DROPS OP) Place 1 drop into both eyes daily as needed (dry eyes).    [provider]  clopidogrel (PLAVIX) 75 MG tablet Take 1 tablet (75 mg total) by mouth daily. Resume only after 5 days ( on 05/13/21) 05/08/21   Shelly Coss, MD  diphenhydrAMINE (BENADRYL) 25 MG tablet Take 25 mg by mouth daily as needed for allergies.    [provider]  diphenhydramine-acetaminophen (TYLENOL PM) 25-500 MG TABS tablet Take 2 tablets by mouth at bedtime.    [provider]  estradiol (ESTRACE) 0.5 MG tablet Take by mouth.    [provider]  Evolocumab with Infusor ([place]) 420 MG/3.5ML SOCT Inject 3.6 mLs into the skin every 30 (thirty) days. 02/28/22   Wellington Hampshire, MD  fluticasone (FLONASE) 50 MCG/ACT nasal spray Place 1 spray into both nostrils 2 (two) times daily. 02/22/18   [provider]  hydrochlorothiazide (HYDRODIURIL) 25 MG tablet TAKE 1 TABLET BY MOUTH  DAILY 09/29/21   Wellington Hampshire, MD  isosorbide mononitrate (IMDUR) 60 MG 24 hr tablet Take 1 tablet (60 mg total) by mouth daily. 07/07/22   Nahser, Wonda Cheng, MD  levothyroxine (SYNTHROID, LEVOTHROID) 50 MCG tablet Take 50 mcg by mouth daily before breakfast. 03/31/16  [provider]  lisinopril (ZESTRIL) 20 MG tablet Take 20 mg by mouth 2 (two) times daily. 10/01/21   [provider]  metoprolol tartrate (LOPRESSOR) 25 MG tablet TAKE 1/2 TABLET(12.5 MG) BY MOUTH TWICE DAILY 04/11/22   Nahser, Wonda Cheng, MD  nitroGLYCERIN (NITROSTAT) 0.4 MG SL tablet Place 1 tablet (0.4 mg total) under the tongue every 5 (five) minutes x 3 doses as needed for chest pain. 11/09/19   Sande Rives E, PA-C  pantoprazole (PROTONIX) 40 MG tablet Take 40 mg by mouth 2 (two) times daily.    [provider]  rOPINIRole (REQUIP) 2 MG tablet Take 2 mg by  mouth at bedtime.    [provider]  traMADol (ULTRAM) 50 MG tablet Take 1 tablet (50 mg total) by mouth every 6 (six) hours as needed for severe pain. 07/13/16   Nani Skillern, PA-C  triamcinolone acetonide (KENALOG-40) 40 MG/ML injection (RADIOLOGY ONLY) Inject into the articular space. 12/13/21   [provider]      Allergies    Penicillins, Lincomycin, Naproxen sodium, Statins, Crestor [rosuvastatin], Latex, Lipitor [atorvastatin], and Tape    Review of Systems   Review of Systems  Constitutional:  Positive for appetite change.  HENT:  Positive for rhinorrhea.   Respiratory:  Positive for cough and shortness of breath.   Cardiovascular:  Positive for chest pain.  Gastrointestinal:  Positive for nausea and vomiting. Negative for abdominal pain, constipation and diarrhea.    Physical Exam Updated Vital Signs BP (!) 144/63 (BP Location: Right Arm)   Pulse 69   Temp 98.1 F (36.7 C) (Oral)   Resp 16   Ht '4\' 8"'$  (1.422 m)   Wt 53.5 kg   SpO2 96%   BMI 26.46 kg/m  Physical Exam  General: Alert and orientated x3. Patient is resting comfortably in bed in no acute distress  Eyes: Pupils equal and reactive to light, EOM intact  Head: Normocephalic, atraumatic  Neck: No lymphadenopathy noted. Cardio: Regular rate and rhythm, no murmurs, rubs or gallops. 2+ pulses to bilateral upper and lower extremities  Chest: No chest tenderness Pulmonary: Clear to ausculation bilaterally with no rales, rhonchi, and crackles  Abdomen: Soft, nontender with normoactive bowel sounds with no rebound or guarding  Skin: No rashes noted    ED Results / Procedures / Treatments   Labs (all labs ordered are listed, but only abnormal results are displayed) Labs Reviewed  CBC WITH DIFFERENTIAL/PLATELET - Abnormal; Notable for the following components:      Result Value   Platelets 467 (*)    All other components within normal limits  COMPREHENSIVE METABOLIC PANEL - Abnormal;  Notable for the following components:   Sodium 127 (*)    Potassium 3.0 (*)    Chloride 89 (*)    Glucose, Bld 120 (*)    BUN 37 (*)    Creatinine, Ser 1.22 (*)    GFR, Estimated 45 (*)    All other components within normal limits  SARS CORONAVIRUS 2 BY RT PCR  TROPONIN I (HIGH SENSITIVITY)  TROPONIN I (HIGH SENSITIVITY)    EKG EKG Interpretation  Date/Time:  Friday July 15 2022 16:31:02 EDT Ventricular Rate:  80 PR Interval:  164 QRS Duration: 92 QT Interval:  413 QTC Calculation: 477 R Axis:   35 Text Interpretation: Sinus rhythm Abnormal inferior Q waves Probable anterolateral infarct, old No significant change since last tracing Confirmed by Wandra Arthurs [phone]) on 07/15/2022 5:04:36 PM  Radiology  DG Chest Port 1 View  Result Date: 07/15/2022 CLINICAL DATA:  Chest pain EXAM: PORTABLE CHEST 1 VIEW COMPARISON:  Previous studies including the examination of 04/14/2021 FINDINGS: Cardiac size is within normal limits. Lung fields are clear of any infiltrates or pulmonary edema. There is no pleural effusion or pneumothorax. Skin folds are noted overlying the lateral aspect of lower lung fields. There is previous cardiac surgery. Degenerative changes are noted in both shoulders, much severe in the right shoulder. IMPRESSION: No active disease. Electronically Signed   By: Elmer Picker M.D.   On: 07/15/2022 16:45    Procedures Procedures    Medications Ordered in ED Medications  sodium chloride 0.9 % bolus 1,000 mL (1,000 mLs Intravenous New Bag/Given 07/15/22 1715)  potassium chloride SA (KLOR-CON M) CR tablet 40 mEq (40 mEq Oral Given 07/15/22 1711)    ED Course/ Medical Decision Making/ A&P                           Medical Decision Making This is a 81 year old female with a extensive past medical history of coronary artery disease status post CABG x5.  Patient is presenting with left-sided chest pain.  This starts in the middle of her chest, and goes to the left side  of her chest.  It does not radiate up or down.  Patient's recent cardiac PET scan showing possible ischemia, but no infarction.  Biggest concern at this time is possible ACS.  Need to rule this out.  Will obtain EKG, chest x-ray, and serial troponin.  Patient's recent cardiologist note stating that the patient may need a cath soon given her cardiac PET scan. Obtain CBC, CMP, and COVID swab.  Amount and/or Complexity of Data Reviewed Labs: ordered. Radiology: ordered.  Risk Prescription drug management. Decision regarding hospitalization.   Chest x-ray showing no acute pneumonia or cardiopulmonary disease, CBC unremarkable, CMP showing decreased sodium at 127, decreased potassium 3.0, and elevated creatinine at 1.22.  This could be due to dehydration.  EKG showing no STEMI.  Initial troponin negative.  COVID-negative.  Upon review of cardiologist note, the cardiologist stated that she would benefit from a cath. Given that she has new chest pain at rest multiple times after the Imdur increase, we are concerned of unstable angina.  Plan to admit the patient at this time for a cath.         Final Clinical Impression(s) / ED Diagnoses Final diagnoses:  Unstable angina [facility])    Rx / DC Orders ED Discharge Orders     None         Leigh Aurora, DO 07/15/22 1907    Drenda Freeze, MD 07/15/22 [phone]

## 2022-07-15 NOTE — ED Notes (Signed)
Patient and husband provided with update regarding plan of care. Resting comfortably in NAD.

## 2022-07-16 DIAGNOSIS — R079 Chest pain, unspecified: Secondary | ICD-10-CM | POA: Diagnosis not present

## 2022-07-16 DIAGNOSIS — I200 Unstable angina: Secondary | ICD-10-CM | POA: Diagnosis not present

## 2022-07-16 LAB — BASIC METABOLIC PANEL
Anion gap: 9 (ref 5–15)
BUN: 23 mg/dL (ref 8–23)
CO2: 26 mmol/L (ref 22–32)
Calcium: 9 mg/dL (ref 8.9–10.3)
Chloride: 98 mmol/L (ref 98–111)
Creatinine, Ser: 0.84 mg/dL (ref 0.44–1.00)
GFR, Estimated: >60 mL/min (ref >60)
Glucose, Bld: 102 mg/dL — ABNORMAL HIGH (ref 70–99)
Potassium: 3.5 mmol/L (ref 3.5–5.1)
Sodium: 133 mmol/L — ABNORMAL LOW (ref 135–145)

## 2022-07-16 LAB — CBC WITH DIFFERENTIAL/PLATELET
Abs Immature Granulocytes: 0.02 10*3/uL (ref 0.00–0.07)
Basophils Absolute: 0 10*3/uL (ref 0.0–0.1)
Basophils Relative: 1 %
Eosinophils Absolute: 0.3 10*3/uL (ref 0.0–0.5)
Eosinophils Relative: 4 %
HCT: 32.1 % — ABNORMAL LOW (ref 36.0–46.0)
Hemoglobin: 11.2 g/dL — ABNORMAL LOW (ref 12.0–15.0)
Immature Granulocytes: 0 %
Lymphocytes Relative: 29 %
Lymphs Abs: 1.8 10*3/uL (ref 0.7–4.0)
MCH: 30.8 pg (ref 26.0–34.0)
MCHC: 34.9 g/dL (ref 30.0–36.0)
MCV: 88.2 fL (ref 80.0–100.0)
Monocytes Absolute: 0.7 10*3/uL (ref 0.1–1.0)
Monocytes Relative: 11 %
Neutro Abs: 3.5 10*3/uL (ref 1.7–7.7)
Neutrophils Relative %: 55 %
Platelets: 365 10*3/uL (ref 150–400)
RBC: 3.64 MIL/uL — ABNORMAL LOW (ref 3.87–5.11)
RDW: 13.3 % (ref 11.5–15.5)
WBC: 6.4 10*3/uL (ref 4.0–10.5)
nRBC: 0 % (ref 0.0–0.2)

## 2022-07-16 LAB — CBC
HCT: 34 % — ABNORMAL LOW (ref 36.0–46.0)
Hemoglobin: 11.8 g/dL — ABNORMAL LOW (ref 12.0–15.0)
MCH: 30.6 pg (ref 26.0–34.0)
MCHC: 34.7 g/dL (ref 30.0–36.0)
MCV: 88.1 fL (ref 80.0–100.0)
Platelets: 382 10*3/uL (ref 150–400)
RBC: 3.86 MIL/uL — ABNORMAL LOW (ref 3.87–5.11)
RDW: 13.6 % (ref 11.5–15.5)
WBC: 6.5 10*3/uL (ref 4.0–10.5)
nRBC: 0 % (ref 0.0–0.2)

## 2022-07-16 LAB — TROPONIN I (HIGH SENSITIVITY)
Troponin I (High Sensitivity): 12 ng/L (ref <18)
Troponin I (High Sensitivity): 11 ng/L (ref <18)

## 2022-07-16 LAB — HEPATIC FUNCTION PANEL
ALT: 11 U/L (ref 0–44)
AST: 15 U/L (ref 15–41)
Albumin: 3.5 g/dL (ref 3.5–5.0)
Alkaline Phosphatase: 44 U/L (ref 38–126)
Bilirubin, Direct: <0.1 mg/dL (ref 0.0–0.2)
Total Bilirubin: 0.5 mg/dL (ref 0.3–1.2)
Total Protein: 5.9 g/dL — ABNORMAL LOW (ref 6.5–8.1)

## 2022-07-16 LAB — MAGNESIUM: Magnesium: 1.9 mg/dL (ref 1.7–2.4)

## 2022-07-16 MED ORDER — NITROGLYCERIN 0.4 MG SL SUBL: 0.4000 mg | SUBLINGUAL_TABLET | SUBLINGUAL | Status: DC | PRN

## 2022-07-16 MED ORDER — METOPROLOL TARTRATE 12.5 MG HALF TABLET
12.5000 mg | ORAL_TABLET | Freq: Two times a day (BID) | ORAL | Status: DC
  Filled 2022-07-16: qty 1

## 2022-07-16 MED ORDER — RANOLAZINE ER 500 MG PO TB12
500.0000 mg | ORAL_TABLET | Freq: Two times a day (BID) | ORAL | Status: DC
  Administered 2022-07-16 – 2022-07-19 (×7): 500 mg via ORAL
  Filled 2022-07-16 (×7): qty 1

## 2022-07-16 MED ORDER — ASPIRIN 81 MG PO TBEC: 81.0000 mg | DELAYED_RELEASE_TABLET | Freq: Every day | ORAL | Status: DC

## 2022-07-16 MED ORDER — MELATONIN 3 MG PO TABS
3.0000 mg | ORAL_TABLET | Freq: Every evening | ORAL | Status: DC | PRN
  Administered 2022-07-17 – 2022-07-18 (×3): 3 mg via ORAL
  Filled 2022-07-16 (×3): qty 1

## 2022-07-16 MED ORDER — ENOXAPARIN SODIUM 30 MG/0.3ML IJ SOSY
30.0000 mg | PREFILLED_SYRINGE | Freq: Every day | INTRAMUSCULAR | Status: DC
  Administered 2022-07-16 – 2022-07-18 (×3): 30 mg via SUBCUTANEOUS
  Filled 2022-07-16 (×3): qty 0.3

## 2022-07-16 MED ORDER — POTASSIUM CHLORIDE CRYS ER 20 MEQ PO TBCR
40.0000 meq | EXTENDED_RELEASE_TABLET | Freq: Once | ORAL | Status: AC
  Administered 2022-07-16: 40 meq via ORAL
  Filled 2022-07-16: qty 2

## 2022-07-16 MED ORDER — CLOPIDOGREL BISULFATE 75 MG PO TABS
75.0000 mg | ORAL_TABLET | Freq: Every day | ORAL | Status: DC
  Administered 2022-07-16 – 2022-07-19 (×4): 75 mg via ORAL
  Filled 2022-07-16 (×4): qty 1

## 2022-07-16 MED ORDER — ISOSORBIDE MONONITRATE ER 60 MG PO TB24
60.0000 mg | ORAL_TABLET | Freq: Every day | ORAL | Status: DC
  Administered 2022-07-16 – 2022-07-19 (×4): 60 mg via ORAL
  Filled 2022-07-16 (×4): qty 1

## 2022-07-16 MED ORDER — ROPINIROLE HCL 1 MG PO TABS
2.0000 mg | ORAL_TABLET | Freq: Every day | ORAL | Status: DC
  Administered 2022-07-16 – 2022-07-18 (×3): 2 mg via ORAL
  Filled 2022-07-16 (×4): qty 2

## 2022-07-16 MED ORDER — HYDRALAZINE HCL 20 MG/ML IJ SOLN: 10.0000 mg | INTRAMUSCULAR | Status: DC | PRN

## 2022-07-16 MED ORDER — METOPROLOL TARTRATE 25 MG PO TABS
25.0000 mg | ORAL_TABLET | Freq: Two times a day (BID) | ORAL | Status: DC
  Administered 2022-07-16 (×2): 25 mg via ORAL
  Filled 2022-07-16 (×2): qty 1

## 2022-07-16 NOTE — Progress Notes (Signed)
[facility] night cross cover note:   I was notified by RN of the patient's request for a sleep aid. I subsequently placed order for prn melatonin for insomnia.     Shontell Prosser, DO Hospitalist  

## 2022-07-16 NOTE — H&P (Signed)
History and Physical    Katherine Hall WPY:[id number] DOB: 03/13/1941 DOA: 07/15/2022  PCP: Stacie Glaze, DO  Patient coming from: Home.  Chief Complaint: Chest pain.  HPI: Katherine Hall is a 81 y.o. female with history of CAD status post CABG, hypertension, hyperlipidemia, hypothyroidism, peripheral vascular disease has been experiencing chest pain off and on for last few weeks.  But yesterday patient chest pain became more acute that patient had squeezing sensation around her chest like a rubber band wringing her heart.  This happened at least 3 times.  Over the last 3 days patient also has been having nausea vomiting.  Denies any diarrhea or abdominal pain no fever chills.  Patient's Imdur dose was recently increased from 30 mg and was changed to 60 mg.  Patient had a recent myocardial perfusion scan which was positive for ischemia in inferior myocardium.  ED Course: In the ER cardiac markers were negative EKG was showing nonspecific changes.  Labs did show hyponatremia and hypokalemia with acute renal failure for which patient was given replacement and fluids.  Review of Systems: As per HPI, rest all negative.   Past Medical History:  Diagnosis Date   Anemia    Arthritis    Coronary artery disease    s/p CABG in 2017 // Myoview 11/21: No ischemia or infarction, EF > 65, low risk  // [place] >> cath 3/22: S-RPDA w severe disease - PCI unsuccessful & c/b peri-procedure MI (acute closure w wire manipulation>>inf ST elev) >> Med Rx    Echocardiogram 02/2021    EF 65-70, no RWMA, mild LVH, Gr 1 DD, normal RVSF, RVSP 19.5, mild AI, mild AV sclerosis   Familial hypercholesterolemia    a. h/o intolerance of statins, prev on Repatha but insurance stopped covering.   Gastric ulcer    a. h/o bleeding ulcer 2009 requiring 3 pints of blood.   History of removal of cyst    a. 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   HTN (hypertension)    Hypothyroidism    PAD  (peripheral artery disease) (HCC)    S/p L SFA PTA // ABIs/LE arterial US 4/22: R 0.7; L 0.86 // R SFA 75-99; L SFA angioplasty site 30-49, distal 50-74 // ectatic distal aorta 2.6 x 2.6 cm   Prediabetes     Past Surgical History:  Procedure Laterality Date   ABDOMINAL AORTOGRAM W/LOWER EXTREMITY Bilateral 11/06/2019   Procedure: ABDOMINAL AORTOGRAM W/LOWER EXTREMITY;  Surgeon: Wellington Hampshire, MD;  Location: [facility];  Service: Cardiovascular;  Laterality: Bilateral;   ABDOMINAL AORTOGRAM W/LOWER EXTREMITY N/A 03/31/2021   Procedure: ABDOMINAL AORTOGRAM W/LOWER EXTREMITY;  Surgeon: Wellington Hampshire, MD;  Location: [facility] [facility];  Service: Cardiovascular;  Laterality: N/A;   ABDOMINAL AORTOGRAM W/LOWER EXTREMITY N/A 09/15/2021   Procedure: ABDOMINAL AORTOGRAM W/LOWER EXTREMITY;  Surgeon: Wellington Hampshire, MD;  Location: [facility];  Service: Cardiovascular;  Laterality: N/A;   ABDOMINAL HYSTERECTOMY     APPENDECTOMY     CARDIAC CATHETERIZATION N/A 07/05/2016   Procedure: Left Heart Cath and Coronary Angiography;  Surgeon: Burnell Blanks, MD;  Location: [facility];  Service: Cardiovascular;  Laterality: N/A;   CARDIAC SURGERY     cyst, not on heart.   CORONARY ARTERY BYPASS GRAFT N/A 07/07/2016   Procedure: CORONARY ARTERY BYPASS GRAFTING (CABG) x 5 with endoscopic harvesting of the right greater saphenous vein;  Surgeon: Gaye Pollack, MD;  Location: [facility];  Service: Open Heart Surgery;  Laterality: N/A;   CORONARY BALLOON ANGIOPLASTY N/A 02/03/2021   Procedure: CORONARY BALLOON ANGIOPLASTY;  Surgeon: Wellington Hampshire, MD;  Location: [facility];  Service: Cardiovascular;  Laterality: N/A;  svg to pda   ESOPHAGOGASTRODUODENOSCOPY (EGD) WITH PROPOFOL N/A 05/08/2021   Procedure: ESOPHAGOGASTRODUODENOSCOPY (EGD) WITH PROPOFOL;  Surgeon: Arta Silence, MD;  Location: [facility];  Service: Endoscopy;  Laterality: N/A;   HEMORRHOID SURGERY      INTRAOPERATIVE TRANSESOPHAGEAL ECHOCARDIOGRAM N/A 07/07/2016   Procedure: INTRAOPERATIVE TRANSESOPHAGEAL ECHOCARDIOGRAM;  Surgeon: Gaye Pollack, MD;  Location: [facility];  Service: Open Heart Surgery;  Laterality: N/A;   LEFT HEART CATH AND CORS/GRAFTS ANGIOGRAPHY N/A 02/03/2021   Procedure: LEFT HEART CATH AND CORS/GRAFTS ANGIOGRAPHY;  Surgeon: Wellington Hampshire, MD;  Location: [facility];  Service: Cardiovascular;  Laterality: N/A;   PERIPHERAL VASCULAR ATHERECTOMY Left 09/15/2021   Procedure: PERIPHERAL VASCULAR ATHERECTOMY;  Surgeon: Wellington Hampshire, MD;  Location: [facility];  Service: Cardiovascular;  Laterality: Left;   PERIPHERAL VASCULAR BALLOON ANGIOPLASTY  11/06/2019   Procedure: PERIPHERAL VASCULAR BALLOON ANGIOPLASTY;  Surgeon: Wellington Hampshire, MD;  Location: [facility];  Service: Cardiovascular;;  cutting and dcb   PERIPHERAL VASCULAR BALLOON ANGIOPLASTY Right 03/31/2021   Procedure: PERIPHERAL VASCULAR BALLOON ANGIOPLASTY;  Surgeon: Wellington Hampshire, MD;  Location: [facility];  Service: Cardiovascular;  Laterality: Right;  SFA   SHOULDER SURGERY       reports that she has never smoked. She has never used smokeless tobacco. She reports that she does not drink alcohol and does not use drugs.  Allergies  Allergen Reactions   Penicillins Anaphylaxis    Did it involve swelling of the face/tongue/throat, SOB, or low BP? Yes Did it involve sudden or severe rash/hives, skin peeling, or any reaction on the inside of your mouth or nose? No Did you need to seek medical attention at a hospital or doctor's office? Yes When did it last happen?      10+ years If all above answers are "NO", may proceed with cephalosporin use.     Lincomycin Swelling and Other (See Comments)    passed out   Naproxen Sodium Other (See Comments)    Stomach ulcers   Statins Other (See Comments)    Myalgias   Crestor [Rosuvastatin]     myalgias   Latex Other (See Comments)     Blisters when in contact with skin   Lipitor [Atorvastatin]     myalgias   Tape     blistering    Family History  Problem Relation Age of Onset   Stroke Mother        Stroke age 8   CAD Father        died of MI age 75   Coronary artery disease Sister        one sister had massive MI at 25, another sister has 98 stents   Coronary artery disease Brother        one brother had MI at age 23 and died at 31, another has had 4 MIs and 2 bypasses    Prior to Admission medications   Medication Sig Start Date End Date Taking? Authorizing Provider  acetaminophen (TYLENOL) 650 MG CR tablet Take 1,300 mg by mouth in the morning and at bedtime.    [provider]  acyclovir (ZOVIRAX) 400 MG tablet Take 800 mg by mouth daily as  needed (fever blisters).     [provider]  alendronate (FOSAMAX) 70 MG tablet Take 70 mg by mouth every Friday. 10/16/18   [provider]  benzonatate (TESSALON) 200 MG capsule Take 200 mg by mouth 3 (three) times daily as needed for cough. 04/22/21   [provider]  Carboxymethylcellul-Glycerin (LUBRICATING EYE DROPS OP) Place 1 drop into both eyes daily as needed (dry eyes).    [provider]  clopidogrel (PLAVIX) 75 MG tablet Take 1 tablet (75 mg total) by mouth daily. Resume only after 5 days ( on 05/13/21) 05/08/21   Shelly Coss, MD  diphenhydrAMINE (BENADRYL) 25 MG tablet Take 25 mg by mouth daily as needed for allergies.    [provider]  diphenhydramine-acetaminophen (TYLENOL PM) 25-500 MG TABS tablet Take 2 tablets by mouth at bedtime.    [provider]  estradiol (ESTRACE) 0.5 MG tablet Take by mouth.    [provider]  Evolocumab with Infusor ([place]) 420 MG/3.5ML SOCT Inject 3.6 mLs into the skin every 30 (thirty) days. 02/28/22   Wellington Hampshire, MD  fluticasone (FLONASE) 50 MCG/ACT nasal spray Place 1 spray into both nostrils 2 (two) times daily. 02/22/18    [provider]  hydrochlorothiazide (HYDRODIURIL) 25 MG tablet TAKE 1 TABLET BY MOUTH  DAILY 09/29/21   Wellington Hampshire, MD  isosorbide mononitrate (IMDUR) 60 MG 24 hr tablet Take 1 tablet (60 mg total) by mouth daily. 07/07/22   Nahser, Wonda Cheng, MD  levothyroxine (SYNTHROID, LEVOTHROID) 50 MCG tablet Take 50 mcg by mouth daily before breakfast. 03/31/16   [provider]  lisinopril (ZESTRIL) 20 MG tablet Take 20 mg by mouth 2 (two) times daily. 10/01/21   [provider]  metoprolol tartrate (LOPRESSOR) 25 MG tablet TAKE 1/2 TABLET(12.5 MG) BY MOUTH TWICE DAILY 04/11/22   Nahser, Wonda Cheng, MD  nitroGLYCERIN (NITROSTAT) 0.4 MG SL tablet Place 1 tablet (0.4 mg total) under the tongue every 5 (five) minutes x 3 doses as needed for chest pain. 11/09/19   Sande Rives E, PA-C  pantoprazole (PROTONIX) 40 MG tablet Take 40 mg by mouth 2 (two) times daily.    [provider]  rOPINIRole (REQUIP) 2 MG tablet Take 2 mg by mouth at bedtime.    [provider]  traMADol (ULTRAM) 50 MG tablet Take 1 tablet (50 mg total) by mouth every 6 (six) hours as needed for severe pain. 07/13/16   Nani Skillern, PA-C  triamcinolone acetonide (KENALOG-40) 40 MG/ML injection (RADIOLOGY ONLY) Inject into the articular space. 12/13/21   [provider]    Physical Exam: Constitutional: Moderately built and nourished. Vitals:   07/15/22 2030 07/15/22 2130 07/15/22 2254 07/15/22 2300  BP: (!) 141/84 (!) 143/105  128/65  Pulse: 77 77  84  Resp: '18 18  17  '$ Temp: 98.2 F (36.8 C)   98.4 F (36.9 C)  TempSrc: Oral   Oral  SpO2: 97% 97%  98%  Weight:   53.5 kg   Height:   '4\' 8"'$  (1.422 m)    Eyes: Anicteric no pallor. ENMT: No discharge from the ears eyes nose and mouth. Neck: No mass felt.  No neck rigidity. Respiratory: No rhonchi or crepitations. Cardiovascular: S1 S2 heard. Abdomen: Soft nontender bowel sound present. Musculoskeletal: No edema. Skin: No  rash. Neurologic: Alert awake oriented to time place and person.  Moves all extremities. Psychiatric: Appears normal.  Normal affect.   Labs on Admission:  I have personally reviewed following labs and imaging studies  CBC: Recent Labs  Lab 07/15/22 1634  WBC 9.1  NEUTROABS 5.3  HGB 12.7  HCT 36.5  MCV 88.0  PLT 194*   Basic Metabolic Panel: Recent Labs  Lab 07/15/22 1634  NA 127*  K 3.0*  CL 89*  CO2 24  GLUCOSE 120*  BUN 37*  CREATININE 1.22*  CALCIUM 9.4   GFR: Estimated Creatinine Clearance: 24.7 mL/min (A) (by C-G formula based on SCr of 1.22 mg/dL (H)). Liver Function Tests: Recent Labs  Lab 07/15/22 1634  AST 17  ALT 13  ALKPHOS 56  BILITOT 0.6  PROT 7.5  ALBUMIN 4.2   No results for input(s): "LIPASE", "AMYLASE" in the last 168 hours. No results for input(s): "AMMONIA" in the last 168 hours. Coagulation Profile: No results for input(s): "INR", "PROTIME" in the last 168 hours. Cardiac Enzymes: No results for input(s): "CKTOTAL", "CKMB", "CKMBINDEX", "TROPONINI" in the last 168 hours. BNP (last 3 results) No results for input(s): "PROBNP" in the last 8760 hours. HbA1C: No results for input(s): "HGBA1C" in the last 72 hours. CBG: No results for input(s): "GLUCAP" in the last 168 hours. Lipid Profile: No results for input(s): "CHOL", "HDL", "LDLCALC", "TRIG", "CHOLHDL", "LDLDIRECT" in the last 72 hours. Thyroid Function Tests: No results for input(s): "TSH", "T4TOTAL", "FREET4", "T3FREE", "THYROIDAB" in the last 72 hours. Anemia Panel: No results for input(s): "VITAMINB12", "FOLATE", "FERRITIN", "TIBC", "IRON", "RETICCTPCT" in the last 72 hours. Urine analysis:    Component Value Date/Time   COLORURINE YELLOW 04/14/2021 1406   APPEARANCEUR HAZY (A) 04/14/2021 1406   LABSPEC >1.030 (H) 04/14/2021 1406   PHURINE 5.5 04/14/2021 1406   GLUCOSEU NEGATIVE 04/14/2021 1406   [place] 04/14/2021 1406   [place] 04/14/2021 1406    [place] 04/14/2021 1406   PROTEINUR NEGATIVE 04/14/2021 1406   NITRITE NEGATIVE 04/14/2021 1406   [place] 04/14/2021 1406   Sepsis Labs: '@LABRCNTIP'$ (procalcitonin:4,lacticidven:4) ) Recent Results (from the past 240 hour(s))  SARS Coronavirus 2 by RT PCR (hospital order, performed in [facility] hospital lab) *cepheid single result test* Anterior Nasal Swab     Status: None   Collection Time: 07/15/22  5:19 PM   Specimen: Anterior Nasal Swab  Result Value Ref Range Status   SARS Coronavirus 2 by RT PCR NEGATIVE NEGATIVE Final    Comment: (NOTE) SARS-CoV-2 target nucleic acids are NOT DETECTED.  The SARS-CoV-2 RNA is generally detectable in upper and lower respiratory specimens during the acute phase of infection. The lowest concentration of SARS-CoV-2 viral copies this assay can detect is 250 copies / mL. A negative result does not preclude SARS-CoV-2 infection and should not be used as the sole basis for treatment or other patient management decisions.  A negative result may occur with improper specimen collection / handling, submission of specimen other than nasopharyngeal swab, presence of viral mutation(s) within the areas targeted by this assay, and inadequate number of viral copies (<250 copies / mL). A negative result must be combined with clinical observations, patient history, and epidemiological information.  Fact Sheet for Patients:   https://www.patel.info/  Fact Sheet for Healthcare Providers: https://hall.com/  This test is not yet approved or  cleared by the [place] FDA and has been authorized for detection and/or diagnosis of SARS-CoV-2 by FDA under an Emergency Use Authorization (EUA).  This EUA will remain in effect (meaning this test can be used) for the duration of the COVID-19 declaration under Section 564(b)(1) of  the Act, 21 U.S.C. section 360bbb-3(b)(1), unless the authorization is  terminated or revoked sooner.  Performed at [facility], [address] [place], [place] [address]      Radiological Exams on Admission: DG Chest [facility] 1 View  Result Date: 07/15/2022 CLINICAL DATA:  Chest pain EXAM: PORTABLE CHEST 1 VIEW COMPARISON:  Previous studies including the examination of 04/14/2021 FINDINGS: Cardiac size is within normal limits. Lung fields are clear of any infiltrates or pulmonary edema. There is no pleural effusion or pneumothorax. Skin folds are noted overlying the lateral aspect of lower lung fields. There is previous cardiac surgery. Degenerative changes are noted in both shoulders, much severe in the right shoulder. IMPRESSION: No active disease. Electronically Signed   By: Elmer Picker M.D.   On: 07/15/2022 16:45    EKG: Independently reviewed.  Normal sinus rhythm.  Assessment/Plan Principal Problem:   Unstable angina (HCC) Active Problems:   Hypothyroidism   Essential hypertension   Hyperlipidemia   Anemia, unspecified   Chest pain   S/P CABG x 5   Coronary artery disease involving native coronary artery of native heart with angina pectoris ([place])   Peripheral arterial disease ([place])   #1.  Chest pain concerning for unstable angina with recent myocardial perfusion scan done on July 05, 2022 showing features concerning for reversible ischemia.  We will continue with patient's Plavix beta-blockers and keep patient n.p.o. past midnight.  Consult cardiology for further recommendations. #2.  Nausea vomiting could be from gastroenteritis.  LFTs are normal.  Abdomen appears benign.  If vomiting persist may consider further imaging. #3.  Acute renal failure with hyponatremia and hypokalemia likely from vomiting.  Replace recheck.  Ordered lisinopril and hydrochlorothiazide. #4.  Hypertension -we will hold lisinopril and HCTZ due to acute renal failure and hyponatremia.  Continue Imdur and beta-blockers.  As needed IV hydralazine has been  ordered. #5.  Hypothyroidism -Synthroid dose needs to be verified. #6.  Hyperlipidemia -takes Repatha at home.   DVT prophylaxis: Lovenox. Code Status: Full code. Family Communication: Gust with patient. Disposition Plan: Home. Consults called: We will need to consult cardiology. Admission status: Observation.   Rise Patience MD [facility] Pager [phone].  If 7PM-7AM, please contact night-coverage [email or web address] Password [facility]  07/16/2022, 12:04 AM

## 2022-07-16 NOTE — Consult Note (Signed)
Cardiology Consultation:   Patient ID: Katherine Hall MRN: [id number]; DOB: 1941-09-13  Admit date: 07/15/2022 Date of Consult: 07/16/2022  PCP:  Stacie Glaze, DO   [facility] Providers Cardiologist:  Mertie Moores, MD   {    Patient Profile:   Katherine Hall is a 81 y.o. female with a hx of CAD s/p CABG 2017, PAD s/p  bilateral SFA intervention, HTN, Familial HLD, intolerance to statin, anemia, GI bleed 05/2021, hypothyroidism, who is being seen 07/16/2022 for the evaluation of chest pain at the request of Dr.Kakrakandy  History of Present Illness:   Katherine Hall with above PMH presented to the ER 07/15/2022 complaining intermittent chest pain over the past few months.  She describes left-sided chest pain radiating to midsternal area.  Chest pain is a squeezing sensation.  She also mentioned poor appetite, weakness, nausea, vomiting, cough, runny nose over the past week.   She notes that over the past week she has had an escalation in her angina.  She cannot sweep the floors without having anxiety about falling when she has angina with activity, because it feels like she is going to be knocked down.  CP with ADLS, which is new for her.  She notes no SOB, PND, orthopnea.  No syncope.  She have feelt week this past week  Diagnostic at admission revealed hyponatremia 127, hypokalemia 3, hypochloremia 89, elevated creatinine 1.22, BUN 37, and GFR 45.  CBC revealed elevated platelet 467k.  High sensitive troponin 14 >11 >12 >11.  Chest x-ray revealed no acute finding.  EKG revealed sinus rhythm 80 bpm, old inferolateral Q waves, poor R wave progression, nonspecific abnormality.  She was admitted to hospital medicine service for gastroenteritis and AKI, home meds including lisinopril and HCTZ held due to AKI and electrolyte imbalance.  Cardiology is consulted today for chest pain.    She follows Dr Acie Fredrickson for cardiology. She has hx of CABG  in 2017 w/ LIMA-LAD, SVG-Diag, SVG-OM1-OM2, SVG-PDA.  She was admitted 02/2021 for unstable angina. Echocardiogram obtained on 02/03/2021 showed EF 65 to 70%, grade 1 DD, RVSP 19.5 mmHg, mild AI.  Cardiac catheterization 02/03/22 shoed  severe three-vessel native CAD with patent LIMA to LAD, patent SVG to OM1/OM 3, patent SVG to diagonal was 60% anastomosis stenosis, however SVG to right PDA had 90% stenosis proximally followed by another 90% stenosis leading into a aneurysmal segment followed by a 95% stenosis.  Angioplasty was attempted to SVG to right PDA, however unsuccessful due to inability to cross the stenosis beyond the aneurysmal segment, there was loss of flow and acute closure with wire manipulation.  Multiple CTO wire were used without success.  Patient developed mild acute inferior ST elevation on the heart monitor with throat discomfort.  Symptom improved with nitroglycerin drip.  Postprocedure, she had hypotension as well. She was ultimately recommend medical therapy with indefinite DAPT with ASA and Plavix, Imdur was added. She had continued anginal symptom, metoprolol was added at follow up visit 02/23/21.   She suffered GI bleed requiring transfusion 05/2021, EGD without active bleeding but erosive gastropathy and duodenal erosions. She was cleared by GI for ASA + Plavix after 5 days hold. Also recommend PPI.   She has PAD, follows Dr Fletcher Anon, has chronic claudication with resting calf pain. Underwent bilateral SFA interventions in the past, most recent intervention was left SFA orbital atherectomy and drug-coated balloon angioplasty on 09/15/21. She has residual disease, recommended medical therapy and exercise. She was advised continue Plavix without ASA  given hx of GI bleed. Arterial doppler was ordered and pending since 04/25/22.   She was last seen by Dr Acie Fredrickson in the office 04/04/22, was getting started at Cardiac rehab, had improved angina and episode of dyspnea, was in the process starting Repatha. She was arranged for cardiac PET, which was done  on 07/05/22 showed intermediate risk study, abnormal perfusion in the RCA territory, consistent with ischemia. Patient was called and arranged for follow up appointment.      Past Medical History:  Diagnosis Date   Anemia    Arthritis    Coronary artery disease    s/p CABG in 2017 // Myoview 11/21: No ischemia or infarction, EF > 65, low risk  // [place] >> cath 3/22: S-RPDA w severe disease - PCI unsuccessful & c/b peri-procedure MI (acute closure w wire manipulation>>inf ST elev) >> Med Rx    Echocardiogram 02/2021    EF 65-70, no RWMA, mild LVH, Gr 1 DD, normal RVSF, RVSP 19.5, mild AI, mild AV sclerosis   Familial hypercholesterolemia    a. h/o intolerance of statins, prev on Repatha but insurance stopped covering.   Gastric ulcer    a. h/o bleeding ulcer 2009 requiring 3 pints of blood.   History of removal of cyst    a. 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   HTN (hypertension)    Hypothyroidism    PAD (peripheral artery disease) (HCC)    S/p L SFA PTA // ABIs/LE arterial US 4/22: R 0.7; L 0.86 // R SFA 75-99; L SFA angioplasty site 30-49, distal 50-74 // ectatic distal aorta 2.6 x 2.6 cm   Prediabetes     Past Surgical History:  Procedure Laterality Date   ABDOMINAL AORTOGRAM W/LOWER EXTREMITY Bilateral 11/06/2019   Procedure: ABDOMINAL AORTOGRAM W/LOWER EXTREMITY;  Surgeon: Wellington Hampshire, MD;  Location: [facility];  Service: Cardiovascular;  Laterality: Bilateral;   ABDOMINAL AORTOGRAM W/LOWER EXTREMITY N/A 03/31/2021   Procedure: ABDOMINAL AORTOGRAM W/LOWER EXTREMITY;  Surgeon: Wellington Hampshire, MD;  Location: [facility];  Service: Cardiovascular;  Laterality: N/A;   ABDOMINAL AORTOGRAM W/LOWER EXTREMITY N/A 09/15/2021   Procedure: ABDOMINAL AORTOGRAM W/LOWER EXTREMITY;  Surgeon: Wellington Hampshire, MD;  Location: [facility];  Service: Cardiovascular;  Laterality: N/A;   ABDOMINAL HYSTERECTOMY     APPENDECTOMY     CARDIAC  CATHETERIZATION N/A 07/05/2016   Procedure: Left Heart Cath and Coronary Angiography;  Surgeon: Burnell Blanks, MD;  Location: [facility];  Service: Cardiovascular;  Laterality: N/A;   CARDIAC SURGERY     cyst, not on heart.   CORONARY ARTERY BYPASS GRAFT N/A 07/07/2016   Procedure: CORONARY ARTERY BYPASS GRAFTING (CABG) x 5 with endoscopic harvesting of the right greater saphenous vein;  Surgeon: Gaye Pollack, MD;  Location: [facility];  Service: Open Heart Surgery;  Laterality: N/A;   CORONARY BALLOON ANGIOPLASTY N/A 02/03/2021   Procedure: CORONARY BALLOON ANGIOPLASTY;  Surgeon: Wellington Hampshire, MD;  Location: [facility];  Service: Cardiovascular;  Laterality: N/A;  svg to pda   ESOPHAGOGASTRODUODENOSCOPY (EGD) WITH PROPOFOL N/A 05/08/2021   Procedure: ESOPHAGOGASTRODUODENOSCOPY (EGD) WITH PROPOFOL;  Surgeon: Arta Silence, MD;  Location: [facility];  Service: Endoscopy;  Laterality: N/A;   HEMORRHOID SURGERY     INTRAOPERATIVE TRANSESOPHAGEAL ECHOCARDIOGRAM N/A 07/07/2016   Procedure: INTRAOPERATIVE TRANSESOPHAGEAL ECHOCARDIOGRAM;  Surgeon: Gaye Pollack, MD;  Location: [facility];  Service: Open Heart Surgery;  Laterality: N/A;   LEFT HEART CATH AND CORS/GRAFTS ANGIOGRAPHY N/A 02/03/2021   Procedure: LEFT HEART CATH AND CORS/GRAFTS ANGIOGRAPHY;  Surgeon: Wellington Hampshire, MD;  Location: [facility];  Service: Cardiovascular;  Laterality: N/A;   PERIPHERAL VASCULAR ATHERECTOMY Left 09/15/2021   Procedure: PERIPHERAL VASCULAR ATHERECTOMY;  Surgeon: Wellington Hampshire, MD;  Location: [facility];  Service: Cardiovascular;  Laterality: Left;   PERIPHERAL VASCULAR BALLOON ANGIOPLASTY  11/06/2019   Procedure: PERIPHERAL VASCULAR BALLOON ANGIOPLASTY;  Surgeon: Wellington Hampshire, MD;  Location: [facility];  Service: Cardiovascular;;  cutting and dcb   PERIPHERAL VASCULAR BALLOON ANGIOPLASTY Right 03/31/2021   Procedure: PERIPHERAL VASCULAR BALLOON ANGIOPLASTY;   Surgeon: Wellington Hampshire, MD;  Location: [facility];  Service: Cardiovascular;  Laterality: Right;  SFA   SHOULDER SURGERY       Home Medications:  Prior to Admission medications   Medication Sig Start Date End Date Taking? Authorizing Provider  acetaminophen (TYLENOL) 650 MG CR tablet Take 1,300 mg by mouth in the morning and at bedtime.    [provider]  acyclovir (ZOVIRAX) 400 MG tablet Take 800 mg by mouth daily as needed (fever blisters).     [provider]  alendronate (FOSAMAX) 70 MG tablet Take 70 mg by mouth every Friday. 10/16/18   [provider]  benzonatate (TESSALON) 200 MG capsule Take 200 mg by mouth 3 (three) times daily as needed for cough. 04/22/21   [provider]  Carboxymethylcellul-Glycerin (LUBRICATING EYE DROPS OP) Place 1 drop into both eyes daily as needed (dry eyes).    [provider]  clopidogrel (PLAVIX) 75 MG tablet Take 1 tablet (75 mg total) by mouth daily. Resume only after 5 days ( on 05/13/21) 05/08/21   Shelly Coss, MD  diphenhydrAMINE (BENADRYL) 25 MG tablet Take 25 mg by mouth daily as needed for allergies.    [provider]  diphenhydramine-acetaminophen (TYLENOL PM) 25-500 MG TABS tablet Take 2 tablets by mouth at bedtime.    [provider]  estradiol (ESTRACE) 0.5 MG tablet Take by mouth.    [provider]  Evolocumab with Infusor ([place]) 420 MG/3.5ML SOCT Inject 3.6 mLs into the skin every 30 (thirty) days. 02/28/22   Wellington Hampshire, MD  fluticasone (FLONASE) 50 MCG/ACT nasal spray Place 1 spray into both nostrils 2 (two) times daily. 02/22/18   [provider]  hydrochlorothiazide (HYDRODIURIL) 25 MG tablet TAKE 1 TABLET BY MOUTH  DAILY 09/29/21   Wellington Hampshire, MD  isosorbide mononitrate (IMDUR) 60 MG 24 hr tablet Take 1 tablet (60 mg total) by mouth daily. 07/07/22   Nahser, Wonda Cheng, MD  levothyroxine (SYNTHROID, LEVOTHROID) 50 MCG  tablet Take 50 mcg by mouth daily before breakfast. 03/31/16   [provider]  lisinopril (ZESTRIL) 20 MG tablet Take 20 mg by mouth 2 (two) times daily. 10/01/21   [provider]  metoprolol tartrate (LOPRESSOR) 25 MG tablet TAKE 1/2 TABLET(12.5 MG) BY MOUTH TWICE DAILY 04/11/22   Nahser, Wonda Cheng, MD  nitroGLYCERIN (NITROSTAT) 0.4 MG SL tablet Place 1 tablet (0.4 mg total) under the tongue every 5 (five) minutes x 3 doses as needed for chest pain. 11/09/19   Sande Rives E, PA-C  pantoprazole (PROTONIX) 40 MG tablet Take 40 mg by mouth 2 (two) times daily.    [provider]  rOPINIRole (REQUIP) 2 MG tablet Take 2 mg by mouth at bedtime.  [provider]  traMADol (ULTRAM) 50 MG tablet Take 1 tablet (50 mg total) by mouth every 6 (six) hours as needed for severe pain. 07/13/16   Nani Skillern, PA-C  triamcinolone acetonide (KENALOG-40) 40 MG/ML injection (RADIOLOGY ONLY) Inject into the articular space. 12/13/21   [provider]    Inpatient Medications: Scheduled Meds:  clopidogrel  75 mg Oral Daily   enoxaparin (LOVENOX) injection  30 mg Subcutaneous Daily   isosorbide mononitrate  60 mg Oral Daily   metoprolol tartrate  25 mg Oral BID   ranolazine  500 mg Oral BID   rOPINIRole  2 mg Oral QHS   Continuous Infusions:  PRN Meds: hydrALAZINE, nitroGLYCERIN  Allergies:    Allergies  Allergen Reactions   Penicillins Anaphylaxis    Did it involve swelling of the face/tongue/throat, SOB, or low BP? Yes Did it involve sudden or severe rash/hives, skin peeling, or any reaction on the inside of your mouth or nose? No Did you need to seek medical attention at a hospital or doctor's office? Yes When did it last happen?      10+ years If all above answers are "NO", may proceed with cephalosporin use.     Lincomycin Swelling and Other (See Comments)    passed out   Naproxen Sodium Other (See Comments)    Stomach ulcers   Statins Other  (See Comments)    Myalgias   Crestor [Rosuvastatin]     myalgias   Latex Other (See Comments)    Blisters when in contact with skin   Lipitor [Atorvastatin]     myalgias   Tape     blistering    Social History:   Social History   Socioeconomic History   Marital status: Married    Spouse name: Not on file   Number of children: Not on file   Years of education: Not on file   Highest education level: Not on file  Occupational History   Not on file  Tobacco Use   Smoking status: Never   Smokeless tobacco: Never  Vaping Use   Vaping Use: Never used  Substance and Sexual Activity   Alcohol use: No    Comment: social   Drug use: No   Sexual activity: Not on file  Other Topics Concern   Not on file  Social History Narrative   Not on file   Social Determinants of Health   Financial Resource Strain: Not on file  Food Insecurity: Not on file  Transportation Needs: Not on file  Physical Activity: Not on file  Stress: Not on file  Social Connections: Not on file  Intimate Partner Violence: Not on file    Family History:    Family History  Problem Relation Age of Onset   Stroke Mother        Stroke age 81   CAD Father        died of MI age 65   Coronary artery disease Sister        one sister had massive MI at 79, another sister has 31 stents   Coronary artery disease Brother        one brother had MI at age 33 and died at 30, another has had 4 MIs and 2 bypasses     ROS:  Please see the history of present illness.  Notes dry mouth; she is NPO a time of evaluation All other ROS reviewed and negative.     Physical Exam/Data:  Vitals:   07/15/22 2130 07/15/22 2254 07/15/22 2300 07/16/22 0331  BP: (!) 143/105  128/65 119/67  Pulse: 77  84 96  Resp: '18  17 19  '$ Temp:   98.4 F (36.9 C) 97.6 F (36.4 C)  TempSrc:   Oral Oral  SpO2: 97%  98% 97%  Weight:  53.5 kg    Height:  '4\' 8"'$  (1.422 m)      Intake/Output Summary (Last 24 hours) at 07/16/2022  0910 Last data filed at 07/16/2022 0300 Gross per 24 hour  Intake 1050 ml  Output --  Net 1050 ml      07/15/2022   10:54 PM 07/15/2022    4:34 PM 04/04/2022    1:26 PM  Last 3 Weights  Weight (lbs) 117 lb 15.1 oz 118 lb 124 lb 6.4 oz  Weight (kg) 53.5 kg 53.524 kg 56.427 kg     Body mass index is 26.44 kg/m.    Gen: Mild distress, elderly female  Neck: No JVD Ears: no Pilar Plate Sign Cardiac: No Rubs or Gallops, no murmur, RRR +2 radial pulses,+2 R Femoral  Respiratory: Clear to auscultation bilaterally, normal effort, normal  respiratory rate GI: Soft, nontender, non-distended  MS: No  edema;  moves all extremities Integument: Skin feels warm Neuro:  At time of evaluation, alert and oriented to person/place/time/situation  Psych: Normal affect, patient feels worried   EKG:  The EKG was personally reviewed and demonstrates:  SR with inferior and anterolateral q waves   Relevant CV Studies:  Cardiac PET 07/05/22:      LV perfusion is abnormal. There is evidence of ischemia. There is no evidence of infarction. Defect 1: There is a small defect with moderate reduction in uptake present in the apical to mid inferior location(s) that is reversible. There is abnormal wall motion in the defect area. Consistent with ischemia. The defect is consistent with abnormal perfusion in the RCA territory.   Rest left ventricular function is normal. Rest EF: 61 %. Stress left ventricular function is normal. Stress EF: 66 %. End diastolic cavity size is normal.   Myocardial blood flow was computed to be 1.39m/g/min at rest and 1.849mg/min at stress. Global myocardial blood flow reserve was 1.61 and was abnormal. Myocardial blood flow reserve was abnormal in the RCA distribution (1.39) consistent with the ischemia seen on the perfusion imaging. The myocardial blood flow was mildly abnormal in the LAD/LCX distributions. Due to prior CABG, MBF flow values are not expected to be normal. Quite abnormal in  the RCA distribution which matches the perfusion defect.   Myocardial blood flow was computed to be 1.1643m/min at rest and 1.51m76mmin at stress. Global myocardial blood flow reserve was 1.61 and was abnormal.   Coronary calcium assessment not performed due to prior revascularization.   Findings are consistent with ischemia. The study is intermediate risk.    LHC from 02/03/22:  Prox LAD to Mid LAD lesion is 100% stenosed. Mid Cx lesion is 100% stenosed. 1st Mrg lesion is 100% stenosed. Prox RCA lesion is 40% stenosed. Prox RCA to Mid RCA lesion is 70% stenosed. RPDA lesion is 100% stenosed. RPAV lesion is 90% stenosed. Dist RCA lesion is 60% stenosed. SVG and is normal in caliber. The graft exhibits no disease. SVG. Insertion lesion is 60% stenosed. LIMA and is normal in caliber. The graft exhibits no disease. Origin to Prox Graft lesion is 90% stenosed. Mid Graft lesion is 95% stenosed. Mid Graft to Dist Graft lesion is 40% stenosed.  Post intervention, there is a 70% residual stenosis. Balloon angioplasty was performed using a BALLOON SAPPHIRE 2.5X12. Prox Graft lesion is 90% stenosed.   1.  Severe underlying three-vessel coronary artery disease with patent grafts including LIMA to LAD, SVG to OM1/OM 3, SVG to diagonal with 60% anastomosis stenosis and SVG to right PDA.  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 2.  Left ventricular angiography was not performed.  EF was normal by echo.  Normal LVEDP. 3.  Unsuccessful attempted angioplasty to SVG to right PDA due to inability to cross the stenosis beyond the aneurysmal segment.  There was loss of flow and acute closure with wire manipulation.  Multiple CTO wires were used but with no success.  The patient developed mild inferior ST elevation on the monitor with throat discomfort.  Symptoms improved with nitroglycerin  drip.   Recommendations: The patient will have an inferior infarct although the right PDA distribution is relatively small.  The native RCA is diffusely diseased and tortuous and likely not suitable for PCI. I elected to place the patient on nitroglycerin drip and will continue medical therapy for her coronary artery disease.   The procedure was performed via the right common femoral artery after unsuccessful access via the left radial artery due to suspected radial loop.  Echo from 02/03/21:    1. Left ventricular ejection fraction, by estimation, is 65 to 70%. The  left ventricle has hyperdynamic function. The left ventricle has no  regional wall motion abnormalities. There is mild left ventricular  hypertrophy. Left ventricular diastolic  parameters are consistent with Grade I diastolic dysfunction (impaired  relaxation).   2. Right ventricular systolic function is normal. The right ventricular  size is normal. There is normal pulmonary artery systolic pressure. The  estimated right ventricular systolic pressure is 00.8 mmHg.   3. The mitral valve is normal in structure. No evidence of mitral valve  regurgitation. No evidence of mitral stenosis.   4. The aortic valve is tricuspid. Aortic valve regurgitation is mild.  Mild aortic valve sclerosis is present, with no evidence of aortic valve  stenosis.   5. The inferior vena cava is normal in size with greater than 50%  respiratory variability, suggesting right atrial pressure of 3 mmHg.    Laboratory Data:  High Sensitivity Troponin:   Recent Labs  Lab 07/15/22 1635 07/15/22 1835 07/16/22 0055 07/16/22 0322  TROPONINIHS '14 11 12 11     '$ Chemistry Recent Labs  Lab 07/15/22 1634 07/16/22 0055  NA 127* 133*  K 3.0* 3.5  CL 89* 98  CO2 24 26  GLUCOSE 120* 102*  BUN 37* 23  CREATININE 1.22* 0.84  CALCIUM 9.4 9.0  MG  --  1.9  GFRNONAA 45* >60  ANIONGAP 14 9    Recent Labs  Lab 07/15/22 1634 07/16/22 0055  PROT  7.5 5.9*  ALBUMIN 4.2 3.5  AST 17 15  ALT 13 11  ALKPHOS 56 44  BILITOT 0.6 0.5   Lipids No results for input(s): "CHOL", "TRIG", "HDL", "LABVLDL", "LDLCALC", "CHOLHDL" in the last 168 hours.  Hematology Recent Labs  Lab 07/15/22 1634 07/16/22 0055 07/16/22 0322  WBC 9.1 6.4 6.5  RBC 4.15 3.64* 3.86*  HGB 12.7 11.2* 11.8*  HCT 36.5 32.1* 34.0*  MCV 88.0 88.2 88.1  MCH 30.6 30.8 30.6  MCHC 34.8 34.9 34.7  RDW 13.4 13.3 13.6  PLT 467* 365 382   Thyroid No results for input(s): "TSH", "FREET4" in the last 168 hours.  BNPNo results for input(s): "BNP", "PROBNP" in the last 168 hours.  DDimer No results for input(s): "DDIMER" in the last 168 hours.   Radiology/Studies:  DG Chest Port 1 View  Result Date: 07/15/2022 CLINICAL DATA:  Chest pain EXAM: PORTABLE CHEST 1 VIEW COMPARISON:  Previous studies including the examination of 04/14/2021 FINDINGS: Cardiac size is within normal limits. Lung fields are clear of any infiltrates or pulmonary edema. There is no pleural effusion or pneumothorax. Skin folds are noted overlying the lateral aspect of lower lung fields. There is previous cardiac surgery. Degenerative changes are noted in both shoulders, much severe in the right shoulder. IMPRESSION: No active disease. Electronically Signed   By: Elmer Picker M.D.   On: 07/15/2022 16:45     Assessment and Plan:   Chest pain 3 vessel CAD with hx of CABG 2017  - Presented with few month onset of intermittent chest pain - Hs trop negative x4 - EKG nonspecific - Cardiac PET 07/05/2022 showed small defect with moderate reduction in uptake present in the apical to mid inferior location(s) that is reversible. There is abnormal wall motion in the defect area. Consistent with ischemia.  The defect is consistent with abnormal perfusion in the RCA territory. LVEF 61%.  - see cardiac cath report 02/03/21, challenging/unsuccessful angioplasty SVG to right PDA, she was recommended medical therapy -  we will escalate her medical therapy, increase metoprolol 25 mg PO BID add ranolazine 500 mg PO BID; she is having increased headache with the IMDUR so I will not increase in dose today; plavix continued at 75 mg PO daily.  I reviewed her PET MPI and her 2022 Cath with her; I am worried that the native RCA is too tortuous for safe intervention and that we would no longer be able to intervention on SVG to PDA; I will ask Dr. [place] to take a look at her cath; the patient notes that even if there was a high risk intervention she would be willing to try it due to the degree of angina; her goal is to be able to spend at least another 5 years of quality life with her second husband.  Can eat today Potentially NPO on Sunday for Monday LHC-Grafts R femoral If her anatomy is unsuitable for PCI will cancel cath     Risk Assessment/Risk Scores:     TIMI Risk Score for Unstable Angina or Non-ST Elevation MI:   The patient's TIMI risk score is 5, which indicates a 26% risk of all cause mortality, new or recurrent myocardial infarction or need for urgent revascularization in the next 14 days.   For questions or updates, please contact [facility] Please consult [email or web address] for contact info under    Rudean Haskell, MD [address]  [address] [place], [place] [address] [phone]  9:10 AM

## 2022-07-16 NOTE — Progress Notes (Signed)
After MN admission for chest pain. See H&P for full details.   PE: General: 81 y.o. female resting in chair in NAD Eyes: PERRL, normal sclera ENMT: Nares patent w/o discharge, orophaynx clear, dentition normal, ears w/o discharge/lesions/ulcers Neck: Supple, trachea midline Cardiovascular: RRR, +S1, S2, no m/g/r, equal pulses throughout Respiratory: CTABL, no w/r/r, normal WOB GI: BS+, NDNT, no masses noted, no organomegaly noted MSK: No e/c/c Neuro: A&O x 3, no focal deficits Psyc: Appropriate interaction and affect, calm/cooperative   A/P: Chest Pain CAD s/p CABG N/V AKI Hyponatremia Hypokalemia HTN Hypothyroidism HLD  - Cardiology has seen. Appreciate there assistance. Potential LHC for Monday. For now continuing as per cards recs: increased metoprolol and addition of ranolazine. Continue plavix. She says that her pain is stable. Her HypoNa+/K+ are improved. Trend. Remainder as per H&P   Jonnie Finner, DO

## 2022-07-17 ENCOUNTER — Encounter: Payer: Self-pay | Admitting: Cardiovascular Disease

## 2022-07-17 DIAGNOSIS — I200 Unstable angina: Secondary | ICD-10-CM | POA: Diagnosis not present

## 2022-07-17 MED ORDER — METOPROLOL TARTRATE 25 MG PO TABS
37.5000 mg | ORAL_TABLET | Freq: Two times a day (BID) | ORAL | Status: DC
  Administered 2022-07-17 – 2022-07-19 (×5): 37.5 mg via ORAL
  Filled 2022-07-17 (×6): qty 1

## 2022-07-17 MED ORDER — ACETAMINOPHEN 325 MG PO TABS
650.0000 mg | ORAL_TABLET | Freq: Four times a day (QID) | ORAL | Status: DC | PRN
  Administered 2022-07-17 – 2022-07-18 (×2): 650 mg via ORAL
  Filled 2022-07-17 (×2): qty 2

## 2022-07-17 MED ORDER — LEVOTHYROXINE SODIUM 50 MCG PO TABS
50.0000 ug | ORAL_TABLET | Freq: Every day | ORAL | Status: DC
  Administered 2022-07-18 – 2022-07-19 (×2): 50 ug via ORAL
  Filled 2022-07-17 (×2): qty 1

## 2022-07-17 NOTE — Care Management Obs Status (Signed)
[facility] NOTIFICATION   Patient Details  Name: Katherine Hall MRN: [id number] Date of Birth: May 14, 1941   Medicare Observation Status Notification Given:  Yes    Carles Collet, RN 07/17/2022, 7:50 AM

## 2022-07-17 NOTE — Progress Notes (Signed)
Progress Note  Patient Name: Katherine Hall Date of Encounter: 07/17/2022  Primary Cardiologist: Mertie Moores, MD   Subjective   Overnight chest pain has greatly improved but not resolved. Patient notes that has not gotten up and done a lot because of concerns of how she will do. No CP, SOB, Palpitations.  Has a HA related to her teeth but not the HA that she initially had with uptitration of Imdur from 30 mg to 60 mg.  Inpatient Medications    Scheduled Meds:  clopidogrel  75 mg Oral Daily   enoxaparin (LOVENOX) injection  30 mg Subcutaneous Daily   isosorbide mononitrate  60 mg Oral Daily   metoprolol tartrate  25 mg Oral BID   ranolazine  500 mg Oral BID   rOPINIRole  2 mg Oral QHS   Continuous Infusions:  PRN Meds: hydrALAZINE, melatonin, nitroGLYCERIN   Vital Signs    Vitals:   07/16/22 2300 07/17/22 0045 07/17/22 0506 07/17/22 0750  BP:  (!) 146/70 122/62 (!) 130/44  Pulse:  75 79 73  Resp:  '17 14 18  '$ Temp:  97.8 F (36.6 C) 97.9 F (36.6 C) 97.9 F (36.6 C)  TempSrc: Oral Oral Oral Oral  SpO2:  98% 99% 97%  Weight:   53.5 kg   Height:        Intake/Output Summary (Last 24 hours) at 07/17/2022 0856 Last data filed at 07/17/2022 4332 Gross per 24 hour  Intake 780 ml  Output --  Net 780 ml   Filed Weights   07/15/22 1634 07/15/22 2254 07/17/22 0506  Weight: 53.5 kg 53.5 kg 53.5 kg    Telemetry    SR - Personally Reviewed  Physical Exam   Gen: no distress, elderly female  Neck: No JVD Cardiac: No Rubs or Gallops, no murmur, RRR +2 radial pulses Respiratory: Clear to auscultation bilaterally, normal effort, normal  respiratory rate GI: Soft, nontender, non-distended  MS: No  edema;  moves all extremities Integument: Skin feels warm Neuro:  At time of evaluation, alert and oriented to person/place/time/situation  Psych: Normal affect, patient feels better   Labs    Chemistry Recent Labs  Lab 07/15/22 1634 07/16/22 0055  NA 127*  133*  K 3.0* 3.5  CL 89* 98  CO2 24 26  GLUCOSE 120* 102*  BUN 37* 23  CREATININE 1.22* 0.84  CALCIUM 9.4 9.0  PROT 7.5 5.9*  ALBUMIN 4.2 3.5  AST 17 15  ALT 13 11  ALKPHOS 56 44  BILITOT 0.6 0.5  GFRNONAA 45* >60  ANIONGAP 14 9     Hematology Recent Labs  Lab 07/15/22 1634 07/16/22 0055 07/16/22 0322  WBC 9.1 6.4 6.5  RBC 4.15 3.64* 3.86*  HGB 12.7 11.2* 11.8*  HCT 36.5 32.1* 34.0*  MCV 88.0 88.2 88.1  MCH 30.6 30.8 30.6  MCHC 34.8 34.9 34.7  RDW 13.4 13.3 13.6  PLT 467* 365 382    Cardiac EnzymesNo results for input(s): "TROPONINI" in the last 168 hours. No results for input(s): "TROPIPOC" in the last 168 hours.   BNPNo results for input(s): "BNP", "PROBNP" in the last 168 hours.   DDimer No results for input(s): "DDIMER" in the last 168 hours.   Radiology    DG Chest Port 1 View  Result Date: 07/15/2022 CLINICAL DATA:  Chest pain EXAM: PORTABLE CHEST 1 VIEW COMPARISON:  Previous studies including the examination of 04/14/2021 FINDINGS: Cardiac size is within normal limits. Lung fields are clear of any  infiltrates or pulmonary edema. There is no pleural effusion or pneumothorax. Skin folds are noted overlying the lateral aspect of lower lung fields. There is previous cardiac surgery. Degenerative changes are noted in both shoulders, much severe in the right shoulder. IMPRESSION: No active disease. Electronically Signed   By: Elmer Picker M.D.   On: 07/15/2022 16:45     Patient Profile     81 y.o. 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   - she will be NPO Sunday for potential LHC tomorrow: I will ask one of my colleagues to see her.  If her angina is not severe we will likely cancel cath given limited procedural options and plan for medical management; Monday is one of the only days a CTO operator is available next week so I have left her on the schedule just in case      For questions or updates, please contact [facility] Please consult [email or web address] for contact info under Cardiology/STEMI.      Rudean Haskell, MD [address], [address] [place], [place] [address] [phone]  8:56 AM

## 2022-07-17 NOTE — Progress Notes (Signed)
This encounter was created in error - please disregard.

## 2022-07-17 NOTE — Progress Notes (Signed)
PROGRESS NOTE    Katherine Hall  GEX:[id number] DOB: [birth year] DOA: 07/15/2022 PCP: Stacie Glaze, DO   Brief Narrative: 81 year old female with history of CABG, hypertension, hyperlipidemia, peripheral vascular disease and hypothyroidism with recent myocardial perfusion scan positive for ischemia in the inferior myocardium admitted with chest pain interfering with her activities of daily living.  She describes the pain as squeezing sensation on her chest like a rubber band radiating her heart.  Her Imdur dose was increased to 60 mg recently.  She is also having nausea and vomiting denies diarrhea or abdominal pain.  Seen by cardiology and planning possible cath on Monday. She also describes difficulty swallowing certain food denies any esophageal disorders and esophageal dilatation or stricture.  Assessment & Plan:   Principal Problem:   Unstable angina (HCC) Active Problems:   Hypothyroidism   Essential hypertension   Hyperlipidemia   Anemia, unspecified   Chest pain   S/P CABG x 5   Coronary artery disease involving native coronary artery of native heart with angina pectoris ([place])   Peripheral arterial disease ([place])   #1 chest pain/angina with recent abnormal myocardial perfusion scan concerning for reversible ischemia in the inferior.  Seen by cardiology.   Kept n.p.o. after midnight for possible catheter tomorrow Continue beta-blocker, Imdur, Lopressor Troponin 14 1112 and 11.  #2 nausea and vomiting resolved  #3 history of essential hypertension continue above meds  #4 hypothyroidism continue Synthroid.  Last TSH 0.9 from March 2022 will repeat TSH in a.m.  #5 hyperlipidemia on Repatha prior to admission Last LDL is 151 from 2020 Will repeat lipid profile tomorrow  #6 AKI resolved.  From dehydration nausea and vomiting was on lisinopril and HCTZ prior to admission which is on hold.  #7 mild hyponatremia sodium 133 from 127.  Continue to monitor and  trend.   Estimated body mass index is 26.44 kg/m as calculated from the following:   Height as of this encounter: '4\' 8"'$  (1.422 m).   Weight as of this encounter: 53.5 kg.  DVT prophylaxis: Lovenox Code Status: Full code Family Communication: None at bedside Disposition Plan:  Status is: Observation    Consultants:  Cardiology  Procedures: None Antimicrobials: None  Subjective: Patient is resting in bed currently she does not have any chest pain denies shortness of breath  Objective: Vitals:   07/17/22 0506 07/17/22 0750 07/17/22 1147 07/17/22 1158  BP: 122/62 (!) 130/44 (!) 85/51 (!) 123/46  Pulse: 79 73 62 61  Resp: '14 18 15 16  '$ Temp: 97.9 F (36.6 C) 97.9 F (36.6 C) 98.2 F (36.8 C)   TempSrc: Oral Oral Oral   SpO2: 99% 97% 98%   Weight: 53.5 kg     Height:        Intake/Output Summary (Last 24 hours) at 07/17/2022 1226 Last data filed at 07/17/2022 0951 Gross per 24 hour  Intake 780 ml  Output --  Net 780 ml   Filed Weights   07/15/22 1634 07/15/22 2254 07/17/22 0506  Weight: 53.5 kg 53.5 kg 53.5 kg    Examination:  General exam: Appears calm and comfortable  Respiratory system: Clear to auscultation. Respiratory effort normal. Cardiovascular system: S1 & S2 heard, RRR. No JVD, murmurs, rubs, gallops or clicks. No pedal edema. Gastrointestinal system: Abdomen is nondistended, soft and nontender. No organomegaly or masses felt. Normal bowel sounds heard. Central nervous system: Alert and oriented. No focal neurological deficits. Extremities: No edema Skin: No rashes, lesions or ulcers Psychiatry: Judgement and  insight appear normal. Mood & affect appropriate.     Data Reviewed: I have personally reviewed following labs and imaging studies  CBC: Recent Labs  Lab 07/15/22 1634 07/16/22 0055 07/16/22 0322  WBC 9.1 6.4 6.5  NEUTROABS 5.3 3.5  --   HGB 12.7 11.2* 11.8*  HCT 36.5 32.1* 34.0*  MCV 88.0 88.2 88.1  PLT 467* 365 811   Basic  Metabolic Panel: Recent Labs  Lab 07/15/22 1634 07/16/22 0055  NA 127* 133*  K 3.0* 3.5  CL 89* 98  CO2 24 26  GLUCOSE 120* 102*  BUN 37* 23  CREATININE 1.22* 0.84  CALCIUM 9.4 9.0  MG  --  1.9   GFR: Estimated Creatinine Clearance: 35.8 mL/min (by C-G formula based on SCr of 0.84 mg/dL). Liver Function Tests: Recent Labs  Lab 07/15/22 1634 07/16/22 0055  AST 17 15  ALT 13 11  ALKPHOS 56 44  BILITOT 0.6 0.5  PROT 7.5 5.9*  ALBUMIN 4.2 3.5   No results for input(s): "LIPASE", "AMYLASE" in the last 168 hours. No results for input(s): "AMMONIA" in the last 168 hours. Coagulation Profile: No results for input(s): "INR", "PROTIME" in the last 168 hours. Cardiac Enzymes: No results for input(s): "CKTOTAL", "CKMB", "CKMBINDEX", "TROPONINI" in the last 168 hours. BNP (last 3 results) No results for input(s): "PROBNP" in the last 8760 hours. HbA1C: No results for input(s): "HGBA1C" in the last 72 hours. CBG: No results for input(s): "GLUCAP" in the last 168 hours. Lipid Profile: No results for input(s): "CHOL", "HDL", "LDLCALC", "TRIG", "CHOLHDL", "LDLDIRECT" in the last 72 hours. Thyroid Function Tests: No results for input(s): "TSH", "T4TOTAL", "FREET4", "T3FREE", "THYROIDAB" in the last 72 hours. Anemia Panel: No results for input(s): "VITAMINB12", "FOLATE", "FERRITIN", "TIBC", "IRON", "RETICCTPCT" in the last 72 hours. Sepsis Labs: No results for input(s): "PROCALCITON", "LATICACIDVEN" in the last 168 hours.  Recent Results (from the past 240 hour(s))  SARS Coronavirus 2 by RT PCR (hospital order, performed in [facility] hospital lab) *cepheid single result test* Anterior Nasal Swab     Status: None   Collection Time: 07/15/22  5:19 PM   Specimen: Anterior Nasal Swab  Result Value Ref Range Status   SARS Coronavirus 2 by RT PCR NEGATIVE NEGATIVE Final    Comment: (NOTE) SARS-CoV-2 target nucleic acids are NOT DETECTED.  The SARS-CoV-2 RNA is generally detectable  in upper and lower respiratory specimens during the acute phase of infection. The lowest concentration of SARS-CoV-2 viral copies this assay can detect is 250 copies / mL. A negative result does not preclude SARS-CoV-2 infection and should not be used as the sole basis for treatment or other patient management decisions.  A negative result may occur with improper specimen collection / handling, submission of specimen other than nasopharyngeal swab, presence of viral mutation(s) within the areas targeted by this assay, and inadequate number of viral copies (<250 copies / mL). A negative result must be combined with clinical observations, patient history, and epidemiological information.  Fact Sheet for Patients:   https://www.patel.info/  Fact Sheet for Healthcare Providers: https://hall.com/  This test is not yet approved or  cleared by the [place] FDA and has been authorized for detection and/or diagnosis of SARS-CoV-2 by FDA under an Emergency Use Authorization (EUA).  This EUA will remain in effect (meaning this test can be used) for the duration of the COVID-19 declaration under Section 564(b)(1) of the Act, 21 U.S.C. section 360bbb-3(b)(1), unless the authorization is terminated or revoked sooner.  Performed at [facility], [address] [place],  [address]          Radiology Studies: DG Chest [place] 1 View  Result Date: 07/15/2022 CLINICAL DATA:  Chest pain EXAM: PORTABLE CHEST 1 VIEW COMPARISON:  Previous studies including the examination of 04/14/2021 FINDINGS: Cardiac size is within normal limits. Lung fields are clear of any infiltrates or pulmonary edema. There is no pleural effusion or pneumothorax. Skin folds are noted overlying the lateral aspect of lower lung fields. There is previous cardiac surgery. Degenerative changes are noted in both shoulders, much severe in the right shoulder. IMPRESSION:  No active disease. Electronically Signed   By: Elmer Picker M.D.   On: 07/15/2022 16:45        Scheduled Meds:  clopidogrel  75 mg Oral Daily   enoxaparin (LOVENOX) injection  30 mg Subcutaneous Daily   isosorbide mononitrate  60 mg Oral Daily   [START ON 07/18/2022] levothyroxine  50 mcg Oral QAC breakfast   metoprolol tartrate  37.5 mg Oral BID   ranolazine  500 mg Oral BID   rOPINIRole  2 mg Oral QHS   Continuous Infusions:   LOS: 0 days    Time spent: 39 min  Georgette Shell, MD  07/17/2022, 12:26 PM

## 2022-07-18 ENCOUNTER — Encounter (HOSPITAL_COMMUNITY)
Admission: EM | Disposition: A | Discharge status: Home / Self Care | Payer: Self-pay | Source: Home / Self Care | Attending: Emergency Medicine

## 2022-07-18 ENCOUNTER — Encounter: Payer: Medicare Other | Admitting: Cardiovascular Disease

## 2022-07-18 DIAGNOSIS — I25110 Atherosclerotic heart disease of native coronary artery with unstable angina pectoris: Secondary | ICD-10-CM

## 2022-07-18 DIAGNOSIS — I200 Unstable angina: Secondary | ICD-10-CM | POA: Diagnosis not present

## 2022-07-18 DIAGNOSIS — I25710 Atherosclerosis of autologous vein coronary artery bypass graft(s) with unstable angina pectoris: Secondary | ICD-10-CM | POA: Diagnosis not present

## 2022-07-18 HISTORY — PX: LEFT HEART CATH AND CORS/GRAFTS ANGIOGRAPHY: CATH118250

## 2022-07-18 LAB — LIPID PANEL
Cholesterol: 262 mg/dL — ABNORMAL HIGH (ref 0–200)
HDL: 53 mg/dL (ref >40)
LDL Cholesterol: 180 mg/dL — ABNORMAL HIGH (ref 0–99)
Total CHOL/HDL Ratio: 4.9 RATIO
Triglycerides: 146 mg/dL (ref <150)
VLDL: 29 mg/dL (ref 0–40)

## 2022-07-18 LAB — COMPREHENSIVE METABOLIC PANEL
ALT: 10 U/L (ref 0–44)
AST: 15 U/L (ref 15–41)
Albumin: 3.4 g/dL — ABNORMAL LOW (ref 3.5–5.0)
Alkaline Phosphatase: 44 U/L (ref 38–126)
Anion gap: 11 (ref 5–15)
BUN: 8 mg/dL (ref 8–23)
CO2: 25 mmol/L (ref 22–32)
Calcium: 9.1 mg/dL (ref 8.9–10.3)
Chloride: 92 mmol/L — ABNORMAL LOW (ref 98–111)
Creatinine, Ser: 0.76 mg/dL (ref 0.44–1.00)
GFR, Estimated: >60 mL/min (ref >60)
Glucose, Bld: 105 mg/dL — ABNORMAL HIGH (ref 70–99)
Potassium: 4 mmol/L (ref 3.5–5.1)
Sodium: 128 mmol/L — ABNORMAL LOW (ref 135–145)
Total Bilirubin: 0.6 mg/dL (ref 0.3–1.2)
Total Protein: 5.6 g/dL — ABNORMAL LOW (ref 6.5–8.1)

## 2022-07-18 LAB — CBC
HCT: 32.4 % — ABNORMAL LOW (ref 36.0–46.0)
Hemoglobin: 11.2 g/dL — ABNORMAL LOW (ref 12.0–15.0)
MCH: 30.4 pg (ref 26.0–34.0)
MCHC: 34.6 g/dL (ref 30.0–36.0)
MCV: 88 fL (ref 80.0–100.0)
Platelets: 361 10*3/uL (ref 150–400)
RBC: 3.68 MIL/uL — ABNORMAL LOW (ref 3.87–5.11)
RDW: 13.3 % (ref 11.5–15.5)
WBC: 7.6 10*3/uL (ref 4.0–10.5)
nRBC: 0 % (ref 0.0–0.2)

## 2022-07-18 LAB — TSH: TSH: 0.921 u[IU]/mL (ref 0.350–4.500)

## 2022-07-18 SURGERY — LEFT HEART CATH AND CORS/GRAFTS ANGIOGRAPHY
Anesthesia: LOCAL

## 2022-07-18 MED ORDER — SODIUM CHLORIDE 0.9 % IV SOLN: 250.0000 mL | INTRAVENOUS | Status: DC | PRN

## 2022-07-18 MED ORDER — SODIUM CHLORIDE 0.9% FLUSH
3.0000 mL | INTRAVENOUS | Status: DC | PRN
Start: 2022-07-18 — End: 2022-07-19

## 2022-07-18 MED ORDER — SODIUM CHLORIDE 0.9 % WEIGHT BASED INFUSION
1.0000 mL/kg/h | INTRAVENOUS | Status: AC
  Administered 2022-07-18: 1 mL/kg/h via INTRAVENOUS

## 2022-07-18 MED ORDER — LIDOCAINE HCL (PF) 1 % IJ SOLN
INTRAMUSCULAR | Status: AC
  Filled 2022-07-18: qty 30

## 2022-07-18 MED ORDER — PANTOPRAZOLE SODIUM 40 MG PO TBEC
40.0000 mg | DELAYED_RELEASE_TABLET | Freq: Every day | ORAL | Status: DC
  Administered 2022-07-18 – 2022-07-19 (×2): 40 mg via ORAL
  Filled 2022-07-18 (×2): qty 1

## 2022-07-18 MED ORDER — SODIUM CHLORIDE 0.9% FLUSH
3.0000 mL | Freq: Two times a day (BID) | INTRAVENOUS | Status: DC
Start: 2022-07-18 — End: 2022-07-19
  Administered 2022-07-18 – 2022-07-19 (×2): 3 mL via INTRAVENOUS

## 2022-07-18 MED ORDER — SODIUM CHLORIDE 0.9 % IV SOLN
INTRAVENOUS | Status: AC | PRN
  Administered 2022-07-18: 250 mL via INTRAVENOUS

## 2022-07-18 MED ORDER — HEPARIN (PORCINE) IN NACL 1000-0.9 UT/500ML-% IV SOLN
INTRAVENOUS | Status: DC | PRN
  Administered 2022-07-18 (×2): 500 mL

## 2022-07-18 MED ORDER — ASPIRIN 81 MG PO CHEW
81.0000 mg | CHEWABLE_TABLET | ORAL | Status: AC
  Administered 2022-07-18: 81 mg via ORAL
  Filled 2022-07-18: qty 1

## 2022-07-18 MED ORDER — SODIUM CHLORIDE 0.9 % WEIGHT BASED INFUSION: 1.0000 mL/kg/h | INTRAVENOUS | Status: DC

## 2022-07-18 MED ORDER — IOHEXOL 350 MG/ML SOLN
INTRAVENOUS | Status: DC | PRN
  Administered 2022-07-18: 70 mL

## 2022-07-18 MED ORDER — ENOXAPARIN SODIUM 40 MG/0.4ML IJ SOSY
40.0000 mg | PREFILLED_SYRINGE | INTRAMUSCULAR | Status: DC
  Administered 2022-07-19: 40 mg via SUBCUTANEOUS
  Filled 2022-07-18: qty 0.4

## 2022-07-18 MED ORDER — SODIUM CHLORIDE 0.9 % WEIGHT BASED INFUSION
3.0000 mL/kg/h | INTRAVENOUS | Status: AC
  Administered 2022-07-18: 3 mL/kg/h via INTRAVENOUS

## 2022-07-18 MED ORDER — FENTANYL CITRATE (PF) 100 MCG/2ML IJ SOLN
INTRAMUSCULAR | Status: AC
  Filled 2022-07-18: qty 2

## 2022-07-18 MED ORDER — LIDOCAINE HCL (PF) 1 % IJ SOLN
INTRAMUSCULAR | Status: DC | PRN
  Administered 2022-07-18: 10 mL

## 2022-07-18 MED ORDER — MIDAZOLAM HCL 2 MG/2ML IJ SOLN
INTRAMUSCULAR | Status: AC
  Filled 2022-07-18: qty 2

## 2022-07-18 MED ORDER — MIDAZOLAM HCL 2 MG/2ML IJ SOLN
INTRAMUSCULAR | Status: DC | PRN
  Administered 2022-07-18: 1 mg via INTRAVENOUS

## 2022-07-18 MED ORDER — HEPARIN (PORCINE) IN NACL 1000-0.9 UT/500ML-% IV SOLN
INTRAVENOUS | Status: AC
  Filled 2022-07-18: qty 1000

## 2022-07-18 MED ORDER — FENTANYL CITRATE (PF) 100 MCG/2ML IJ SOLN
INTRAMUSCULAR | Status: DC | PRN
  Administered 2022-07-18: 25 ug via INTRAVENOUS

## 2022-07-18 SURGICAL SUPPLY — 14 items
CATH EXPO 5F MPA-1 (CATHETERS) ×1 IMPLANT
CATH INFINITI 5 FR IM (CATHETERS) ×1 IMPLANT
CATH INFINITI 5FR AL1 (CATHETERS) ×1 IMPLANT
CATH INFINITI 5FR MULTPACK ANG (CATHETERS) ×1 IMPLANT
KIT HEART LEFT (KITS) ×2 IMPLANT
PACK CARDIAC CATHETERIZATION (CUSTOM PROCEDURE TRAY) ×2 IMPLANT
SHEATH PINNACLE 5F 10CM (SHEATH) ×1 IMPLANT
SHEATH PROBE COVER 6X72 (BAG) ×1 IMPLANT
SYR MEDRAD MARK 7 150ML (SYRINGE) ×2 IMPLANT
TRANSDUCER W/STOPCOCK (MISCELLANEOUS) ×2 IMPLANT
TUBING CIL FLEX 10 FLL-RA (TUBING) ×2 IMPLANT
WIRE EMERALD 3MM-J .035X150CM (WIRE) ×1 IMPLANT
WIRE EMERALD 3MM-J .035X260CM (WIRE) ×1 IMPLANT
WIRE HI TORQ VERSACORE-J 145CM (WIRE) ×1 IMPLANT

## 2022-07-18 NOTE — Progress Notes (Signed)
PROGRESS NOTE    Katherine Hall  YQI:[id number] DOB: [birth year] DOA: 07/15/2022 PCP: Stacie Glaze, DO   Brief Narrative: 81 year old female with history of CABG, hypertension, hyperlipidemia, peripheral vascular disease and hypothyroidism with recent myocardial perfusion scan positive for ischemia in the inferior myocardium admitted with chest pain interfering with her activities of daily living.  She describes the pain as squeezing sensation on her chest like a rubber band radiating her heart.  Her Imdur dose was increased to 60 mg recently.  She is also having nausea and vomiting denies diarrhea or abdominal pain.  Seen by cardiology and planning possible cath on Monday. She also describes difficulty swallowing certain food denies any esophageal disorders and esophageal dilatation or stricture.  Assessment & Plan:   Principal Problem:   Unstable angina (HCC) Active Problems:   Hypothyroidism   Essential hypertension   Hyperlipidemia   Anemia, unspecified   Chest pain   S/P CABG x 5   Coronary artery disease involving native coronary artery of native heart with angina pectoris ([place])   Peripheral arterial disease ([place])   #1 chest pain/angina with recent abnormal myocardial perfusion scan concerning for reversible ischemia in the inferior.  Seen by cardiology.   Cath 07/18/2022 with three-vessel obstructive disease in left main disease, patent LIMA to LAD, patent SVG to the first diagonal, occluded SVG to PDA, low left ventricular EDP, recommending medical management. Continue beta-blocker, Imdur, Lopressor Troponin 14 1112 and 11.  #2 nausea and vomiting resolved  #3 history of essential hypertension continue above meds  #4 hypothyroidism continue Synthroid.  Last TSH 0.9 from March 2022 will repeat TSH in a.m.  #5 hyperlipidemia on Repatha prior to admission Last LDL is 151 from 2020 Will repeat lipid profile tomorrow  #6 AKI resolved.  From dehydration nausea and vomiting  was on lisinopril and HCTZ prior to admission which is on hold.  #7 mild hyponatremia sodium 133 from 127.  Continue to monitor and trend.   Estimated body mass index is 26.54 kg/m as calculated from the following:   Height as of this encounter: '4\' 8"'$  (1.422 m).   Weight as of this encounter: 53.7 kg.  DVT prophylaxis: Lovenox Code Status: Full code Family Communication: None at bedside Disposition Plan:  Status is: Observation    Consultants:  Cardiology  Procedures: None Antimicrobials: None  Subjective:  Denies any chest pain reports she is nauseous Objective: Vitals:   07/18/22 1329 07/18/22 1330 07/18/22 1332 07/18/22 1335  BP: (!) 100/44  (!) 100/44 (!) 100/44  Pulse: 62 64 62 60  Resp: '16 16 17 15  '$ Temp:      TempSrc:      SpO2: 97% 95% 96% 95%  Weight:      Height:        Intake/Output Summary (Last 24 hours) at 07/18/2022 1426 Last data filed at 07/18/2022 0400 Gross per 24 hour  Intake 180 ml  Output 900 ml  Net -720 ml    Filed Weights   07/15/22 2254 07/17/22 0506 07/18/22 0124  Weight: 53.5 kg 53.5 kg 53.7 kg    Examination:  General exam: Appears  in nad Respiratory system: Clear to auscultation. Respiratory effort normal. Cardiovascular system: S1 & S2 heard, RRR. No JVD, murmurs, rubs, gallops or clicks. No pedal edema. Gastrointestinal system: Abdomen is nondistended, soft and nontender. No organomegaly or masses felt. Normal bowel sounds heard. Central nervous system: Alert and oriented. No focal neurological deficits. Extremities: No edema Skin: No rashes, lesions  or ulcers Psychiatry: Judgement and insight appear normal. Mood & affect appropriate.     Data Reviewed: I have personally reviewed following labs and imaging studies  CBC: Recent Labs  Lab 07/15/22 1634 07/16/22 0055 07/16/22 0322 07/18/22 0553  WBC 9.1 6.4 6.5 7.6  NEUTROABS 5.3 3.5  --   --   HGB 12.7 11.2* 11.8* 11.2*  HCT 36.5 32.1* 34.0* 32.4*  MCV 88.0 88.2  88.1 88.0  PLT 467* 365 382 213    Basic Metabolic Panel: Recent Labs  Lab 07/15/22 1634 07/16/22 0055 07/18/22 0553  NA 127* 133* 128*  K 3.0* 3.5 4.0  CL 89* 98 92*  CO2 '24 26 25  '$ GLUCOSE 120* 102* 105*  BUN 37* 23 8  CREATININE 1.22* 0.84 0.76  CALCIUM 9.4 9.0 9.1  MG  --  1.9  --     GFR: Estimated Creatinine Clearance: 37.7 mL/min (by C-G formula based on SCr of 0.76 mg/dL). Liver Function Tests: Recent Labs  Lab 07/15/22 1634 07/16/22 0055 07/18/22 0553  AST '17 15 15  '$ ALT '13 11 10  '$ ALKPHOS 56 44 44  BILITOT 0.6 0.5 0.6  PROT 7.5 5.9* 5.6*  ALBUMIN 4.2 3.5 3.4*    No results for input(s): "LIPASE", "AMYLASE" in the last 168 hours. No results for input(s): "AMMONIA" in the last 168 hours. Coagulation Profile: No results for input(s): "INR", "PROTIME" in the last 168 hours. Cardiac Enzymes: No results for input(s): "CKTOTAL", "CKMB", "CKMBINDEX", "TROPONINI" in the last 168 hours. BNP (last 3 results) No results for input(s): "PROBNP" in the last 8760 hours. HbA1C: No results for input(s): "HGBA1C" in the last 72 hours. CBG: No results for input(s): "GLUCAP" in the last 168 hours. Lipid Profile: Recent Labs    07/18/22 0553  CHOL 262*  HDL 53  LDLCALC 180*  TRIG 146  CHOLHDL 4.9   Thyroid Function Tests: Recent Labs    07/18/22 0553  TSH 0.921   Anemia Panel: No results for input(s): "VITAMINB12", "FOLATE", "FERRITIN", "TIBC", "IRON", "RETICCTPCT" in the last 72 hours. Sepsis Labs: No results for input(s): "PROCALCITON", "LATICACIDVEN" in the last 168 hours.  Recent Results (from the past 240 hour(s))  SARS Coronavirus 2 by RT PCR (hospital order, performed in [facility] hospital lab) *cepheid single result test* Anterior Nasal Swab     Status: None   Collection Time: 07/15/22  5:19 PM   Specimen: Anterior Nasal Swab  Result Value Ref Range Status   SARS Coronavirus 2 by RT PCR NEGATIVE NEGATIVE Final    Comment: (NOTE) SARS-CoV-2  target nucleic acids are NOT DETECTED.  The SARS-CoV-2 RNA is generally detectable in upper and lower respiratory specimens during the acute phase of infection. The lowest concentration of SARS-CoV-2 viral copies this assay can detect is 250 copies / mL. A negative result does not preclude SARS-CoV-2 infection and should not be used as the sole basis for treatment or other patient management decisions.  A negative result may occur with improper specimen collection / handling, submission of specimen other than nasopharyngeal swab, presence of viral mutation(s) within the areas targeted by this assay, and inadequate number of viral copies (<250 copies / mL). A negative result must be combined with clinical observations, patient history, and epidemiological information.  Fact Sheet for Patients:   https://www.patel.info/  Fact Sheet for Healthcare Providers: https://hall.com/  This test is not yet approved or  cleared by the [place] FDA and has been authorized for detection and/or diagnosis of SARS-CoV-2  by FDA under an Emergency Use Authorization (EUA).  This EUA will remain in effect (meaning this test can be used) for the duration of the COVID-19 declaration under Section 564(b)(1) of the Act, 21 U.S.C. section 360bbb-3(b)(1), unless the authorization is terminated or revoked sooner.  Performed at [facility], [address] [place], [place] [address]          Radiology Studies: CARDIAC CATHETERIZATION  Result Date: 07/18/2022   RPDA lesion is 100% stenosed.   Prox LAD to Mid LAD lesion is 100% stenosed.   Mid Cx lesion is 100% stenosed.   Prox RCA lesion is 40% stenosed.   Prox RCA to Mid RCA lesion is 70% stenosed.   Dist RCA lesion is 60% stenosed.   1st Mrg lesion is 100% stenosed.   RPAV lesion is 90% stenosed.   Origin to Prox Graft lesion is 100% stenosed.   Ost LM to Mid LM lesion is 90% stenosed.   Insertion  lesion is 40% stenosed.   SVG and is normal in caliber.   SVG.   LIMA and is normal in caliber.   The graft exhibits no disease.   The graft exhibits no disease.   LV end diastolic pressure is normal. 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 Patent LIMA to the LAD Patent SVG to the first diagonal Patent sequential SVG to OM1 and OM2 Occluded SVG to the PDA Low LV EDP Plan: the SVG to PDA is known to be occluded. Otherwise no new findings compared with cardiac cath in March 2022. The other grafts are patent. The RCA has diffuse moderate to severe disease and is extremely tortuous. It is not a viable target for PCI. Would continue medical therapy.        Scheduled Meds:  [MAR Hold] clopidogrel  75 mg Oral Daily   [MAR Hold] enoxaparin (LOVENOX) injection  30 mg Subcutaneous Daily   [MAR Hold] isosorbide mononitrate  60 mg Oral Daily   [MAR Hold] levothyroxine  50 mcg Oral QAC breakfast   [MAR Hold] metoprolol tartrate  37.5 mg Oral BID   [MAR Hold] ranolazine  500 mg Oral BID   [MAR Hold] rOPINIRole  2 mg Oral QHS   Continuous Infusions:  sodium chloride 250 mL (07/18/22 1233)   sodium chloride 1 mL/kg/hr (07/18/22 0755)     LOS: 0 days    Time spent: 39 min  Georgette Shell, MD  07/18/2022, 2:26 PM

## 2022-07-18 NOTE — Progress Notes (Signed)
Right groin femoral arterial sheath pulled. Manual pressure held x20 minutes. Right groin site level 0 pre and post sheath pull. Site dressed with gauze and tegaderm. Bed rest starts at 1440. Instructions reviewed with patient. Right DP pulse dopplered pre and post sheath pull.

## 2022-07-18 NOTE — Progress Notes (Signed)
TR[facility] night cross cover note:   I was notified by RN of patient's headache and associated request for prn acetaminophen, which I subsequently ordered.     Babs Bertin, DO Hospitalist

## 2022-07-18 NOTE — Interval H&P Note (Signed)
History and Physical Interval Note:  07/18/2022 12:20 PM  Katherine Hall  has presented today for surgery, with the diagnosis of chest pain.  The various methods of treatment have been discussed with the patient and family. After consideration of risks, benefits and other options for treatment, the patient has consented to  Procedure(s): LEFT HEART CATH AND CORS/GRAFTS ANGIOGRAPHY (N/A) as a surgical intervention.  The patient's history has been reviewed, patient examined, no change in status, stable for surgery.  I have reviewed the patient's chart and labs.  Questions were answered to the patient's satisfaction.   Cath Lab Visit (complete for each Cath Lab visit)  Clinical Evaluation Leading to the Procedure:   ACS: Yes.    Non-ACS:    Anginal Classification: CCS IV  Anti-ischemic medical therapy: Maximal Therapy (2 or more classes of medications)  Non-Invasive Test Results: No non-invasive testing performed  Prior CABG: Previous CABG        [facility] 07/18/2022 12:21 PM

## 2022-07-18 NOTE — H&P (View-Only) (Signed)
Progress Note  Patient Name: Katherine Hall Date of Encounter: 07/18/2022  [facility] Cardiologist: Mertie Moores, MD   Subjective   No chest pain or shortness of breath this morning.  Lengthy discussion about her progressive symptoms over the last few months.  Inpatient Medications    Scheduled Meds:  clopidogrel  75 mg Oral Daily   enoxaparin (LOVENOX) injection  30 mg Subcutaneous Daily   isosorbide mononitrate  60 mg Oral Daily   levothyroxine  50 mcg Oral QAC breakfast   metoprolol tartrate  37.5 mg Oral BID   ranolazine  500 mg Oral BID   rOPINIRole  2 mg Oral QHS   Continuous Infusions:  sodium chloride 1 mL/kg/hr (07/18/22 0755)   PRN Meds: acetaminophen, hydrALAZINE, melatonin, nitroGLYCERIN   Vital Signs    Vitals:   07/17/22 2038 07/18/22 0124 07/18/22 0442 07/18/22 0736  BP: (!) 151/70 (!) 148/72 (!) 146/60 (!) 141/64  Pulse: 70 70 73 63  Resp: '18 18 18 16  '$ Temp: 98 F (36.7 C) (!) 97.3 F (36.3 C) 97.7 F (36.5 C) 97.6 F (36.4 C)  TempSrc: Oral Oral Oral Oral  SpO2: 97%  97% 99%  Weight:  53.7 kg    Height:        Intake/Output Summary (Last 24 hours) at 07/18/2022 0954 Last data filed at 07/18/2022 0400 Gross per 24 hour  Intake 240 ml  Output 900 ml  Net -660 ml      07/18/2022    1:24 AM 07/17/2022    5:06 AM 07/15/2022   10:54 PM  Last 3 Weights  Weight (lbs) 118 lb 6.4 oz 117 lb 15.1 oz 117 lb 15.1 oz  Weight (kg) 53.706 kg 53.5 kg 53.5 kg      Telemetry    Sinus rhythm- Personally Reviewed  ECG    Sinus rhythm with age-indeterminate inferolateral infarct- Personally Reviewed  Physical Exam  Pleasant, alert and oriented, elderly woman in no distress GEN: No acute distress.   Neck: No JVD Cardiac: RRR, no murmurs, rubs, or gallops.  Respiratory: Clear to auscultation bilaterally. GI: Soft, nontender, non-distended  MS: No edema; No deformity. Neuro:  Nonfocal  Psych: Normal affect   Labs    High Sensitivity  Troponin:   Recent Labs  Lab 07/15/22 1635 07/15/22 1835 07/16/22 0055 07/16/22 0322  TROPONINIHS '14 11 12 11     '$ Chemistry Recent Labs  Lab 07/15/22 1634 07/16/22 0055 07/18/22 0553  NA 127* 133* 128*  K 3.0* 3.5 4.0  CL 89* 98 92*  CO2 '24 26 25  '$ GLUCOSE 120* 102* 105*  BUN 37* 23 8  CREATININE 1.22* 0.84 0.76  CALCIUM 9.4 9.0 9.1  MG  --  1.9  --   PROT 7.5 5.9* 5.6*  ALBUMIN 4.2 3.5 3.4*  AST '17 15 15  '$ ALT '13 11 10  '$ ALKPHOS 56 44 44  BILITOT 0.6 0.5 0.6  GFRNONAA 45* >60 >60  ANIONGAP '14 9 11    '$ Lipids  Recent Labs  Lab 07/18/22 0553  CHOL 262*  TRIG 146  HDL 53  LDLCALC 180*  CHOLHDL 4.9    Hematology Recent Labs  Lab 07/16/22 0055 07/16/22 0322 07/18/22 0553  WBC 6.4 6.5 7.6  RBC 3.64* 3.86* 3.68*  HGB 11.2* 11.8* 11.2*  HCT 32.1* 34.0* 32.4*  MCV 88.2 88.1 88.0  MCH 30.8 30.6 30.4  MCHC 34.9 34.7 34.6  RDW 13.3 13.6 13.3  PLT 365 382 361   Thyroid  Recent Labs  Lab 07/18/22 0553  TSH 0.921    BNPNo results for input(s): "BNP", "PROBNP" in the last 168 hours.  DDimer No results for input(s): "DDIMER" in the last 168 hours.   Radiology    No results found.  Cardiac Studies   Cardiac PET/CT:   LV perfusion is abnormal. There is evidence of ischemia. There is no evidence of infarction. Defect 1: There is a small defect with moderate reduction in uptake present in the apical to mid inferior location(s) that is reversible. There is abnormal wall motion in the defect area. Consistent with ischemia. The defect is consistent with abnormal perfusion in the RCA territory.   Rest left ventricular function is normal. Rest EF: 61 %. Stress left ventricular function is normal. Stress EF: 66 %. End diastolic cavity size is normal.   Myocardial blood flow was computed to be 1.16m/g/min at rest and 1.843mg/min at stress. Global myocardial blood flow reserve was 1.61 and was abnormal. Myocardial blood flow reserve was abnormal in the RCA distribution  (1.39) consistent with the ischemia seen on the perfusion imaging. The myocardial blood flow was mildly abnormal in the LAD/LCX distributions. Due to prior CABG, MBF flow values are not expected to be normal. Quite abnormal in the RCA distribution which matches the perfusion defect.   Myocardial blood flow was computed to be 1.1655m/min at rest and 1.52m8mmin at stress. Global myocardial blood flow reserve was 1.61 and was abnormal.   Coronary calcium assessment not performed due to prior revascularization.   Findings are consistent with ischemia. The study is intermediate risk.  Cath 02/03/21: 1.  Severe underlying three-vessel coronary artery disease with patent grafts including LIMA to LAD, SVG to OM1/OM 3, SVG to diagonal with 60% anastomosis stenosis and SVG to right PDA.  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 2.  Left ventricular angiography was not performed.  EF was normal by echo.  Normal LVEDP. 3.  Unsuccessful attempted angioplasty to SVG to right PDA due to inability to cross the stenosis beyond the aneurysmal segment.  There was loss of flow and acute closure with wire manipulation.  Multiple CTO wires were used but with no success.  The patient developed mild inferior ST elevation on the monitor with throat discomfort.  Symptoms improved with nitroglycerin drip.   Recommendations: The patient will have an inferior infarct although the right PDA distribution is relatively small.  The native RCA is diffusely diseased and tortuous and likely not suitable for PCI. I elected to place the patient on nitroglycerin drip and will continue medical therapy for her coronary artery disease.   The procedure was performed via the right common femoral artery after unsuccessful access via the left radial artery due to suspected radial loop.  Patient Profile     81 y41. female with  extensive CAD presenting with symptoms of unstable angina after an abnormal cardiac PET/CT demonstrated inferior ischemia  Assessment & Plan    1.  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  Acute kidney injury, resolved 3.  Hyponatremia, stable  Prior cath films reviewed.  Discussed findings of recent noninvasive testing with the patient.  Her history suggest progressive symptoms over the past few months with some typical and atypical features but certainly concerning for unstable angina/progressive bypass graft disease.  While her ischemic territory is in the inferior wall, it is  possible that she has progressive disease in the bypass graft of the circumflex distribution as well.  At the time of her previous cardiac catheterization procedure, she had patency of the sequential graft to the obtuse marginal and posterolateral branches.  She also had a patent vein graft to the diagonal branch with moderate stenosis at the distal anastomosis.  Considering the time since her last study, clear progression of symptoms, and risk for further bypass graft disease, I think it is appropriate to proceed with cardiac catheterization and possible PCI. I have reviewed the risks, indications, and alternatives to cardiac catheterization, possible angioplasty, and stenting with the patient. Risks include but are not limited to bleeding, infection, vascular injury, stroke, myocardial infection, arrhythmia, kidney injury, radiation-related injury in the case of prolonged fluoroscopy use, emergency cardiac surgery, and death. The patient understands the risks of serious complication is 1-2 in 2919 with diagnostic cardiac cath and 1-2% or less with angioplasty/stenting.  The patient has been compliant with aspirin and clopidogrel.  If her coronary anatomy is otherwise stable, I would recommend treating her RCA medically as I think PCI risk is likely prohibitive.  Discussed her case with Dr. Martinique who  will do her procedure and we are in agreement.  Full informed consent is obtained.      For questions or updates, please contact [place] Please consult [email or web address] for contact info under        Signed, Sherren Mocha, MD  07/18/2022, 9:54 AM

## 2022-07-18 NOTE — TOC Progression Note (Signed)
Transition of Care [facility]) - Progression Note    Patient Details  Name: Katherine Hall MRN: [id number] Date of Birth: Nov 16, 1941  [facility] CM/SW Contact  Zenon Mayo, RN Phone Number: 07/18/2022, 3:45 PM  Clinical Narrative:    From home with spouse, presents with chest pain, has had a CABG in the past. For heart cath today.  TOC following.         Expected Discharge Plan and Services                                                 Social Determinants of Health (SDOH) Interventions    Readmission Risk Interventions     No data to display

## 2022-07-18 NOTE — Plan of Care (Signed)
  Problem: Nutrition: Goal: Adequate nutrition will be maintained Outcome: Completed/Met   Problem: Elimination: Goal: Will not experience complications related to bowel motility Outcome: Completed/Met Goal: Will not experience complications related to urinary retention Outcome: Completed/Met   Problem: Pain Managment: Goal: General experience of comfort will improve Outcome: Completed/Met   Problem: Safety: Goal: Ability to remain free from injury will improve Outcome: Completed/Met   Problem: Skin Integrity: Goal: Risk for impaired skin integrity will decrease Outcome: Completed/Met

## 2022-07-18 NOTE — Progress Notes (Signed)
Progress Note  Patient Name: Katherine Hall Date of Encounter: 07/18/2022  [facility] Cardiologist: Mertie Moores, MD   Subjective   No chest pain or shortness of breath this morning.  Lengthy discussion about her progressive symptoms over the last few months.  Inpatient Medications    Scheduled Meds:  clopidogrel  75 mg Oral Daily   enoxaparin (LOVENOX) injection  30 mg Subcutaneous Daily   isosorbide mononitrate  60 mg Oral Daily   levothyroxine  50 mcg Oral QAC breakfast   metoprolol tartrate  37.5 mg Oral BID   ranolazine  500 mg Oral BID   rOPINIRole  2 mg Oral QHS   Continuous Infusions:  sodium chloride 1 mL/kg/hr (07/18/22 0755)   PRN Meds: acetaminophen, hydrALAZINE, melatonin, nitroGLYCERIN   Vital Signs    Vitals:   07/17/22 2038 07/18/22 0124 07/18/22 0442 07/18/22 0736  BP: (!) 151/70 (!) 148/72 (!) 146/60 (!) 141/64  Pulse: 70 70 73 63  Resp: '18 18 18 16  '$ Temp: 98 F (36.7 C) (!) 97.3 F (36.3 C) 97.7 F (36.5 C) 97.6 F (36.4 C)  TempSrc: Oral Oral Oral Oral  SpO2: 97%  97% 99%  Weight:  53.7 kg    Height:        Intake/Output Summary (Last 24 hours) at 07/18/2022 0954 Last data filed at 07/18/2022 0400 Gross per 24 hour  Intake 240 ml  Output 900 ml  Net -660 ml      07/18/2022    1:24 AM 07/17/2022    5:06 AM 07/15/2022   10:54 PM  Last 3 Weights  Weight (lbs) 118 lb 6.4 oz 117 lb 15.1 oz 117 lb 15.1 oz  Weight (kg) 53.706 kg 53.5 kg 53.5 kg      Telemetry    Sinus rhythm- Personally Reviewed  ECG    Sinus rhythm with age-indeterminate inferolateral infarct- Personally Reviewed  Physical Exam  Pleasant, alert and oriented, elderly woman in no distress GEN: No acute distress.   Neck: No JVD Cardiac: RRR, no murmurs, rubs, or gallops.  Respiratory: Clear to auscultation bilaterally. GI: Soft, nontender, non-distended  MS: No edema; No deformity. Neuro:  Nonfocal  Psych: Normal affect   Labs    High Sensitivity  Troponin:   Recent Labs  Lab 07/15/22 1635 07/15/22 1835 07/16/22 0055 07/16/22 0322  TROPONINIHS '14 11 12 11     '$ Chemistry Recent Labs  Lab 07/15/22 1634 07/16/22 0055 07/18/22 0553  NA 127* 133* 128*  K 3.0* 3.5 4.0  CL 89* 98 92*  CO2 '24 26 25  '$ GLUCOSE 120* 102* 105*  BUN 37* 23 8  CREATININE 1.22* 0.84 0.76  CALCIUM 9.4 9.0 9.1  MG  --  1.9  --   PROT 7.5 5.9* 5.6*  ALBUMIN 4.2 3.5 3.4*  AST '17 15 15  '$ ALT '13 11 10  '$ ALKPHOS 56 44 44  BILITOT 0.6 0.5 0.6  GFRNONAA 45* >60 >60  ANIONGAP '14 9 11    '$ Lipids  Recent Labs  Lab 07/18/22 0553  CHOL 262*  TRIG 146  HDL 53  LDLCALC 180*  CHOLHDL 4.9    Hematology Recent Labs  Lab 07/16/22 0055 07/16/22 0322 07/18/22 0553  WBC 6.4 6.5 7.6  RBC 3.64* 3.86* 3.68*  HGB 11.2* 11.8* 11.2*  HCT 32.1* 34.0* 32.4*  MCV 88.2 88.1 88.0  MCH 30.8 30.6 30.4  MCHC 34.9 34.7 34.6  RDW 13.3 13.6 13.3  PLT 365 382 361   Thyroid  Recent Labs  Lab 07/18/22 0553  TSH 0.921    BNPNo results for input(s): "BNP", "PROBNP" in the last 168 hours.  DDimer No results for input(s): "DDIMER" in the last 168 hours.   Radiology    No results found.  Cardiac Studies   Cardiac PET/CT:   LV perfusion is abnormal. There is evidence of ischemia. There is no evidence of infarction. Defect 1: There is a small defect with moderate reduction in uptake present in the apical to mid inferior location(s) that is reversible. There is abnormal wall motion in the defect area. Consistent with ischemia. The defect is consistent with abnormal perfusion in the RCA territory.   Rest left ventricular function is normal. Rest EF: 61 %. Stress left ventricular function is normal. Stress EF: 66 %. End diastolic cavity size is normal.   Myocardial blood flow was computed to be 1.86m/g/min at rest and 1.867mg/min at stress. Global myocardial blood flow reserve was 1.61 and was abnormal. Myocardial blood flow reserve was abnormal in the RCA distribution  (1.39) consistent with the ischemia seen on the perfusion imaging. The myocardial blood flow was mildly abnormal in the LAD/LCX distributions. Due to prior CABG, MBF flow values are not expected to be normal. Quite abnormal in the RCA distribution which matches the perfusion defect.   Myocardial blood flow was computed to be 1.1656m/min at rest and 1.69m39mmin at stress. Global myocardial blood flow reserve was 1.61 and was abnormal.   Coronary calcium assessment not performed due to prior revascularization.   Findings are consistent with ischemia. The study is intermediate risk.  Cath 02/03/21: 1.  Severe underlying three-vessel coronary artery disease with patent grafts including LIMA to LAD, SVG to OM1/OM 3, SVG to diagonal with 60% anastomosis stenosis and SVG to right PDA.  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 2.  Left ventricular angiography was not performed.  EF was normal by echo.  Normal LVEDP. 3.  Unsuccessful attempted angioplasty to SVG to right PDA due to inability to cross the stenosis beyond the aneurysmal segment.  There was loss of flow and acute closure with wire manipulation.  Multiple CTO wires were used but with no success.  The patient developed mild inferior ST elevation on the monitor with throat discomfort.  Symptoms improved with nitroglycerin drip.   Recommendations: The patient will have an inferior infarct although the right PDA distribution is relatively small.  The native RCA is diffusely diseased and tortuous and likely not suitable for PCI. I elected to place the patient on nitroglycerin drip and will continue medical therapy for her coronary artery disease.   The procedure was performed via the right common femoral artery after unsuccessful access via the left radial artery due to suspected radial loop.  Patient Profile     81 y27. female with  extensive CAD presenting with symptoms of unstable angina after an abnormal cardiac PET/CT demonstrated inferior ischemia  Assessment & Plan    1.  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  Acute kidney injury, resolved 3.  Hyponatremia, stable  Prior cath films reviewed.  Discussed findings of recent noninvasive testing with the patient.  Her history suggest progressive symptoms over the past few months with some typical and atypical features but certainly concerning for unstable angina/progressive bypass graft disease.  While her ischemic territory is in the inferior wall, it is  possible that she has progressive disease in the bypass graft of the circumflex distribution as well.  At the time of her previous cardiac catheterization procedure, she had patency of the sequential graft to the obtuse marginal and posterolateral branches.  She also had a patent vein graft to the diagonal branch with moderate stenosis at the distal anastomosis.  Considering the time since her last study, clear progression of symptoms, and risk for further bypass graft disease, I think it is appropriate to proceed with cardiac catheterization and possible PCI. I have reviewed the risks, indications, and alternatives to cardiac catheterization, possible angioplasty, and stenting with the patient. Risks include but are not limited to bleeding, infection, vascular injury, stroke, myocardial infection, arrhythmia, kidney injury, radiation-related injury in the case of prolonged fluoroscopy use, emergency cardiac surgery, and death. The patient understands the risks of serious complication is 1-2 in 4765 with diagnostic cardiac cath and 1-2% or less with angioplasty/stenting.  The patient has been compliant with aspirin and clopidogrel.  If her coronary anatomy is otherwise stable, I would recommend treating her RCA medically as I think PCI risk is likely prohibitive.  Discussed her case with Dr. Martinique who  will do her procedure and we are in agreement.  Full informed consent is obtained.      For questions or updates, please contact [place] Please consult [email or web address] for contact info under        Signed, Sherren Mocha, MD  07/18/2022, 9:54 AM

## 2022-07-19 ENCOUNTER — Other Ambulatory Visit (HOSPITAL_COMMUNITY): Payer: Self-pay

## 2022-07-19 ENCOUNTER — Encounter (HOSPITAL_COMMUNITY): Payer: Self-pay | Admitting: Cardiology

## 2022-07-19 DIAGNOSIS — I200 Unstable angina: Secondary | ICD-10-CM | POA: Diagnosis not present

## 2022-07-19 LAB — CBC
HCT: 33.1 % — ABNORMAL LOW (ref 36.0–46.0)
Hemoglobin: 11.1 g/dL — ABNORMAL LOW (ref 12.0–15.0)
MCH: 30.2 pg (ref 26.0–34.0)
MCHC: 33.5 g/dL (ref 30.0–36.0)
MCV: 90.2 fL (ref 80.0–100.0)
Platelets: 389 10*3/uL (ref 150–400)
RBC: 3.67 MIL/uL — ABNORMAL LOW (ref 3.87–5.11)
RDW: 13.7 % (ref 11.5–15.5)
WBC: 7.4 10*3/uL (ref 4.0–10.5)
nRBC: 0 % (ref 0.0–0.2)

## 2022-07-19 LAB — COMPREHENSIVE METABOLIC PANEL
ALT: 10 U/L (ref 0–44)
AST: 15 U/L (ref 15–41)
Albumin: 3.3 g/dL — ABNORMAL LOW (ref 3.5–5.0)
Alkaline Phosphatase: 45 U/L (ref 38–126)
Anion gap: 9 (ref 5–15)
BUN: 5 mg/dL — ABNORMAL LOW (ref 8–23)
CO2: 25 mmol/L (ref 22–32)
Calcium: 9.1 mg/dL (ref 8.9–10.3)
Chloride: 98 mmol/L (ref 98–111)
Creatinine, Ser: 0.7 mg/dL (ref 0.44–1.00)
GFR, Estimated: >60 mL/min (ref >60)
Glucose, Bld: 107 mg/dL — ABNORMAL HIGH (ref 70–99)
Potassium: 4.2 mmol/L (ref 3.5–5.1)
Sodium: 132 mmol/L — ABNORMAL LOW (ref 135–145)
Total Bilirubin: 0.5 mg/dL (ref 0.3–1.2)
Total Protein: 5.7 g/dL — ABNORMAL LOW (ref 6.5–8.1)

## 2022-07-19 MED ORDER — LISINOPRIL 20 MG PO TABS: 20.0000 mg | ORAL_TABLET | Freq: Every day | ORAL | Status: DC

## 2022-07-19 MED ORDER — LISINOPRIL 10 MG PO TABS: 10.0000 mg | ORAL_TABLET | Freq: Every day | ORAL | Status: DC

## 2022-07-19 MED ORDER — RANOLAZINE ER 500 MG PO TB12
500.0000 mg | ORAL_TABLET | Freq: Two times a day (BID) | ORAL | 4 refills | Status: DC
  Filled 2022-07-19: qty 60, 30d supply, fill #0

## 2022-07-19 MED ORDER — METOPROLOL TARTRATE 25 MG PO TABS
25.0000 mg | ORAL_TABLET | Freq: Two times a day (BID) | ORAL | 4 refills | Status: DC
  Filled 2022-07-19: qty 60, 30d supply, fill #0
  Filled 2022-07-19: qty 60, fill #0

## 2022-07-19 MED ORDER — LISINOPRIL 20 MG PO TABS
20.0000 mg | ORAL_TABLET | Freq: Every day | ORAL | Status: DC
  Administered 2022-07-19: 20 mg via ORAL
  Filled 2022-07-19: qty 1

## 2022-07-19 MED ORDER — LISINOPRIL 20 MG PO TABS
20.0000 mg | ORAL_TABLET | Freq: Every day | ORAL | 2 refills | Status: DC
  Filled 2022-07-19: qty 30, 30d supply, fill #0

## 2022-07-19 NOTE — Progress Notes (Addendum)
Progress Note  Patient Name: Katherine Hall Date of Encounter: 07/19/2022  [facility] Cardiologist: Mertie Moores, MD   Subjective   Feeling well this morning. Had a rough night.   Inpatient Medications    Scheduled Meds:  clopidogrel  75 mg Oral Daily   enoxaparin (LOVENOX) injection  40 mg Subcutaneous Q24H   isosorbide mononitrate  60 mg Oral Daily   levothyroxine  50 mcg Oral QAC breakfast   metoprolol tartrate  37.5 mg Oral BID   pantoprazole  40 mg Oral Daily   ranolazine  500 mg Oral BID   rOPINIRole  2 mg Oral QHS   sodium chloride flush  3 mL Intravenous Q12H   Continuous Infusions:  sodium chloride     PRN Meds: sodium chloride, acetaminophen, hydrALAZINE, melatonin, nitroGLYCERIN, sodium chloride flush   Vital Signs    Vitals:   07/18/22 1939 07/19/22 0111 07/19/22 0207 07/19/22 0430  BP: 135/66 (!) 109/45  (!) 139/58  Pulse: 70 68  64  Resp: '18 18  20  '$ Temp: 98.4 F (36.9 C) 98 F (36.7 C)  98.1 F (36.7 C)  TempSrc: Oral Oral  Oral  SpO2: 96% 95%  97%  Weight:   53.7 kg   Height:        Intake/Output Summary (Last 24 hours) at 07/19/2022 0902 Last data filed at 07/19/2022 0247 Gross per 24 hour  Intake 1651.91 ml  Output 1450 ml  Net 201.91 ml      07/19/2022    2:07 AM 07/18/2022    1:24 AM 07/17/2022    5:06 AM  Last 3 Weights  Weight (lbs) 118 lb 4.8 oz 118 lb 6.4 oz 117 lb 15.1 oz  Weight (kg) 53.661 kg 53.706 kg 53.5 kg      Telemetry    Sinus Rhythm - Personally Reviewed  ECG    No new tracing  Physical Exam   GEN: No acute distress.   Neck: No JVD Cardiac: RRR, no murmurs, rubs, or gallops.  Respiratory: Clear to auscultation bilaterally. GI: Soft, nontender, non-distended  MS: No edema; No deformity. Right femoral cath site stable Neuro:  Nonfocal  Psych: Normal affect   Labs    High Sensitivity Troponin:   Recent Labs  Lab 07/15/22 1635 07/15/22 1835 07/16/22 0055 07/16/22 0322  TROPONINIHS '14 11 12 11      '$ Chemistry Recent Labs  Lab 07/15/22 1634 07/16/22 0055 07/18/22 0553  NA 127* 133* 128*  K 3.0* 3.5 4.0  CL 89* 98 92*  CO2 '24 26 25  '$ GLUCOSE 120* 102* 105*  BUN 37* 23 8  CREATININE 1.22* 0.84 0.76  CALCIUM 9.4 9.0 9.1  MG  --  1.9  --   PROT 7.5 5.9* 5.6*  ALBUMIN 4.2 3.5 3.4*  AST '17 15 15  '$ ALT '13 11 10  '$ ALKPHOS 56 44 44  BILITOT 0.6 0.5 0.6  GFRNONAA 45* >60 >60  ANIONGAP '14 9 11    '$ Lipids  Recent Labs  Lab 07/18/22 0553  CHOL 262*  TRIG 146  HDL 53  LDLCALC 180*  CHOLHDL 4.9    Hematology Recent Labs  Lab 07/16/22 0055 07/16/22 0322 07/18/22 0553  WBC 6.4 6.5 7.6  RBC 3.64* 3.86* 3.68*  HGB 11.2* 11.8* 11.2*  HCT 32.1* 34.0* 32.4*  MCV 88.2 88.1 88.0  MCH 30.8 30.6 30.4  MCHC 34.9 34.7 34.6  RDW 13.3 13.6 13.3  PLT 365 382 361   Thyroid  Recent Labs  Lab 07/18/22 0553  TSH 0.921    BNPNo results for input(s): "BNP", "PROBNP" in the last 168 hours.  DDimer No results for input(s): "DDIMER" in the last 168 hours.   Radiology    CARDIAC CATHETERIZATION  Result Date: 07/18/2022   RPDA lesion is 100% stenosed.   Prox LAD to Mid LAD lesion is 100% stenosed.   Mid Cx lesion is 100% stenosed.   Prox RCA lesion is 40% stenosed.   Prox RCA to Mid RCA lesion is 70% stenosed.   Dist RCA lesion is 60% stenosed.   1st Mrg lesion is 100% stenosed.   RPAV lesion is 90% stenosed.   Origin to Prox Graft lesion is 100% stenosed.   Ost LM to Mid LM lesion is 90% stenosed.   Insertion lesion is 40% stenosed.   SVG and is normal in caliber.   SVG.   LIMA and is normal in caliber.   The graft exhibits no disease.   The graft exhibits no disease.   LV end diastolic pressure is normal. 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 Patent LIMA to the LAD Patent SVG to the first diagonal Patent sequential SVG to OM1 and OM2 Occluded SVG to the PDA Low LV EDP Plan: the SVG to PDA is known to be occluded. Otherwise no new findings compared with cardiac cath in March  2022. The other grafts are patent. The RCA has diffuse moderate to severe disease and is extremely tortuous. It is not a viable target for PCI. Would continue medical therapy.    Cardiac Studies   Cath: 07/18/22   RPDA lesion is 100% stenosed.   Prox LAD to Mid LAD lesion is 100% stenosed.   Mid Cx lesion is 100% stenosed.   Prox RCA lesion is 40% stenosed.   Prox RCA to Mid RCA lesion is 70% stenosed.   Dist RCA lesion is 60% stenosed.   1st Mrg lesion is 100% stenosed.   RPAV lesion is 90% stenosed.   Origin to Prox Graft lesion is 100% stenosed.   Ost LM to Mid LM lesion is 90% stenosed.   Insertion lesion is 40% stenosed.   SVG and is normal in caliber.   SVG.   LIMA and is normal in caliber.   The graft exhibits no disease.   The graft exhibits no disease.   LV end diastolic pressure is normal.   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  Patent LIMA to the LAD Patent SVG to the first diagonal Patent sequential SVG to OM1 and OM2 Occluded SVG to the PDA Low LV EDP   Plan: the SVG to PDA is known to be occluded. Otherwise no new findings compared with cardiac cath in March 2022. The other grafts are patent. The RCA has diffuse moderate to severe disease and is extremely tortuous. It is not a viable target for PCI. Would continue medical therapy.  Diagnostic Dominance: Right   Patient Profile     81 y.o. female with extensive CAD presenting with symptoms of unstable angina after an abnormal cardiac PET/CT demonstrated inferior ischemia.   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 Underwent cardiac cath 8/14 noted above with patent LIMA-LAD, SVG-1st diag, SVG-OM1/OM2 with known occlusion of SVG-PDA. RCA is diffusely disease, no target for PCI  option. Recommendations for  medical management.  -- continue plavix, Imdur '60mg'$  daily (was having headaches with higher dose but seem to have improved), metoprolol 37.'5mg'$  BID, ranexa '500mg'$  BID (new this admission) and resume home lisinopril at '10mg'$  daily   HTN: blood pressures are stable -- continue metoprolol and lisinopril  HLD: on repatha   Per Primary Hypothyroidism AKI N/V   Will arrange outpatient follow up in the office.   For questions or updates, please contact [place] Please consult [email or web address] for contact info under        Signed, Reino Bellis, NP  07/19/2022, 9:02 AM     ATTENDING ATTESTATION:  After conducting a review of all available clinical information with the care team, interviewing the patient, and performing a physical exam, I agree with the findings and plan described in this note.   GEN: No acute distress.   HEENT:  MMM, no JVD, no scleral icterus Cardiac: RRR, no murmurs, rubs, or gallops.  Respiratory: Clear to auscultation bilaterally. GI: Soft, nontender, non-distended  MS: No edema; No deformity. Neuro:  Nonfocal  Vasc:  +2 radial pulses; RFA CDI  Patient doing well after coronary/bypass angiography via R CFA approach.  Overnight, denies any recurrent angina or dyspnea.  Right femoral access site remains stable  I reviewed the patient's coronary and bypass angiography study done yesterday.  This demonstrated patent vein graft to diagonal, LIMA to LAD, and vein graft to obtuse marginal.  The patient's vein graft to PDA is occluded and she has a moderate to severe disease of the native right coronary artery.  Her LVEDP was low at 3 mmHg.  This coronary artery is quite tortuous.  Based on review of coronary angiogram yesterday, decision made to defer PCI of native right coronary artery and pursue optimal medical therapy.  Her blood pressure is mildly elevated so we will increase lisinopril to 20 mg.  Otherwise cardiology will sign off and we will arrange for  outpatient follow-up.   Lenna Sciara, MD Pager [phone]

## 2022-07-19 NOTE — Progress Notes (Signed)
Mobility Specialist Progress Note:   07/19/22 1426  Mobility  Activity Ambulated with assistance in hallway  Level of Assistance Contact guard assist, steadying assist  Assistive Device  (HHA)  Distance Ambulated (ft) 450 ft  Activity Response Tolerated well  $Mobility charge 1 Mobility   Pt received in bed willing to participate in mobility. No complaints of pain. Left in chair with call bell in reach and all needs met.    [facility] Katherine Hall Mobility Specialist

## 2022-07-19 NOTE — Discharge Summary (Signed)
Physician Discharge Summary  Katherine Hall ZSW:[id number] DOB: 1941/09/11 DOA: 07/15/2022  PCP: Stacie Glaze, DO  Admit date: 07/15/2022 Discharge date: 07/19/2022  Admitted From: Home Disposition: Home  Recommendations for Outpatient Follow-up:  Follow up with PCP in 1-2 weeks Please obtain BMP/CBC in one week Please follow up with cardiology  Home Health: Yes Equipment/Devices: None  Discharge Condition: Stable CODE STATUS: Full code Diet recommendation: Cardiac  brief/Interim Summary: 81 year old female with history of CABG, hypertension, hyperlipidemia, peripheral vascular disease and hypothyroidism with recent myocardial perfusion scan positive for ischemia in the inferior myocardium admitted with chest pain interfering with her activities of daily living.  She describes the pain as squeezing sensation on her chest like a rubber band radiating her heart.  Her Imdur dose was increased to 60 mg recently.   Discharge Diagnoses:  Principal Problem:   Unstable angina (HCC) Active Problems:   Hypothyroidism   Essential hypertension   Hyperlipidemia   Anemia, unspecified   Chest pain   S/P CABG x 5   Coronary artery disease involving native coronary artery of native heart with angina pectoris ([place])   Peripheral arterial disease ([place])     #1 chest pain/angina with recent abnormal myocardial perfusion scan concerning for reversible ischemia in the inferior.  Cath 07/18/2022-RPDA lesion is 100% stenosed.   Prox LAD to Mid LAD lesion is 100% stenosed.   Mid Cx lesion is 100% stenosed.   Prox RCA lesion is 40% stenosed.   Prox RCA to Mid RCA lesion is 70% stenosed.   Dist RCA lesion is 60% stenosed.   1st Mrg lesion is 100% stenosed.   RPAV lesion is 90% stenosed.   Origin to Prox Graft lesion is 100% stenosed.   Ost LM to Mid LM lesion is 90% stenosed.   Insertion lesion is 40% stenosed.   SVG and is normal in caliber.   SVG.   LIMA and is normal in caliber.   The  graft exhibits no disease.   The graft exhibits no disease.   LV end diastolic pressure is normal.   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  Patent LIMA to the LAD Patent SVG to the first diagonal Patent sequential SVG to OM1 and OM2 Occluded SVG to the PDA Low LV EDP   Compared to the cath in 2022 March SVG to PDA is occluded otherwise no new findings.  RCA has diffuse moderate to severe disease in his extremities were tortuous.  This started a viable target for PCI.  Would continue medical therapy. She was started on Ranexa during this hospital stay. #2 nausea and vomiting resolved   #3 history of essential hypertension continue ACE inhibitor, HCTZ, metoprolol, isosorbide mononitrate.  #4 hypothyroidism continue Synthroid.  Last TSH 0.9 from March 2022 will repeat TSH in a.m.   #5 hyperlipidemia on Repatha prior to admission Last LDL is 151 from 2020 Will repeat lipid profile tomorrow   #6 AKI resolved.  From dehydration nausea and vomiting was on lisinopril and HCTZ prior to admission which is on hold.   #7 mild hyponatremia sodium was 132 on discharge.    Estimated body mass index is 26.52 kg/m as calculated from the following:   Height as of this encounter: '4\' 8"'$  (1.422 m).   Weight as of this encounter: 53.7 kg.  Discharge Instructions  Discharge Instructions     Diet - low sodium heart healthy   Complete by: As directed    Increase activity slowly  Complete by: As directed       Allergies as of 07/19/2022       Reactions   Penicillins Anaphylaxis   Did it involve swelling of the face/tongue/throat, SOB, or low BP? Yes Did it involve sudden or severe rash/hives, skin peeling, or any reaction on the inside of your mouth or nose? No Did you need to seek medical attention at a hospital or doctor's office? Yes When did it last happen?      10+ years If all above answers are "NO", may proceed with cephalosporin use.   Lincomycin Swelling, Other (See  Comments)   passed out   Naproxen Sodium Other (See Comments)   Stomach ulcers   Statins Other (See Comments)   Myalgias   Crestor [rosuvastatin]    myalgias   Latex Other (See Comments)   Blisters when in contact with skin   Lipitor [atorvastatin]    myalgias   Tape    blistering        Medication List     STOP taking these medications    alendronate 70 MG tablet Commonly known as: FOSAMAX   diphenhydrAMINE 25 MG tablet Commonly known as: BENADRYL   estradiol 0.5 MG tablet Commonly known as: ESTRACE   traMADol 50 MG tablet Commonly known as: ULTRAM   triamcinolone acetonide 40 MG/ML injection (RADIOLOGY ONLY) Commonly known as: KENALOG-40       TAKE these medications    clopidogrel 75 MG tablet Commonly known as: Plavix Take 1 tablet (75 mg total) by mouth daily. Resume only after 5 days ( on 05/13/21)   diphenhydramine-acetaminophen 25-500 MG Tabs tablet Commonly known as: TYLENOL PM Take 2 tablets by mouth at bedtime.   fluticasone 50 MCG/ACT nasal spray Commonly known as: FLONASE Place 1 spray into both nostrils daily as needed for allergies.   hydrochlorothiazide 25 MG tablet Commonly known as: HYDRODIURIL TAKE 1 TABLET BY MOUTH  DAILY   isosorbide mononitrate 60 MG 24 hr tablet Commonly known as: IMDUR Take 1 tablet (60 mg total) by mouth daily.   levothyroxine 50 MCG tablet Commonly known as: SYNTHROID Take 50 mcg by mouth daily before breakfast.   lisinopril 20 MG tablet Commonly known as: ZESTRIL Take 1 tablet (20 mg total) by mouth daily. Start taking on: July 20, 2022 What changed: when to take this   LUBRICATING EYE DROPS OP Place 1 drop into both eyes daily as needed (dry eyes).   Metoprolol Tartrate 37.5 MG Tabs Take 37.5 mg by mouth 2 (two) times daily. What changed:  medication strength how much to take how to take this when to take this additional instructions   nitroGLYCERIN 0.4 MG SL tablet Commonly known as:  NITROSTAT Place 1 tablet (0.4 mg total) under the tongue every 5 (five) minutes x 3 doses as needed for chest pain.   pantoprazole 40 MG tablet Commonly known as: PROTONIX Take 40 mg by mouth 2 (two) times daily.   ranolazine 500 MG 12 hr tablet Commonly known as: RANEXA Take 1 tablet (500 mg total) by mouth 2 (two) times daily.   Repatha Pushtronex System 420 MG/3.5ML Soct Generic drug: Evolocumab with Infusor Inject 3.6 mLs into the skin every 30 (thirty) days.   rOPINIRole 2 MG tablet Commonly known as: REQUIP Take 2 mg by mouth at bedtime.        Follow-up Information     Stacie Glaze, DO. Go on 07/27/2022.   Specialty: Family Medicine Why: '@2'$ :30pm Contact information:  [place] [address] [phone]         Nahser, Wonda Cheng, MD .   Specialty: Cardiology Contact information: [address] [place] [address] [phone]                Allergies  Allergen Reactions   Penicillins Anaphylaxis    Did it involve swelling of the face/tongue/throat, SOB, or low BP? Yes Did it involve sudden or severe rash/hives, skin peeling, or any reaction on the inside of your mouth or nose? No Did you need to seek medical attention at a hospital or doctor's office? Yes When did it last happen?      10+ years If all above answers are "NO", may proceed with cephalosporin use.     Lincomycin Swelling and Other (See Comments)    passed out   Naproxen Sodium Other (See Comments)    Stomach ulcers   Statins Other (See Comments)    Myalgias   Crestor [Rosuvastatin]     myalgias   Latex Other (See Comments)    Blisters when in contact with skin   Lipitor [Atorvastatin]     myalgias   Tape     blistering    Consultations: Cardiology   Procedures/Studies: CARDIAC CATHETERIZATION  Result Date: 07/18/2022   RPDA lesion is 100% stenosed.   Prox LAD to Mid LAD lesion is 100% stenosed.   Mid Cx lesion is  100% stenosed.   Prox RCA lesion is 40% stenosed.   Prox RCA to Mid RCA lesion is 70% stenosed.   Dist RCA lesion is 60% stenosed.   1st Mrg lesion is 100% stenosed.   RPAV lesion is 90% stenosed.   Origin to Prox Graft lesion is 100% stenosed.   Ost LM to Mid LM lesion is 90% stenosed.   Insertion lesion is 40% stenosed.   SVG and is normal in caliber.   SVG.   LIMA and is normal in caliber.   The graft exhibits no disease.   The graft exhibits no disease.   LV end diastolic pressure is normal. 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 Patent LIMA to the LAD Patent SVG to the first diagonal Patent sequential SVG to OM1 and OM2 Occluded SVG to the PDA Low LV EDP Plan: the SVG to PDA is known to be occluded. Otherwise no new findings compared with cardiac cath in March 2022. The other grafts are patent. The RCA has diffuse moderate to severe disease and is extremely tortuous. It is not a viable target for PCI. Would continue medical therapy.   DG Chest Port 1 View  Result Date: 07/15/2022 CLINICAL DATA:  Chest pain EXAM: PORTABLE CHEST 1 VIEW COMPARISON:  Previous studies including the examination of 04/14/2021 FINDINGS: Cardiac size is within normal limits. Lung fields are clear of any infiltrates or pulmonary edema. There is no pleural effusion or pneumothorax. Skin folds are noted overlying the lateral aspect of lower lung fields. There is previous cardiac surgery. Degenerative changes are noted in both shoulders, much severe in the right shoulder. IMPRESSION: No active disease. Electronically Signed   By: Elmer Picker M.D.   On: 07/15/2022 16:45   NM PET CT CARDIAC PERFUSION MULTI W/ABSOLUTE BLOODFLOW  Result Date: 07/06/2022   LV perfusion is abnormal. There is evidence of ischemia. There is no evidence of infarction. Defect 1: There is a small defect with moderate reduction in uptake present in the apical  to mid inferior location(s) that is reversible. There is abnormal wall motion in  the defect area. Consistent with ischemia. The defect is consistent with abnormal perfusion in the RCA territory.   Rest left ventricular function is normal. Rest EF: 61 %. Stress left ventricular function is normal. Stress EF: 66 %. End diastolic cavity size is normal.   Myocardial blood flow was computed to be 1.52m/g/min at rest and 1.89mg/min at stress. Global myocardial blood flow reserve was 1.61 and was abnormal. Myocardial blood flow reserve was abnormal in the RCA distribution (1.39) consistent with the ischemia seen on the perfusion imaging. The myocardial blood flow was mildly abnormal in the LAD/LCX distributions. Due to prior CABG, MBF flow values are not expected to be normal. Quite abnormal in the RCA distribution which matches the perfusion defect.   Myocardial blood flow was computed to be 1.163m/min at rest and 1.70m60mmin at stress. Global myocardial blood flow reserve was 1.61 and was abnormal.   Coronary calcium assessment not performed due to prior revascularization.   Findings are consistent with ischemia. The study is intermediate risk.   Electronically signed by WeslEleonore Chiquito. CLINICAL DATA:  This over-read does not include interpretation of cardiac or coronary anatomy or pathology. No PET data interpretation. The cardiac PET interpretation by the cardiologist is attached. COMPARISON:  None Available. FINDINGS: Limited view of the lung parenchyma demonstrates no suspicious nodularity. Airways are normal. Limited view of the mediastinum demonstrates no adenopathy. Esophagus normal. Limited view of the upper abdomen unremarkable. Limited view of the skeleton and chest wall demonstrates midline sternotomy. IMPRESSION: No significant extracardiac findings. Electronically Signed   By: StewSuzy Bouchard.   On: 07/05/2022 12:25  (Echo, Carotid, EGD, Colonoscopy, ERCP)    Subjective: Resting in bed anxious to go home denies any discomfort nausea vomiting or chest pain  Discharge  Exam: Vitals:   07/19/22 0111 07/19/22 0430  BP: (!) 109/45 (!) 139/58  Pulse: 68 64  Resp: 18 20  Temp: 98 F (36.7 C) 98.1 F (36.7 C)  SpO2: 95% 97%   Vitals:   07/18/22 1939 07/19/22 0111 07/19/22 0207 07/19/22 0430  BP: 135/66 (!) 109/45  (!) 139/58  Pulse: 70 68  64  Resp: '18 18  20  '$ Temp: 98.4 F (36.9 C) 98 F (36.7 C)  98.1 F (36.7 C)  TempSrc: Oral Oral  Oral  SpO2: 96% 95%  97%  Weight:   53.7 kg   Height:        General: Pt is alert, awake, not in acute distress Cardiovascular: RRR, S1/S2 +, no rubs, no gallops Respiratory: CTA bilaterally, no wheezing, no rhonchi Abdominal: Soft, NT, ND, bowel sounds + Extremities: no edema, no cyanosis    The results of significant diagnostics from this hospitalization (including imaging, microbiology, ancillary and laboratory) are listed below for reference.     Microbiology: Recent Results (from the past 240 hour(s))  SARS Coronavirus 2 by RT PCR (hospital order, [facility]) *cepheid single result test* Anterior Nasal Swab     Status: None   Collection Time: 07/15/22  5:19 PM   Specimen: Anterior Nasal Swab  Result Value Ref Range Status   SARS Coronavirus 2 by RT PCR NEGATIVE NEGATIVE Final    Comment: (NOTE) SARS-CoV-2 target nucleic acids are NOT DETECTED.  The SARS-CoV-2 RNA is generally detectable in upper and lower respiratory specimens during the acute phase of infection. The lowest concentration of SARS-CoV-2 viral copies this assay  can detect is 250 copies / mL. A negative result does not preclude SARS-CoV-2 infection and should not be used as the sole basis for treatment or other patient management decisions.  A negative result may occur with improper specimen collection / handling, submission of specimen other than nasopharyngeal swab, presence of viral mutation(s) within the areas targeted by this assay, and inadequate number of viral copies (<250 copies / mL). A negative  result must be combined with clinical observations, patient history, and epidemiological information.  Fact Sheet for Patients:   https://www.patel.info/  Fact Sheet for Healthcare Providers: https://hall.com/  This test is not yet approved or  cleared by the [place] FDA and has been authorized for detection and/or diagnosis of SARS-CoV-2 by FDA under an Emergency Use Authorization (EUA).  This EUA will remain in effect (meaning this test can be used) for the duration of the COVID-19 declaration under Section 564(b)(1) of the Act, 21 U.S.C. section 360bbb-3(b)(1), unless the authorization is terminated or revoked sooner.  Performed at [facility], [address] [place], [place] [address]      Labs: BNP (last 3 results) No results for input(s): "BNP" in the last 8760 hours. Basic Metabolic Panel: Recent Labs  Lab 07/15/22 1634 07/16/22 0055 07/18/22 0553 07/19/22 1206  NA 127* 133* 128* 132*  K 3.0* 3.5 4.0 4.2  CL 89* 98 92* 98  CO2 '24 26 25 25  '$ GLUCOSE 120* 102* 105* 107*  BUN 37* 23 8 5*  CREATININE 1.22* 0.84 0.76 0.70  CALCIUM 9.4 9.0 9.1 9.1  MG  --  1.9  --   --    Liver Function Tests: Recent Labs  Lab 07/15/22 1634 07/16/22 0055 07/18/22 0553 07/19/22 1206  AST '17 15 15 15  '$ ALT '13 11 10 10  '$ ALKPHOS 56 44 44 45  BILITOT 0.6 0.5 0.6 0.5  PROT 7.5 5.9* 5.6* 5.7*  ALBUMIN 4.2 3.5 3.4* 3.3*   No results for input(s): "LIPASE", "AMYLASE" in the last 168 hours. No results for input(s): "AMMONIA" in the last 168 hours. CBC: Recent Labs  Lab 07/15/22 1634 07/16/22 0055 07/16/22 0322 07/18/22 0553 07/19/22 1206  WBC 9.1 6.4 6.5 7.6 7.4  NEUTROABS 5.3 3.5  --   --   --   HGB 12.7 11.2* 11.8* 11.2* 11.1*  HCT 36.5 32.1* 34.0* 32.4* 33.1*  MCV 88.0 88.2 88.1 88.0 90.2  PLT 467* 365 382 361 389   Cardiac Enzymes: No results for input(s): "CKTOTAL", "CKMB", "CKMBINDEX", "TROPONINI" in the  last 168 hours. BNP: Invalid input(s): "POCBNP" CBG: No results for input(s): "GLUCAP" in the last 168 hours. D-Dimer No results for input(s): "DDIMER" in the last 72 hours. Hgb A1c No results for input(s): "HGBA1C" in the last 72 hours. Lipid Profile Recent Labs    07/18/22 0553  CHOL 262*  HDL 53  LDLCALC 180*  TRIG 146  CHOLHDL 4.9   Thyroid function studies Recent Labs    07/18/22 0553  TSH 0.921   Anemia work up No results for input(s): "VITAMINB12", "FOLATE", "FERRITIN", "TIBC", "IRON", "RETICCTPCT" in the last 72 hours. Urinalysis    Component Value Date/Time   COLORURINE YELLOW 04/14/2021 1406   APPEARANCEUR HAZY (A) 04/14/2021 1406   LABSPEC >1.030 (H) 04/14/2021 1406   PHURINE 5.5 04/14/2021 1406   [place] 04/14/2021 1406   [place] 04/14/2021 1406   [place] 04/14/2021 1406   [place] 04/14/2021 1406   [place] 04/14/2021 1406  NITRITE NEGATIVE 04/14/2021 1406   LEUKOCYTESUR NEGATIVE 04/14/2021 1406   Sepsis Labs Recent Labs  Lab 07/16/22 0055 07/16/22 0322 07/18/22 0553 07/19/22 1206  WBC 6.4 6.5 7.6 7.4   Microbiology Recent Results (from the past 240 hour(s))  SARS Coronavirus 2 by RT PCR (hospital order, performed in [facility] hospital lab) *cepheid single result test* Anterior Nasal Swab     Status: None   Collection Time: 07/15/22  5:19 PM   Specimen: Anterior Nasal Swab  Result Value Ref Range Status   SARS Coronavirus 2 by RT PCR NEGATIVE NEGATIVE Final    Comment: (NOTE) SARS-CoV-2 target nucleic acids are NOT DETECTED.  The SARS-CoV-2 RNA is generally detectable in upper and lower respiratory specimens during the acute phase of infection. The lowest concentration of SARS-CoV-2 viral copies this assay can detect is 250 copies / mL. A negative result does not preclude SARS-CoV-2 infection and should not be used as the sole basis for treatment or other patient management decisions.   A negative result may occur with improper specimen collection / handling, submission of specimen other than nasopharyngeal swab, presence of viral mutation(s) within the areas targeted by this assay, and inadequate number of viral copies (<250 copies / mL). A negative result must be combined with clinical observations, patient history, and epidemiological information.  Fact Sheet for Patients:   https://www.patel.info/  Fact Sheet for Healthcare Providers: https://hall.com/  This test is not yet approved or  cleared by the [place] FDA and has been authorized for detection and/or diagnosis of SARS-CoV-2 by FDA under an Emergency Use Authorization (EUA).  This EUA will remain in effect (meaning this test can be used) for the duration of the COVID-19 declaration under Section 564(b)(1) of the Act, 21 U.S.C. section 360bbb-3(b)(1), unless the authorization is terminated or revoked sooner.  Performed at [facility], [address] [place], [place] [address]      Time coordinating discharge: 34 minutes  SIGNED:  Georgette Shell, MD  [facility] 07/19/2022, 2:43 PM

## 2022-07-19 NOTE — TOC Transition Note (Signed)
Transition of Care [facility]) - CM/SW Discharge Note   Patient Details  Name: Katherine Hall MRN: [id number] Date of Birth: March 29, 1941  [facility] CM/SW Contact:  Zenon Mayo, RN Phone Number: 07/19/2022, 10:51 AM   Clinical Narrative:    Patient for possible dc today, she is s/p heart cath which was clean, she is form home with spouse, she has no needs.          Patient Goals and CMS Choice        Discharge Placement                       Discharge Plan and Services                                     Social Determinants of Health (SDOH) Interventions     Readmission Risk Interventions     No data to display

## 2022-07-20 LAB — LIPOPROTEIN A (LPA): Lipoprotein (a): 242.1 nmol/L — ABNORMAL HIGH (ref <75.0)

## 2022-07-28 ENCOUNTER — Other Ambulatory Visit: Payer: Self-pay

## 2022-07-28 ENCOUNTER — Encounter (HOSPITAL_COMMUNITY): Payer: Self-pay

## 2022-07-28 ENCOUNTER — Observation Stay (HOSPITAL_BASED_OUTPATIENT_CLINIC_OR_DEPARTMENT_OTHER)
Admission: EM | Admit: 2022-07-28 | Discharge: 2022-07-29 | Disposition: A | Discharge status: Home / Self Care | Payer: Medicare Other | Attending: Internal Medicine | Admitting: Internal Medicine

## 2022-07-28 ENCOUNTER — Encounter (HOSPITAL_BASED_OUTPATIENT_CLINIC_OR_DEPARTMENT_OTHER): Payer: Self-pay | Admitting: Emergency Medicine

## 2022-07-28 DIAGNOSIS — Z951 Presence of aortocoronary bypass graft: Secondary | ICD-10-CM | POA: Diagnosis not present

## 2022-07-28 DIAGNOSIS — E876 Hypokalemia: Secondary | ICD-10-CM | POA: Insufficient documentation

## 2022-07-28 DIAGNOSIS — Z7902 Long term (current) use of antithrombotics/antiplatelets: Secondary | ICD-10-CM | POA: Insufficient documentation

## 2022-07-28 DIAGNOSIS — Z955 Presence of coronary angioplasty implant and graft: Secondary | ICD-10-CM | POA: Diagnosis not present

## 2022-07-28 DIAGNOSIS — R531 Weakness: Secondary | ICD-10-CM | POA: Diagnosis present

## 2022-07-28 DIAGNOSIS — I10 Essential (primary) hypertension: Secondary | ICD-10-CM | POA: Insufficient documentation

## 2022-07-28 DIAGNOSIS — Z91040 Latex allergy status: Secondary | ICD-10-CM | POA: Insufficient documentation

## 2022-07-28 DIAGNOSIS — E871 Hypo-osmolality and hyponatremia: Secondary | ICD-10-CM | POA: Diagnosis not present

## 2022-07-28 DIAGNOSIS — R627 Adult failure to thrive: Secondary | ICD-10-CM | POA: Diagnosis not present

## 2022-07-28 DIAGNOSIS — I2510 Atherosclerotic heart disease of native coronary artery without angina pectoris: Secondary | ICD-10-CM | POA: Insufficient documentation

## 2022-07-28 DIAGNOSIS — Z79899 Other long term (current) drug therapy: Secondary | ICD-10-CM | POA: Diagnosis not present

## 2022-07-28 DIAGNOSIS — I959 Hypotension, unspecified: Secondary | ICD-10-CM | POA: Insufficient documentation

## 2022-07-28 DIAGNOSIS — E039 Hypothyroidism, unspecified: Secondary | ICD-10-CM | POA: Diagnosis not present

## 2022-07-28 DIAGNOSIS — N179 Acute kidney failure, unspecified: Secondary | ICD-10-CM | POA: Diagnosis not present

## 2022-07-28 HISTORY — DX: Acute kidney failure, unspecified: N17.9

## 2022-07-28 LAB — CBC WITH DIFFERENTIAL/PLATELET
Abs Immature Granulocytes: 0.02 10*3/uL (ref 0.00–0.07)
Basophils Absolute: 0.1 10*3/uL (ref 0.0–0.1)
Basophils Relative: 1 %
Eosinophils Absolute: 0.3 10*3/uL (ref 0.0–0.5)
Eosinophils Relative: 3 %
HCT: 32.6 % — ABNORMAL LOW (ref 36.0–46.0)
Hemoglobin: 11.4 g/dL — ABNORMAL LOW (ref 12.0–15.0)
Immature Granulocytes: 0 %
Lymphocytes Relative: 23 %
Lymphs Abs: 2 10*3/uL (ref 0.7–4.0)
MCH: 30.4 pg (ref 26.0–34.0)
MCHC: 35 g/dL (ref 30.0–36.0)
MCV: 86.9 fL (ref 80.0–100.0)
Monocytes Absolute: 0.8 10*3/uL (ref 0.1–1.0)
Monocytes Relative: 9 %
Neutro Abs: 5.7 10*3/uL (ref 1.7–7.7)
Neutrophils Relative %: 64 %
Platelets: 411 10*3/uL — ABNORMAL HIGH (ref 150–400)
RBC: 3.75 MIL/uL — ABNORMAL LOW (ref 3.87–5.11)
RDW: 13.5 % (ref 11.5–15.5)
WBC: 8.9 10*3/uL (ref 4.0–10.5)
nRBC: 0 % (ref 0.0–0.2)

## 2022-07-28 LAB — COMPREHENSIVE METABOLIC PANEL
ALT: 11 U/L (ref 0–44)
AST: 18 U/L (ref 15–41)
Albumin: 4.2 g/dL (ref 3.5–5.0)
Alkaline Phosphatase: 44 U/L (ref 38–126)
Anion gap: 14 (ref 5–15)
BUN: 49 mg/dL — ABNORMAL HIGH (ref 8–23)
CO2: 23 mmol/L (ref 22–32)
Calcium: 9.3 mg/dL (ref 8.9–10.3)
Chloride: 87 mmol/L — ABNORMAL LOW (ref 98–111)
Creatinine, Ser: 1.62 mg/dL — ABNORMAL HIGH (ref 0.44–1.00)
GFR, Estimated: 32 mL/min — ABNORMAL LOW (ref >60)
Glucose, Bld: 112 mg/dL — ABNORMAL HIGH (ref 70–99)
Potassium: 3 mmol/L — ABNORMAL LOW (ref 3.5–5.1)
Sodium: 124 mmol/L — ABNORMAL LOW (ref 135–145)
Total Bilirubin: 0.7 mg/dL (ref 0.3–1.2)
Total Protein: 6.9 g/dL (ref 6.5–8.1)

## 2022-07-28 LAB — URINALYSIS, ROUTINE W REFLEX MICROSCOPIC
Bilirubin Urine: NEGATIVE
Glucose, UA: NEGATIVE mg/dL
Hgb urine dipstick: NEGATIVE
Ketones, ur: NEGATIVE mg/dL
Nitrite: NEGATIVE
Protein, ur: NEGATIVE mg/dL
Specific Gravity, Urine: <=1.005 (ref 1.005–1.030)
pH: 5.5 (ref 5.0–8.0)

## 2022-07-28 LAB — T4, FREE: Free T4: 1.09 ng/dL (ref 0.61–1.12)

## 2022-07-28 LAB — TROPONIN I (HIGH SENSITIVITY)
Troponin I (High Sensitivity): 8 ng/L (ref <18)
Troponin I (High Sensitivity): 9 ng/L (ref <18)

## 2022-07-28 LAB — SODIUM, URINE, RANDOM: Sodium, Ur: 31 mmol/L

## 2022-07-28 LAB — URINALYSIS, MICROSCOPIC (REFLEX): RBC / HPF: NONE SEEN RBC/hpf (ref 0–5)

## 2022-07-28 LAB — MAGNESIUM
Magnesium: 2.1 mg/dL (ref 1.7–2.4)
Magnesium: 2 mg/dL (ref 1.7–2.4)

## 2022-07-28 LAB — PHOSPHORUS: Phosphorus: 3.1 mg/dL (ref 2.5–4.6)

## 2022-07-28 LAB — TSH: TSH: 1.109 u[IU]/mL (ref 0.350–4.500)

## 2022-07-28 MED ORDER — ISOSORBIDE MONONITRATE ER 60 MG PO TB24: 60.0000 mg | ORAL_TABLET | Freq: Every day | ORAL | Status: DC

## 2022-07-28 MED ORDER — POTASSIUM CHLORIDE CRYS ER 20 MEQ PO TBCR
40.0000 meq | EXTENDED_RELEASE_TABLET | Freq: Once | ORAL | Status: AC
  Administered 2022-07-28: 40 meq via ORAL
  Filled 2022-07-28: qty 2

## 2022-07-28 MED ORDER — RANOLAZINE ER 500 MG PO TB12
500.0000 mg | ORAL_TABLET | Freq: Two times a day (BID) | ORAL | Status: DC
  Administered 2022-07-28 – 2022-07-29 (×2): 500 mg via ORAL
  Filled 2022-07-28 (×3): qty 1

## 2022-07-28 MED ORDER — FLUTICASONE PROPIONATE 50 MCG/ACT NA SUSP
1.0000 | Freq: Every day | NASAL | Status: DC | PRN
  Filled 2022-07-28: qty 16

## 2022-07-28 MED ORDER — HYDRALAZINE HCL 10 MG PO TABS: 10.0000 mg | ORAL_TABLET | Freq: Four times a day (QID) | ORAL | Status: DC | PRN

## 2022-07-28 MED ORDER — DIPHENHYDRAMINE-APAP (SLEEP) 25-500 MG PO TABS: 2.0000 | ORAL_TABLET | Freq: Every day | ORAL | Status: DC

## 2022-07-28 MED ORDER — SODIUM CHLORIDE 0.9 % IV SOLN: INTRAVENOUS | Status: AC

## 2022-07-28 MED ORDER — HYDRALAZINE HCL 25 MG PO TABS: 25.0000 mg | ORAL_TABLET | Freq: Four times a day (QID) | ORAL | Status: DC | PRN

## 2022-07-28 MED ORDER — ROPINIROLE HCL 0.5 MG PO TABS
2.0000 mg | ORAL_TABLET | Freq: Every day | ORAL | Status: DC
  Administered 2022-07-28: 2 mg via ORAL
  Filled 2022-07-28: qty 4

## 2022-07-28 MED ORDER — CLOPIDOGREL BISULFATE 75 MG PO TABS
75.0000 mg | ORAL_TABLET | Freq: Every day | ORAL | Status: DC
  Administered 2022-07-29: 75 mg via ORAL
  Filled 2022-07-28: qty 1

## 2022-07-28 MED ORDER — METOPROLOL TARTRATE 25 MG PO TABS
25.0000 mg | ORAL_TABLET | Freq: Two times a day (BID) | ORAL | Status: DC
  Administered 2022-07-28 – 2022-07-29 (×2): 25 mg via ORAL
  Filled 2022-07-28 (×2): qty 1

## 2022-07-28 MED ORDER — PANTOPRAZOLE SODIUM 40 MG PO TBEC
40.0000 mg | DELAYED_RELEASE_TABLET | Freq: Two times a day (BID) | ORAL | Status: DC
  Administered 2022-07-28 – 2022-07-29 (×2): 40 mg via ORAL
  Filled 2022-07-28 (×2): qty 1

## 2022-07-28 MED ORDER — ISOSORBIDE MONONITRATE ER 30 MG PO TB24
30.0000 mg | ORAL_TABLET | Freq: Every day | ORAL | Status: DC
  Administered 2022-07-29: 30 mg via ORAL
  Filled 2022-07-28: qty 1

## 2022-07-28 MED ORDER — LEVOTHYROXINE SODIUM 50 MCG PO TABS
50.0000 ug | ORAL_TABLET | Freq: Every day | ORAL | Status: DC
Start: 2022-07-29 — End: 2022-07-29
  Administered 2022-07-29: 50 ug via ORAL
  Filled 2022-07-28: qty 1

## 2022-07-28 MED ORDER — HEPARIN SODIUM (PORCINE) 5000 UNIT/ML IJ SOLN
5000.0000 [IU] | Freq: Two times a day (BID) | INTRAMUSCULAR | Status: DC
  Administered 2022-07-28 – 2022-07-29 (×2): 5000 [IU] via SUBCUTANEOUS
  Filled 2022-07-28 (×2): qty 1

## 2022-07-28 MED ORDER — SODIUM CHLORIDE 0.9 % IV BOLUS (SEPSIS)
500.0000 mL | Freq: Once | INTRAVENOUS | Status: AC
  Administered 2022-07-28: 500 mL via INTRAVENOUS

## 2022-07-28 NOTE — ED Provider Notes (Signed)
[facility] EMERGENCY DEPARTMENT Provider Note   CSN: [id number] Arrival date & time: 07/28/22  1201     History  Chief Complaint  Patient presents with   Abnormal Lab   Weakness    Katherine Hall is a 81 y.o. female.  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  Patient has not been feeling well since her recent hospitalization.  She denies any active chest pain or shortness of breath since leaving the hospital but notes feeling not feeling back to her baseline.  She has had no appetite no desire to eat or drink.  Per her husband's been she has not been drinking much fluids at all nor has she been eating.  She does note some changes to her meds since discharge including blood pressure meds and is consistent with taking her aspirin and Plavix.  She went to see her doctor yesterday for follow-up who advised to come to the ER for abnormal labs including low potassium and sodium.  She has had no abdominal pain, no vomiting, no diarrhea, no urinary symptoms.  She feels generally fatigued and generally weak but no focal weakness or paresthesias.  No head injury but did have a recent fall that her husband caught her for.   Abnormal Lab Weakness      Home Medications Prior to Admission medications   Medication Sig Start Date End Date Taking? Authorizing Provider  Carboxymethylcellul-Glycerin (LUBRICATING EYE DROPS OP) Place 1 drop into both eyes daily as needed (dry eyes).    [provider]  clopidogrel (PLAVIX) 75 MG tablet Take 1 tablet (75 mg total) by mouth daily. Resume only after 5 days ( on 05/13/21) 05/08/21   Shelly Coss, MD  diphenhydramine-acetaminophen (TYLENOL PM) 25-500 MG TABS tablet Take 2 tablets by mouth at bedtime.     [provider]  Evolocumab with Infusor ([place]) 420 MG/3.5ML SOCT Inject 3.6 mLs into the skin every 30 (thirty) days. 02/28/22   Wellington Hampshire, MD  fluticasone (FLONASE) 50 MCG/ACT nasal spray Place 1 spray into both nostrils daily as needed for allergies. 02/22/18   [provider]  hydrochlorothiazide (HYDRODIURIL) 25 MG tablet TAKE 1 TABLET BY MOUTH  DAILY 09/29/21   Wellington Hampshire, MD  isosorbide mononitrate (IMDUR) 60 MG 24 hr tablet Take 1 tablet (60 mg total) by mouth daily. 07/07/22   Nahser, Wonda Cheng, MD  levothyroxine (SYNTHROID, LEVOTHROID) 50 MCG tablet Take 50 mcg by mouth daily before breakfast. 03/31/16   [provider]  lisinopril (ZESTRIL) 20 MG tablet Take 1 tablet (20 mg total) by mouth daily. 07/20/22   Georgette Shell, MD  metoprolol tartrate (LOPRESSOR) 25 MG tablet Take 1 tablet (25 mg total) by mouth 2 (two) times daily. 07/19/22   Georgette Shell, MD  nitroGLYCERIN (NITROSTAT) 0.4 MG SL tablet Place 1 tablet (0.4 mg total) under the tongue every 5 (five) minutes x 3 doses as needed for chest pain. 11/09/19   Sande Rives E, PA-C  pantoprazole (PROTONIX) 40 MG tablet Take 40 mg by mouth 2 (two) times daily.    [provider]  ranolazine (RANEXA) 500 MG 12 hr tablet Take 1 tablet (500 mg total) by mouth 2 (two) times daily. 07/19/22   Georgette Shell,  MD  rOPINIRole (REQUIP) 2 MG tablet Take 2 mg by mouth at bedtime.    [provider]      Allergies    Penicillins, Lincomycin, Naproxen sodium, Statins, Crestor [rosuvastatin], Latex, Lipitor [atorvastatin], and Tape    Review of Systems   Review of Systems  Neurological:  Positive for weakness.    Physical Exam Updated Vital Signs BP 125/60   Pulse (!) 54   Temp 97.9 F (36.6 C) (Oral)   Resp 10   Ht '4\' 8"'$  (1.422 m)   Wt 53.6 kg   SpO2 100%   BMI 26.49 kg/m  Physical Exam Constitutional: Alert and oriented.  Fatigued but  nontoxic Eyes: Conjunctivae are normal. ENT      Head: Normocephalic and atraumatic.      Nose: No congestion.      Mouth/Throat: Mucous membranes are moist.      Neck: No stridor. Cardiovascular: S1, S2, warm and dry, well-perfused, O2 sat in. Respiratory: Normal respiratory effort. Breath sounds are normal.  O2 sat 96 on RA Gastrointestinal: Soft and nontender. There is no CVA tenderness. Musculoskeletal: Normal range of motion in all extremities.      Right lower leg: No tenderness or edema.      Left lower leg: No tenderness or edema. Neurologic: Normal speech and language.  No facial droop.  PERRL. EOMI. upper extremities and lower extremities equal strength bilaterally.  No sensation deficits.  No gross focal neurologic deficits are appreciated. Skin: Skin is warm, dry and intact. No rash noted. Psychiatric: Mood and affect are normal. Speech and behavior are normal.  ED Results / Procedures / Treatments   Labs (all labs ordered are listed, but only abnormal results are displayed) Labs Reviewed  CBC WITH DIFFERENTIAL/PLATELET - Abnormal; Notable for the following components:      Result Value   RBC 3.75 (*)    Hemoglobin 11.4 (*)    HCT 32.6 (*)    Platelets 411 (*)    All other components within normal limits  COMPREHENSIVE METABOLIC PANEL - Abnormal; Notable for the following components:   Sodium 124 (*)    Potassium 3.0 (*)    Chloride 87 (*)    Glucose, Bld 112 (*)    BUN 49 (*)    Creatinine, Ser 1.62 (*)    GFR, Estimated 32 (*)    All other components within normal limits  MAGNESIUM  TSH  T4, FREE  URINALYSIS, ROUTINE W REFLEX MICROSCOPIC  SODIUM, URINE, RANDOM  TROPONIN I (HIGH SENSITIVITY)  TROPONIN I (HIGH SENSITIVITY)    EKG EKG Interpretation  Date/Time:  Thursday July 28 2022 12:34:00 EDT Ventricular Rate:  52 PR Interval:  204 QRS Duration: 111 QT Interval:  474 QTC Calculation: 441 R Axis:   27 Text Interpretation: Sinus rhythm Abnormal  inferior Q waves Inferior Q waves present on previous EKG, normal QTc 441 previous EKG, , sinus bradycardia with first-degree block Confirmed by Georgina Snell [phone]) on 07/28/2022 12:37:40 PM  Radiology No results found.  Procedures Procedures  Remain on constant cardiac monitoring, sinus bradycardia  Medications Ordered in ED Medications  sodium chloride 0.9 % bolus 500 mL (0 mLs Intravenous Stopped 07/28/22 1416)  potassium chloride SA (KLOR-CON M) CR tablet 40 mEq (40 mEq Oral Given 07/28/22 1412)    ED Course/ Medical Decision Making/ A&P Clinical Course as of 07/28/22 1420  Thu Jul 28, 2022  1325 Patient has an AKI creatinine 1.62 up from 0.7 obtained 9 days  prior.  BUN to creatinine ratio 30 suggestive of prerenal injury consistent with suspicion of hypovolemic hyponatremia 124 today, hypochloremia 87, hypokalemia 3.0.  She has no fluid losses, no vomiting or diarrhea, this is all likely prerenal and decreased p.o. intake in nature.  Stable anemia hemoglobin 11.4.  Normal magnesium level.  Currently receiving IV fluids and I have ordered for 40 mEq potassium. [VB]  2585 Patient's EKG sinus bradycardia with no ischemic change no active chest pain and reassuring high sensitive troponin 8, do not suspect atypical ACS.  Since patient is so symptomatic and has not fully recovered since recent hospitalization with resulting AKI and hypovolemic hyponatremia and hypochloremia as well as hypokalemia, will page for admission for continued fluids and repletion and monitoring [VB]  1419 Discussed case with Dr. Tamala Julian hospitalist who will put in orders for patient's admission for failure to thrive, hypovolemic hyponatremia, hypokalemia and AKI. [VB]    Clinical Course User Index [VB] Elgie Congo, MD                           Medical Decision Making  Mazy Culton is a 81 y.o. female.  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  Patient presents with generalized fatigue and weakness.  She is endorsing decreased sodium and potassium levels appreciated at outside lab after follow-up with PCP yesterday.  We will repeat labs here to further evaluate for hyponatremia, hypokalemia, hypomagnesemia among multiple other etiologies of generalized fatigue and weakness.  We will also obtain thyroid labs.  Will evaluate for infection with UA.  No active chest pain or shortness of breath but will obtain screening EKG and troponin.  Initial EKG here sinus bradycardia with prolonged PR interval and normal QTc with no ischemic changes.  She has no focal deficits, no recent injuries, no concern for CVA or intracranial abnormality.  Suspect her reported abnormalities are from dehydration decreased p.o. intake and hypovolemic in nature.  She is clinically not fluid overloaded, will start with normal saline 500 cc IV fluid bolus and replete electrolyte abnormalities as necessary.  Amount and/or Complexity of Data Reviewed Labs: ordered. ECG/medicine tests: ordered.  Risk Prescription drug management. Decision regarding hospitalization.    Final Clinical Impression(s) / ED Diagnoses Final diagnoses:  Hypokalemia  Hyponatremia  Weakness  AKI (acute kidney injury) ([place])    Rx / DC Orders ED Discharge Orders     None         Elgie Congo, MD 07/28/22 1420

## 2022-07-28 NOTE — ED Notes (Signed)
Pt and family aware of pending transport to [facility]. Consent given

## 2022-07-28 NOTE — ED Notes (Signed)
ED TO INPATIENT HANDOFF REPORT  ED Nurse Name and Phone #:  Shirlean Mylar Tolulope Pinkett RN  S Name/Age/Gender Katherine Hall 81 y.o. female Room/Bed: MH05/MH05  Code Status   Code Status: Prior  Home/SNF/Other Home Patient oriented to: self, place, time, and situation Is this baseline? Yes   Triage Complete: Triage complete  Chief Complaint Failure to thrive in adult [R62.7]  Triage Note Pt was discharged from hospital last week and had a follow up with PCP yesterday. PCP called today and advised to come to ER for evaluation of Potassium and Sodium as both labs are low. Pt denies any pain but complains of dizziness.    Allergies Allergies  Allergen Reactions   Penicillins Anaphylaxis    Did it involve swelling of the face/tongue/throat, SOB, or low BP? Yes Did it involve sudden or severe rash/hives, skin peeling, or any reaction on the inside of your mouth or nose? No Did you need to seek medical attention at a hospital or doctor's office? Yes When did it last happen?      10+ years If all above answers are "NO", may proceed with cephalosporin use.     Lincomycin Swelling and Other (See Comments)    passed out   Naproxen Sodium Other (See Comments)    Stomach ulcers   Statins Other (See Comments)    Myalgias   Crestor [Rosuvastatin]     myalgias   Latex Other (See Comments)    Blisters when in contact with skin   Lipitor [Atorvastatin]     myalgias   Tape     blistering    Level of Care/Admitting Diagnosis ED Disposition     ED Disposition  Admit   Condition  --   [place]: [facility] [[id number]]  Level of Care: Telemetry Medical [104]  Interfacility transfer: Yes  May place patient in observation at [facility] or [facility] if equivalent level of care is available:: No  Covid Evaluation: Asymptomatic - no recent exposure (last 10 days) testing not required  Diagnosis: Failure to thrive in adult [358490]  Admitting Physician: Norval Morton [[id number]]  Attending Physician: Norval Morton [[id number]]          B Medical/Surgery History Past Medical History:  Diagnosis Date   Anemia    Arthritis    Coronary artery disease    s/p CABG in 2017 // Myoview 11/21: No ischemia or infarction, EF > 65, low risk  // [place] >> cath 3/22: S-RPDA w severe disease - PCI unsuccessful & c/b peri-procedure MI (acute closure w wire manipulation>>inf ST elev) >> Med Rx    Echocardiogram 02/2021    EF 65-70, no RWMA, mild LVH, Gr 1 DD, normal RVSF, RVSP 19.5, mild AI, mild AV sclerosis   Familial hypercholesterolemia    a. h/o intolerance of statins, prev on Repatha but insurance stopped covering.   Gastric ulcer    a. h/o bleeding ulcer 2009 requiring 3 pints of blood.   History of removal of cyst    a. 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   HTN (hypertension)    Hypothyroidism    PAD (peripheral artery disease) (HCC)    S/p L SFA PTA // ABIs/LE arterial US 4/22: R 0.7; L 0.86 // R SFA 75-99; L SFA angioplasty site 30-49, distal 50-74 // ectatic distal aorta 2.6 x 2.6 cm   Prediabetes    Past Surgical History:  Procedure Laterality Date  ABDOMINAL AORTOGRAM W/LOWER EXTREMITY Bilateral 11/06/2019   Procedure: ABDOMINAL AORTOGRAM W/LOWER EXTREMITY;  Surgeon: Wellington Hampshire, MD;  Location: [facility];  Service: Cardiovascular;  Laterality: Bilateral;   ABDOMINAL AORTOGRAM W/LOWER EXTREMITY N/A 03/31/2021   Procedure: ABDOMINAL AORTOGRAM W/LOWER EXTREMITY;  Surgeon: Wellington Hampshire, MD;  Location: [facility];  Service: Cardiovascular;  Laterality: N/A;   ABDOMINAL AORTOGRAM W/LOWER EXTREMITY N/A 09/15/2021   Procedure: ABDOMINAL AORTOGRAM W/LOWER EXTREMITY;  Surgeon: Wellington Hampshire, MD;  Location: [facility];  Service: Cardiovascular;  Laterality: N/A;   ABDOMINAL HYSTERECTOMY     APPENDECTOMY     CARDIAC CATHETERIZATION N/A 07/05/2016   Procedure: Left Heart Cath and  Coronary Angiography;  Surgeon: Burnell Blanks, MD;  Location: [facility];  Service: Cardiovascular;  Laterality: N/A;   CARDIAC SURGERY     cyst, not on heart.   CORONARY ARTERY BYPASS GRAFT N/A 07/07/2016   Procedure: CORONARY ARTERY BYPASS GRAFTING (CABG) x 5 with endoscopic harvesting of the right greater saphenous vein;  Surgeon: Gaye Pollack, MD;  Location: [facility] OR;  Service: Open Heart Surgery;  Laterality: N/A;   CORONARY BALLOON ANGIOPLASTY N/A 02/03/2021   Procedure: CORONARY BALLOON ANGIOPLASTY;  Surgeon: Wellington Hampshire, MD;  Location: [facility];  Service: Cardiovascular;  Laterality: N/A;  svg to pda   ESOPHAGOGASTRODUODENOSCOPY (EGD) WITH PROPOFOL N/A 05/08/2021   Procedure: ESOPHAGOGASTRODUODENOSCOPY (EGD) WITH PROPOFOL;  Surgeon: Arta Silence, MD;  Location: [facility];  Service: Endoscopy;  Laterality: N/A;   HEMORRHOID SURGERY     INTRAOPERATIVE TRANSESOPHAGEAL ECHOCARDIOGRAM N/A 07/07/2016   Procedure: INTRAOPERATIVE TRANSESOPHAGEAL ECHOCARDIOGRAM;  Surgeon: Gaye Pollack, MD;  Location: [facility] OR;  Service: Open Heart Surgery;  Laterality: N/A;   LEFT HEART CATH AND CORS/GRAFTS ANGIOGRAPHY N/A 02/03/2021   Procedure: LEFT HEART CATH AND CORS/GRAFTS ANGIOGRAPHY;  Surgeon: Wellington Hampshire, MD;  Location: [facility];  Service: Cardiovascular;  Laterality: N/A;   LEFT HEART CATH AND CORS/GRAFTS ANGIOGRAPHY N/A 07/18/2022   Procedure: LEFT HEART CATH AND CORS/GRAFTS ANGIOGRAPHY;  Surgeon: Martinique, Peter M, MD;  Location: [facility];  Service: Cardiovascular;  Laterality: N/A;   PERIPHERAL VASCULAR ATHERECTOMY Left 09/15/2021   Procedure: PERIPHERAL VASCULAR ATHERECTOMY;  Surgeon: Wellington Hampshire, MD;  Location: [facility];  Service: Cardiovascular;  Laterality: Left;   PERIPHERAL VASCULAR BALLOON ANGIOPLASTY  11/06/2019   Procedure: PERIPHERAL VASCULAR BALLOON ANGIOPLASTY;  Surgeon: Wellington Hampshire, MD;  Location: [facility];   Service: Cardiovascular;;  cutting and dcb   PERIPHERAL VASCULAR BALLOON ANGIOPLASTY Right 03/31/2021   Procedure: PERIPHERAL VASCULAR BALLOON ANGIOPLASTY;  Surgeon: Wellington Hampshire, MD;  Location: [facility];  Service: Cardiovascular;  Laterality: Right;  SFA   SHOULDER SURGERY       A IV Location/Drains/Wounds Patient Lines/Drains/Airways Status     Active Line/Drains/Airways     Name Placement date Placement time Site Days   Peripheral IV 07/28/22 20 G 1.16" Left Antecubital 07/28/22  1236  Antecubital  less than 1            Intake/Output Last 24 hours No intake or output data in the 24 hours ending 07/28/22 1621  Labs/Imaging Results for orders placed or performed during the hospital encounter of 07/28/22 (from the past 48 hour(s))  CBC with Differential     Status: Abnormal   Collection Time: 07/28/22 12:39 PM  Result Value Ref Range   WBC 8.9  4.0 - 10.5 K/uL   RBC 3.75 (L) 3.87 - 5.11 MIL/uL   Hemoglobin 11.4 (L) 12.0 - 15.0 g/dL   HCT 32.6 (L) 36.0 - 46.0 %   MCV 86.9 80.0 - 100.0 fL   MCH 30.4 26.0 - 34.0 pg   MCHC 35.0 30.0 - 36.0 g/dL   RDW 13.5 11.5 - 15.5 %   Platelets 411 (H) 150 - 400 K/uL   nRBC 0.0 0.0 - 0.2 %   Neutrophils Relative % 64 %   Neutro Abs 5.7 1.7 - 7.7 K/uL   Lymphocytes Relative 23 %   Lymphs Abs 2.0 0.7 - 4.0 K/uL   Monocytes Relative 9 %   Monocytes Absolute 0.8 0.1 - 1.0 K/uL   Eosinophils Relative 3 %   Eosinophils Absolute 0.3 0.0 - 0.5 K/uL   Basophils Relative 1 %   Basophils Absolute 0.1 0.0 - 0.1 K/uL   Immature Granulocytes 0 %   Abs Immature Granulocytes 0.02 0.00 - 0.07 K/uL    Comment: Performed at [facility], [address] [place], [place] [address]  Comprehensive metabolic panel     Status: Abnormal   Collection Time: 07/28/22 12:39 PM  Result Value Ref Range   Sodium 124 (L) 135 - 145 mmol/L   Potassium 3.0 (L) 3.5 - 5.1 mmol/L   Chloride 87 (L) 98 - 111 mmol/L   CO2 23 22 - 32 mmol/L    Glucose, Bld 112 (H) 70 - 99 mg/dL    Comment: Glucose reference range applies only to samples taken after fasting for at least 8 hours.   BUN 49 (H) 8 - 23 mg/dL   Creatinine, Ser 1.62 (H) 0.44 - 1.00 mg/dL   Calcium 9.3 8.9 - 10.3 mg/dL   Total Protein 6.9 6.5 - 8.1 g/dL   Albumin 4.2 3.5 - 5.0 g/dL   AST 18 15 - 41 U/L   ALT 11 0 - 44 U/L   Alkaline Phosphatase 44 38 - 126 U/L   Total Bilirubin 0.7 0.3 - 1.2 mg/dL   GFR, Estimated 32 (L) >60 mL/min    Comment: (NOTE) Calculated using the CKD-EPI Creatinine Equation (2021)    Anion gap 14 5 - 15    Comment: Performed at [facility], [address] [place], [place] [address]  Magnesium     Status: None   Collection Time: 07/28/22 12:39 PM  Result Value Ref Range   Magnesium 2.1 1.7 - 2.4 mg/dL    Comment: Performed at [facility], [address] [place], [place] [address]  Troponin I (High Sensitivity)     Status: None   Collection Time: 07/28/22 12:39 PM  Result Value Ref Range   Troponin I (High Sensitivity) 8 <18 ng/L    Comment: (NOTE) Elevated high sensitivity troponin I (hsTnI) values and significant  changes across serial measurements may suggest ACS but many other  chronic and acute conditions are known to elevate hsTnI results.  Refer to the "Links" section for chest pain algorithms and additional  guidance. Performed at [facility], [address] [place], [place] [address]    No results found.  Pending Labs Unresulted Labs (From admission, onward)     Start     Ordered   07/28/22 1233  TSH  Once,   URGENT        07/28/22 1233   07/28/22 1233  T4, free  Once,   URGENT  07/28/22 1233   07/28/22 1233  Urinalysis, Routine w reflex microscopic Urine, Clean Catch  Once,   URGENT        07/28/22 1233   07/28/22 1233  Sodium, urine, random  Once,   URGENT        07/28/22 1233            Vitals/Pain Today's Vitals   07/28/22 1316 07/28/22 1330 07/28/22 1400  07/28/22 1500  BP:  (!) 108/55 125/60 (!) 142/75  Pulse:  (!) 53 (!) 54 63  Resp:  '13 10 13  '$ Temp:    97.6 F (36.4 C)  TempSrc:      SpO2:  96% 100% 100%  Weight:      Height:      PainSc: 0-No pain       Isolation Precautions No active isolations  Medications Medications  sodium chloride 0.9 % bolus 500 mL (0 mLs Intravenous Stopped 07/28/22 1416)  potassium chloride SA (KLOR-CON M) CR tablet 40 mEq (40 mEq Oral Given 07/28/22 1412)    Mobility walks Moderate fall risk   Focused Assessments    R Recommendations: See Admitting Provider Note  Report given to:   Additional Notes:

## 2022-07-28 NOTE — ED Notes (Signed)
Report given to floor nurse at [facility]

## 2022-07-28 NOTE — ED Triage Notes (Signed)
Pt was discharged from hospital last week and had a follow up with PCP yesterday. PCP called today and advised to come to ER for evaluation of Potassium and Sodium as both labs are low. Pt denies any pain but complains of dizziness.

## 2022-07-28 NOTE — H&P (Signed)
History and Physical    Katherine Hall PVX:[id number] DOB: 03/20/1941 DOA: 07/28/2022  PCP: Stacie Glaze, DO (Confirm with patient/family/NH records and if not entered, this has to be entered at [facility] point of entry) Patient coming from: Home  I have personally briefly reviewed patient's old medical records in [place]  Chief Complaint: Feeling weak  HPI: Katherine Hall is a 81 y.o. female with medical history significant of multivessel CAD status post CABG, refractory HTN, HLD, hypothyroidism, PVD status post angioplasty, chronic anemia, presented with worsening of generalized weakness persistent hypotension.  Patient was recently hospitalized for angina, underwent cath 2 weeks ago, and results showed diffuse CAD but overall no significant change to previous cath 3 years ago, recommended by cardiology with medical management.  During her hospitalization, appears that her blood pressure medication were adjusted.  She used to be on HCTZ 12.5 mg, lisinopril 20 mg, Imdur 30 mg.  On last admission, she was started on Ranexa 500 twice daily, and Imdur increased to 60 mg daily.  She was discharged on 8/15, since then, patient has had several episodes of hypotension, she showed me her record which shows 2 occasions her SBP was 85 and 78.  She has been very symptomatic with lightheadedness nauseous and poor appetite.  Denies any chest pain no shortness of breath no syncope episode.  ED Course: Blood pressure on the lower side, SBP 90s, responded to 500 mL IV bolus.  Blood work showed AKI creatinine 1.6 compared to baseline 0.6, K3.0, Na 24.  Review of Systems: As per HPI otherwise 14 point review of systems negative.    Past Medical History:  Diagnosis Date   AKI (acute kidney injury) ([place]) 07/28/2022   Anemia    Arthritis    Coronary artery disease    s/p CABG in 2017 // Myoview 11/21: No ischemia or infarction, EF > 65, low risk  // [place] >> cath 3/22: S-RPDA w severe disease - PCI  unsuccessful & c/b peri-procedure MI (acute closure w wire manipulation>>inf ST elev) >> Med Rx    Echocardiogram 02/2021    EF 65-70, no RWMA, mild LVH, Gr 1 DD, normal RVSF, RVSP 19.5, mild AI, mild AV sclerosis   Familial hypercholesterolemia    a. h/o intolerance of statins, prev on Repatha but insurance stopped covering.   Gastric ulcer    a. h/o bleeding ulcer 2009 requiring 3 pints of blood.   History of removal of cyst    a. 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   HTN (hypertension)    Hypothyroidism    PAD (peripheral artery disease) (HCC)    S/p L SFA PTA // ABIs/LE arterial US 4/22: R 0.7; L 0.86 // R SFA 75-99; L SFA angioplasty site 30-49, distal 50-74 // ectatic distal aorta 2.6 x 2.6 cm   Prediabetes     Past Surgical History:  Procedure Laterality Date   ABDOMINAL AORTOGRAM W/LOWER EXTREMITY Bilateral 11/06/2019   Procedure: ABDOMINAL AORTOGRAM W/LOWER EXTREMITY;  Surgeon: Wellington Hampshire, MD;  Location: [place] [facility];  Service: Cardiovascular;  Laterality: Bilateral;   ABDOMINAL AORTOGRAM W/LOWER EXTREMITY N/A 03/31/2021   Procedure: ABDOMINAL AORTOGRAM W/LOWER EXTREMITY;  Surgeon: Wellington Hampshire, MD;  Location: [place] [facility];  Service: Cardiovascular;  Laterality: N/A;   ABDOMINAL AORTOGRAM W/LOWER EXTREMITY N/A 09/15/2021   Procedure: ABDOMINAL AORTOGRAM W/LOWER EXTREMITY;  Surgeon: Wellington Hampshire, MD;  Location: [place] [facility];  Service: Cardiovascular;  Laterality: N/A;   ABDOMINAL HYSTERECTOMY     APPENDECTOMY     CARDIAC CATHETERIZATION N/A 07/05/2016   Procedure: Left Heart Cath and Coronary Angiography;  Surgeon: Burnell Blanks, MD;  Location: [place] [facility];  Service: Cardiovascular;  Laterality: N/A;   CARDIAC SURGERY     cyst, not on heart.   CORONARY ARTERY BYPASS GRAFT N/A 07/07/2016   Procedure: CORONARY ARTERY BYPASS GRAFTING (CABG) x 5 with endoscopic harvesting of the right greater saphenous  vein;  Surgeon: Gaye Pollack, MD;  Location: [place] [facility];  Service: Open Heart Surgery;  Laterality: N/A;   CORONARY BALLOON ANGIOPLASTY N/A 02/03/2021   Procedure: CORONARY BALLOON ANGIOPLASTY;  Surgeon: Wellington Hampshire, MD;  Location: [place] [facility];  Service: Cardiovascular;  Laterality: N/A;  svg to pda   ESOPHAGOGASTRODUODENOSCOPY (EGD) WITH PROPOFOL N/A 05/08/2021   Procedure: ESOPHAGOGASTRODUODENOSCOPY (EGD) WITH PROPOFOL;  Surgeon: Arta Silence, MD;  Location: [facility];  Service: Endoscopy;  Laterality: N/A;   HEMORRHOID SURGERY     INTRAOPERATIVE TRANSESOPHAGEAL ECHOCARDIOGRAM N/A 07/07/2016   Procedure: INTRAOPERATIVE TRANSESOPHAGEAL ECHOCARDIOGRAM;  Surgeon: Gaye Pollack, MD;  Location: [place] [facility];  Service: Open Heart Surgery;  Laterality: N/A;   LEFT HEART CATH AND CORS/GRAFTS ANGIOGRAPHY N/A 02/03/2021   Procedure: LEFT HEART CATH AND CORS/GRAFTS ANGIOGRAPHY;  Surgeon: Wellington Hampshire, MD;  Location: [place] [facility];  Service: Cardiovascular;  Laterality: N/A;   LEFT HEART CATH AND CORS/GRAFTS ANGIOGRAPHY N/A 07/18/2022   Procedure: LEFT HEART CATH AND CORS/GRAFTS ANGIOGRAPHY;  Surgeon: Martinique, Peter M, MD;  Location: [place] [facility];  Service: Cardiovascular;  Laterality: N/A;   PERIPHERAL VASCULAR ATHERECTOMY Left 09/15/2021   Procedure: PERIPHERAL VASCULAR ATHERECTOMY;  Surgeon: Wellington Hampshire, MD;  Location: [place] [facility];  Service: Cardiovascular;  Laterality: Left;   PERIPHERAL VASCULAR BALLOON ANGIOPLASTY  11/06/2019   Procedure: PERIPHERAL VASCULAR BALLOON ANGIOPLASTY;  Surgeon: Wellington Hampshire, MD;  Location: [place] [facility];  Service: Cardiovascular;;  cutting and dcb   PERIPHERAL VASCULAR BALLOON ANGIOPLASTY Right 03/31/2021   Procedure: PERIPHERAL VASCULAR BALLOON ANGIOPLASTY;  Surgeon: Wellington Hampshire, MD;  Location: [place] [facility];  Service: Cardiovascular;  Laterality: Right;  SFA   SHOULDER SURGERY       reports that she has never smoked. She  has never used smokeless tobacco. She reports that she does not drink alcohol and does not use drugs.  Allergies  Allergen Reactions   Penicillins Anaphylaxis    Did it involve swelling of the face/tongue/throat, SOB, or low BP? Yes Did it involve sudden or severe rash/hives, skin peeling, or any reaction on the inside of your mouth or nose? No Did you need to seek medical attention at a hospital or doctor's office? Yes When did it last happen?      10+ years If all above answers are "NO", may proceed with cephalosporin use.     Lincomycin Swelling and Other (See Comments)    passed out   Naproxen Sodium Other (See Comments)    Stomach ulcers   Statins Other (See Comments)    Myalgias   Crestor [Rosuvastatin]     myalgias   Latex Other (See Comments)    Blisters when in contact with skin   Lipitor [Atorvastatin]     myalgias   Tape     blistering    Family History  Problem Relation Age of Onset   Stroke Mother        Stroke age  32   CAD Father        died of MI age 24   Coronary artery disease Sister        one sister had massive MI at 4, another sister has 45 stents   Coronary artery disease Brother        one brother had MI at age 57 and died at 46, another has had 4 MIs and 2 bypasses     Prior to Admission medications   Medication Sig Start Date End Date Taking? Authorizing Provider  Carboxymethylcellul-Glycerin (LUBRICATING EYE DROPS OP) Place 1 drop into both eyes daily as needed (dry eyes).    [provider]  clopidogrel (PLAVIX) 75 MG tablet Take 1 tablet (75 mg total) by mouth daily. Resume only after 5 days ( on 05/13/21) 05/08/21   Shelly Coss, MD  diphenhydramine-acetaminophen (TYLENOL PM) 25-500 MG TABS tablet Take 2 tablets by mouth at bedtime.    [provider]  Evolocumab with Infusor ([place]) 420 MG/3.5ML SOCT Inject 3.6 mLs into the skin every 30 (thirty) days. 02/28/22   Wellington Hampshire, MD  fluticasone  (FLONASE) 50 MCG/ACT nasal spray Place 1 spray into both nostrils daily as needed for allergies. 02/22/18   [provider]  hydrochlorothiazide (HYDRODIURIL) 25 MG tablet TAKE 1 TABLET BY MOUTH  DAILY 09/29/21   Wellington Hampshire, MD  isosorbide mononitrate (IMDUR) 60 MG 24 hr tablet Take 1 tablet (60 mg total) by mouth daily. 07/07/22   Nahser, Wonda Cheng, MD  levothyroxine (SYNTHROID, LEVOTHROID) 50 MCG tablet Take 50 mcg by mouth daily before breakfast. 03/31/16   [provider]  lisinopril (ZESTRIL) 20 MG tablet Take 1 tablet (20 mg total) by mouth daily. 07/20/22   Georgette Shell, MD  metoprolol tartrate (LOPRESSOR) 25 MG tablet Take 1 tablet (25 mg total) by mouth 2 (two) times daily. 07/19/22   Georgette Shell, MD  nitroGLYCERIN (NITROSTAT) 0.4 MG SL tablet Place 1 tablet (0.4 mg total) under the tongue every 5 (five) minutes x 3 doses as needed for chest pain. 11/09/19   Sande Rives E, PA-C  pantoprazole (PROTONIX) 40 MG tablet Take 40 mg by mouth 2 (two) times daily.    [provider]  ranolazine (RANEXA) 500 MG 12 hr tablet Take 1 tablet (500 mg total) by mouth 2 (two) times daily. 07/19/22   Georgette Shell, MD  rOPINIRole (REQUIP) 2 MG tablet Take 2 mg by mouth at bedtime.    [provider]    Physical Exam: Vitals:   07/28/22 1330 07/28/22 1400 07/28/22 1500 07/28/22 1732  BP: (!) 108/55 125/60 (!) 142/75 (!) 186/66  Pulse: (!) 53 (!) 54 63 67  Resp: '13 10 13 17  '$ Temp:   97.6 F (36.4 C) 97.6 F (36.4 C)  TempSrc:    Oral  SpO2: 96% 100% 100% 100%  Weight:      Height:        Constitutional: NAD, calm, comfortable Vitals:   07/28/22 1330 07/28/22 1400 07/28/22 1500 07/28/22 1732  BP: (!) 108/55 125/60 (!) 142/75 (!) 186/66  Pulse: (!) 53 (!) 54 63 67  Resp: '13 10 13 17  '$ Temp:   97.6 F (36.4 C) 97.6 F (36.4 C)  TempSrc:    Oral  SpO2: 96% 100% 100% 100%  Weight:      Height:       Eyes: PERRL, lids and  conjunctivae normal ENMT: Mucous membranes are dry.  Posterior pharynx clear of any exudate or lesions.Normal dentition.  Neck: normal, supple, no masses, no thyromegaly Respiratory: clear to auscultation bilaterally, no wheezing, no crackles. Normal respiratory effort. No accessory muscle use.  Cardiovascular: Regular rate and rhythm, no murmurs / rubs / gallops. No extremity edema. 2+ pedal pulses. No carotid bruits.  Abdomen: no tenderness, no masses palpated. No hepatosplenomegaly. Bowel sounds positive.  Musculoskeletal: no clubbing / cyanosis. No joint deformity upper and lower extremities. Good ROM, no contractures. Normal muscle tone.  Skin: no rashes, lesions, ulcers. No induration Neurologic: CN 2-12 grossly intact. Sensation intact, DTR normal. Strength 5/5 in all 4.  Psychiatric: Normal judgment and insight. Alert and oriented x 3. Normal mood.    Labs on Admission: I have personally reviewed following labs and imaging studies  CBC: Recent Labs  Lab 07/28/22 1239  WBC 8.9  NEUTROABS 5.7  HGB 11.4*  HCT 32.6*  MCV 86.9  PLT 956*   Basic Metabolic Panel: Recent Labs  Lab 07/28/22 1239  NA 124*  K 3.0*  CL 87*  CO2 23  GLUCOSE 112*  BUN 49*  CREATININE 1.62*  CALCIUM 9.3  MG 2.1   GFR: Estimated Creatinine Clearance: 18.6 mL/min (A) (by C-G formula based on SCr of 1.62 mg/dL (H)). Liver Function Tests: Recent Labs  Lab 07/28/22 1239  AST 18  ALT 11  ALKPHOS 44  BILITOT 0.7  PROT 6.9  ALBUMIN 4.2   No results for input(s): "LIPASE", "AMYLASE" in the last 168 hours. No results for input(s): "AMMONIA" in the last 168 hours. Coagulation Profile: No results for input(s): "INR", "PROTIME" in the last 168 hours. Cardiac Enzymes: No results for input(s): "CKTOTAL", "CKMB", "CKMBINDEX", "TROPONINI" in the last 168 hours. BNP (last 3 results) No results for input(s): "PROBNP" in the last 8760 hours. HbA1C: No results for input(s): "HGBA1C" in the last 72  hours. CBG: No results for input(s): "GLUCAP" in the last 168 hours. Lipid Profile: No results for input(s): "CHOL", "HDL", "LDLCALC", "TRIG", "CHOLHDL", "LDLDIRECT" in the last 72 hours. Thyroid Function Tests: Recent Labs    07/28/22 1239  FREET4 1.09   Anemia Panel: No results for input(s): "VITAMINB12", "FOLATE", "FERRITIN", "TIBC", "IRON", "RETICCTPCT" in the last 72 hours. Urine analysis:    Component Value Date/Time   COLORURINE YELLOW 07/28/2022 [place] 07/28/2022 1238   LABSPEC <=1.005 07/28/2022 1238   PHURINE 5.5 07/28/2022 1238   GLUCOSEU NEGATIVE 07/28/2022 1238   [place] 07/28/2022 [place] 07/28/2022 [place] 07/28/2022 1238   PROTEINUR NEGATIVE 07/28/2022 1238   NITRITE NEGATIVE 07/28/2022 1238   LEUKOCYTESUR SMALL (A) 07/28/2022 1238    Radiological Exams on Admission: No results found.  EKG: Independently reviewed.  Sinus, no acute ST changes.  Assessment/Plan Principal Problem:   Failure to thrive in adult Active Problems:   AKI (acute kidney injury) ([place])  (please populate well all problems here in Problem List. (For example, if patient is on BP meds at home and you resume or decide to hold them, it is a problem that needs to be her. Same for CAD, COPD, HLD and so on)  Symptomatic hypotension -Likely iatrogenic from polypharmacy/interaction of BP/CAD medications.  Will need clarification and readjustment of her BP/CAD medications.  For now, we will discontinue HCTZ, hold lisinopril due to AKI.  She probably will need a lower dose of lisinopril in the future. -Continue metoprolol 25 mg twice daily, continue Ranexa, will cut down her  Imdur to 30 mg daily.  Will decide when to restart lisinopril once her kidney function and blood pressure stabilized.  Explained to patient in detail and she expressed understanding and agreed. -As needed hydralazine -Other DDx, denies any cough, no urinary  symptoms no diarrhea, low suspicion for infection/sepsis. -PT evaluation  AKI -Likely prerenal secondary to persistent hypotension. -Management as above -We will give gentle hydration overnight as patient did respond to IV bolus in the ED initially.  Acute on chronic hyponatremia -Hypovolemic, volume contraction as above -Discontinue HCTZ  Multivessel CAD -Continue Plavix and Repatha  Chronic anemia, normocytic -H&H stable, outpatient follow-up with PCP  DVT prophylaxis: Heparin subcu Code Status: Full code Family Communication: Husband is on his way to the hospital Disposition Plan: Expect less than 2 midnight hospital stay Consults called: None Admission status: Telemetry observation   Lequita Halt MD [facility] Pager [phone]  07/28/2022, 6:03 PM

## 2022-07-28 NOTE — ED Notes (Signed)
Report given to [facility] 

## 2022-07-29 DIAGNOSIS — R627 Adult failure to thrive: Secondary | ICD-10-CM | POA: Diagnosis not present

## 2022-07-29 LAB — BASIC METABOLIC PANEL
Anion gap: 10 (ref 5–15)
BUN: 28 mg/dL — ABNORMAL HIGH (ref 8–23)
CO2: 20 mmol/L — ABNORMAL LOW (ref 22–32)
Calcium: 8.8 mg/dL — ABNORMAL LOW (ref 8.9–10.3)
Chloride: 98 mmol/L (ref 98–111)
Creatinine, Ser: 0.98 mg/dL (ref 0.44–1.00)
GFR, Estimated: 58 mL/min — ABNORMAL LOW (ref >60)
Glucose, Bld: 92 mg/dL (ref 70–99)
Potassium: 4.2 mmol/L (ref 3.5–5.1)
Sodium: 128 mmol/L — ABNORMAL LOW (ref 135–145)

## 2022-07-29 MED ORDER — ORAL CARE MOUTH RINSE: 15.0000 mL | OROMUCOSAL | Status: DC | PRN

## 2022-07-29 MED ORDER — LISINOPRIL 5 MG PO TABS: 5.0000 mg | ORAL_TABLET | Freq: Every day | ORAL | 1 refills | Status: DC

## 2022-07-29 MED ORDER — ISOSORBIDE MONONITRATE ER 30 MG PO TB24: 30.0000 mg | ORAL_TABLET | Freq: Every day | ORAL | 1 refills | Status: DC

## 2022-07-29 NOTE — Plan of Care (Signed)
  Problem: Nutrition: Goal: Adequate nutrition will be maintained Outcome: Progressing   Problem: Safety: Goal: Ability to remain free from injury will improve Outcome: Progressing   

## 2022-07-29 NOTE — Progress Notes (Signed)
Pt d/c with spouse at bedside, piv d/c, dressing applied. VS wnl, no distress

## 2022-07-29 NOTE — Discharge Summary (Signed)
Physician Discharge Summary  Katherine Hall UVO:[id number] DOB: 06/05/1941 DOA: 07/28/2022  PCP: Stacie Glaze, DO  Admit date: 07/28/2022 Discharge date: 07/29/2022  Admitted From: Home Disposition: Home  Recommendations for Outpatient Follow-up:  Follow up with PCP in 1-2 weeks Follow-up with cardiology as scheduled  Home Health: PT Equipment/Devices: None  Discharge Condition: Stable CODE STATUS: Full Diet recommendation: Low-salt low-fat cardiac diet  Brief/Interim Summary: Katherine Hall is a 81 y.o. female with medical history significant of multivessel CAD status post CABG, refractory HTN, HLD, hypothyroidism, PVD status post angioplasty, chronic anemia, presented with worsening of generalized weakness persistent hypotension.  Patient was admitted as above with generalized weakness and persistent hypotension, home medications were discontinued and reduced as below.  Lisinopril decreased to 5 mg, isosorbide decreased to 30, HCTZ discontinued.  Patient symptoms resolved after these changes and otherwise stable and agreeable for discharge home.  Close follow-up with PCP and cardiology as scheduled next week.  Creatinine resolved back to baseline, sodium improving back to baseline as well.  Repeat labs next week  Discharge Diagnoses:  Principal Problem:   Failure to thrive in adult Active Problems:   AKI (acute kidney injury) [facility])  Discharge Instructions   Allergies as of 07/29/2022       Reactions   Penicillins Anaphylaxis   Did it involve swelling of the face/tongue/throat, SOB, or low BP? Yes Did it involve sudden or severe rash/hives, skin peeling, or any reaction on the inside of your mouth or nose? No Did you need to seek medical attention at a hospital or doctor's office? Yes When did it last happen?      10+ years If all above answers are "NO", may proceed with cephalosporin use.   Lincomycin Swelling, Other (See Comments)   passed out   Naproxen Sodium Other  (See Comments)   Stomach ulcers   Statins Other (See Comments)   Myalgias   Crestor [rosuvastatin]    myalgias   Latex Other (See Comments)   Blisters when in contact with skin   Lipitor [atorvastatin]    myalgias   Tape    blistering        Medication List     STOP taking these medications    hydrochlorothiazide 25 MG tablet Commonly known as: HYDRODIURIL       TAKE these medications    acyclovir 400 MG tablet Commonly known as: ZOVIRAX Take 400 mg by mouth daily as needed (for blister outbreaks per patient).   clopidogrel 75 MG tablet Commonly known as: Plavix Take 1 tablet (75 mg total) by mouth daily. Resume only after 5 days ( on 05/13/21)   diphenhydramine-acetaminophen 25-500 MG Tabs tablet Commonly known as: TYLENOL PM Take 2 tablets by mouth at bedtime.   EYE VITAMINS & MINERALS PO Take 1 tablet by mouth 2 (two) times daily.   fluticasone 50 MCG/ACT nasal spray Commonly known as: FLONASE Place 1 spray into both nostrils daily as needed for allergies.   isosorbide mononitrate 30 MG 24 hr tablet Commonly known as: IMDUR Take 1 tablet (30 mg total) by mouth daily. What changed:  medication strength how much to take   levothyroxine 50 MCG tablet Commonly known as: SYNTHROID Take 50 mcg by mouth daily before breakfast.   lisinopril 5 MG tablet Commonly known as: ZESTRIL Take 1 tablet (5 mg total) by mouth daily. What changed:  medication strength how much to take   LUBRICATING EYE DROPS OP Place 1 drop into both eyes daily as  needed (dry eyes).   metoprolol tartrate 25 MG tablet Commonly known as: LOPRESSOR Take 1 tablet (25 mg total) by mouth 2 (two) times daily. What changed: how much to take   nitroGLYCERIN 0.4 MG SL tablet Commonly known as: NITROSTAT Place 1 tablet (0.4 mg total) under the tongue every 5 (five) minutes x 3 doses as needed for chest pain.   pantoprazole 20 MG tablet Commonly known as: PROTONIX Take 20 mg by mouth  daily.   ranolazine 500 MG 12 hr tablet Commonly known as: RANEXA Take 1 tablet (500 mg total) by mouth 2 (two) times daily.   Repatha Pushtronex System 420 MG/3.5ML Soct Generic drug: Evolocumab with Infusor Inject 3.6 mLs into the skin every 30 (thirty) days.   rOPINIRole 2 MG tablet Commonly known as: REQUIP Take 2 mg by mouth at bedtime.   traMADol 50 MG tablet Commonly known as: ULTRAM Take 50 mg by mouth daily.        Follow-up Information     Stacie Glaze, DO.   Specialty: Family Medicine Contact information: [address] [place] [address] [phone]                Allergies  Allergen Reactions   Penicillins Anaphylaxis    Did it involve swelling of the face/tongue/throat, SOB, or low BP? Yes Did it involve sudden or severe rash/hives, skin peeling, or any reaction on the inside of your mouth or nose? No Did you need to seek medical attention at a hospital or doctor's office? Yes When did it last happen?      10+ years If all above answers are "NO", may proceed with cephalosporin use.     Lincomycin Swelling and Other (See Comments)    passed out   Naproxen Sodium Other (See Comments)    Stomach ulcers   Statins Other (See Comments)    Myalgias   Crestor [Rosuvastatin]     myalgias   Latex Other (See Comments)    Blisters when in contact with skin   Lipitor [Atorvastatin]     myalgias   Tape     blistering    Consultations: None   Procedures/Studies: CARDIAC CATHETERIZATION  Result Date: 07/18/2022   RPDA lesion is 100% stenosed.   Prox LAD to Mid LAD lesion is 100% stenosed.   Mid Cx lesion is 100% stenosed.   Prox RCA lesion is 40% stenosed.   Prox RCA to Mid RCA lesion is 70% stenosed.   Dist RCA lesion is 60% stenosed.   1st Mrg lesion is 100% stenosed.   RPAV lesion is 90% stenosed.   Origin to Prox Graft lesion is 100% stenosed.   Ost LM to Mid LM lesion is 90% stenosed.   Insertion lesion is 40%  stenosed.   SVG and is normal in caliber.   SVG.   LIMA and is normal in caliber.   The graft exhibits no disease.   The graft exhibits no disease.   LV end diastolic pressure is normal. 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 Patent LIMA to the LAD Patent SVG to the first diagonal Patent sequential SVG to OM1 and OM2 Occluded SVG to the PDA Low LV EDP Plan: the SVG to PDA is known to be occluded. Otherwise no new findings compared with cardiac cath in March 2022. The other grafts are patent. The RCA has diffuse moderate to severe disease and is extremely tortuous. It is not a viable  target for PCI. Would continue medical therapy.   DG Chest Port 1 View  Result Date: 07/15/2022 CLINICAL DATA:  Chest pain EXAM: PORTABLE CHEST 1 VIEW COMPARISON:  Previous studies including the examination of 04/14/2021 FINDINGS: Cardiac size is within normal limits. Lung fields are clear of any infiltrates or pulmonary edema. There is no pleural effusion or pneumothorax. Skin folds are noted overlying the lateral aspect of lower lung fields. There is previous cardiac surgery. Degenerative changes are noted in both shoulders, much severe in the right shoulder. IMPRESSION: No active disease. Electronically Signed   By: Elmer Picker M.D.   On: 07/15/2022 16:45   NM PET CT CARDIAC PERFUSION MULTI W/ABSOLUTE BLOODFLOW  Result Date: 07/06/2022   LV perfusion is abnormal. There is evidence of ischemia. There is no evidence of infarction. Defect 1: There is a small defect with moderate reduction in uptake present in the apical to mid inferior location(s) that is reversible. There is abnormal wall motion in the defect area. Consistent with ischemia. The defect is consistent with abnormal perfusion in the RCA territory.   Rest left ventricular function is normal. Rest EF: 61 %. Stress left ventricular function is normal. Stress EF: 66 %. End diastolic cavity size is normal.   Myocardial blood flow was computed to be  1.53m/g/min at rest and 1.820mg/min at stress. Global myocardial blood flow reserve was 1.61 and was abnormal. Myocardial blood flow reserve was abnormal in the RCA distribution (1.39) consistent with the ischemia seen on the perfusion imaging. The myocardial blood flow was mildly abnormal in the LAD/LCX distributions. Due to prior CABG, MBF flow values are not expected to be normal. Quite abnormal in the RCA distribution which matches the perfusion defect.   Myocardial blood flow was computed to be 1.1666m/min at rest and 1.74m36mmin at stress. Global myocardial blood flow reserve was 1.61 and was abnormal.   Coronary calcium assessment not performed due to prior revascularization.   Findings are consistent with ischemia. The study is intermediate risk.   Electronically signed by WeslEleonore Chiquito. CLINICAL DATA:  This over-read does not include interpretation of cardiac or coronary anatomy or pathology. No PET data interpretation. The cardiac PET interpretation by the cardiologist is attached. COMPARISON:  None Available. FINDINGS: Limited view of the lung parenchyma demonstrates no suspicious nodularity. Airways are normal. Limited view of the mediastinum demonstrates no adenopathy. Esophagus normal. Limited view of the upper abdomen unremarkable. Limited view of the skeleton and chest wall demonstrates midline sternotomy. IMPRESSION: No significant extracardiac findings. Electronically Signed   By: StewSuzy Bouchard.   On: 07/05/2022 12:25    Subjective: No acute issues or events overnight   Discharge Exam: Vitals:   07/29/22 0418 07/29/22 0741  BP: (!) 113/46 121/67  Pulse: (!) 59 66  Resp:  17  Temp: 98.1 F (36.7 C) 98.4 F (36.9 C)  SpO2: 99% 100%   Vitals:   07/28/22 1958 07/29/22 0022 07/29/22 0418 07/29/22 0741  BP: (!) 114/55 (!) 102/45 (!) 113/46 121/67  Pulse: 68 (!) 56 (!) 59 66  Resp: '18 17  17  '$ Temp: 97.7 F (36.5 C) 98.1 F (36.7 C) 98.1 F (36.7 C) 98.4 F (36.9 C)   TempSrc: Oral Oral Oral Oral  SpO2: 100% 100% 99% 100%  Weight:      Height:        General: Pt is alert, awake, not in acute distress Cardiovascular: RRR, S1/S2 +, no rubs, no gallops Respiratory: CTA bilaterally, no wheezing,  no rhonchi Abdominal: Soft, NT, ND, bowel sounds + Extremities: no edema, no cyanosis   The results of significant diagnostics from this hospitalization (including imaging, microbiology, ancillary and laboratory) are listed below for reference.     Microbiology: No results found for this or any previous visit (from the past 240 hour(s)).   Labs: BNP (last 3 results) No results for input(s): "BNP" in the last 8760 hours. Basic Metabolic Panel: Recent Labs  Lab 07/28/22 1239 07/28/22 1824 07/29/22 0109  NA 124*  --  128*  K 3.0*  --  4.2  CL 87*  --  98  CO2 23  --  20*  GLUCOSE 112*  --  92  BUN 49*  --  28*  CREATININE 1.62*  --  0.98  CALCIUM 9.3  --  8.8*  MG 2.1 2.0  --   PHOS  --  3.1  --    Liver Function Tests: Recent Labs  Lab 07/28/22 1239  AST 18  ALT 11  ALKPHOS 44  BILITOT 0.7  PROT 6.9  ALBUMIN 4.2   No results for input(s): "LIPASE", "AMYLASE" in the last 168 hours. No results for input(s): "AMMONIA" in the last 168 hours. CBC: Recent Labs  Lab 07/28/22 1239  WBC 8.9  NEUTROABS 5.7  HGB 11.4*  HCT 32.6*  MCV 86.9  PLT 411*   Cardiac Enzymes: No results for input(s): "CKTOTAL", "CKMB", "CKMBINDEX", "TROPONINI" in the last 168 hours. BNP: Invalid input(s): "POCBNP" CBG: No results for input(s): "GLUCAP" in the last 168 hours. D-Dimer No results for input(s): "DDIMER" in the last 72 hours. Hgb A1c No results for input(s): "HGBA1C" in the last 72 hours. Lipid Profile No results for input(s): "CHOL", "HDL", "LDLCALC", "TRIG", "CHOLHDL", "LDLDIRECT" in the last 72 hours. Thyroid function studies Recent Labs    07/28/22 1239  TSH 1.109   Anemia work up No results for input(s): "VITAMINB12", "FOLATE",  "FERRITIN", "TIBC", "IRON", "RETICCTPCT" in the last 72 hours. Urinalysis    Component Value Date/Time   COLORURINE YELLOW 07/28/2022 1238   APPEARANCEUR CLEAR 07/28/2022 1238   LABSPEC <=1.005 07/28/2022 1238   PHURINE 5.5 07/28/2022 1238   GLUCOSEU NEGATIVE 07/28/2022 1238   [place] 07/28/2022 [place] 07/28/2022 1238   [place] 07/28/2022 1238   PROTEINUR NEGATIVE 07/28/2022 1238   NITRITE NEGATIVE 07/28/2022 1238   LEUKOCYTESUR SMALL (A) 07/28/2022 1238   Sepsis Labs Recent Labs  Lab 07/28/22 1239  WBC 8.9   Microbiology No results found for this or any previous visit (from the past 240 hour(s)).  Time coordinating discharge: Over 30 minutes  SIGNED:  Little Ishikawa, DO [facility] 07/29/2022, 2:07 PM Pager   If 7PM-7AM, please contact night-coverage [email or web address]

## 2022-07-29 NOTE — TOC Progression Note (Signed)
Transition of Care [facility]) - Progression Note    Patient Details  Name: Katherine Hall MRN: [id number] Date of Birth: 21-Feb-1941  Transition of Care [facility]) CM/SW [place], RN Phone [phone]  07/29/2022, 12:02 PM  Clinical Narrative:     Transition of Care (TOC) Screening Note   Patient Details  Name: Katherine Hall Date of Birth: [birth year]   Transition of Care [facility]) CM/SW Contact:    Angelita Ingles, RN Phone Number: 07/29/2022, 12:02 PM    Transition of Care Department [facility]) has reviewed patient and no TOC needs have been identified at this time. We will continue to monitor patient advancement through interdisciplinary progression rounds. If new patient transition needs arise, please place a TOC consult.          Expected Discharge Plan and Services                                                 Social Determinants of Health (SDOH) Interventions    Readmission Risk Interventions     No data to display

## 2022-07-29 NOTE — Evaluation (Signed)
Physical Therapy Evaluation Patient Details Name: Katherine Hall MRN: [id number] DOB: [birth year] Today's Date: 07/29/2022  History of Present Illness  81 y/o female presented to ED on 07/28/22 for generalized weakness and hypotension. Recently admitted 8/11-8/15 for angina and underwent cath. Admitted for failure to thrive and AKI. PMH: CAD s/p CABG, HTN, PVD s/p angioplasty, chronic anemia.  Clinical Impression  Patient admitted with the above. PTA, patient lives with husband and recently requiring assistance from husband and use of RW for mobility due to weakness. Patient presents with weakness, impaired balance, and decreased activity tolerance. Patient ambulated 200' with min guard and use of RW. Patient generally unsteady but no overt LOB noted. Patient will benefit from skilled PT services during acute stay to address listed deficits. Recommend HHPT at discharge to maximize functional independence, however patient may decline.        Recommendations for follow up therapy are one component of a multi-disciplinary discharge planning process, led by the attending physician.  Recommendations may be updated based on patient status, additional functional criteria and insurance authorization.  Follow Up Recommendations Home health PT (patient may decline)      Assistance Recommended at Discharge Intermittent Supervision/Assistance  Patient can return home with the following  A little help with walking and/or transfers;Help with stairs or ramp for entrance;Assist for transportation    Equipment Recommendations None recommended by PT  Recommendations for Other Services       Functional Status Assessment Patient has had a recent decline in their functional status and demonstrates the ability to make significant improvements in function in a reasonable and predictable amount of time.     Precautions / Restrictions Precautions Precautions: Fall Restrictions Weight Bearing Restrictions: No       Mobility  Bed Mobility Overal bed mobility: Modified Independent                  Transfers Overall transfer level: Needs assistance Equipment used: Rolling Griselda Bramblett (2 wheels) Transfers: Sit to/from Stand Sit to Stand: Supervision           General transfer comment: supervision for safety    Ambulation/Gait Ambulation/Gait assistance: Min guard Gait Distance (Feet): 200 Feet Assistive device: Rolling Nyisha Clippard (2 wheels) Gait Pattern/deviations: Step-through pattern, Decreased stride length Gait velocity: decreased     General Gait Details: min guard for safety. General unsteadiness but no overt LOB  Stairs            Wheelchair Mobility    Modified Rankin (Stroke Patients Only)       Balance Overall balance assessment: Needs assistance, History of Falls Sitting-balance support: No upper extremity supported, Feet supported Sitting balance-Leahy Scale: Good     Standing balance support: Bilateral upper extremity supported, Reliant on assistive device for balance Standing balance-Leahy Scale: Poor Standing balance comment: reliant on UE support                             Pertinent Vitals/Pain Pain Assessment Pain Assessment: No/denies pain    Home Living Family/patient expects to be discharged to:: Private residence Living Arrangements: Spouse/significant other Available Help at Discharge: Family Type of Home: House Home Access: Stairs to enter   Technical brewer of Steps: 1   Home Layout: One level Home Equipment: Conservation officer, nature (2 wheels)      Prior Function Prior Level of Function : Independent/Modified Independent;History of Falls (last six months)  Mobility Comments: recently began using RW for 1 week due to unsteadiness, otherwise was independent. Reports 1 fall that occurred this past week       Hand Dominance        Extremity/Trunk Assessment   Upper Extremity Assessment Upper Extremity  Assessment: Generalized weakness    Lower Extremity Assessment Lower Extremity Assessment: Generalized weakness    Cervical / Trunk Assessment Cervical / Trunk Assessment: Kyphotic  Communication   Communication: HOH  Cognition Arousal/Alertness: Awake/alert Behavior During Therapy: WFL for tasks assessed/performed Overall Cognitive Status: No family/caregiver present to determine baseline cognitive functioning                                 General Comments: seems WFL for basic mobility tasks        General Comments      Exercises     Assessment/Plan    PT Assessment Patient needs continued PT services  PT Problem List Decreased strength;Decreased balance;Decreased activity tolerance;Decreased mobility;Decreased safety awareness       PT Treatment Interventions DME instruction;Gait training;Therapeutic activities;Therapeutic exercise;Balance training;Functional mobility training;Patient/family education    PT Goals (Current goals can be found in the Care Plan section)  Acute Rehab PT Goals Patient Stated Goal: to go home PT Goal Formulation: With patient Time For Goal Achievement: 08/12/22 Potential to Achieve Goals: Good    Frequency Min 3X/week     Co-evaluation               AM-PAC PT "6 Clicks" Mobility  Outcome Measure Help needed turning from your back to your side while in a flat bed without using bedrails?: None Help needed moving from lying on your back to sitting on the side of a flat bed without using bedrails?: None Help needed moving to and from a bed to a chair (including a wheelchair)?: A Little Help needed standing up from a chair using your arms (e.g., wheelchair or bedside chair)?: A Little Help needed to walk in hospital room?: A Little Help needed climbing 3-5 steps with a railing? : A Little 6 Click Score: 20    End of Session Equipment Utilized During Treatment: Gait belt Activity Tolerance: Patient tolerated  treatment well Patient left: in chair;with call bell/phone within reach Nurse Communication: Mobility status PT Visit Diagnosis: Unsteadiness on feet (R26.81);Muscle weakness (generalized) (M62.81);History of falling (Z91.81);Difficulty in walking, not elsewhere classified (R26.2)    Time: 5170-0174 PT Time Calculation (min) (ACUTE ONLY): 22 min   Charges:   PT Evaluation $PT Eval Low Complexity: 1 Low          Nehemias Sauceda A. Gilford Rile PT, DPT Acute Rehabilitation Services Office [phone]   Linna Hoff 07/29/2022, 12:18 PM

## 2022-07-31 NOTE — Progress Notes (Unsigned)
Office Visit    Patient Name: Katherine Hall Date of Encounter: 08/01/2022  PCP:  Katherine Hall, [facility]  Cardiologist:  Katherine Moores, MD  Advanced Practice Provider:  No care team member to display Electrophysiologist:  None   HPI    Katherine Hall is a 81 y.o. female with past medical history significant for CAD status post CABG x5 (LIMA to LAD, SVG to diagonal, SVG to PDA, sequential SVG to OM1 and OM 2), hypertension, hyperlipidemia, anemia presents today for hospital follow-up.  The patient has been followed by Dr. Acie Hall since her CABG in 2017.  She has not history of noncompliance with medications.  She was being seen in the lipid clinic by Dr. Debara Hall to try to get her on a higher dose of Repatha that was covered by her insurance.  When she was last seen May 2023 she was endorsing an unusual sensation in her right leg that started several weeks ago.  She was also having some chest twinges and chest heaviness.  She also reported having a reaction to Repatha which involved nausea and vomiting.  A cardiac PET scan was ordered for further evaluation.  There was evidence of ischemia.  She ultimately underwent cardiac catheterization which showed the SVG to PDA graft which was noted to be occluded.  No other new finding since her last cardiac catheterization March 2022.  RCA had diffuse moderate to severe disease however extremely torturous and would not be a viable target for PCI.  Recommended continued medical therapy.  Today, she states she is with symptoms she states he does feel little foggy at times she states her balance profile most recently.  Reviewed her most recent lipid panel which was done about 10 days ago.  Lipoprotein a was elevated at 242 and LDL was 180.  She was recently started on Repatha discussed repeating a lipid panel and lipoprotein a in about 2 months.  She has been compliant with all her medications and has not had any issues.  Her  blood pressure is low normal she does present with a blood pressure log and it has been low at times and given her symptoms we discussed decreasing her lisinopril.  Reports no shortness of breath nor dyspnea on exertion.  No edema, orthopnea, PND. Reports no palpitations.    Past Medical History    Past Medical History:  Diagnosis Date   AKI (acute kidney injury) ([place]) 07/28/2022   Anemia    Arthritis    Coronary artery disease    s/p CABG in 2017 // Myoview 11/21: No ischemia or infarction, EF > 65, low risk  // [place] >> cath 3/22: S-RPDA w severe disease - PCI unsuccessful & c/b peri-procedure MI (acute closure w wire manipulation>>inf ST elev) >> Med Rx    Echocardiogram 02/2021    EF 65-70, no RWMA, mild LVH, Gr 1 DD, normal RVSF, RVSP 19.5, mild AI, mild AV sclerosis   Familial hypercholesterolemia    a. h/o intolerance of statins, prev on Repatha but insurance stopped covering.   Gastric ulcer    a. h/o bleeding ulcer 2009 requiring 3 pints of blood.   History of removal of cyst    a. 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   HTN (hypertension)    Hypothyroidism    PAD (peripheral artery disease) (HCC)    S/p L SFA PTA // ABIs/LE arterial US 4/22: R 0.7; L 0.86 //  R SFA 75-99; L SFA angioplasty site 30-49, distal 50-74 // ectatic distal aorta 2.6 x 2.6 cm   Prediabetes    Past Surgical History:  Procedure Laterality Date   ABDOMINAL AORTOGRAM W/LOWER EXTREMITY Bilateral 11/06/2019   Procedure: ABDOMINAL AORTOGRAM W/LOWER EXTREMITY;  Surgeon: Wellington Hampshire, MD;  Location: [facility];  Service: Cardiovascular;  Laterality: Bilateral;   ABDOMINAL AORTOGRAM W/LOWER EXTREMITY N/A 03/31/2021   Procedure: ABDOMINAL AORTOGRAM W/LOWER EXTREMITY;  Surgeon: Wellington Hampshire, MD;  Location: [place] [facility];  Service: Cardiovascular;  Laterality: N/A;   ABDOMINAL AORTOGRAM W/LOWER EXTREMITY N/A 09/15/2021   Procedure: ABDOMINAL AORTOGRAM W/LOWER  EXTREMITY;  Surgeon: Wellington Hampshire, MD;  Location: [place];  Service: Cardiovascular;  Laterality: N/A;   ABDOMINAL HYSTERECTOMY     APPENDECTOMY     CARDIAC CATHETERIZATION N/A 07/05/2016   Procedure: Left Heart Cath and Coronary Angiography;  Surgeon: Burnell Blanks, MD;  Location: [place] [facility];  Service: Cardiovascular;  Laterality: N/A;   CARDIAC SURGERY     cyst, not on heart.   CORONARY ARTERY BYPASS GRAFT N/A 07/07/2016   Procedure: CORONARY ARTERY BYPASS GRAFTING (CABG) x 5 with endoscopic harvesting of the right greater saphenous vein;  Surgeon: Gaye Pollack, MD;  Location: [facility];  Service: Open Heart Surgery;  Laterality: N/A;   CORONARY BALLOON ANGIOPLASTY N/A 02/03/2021   Procedure: CORONARY BALLOON ANGIOPLASTY;  Surgeon: Wellington Hampshire, MD;  Location: [facility];  Service: Cardiovascular;  Laterality: N/A;  svg to pda   ESOPHAGOGASTRODUODENOSCOPY (EGD) WITH PROPOFOL N/A 05/08/2021   Procedure: ESOPHAGOGASTRODUODENOSCOPY (EGD) WITH PROPOFOL;  Surgeon: Arta Silence, MD;  Location: [facility];  Service: Endoscopy;  Laterality: N/A;   HEMORRHOID SURGERY     INTRAOPERATIVE TRANSESOPHAGEAL ECHOCARDIOGRAM N/A 07/07/2016   Procedure: INTRAOPERATIVE TRANSESOPHAGEAL ECHOCARDIOGRAM;  Surgeon: Gaye Pollack, MD;  Location: [facility];  Service: Open Heart Surgery;  Laterality: N/A;   LEFT HEART CATH AND CORS/GRAFTS ANGIOGRAPHY N/A 02/03/2021   Procedure: LEFT HEART CATH AND CORS/GRAFTS ANGIOGRAPHY;  Surgeon: Wellington Hampshire, MD;  Location: [facility];  Service: Cardiovascular;  Laterality: N/A;   LEFT HEART CATH AND CORS/GRAFTS ANGIOGRAPHY N/A 07/18/2022   Procedure: LEFT HEART CATH AND CORS/GRAFTS ANGIOGRAPHY;  Surgeon: Martinique, Peter M, MD;  Location: [place];  Service: Cardiovascular;  Laterality: N/A;   PERIPHERAL VASCULAR ATHERECTOMY Left 09/15/2021   Procedure: PERIPHERAL VASCULAR ATHERECTOMY;  Surgeon: Wellington Hampshire, MD;  Location: [facility];  Service: Cardiovascular;  Laterality: Left;   PERIPHERAL VASCULAR BALLOON ANGIOPLASTY  11/06/2019   Procedure: PERIPHERAL VASCULAR BALLOON ANGIOPLASTY;  Surgeon: Wellington Hampshire, MD;  Location: [facility];  Service: Cardiovascular;;  cutting and dcb   PERIPHERAL VASCULAR BALLOON ANGIOPLASTY Right 03/31/2021   Procedure: PERIPHERAL VASCULAR BALLOON ANGIOPLASTY;  Surgeon: Wellington Hampshire, MD;  Location: [facility] [facility];  Service: Cardiovascular;  Laterality: Right;  SFA   SHOULDER SURGERY      Allergies  Allergies  Allergen Reactions   Penicillins Anaphylaxis    Did it involve swelling of the face/tongue/throat, SOB, or low BP? Yes Did it involve sudden or severe rash/hives, skin peeling, or any reaction on the inside of your mouth or nose? No Did you need to seek medical attention at a hospital or doctor's office? Yes When did it last happen?      10+ years If all above answers are "NO", may proceed with cephalosporin  use.     Lincomycin Swelling and Other (See Comments)    passed out   Naproxen Sodium Other (See Comments)    Stomach ulcers   Statins Other (See Comments)    Myalgias   Crestor [Rosuvastatin]     myalgias   Latex Other (See Comments)    Blisters when in contact with skin   Lipitor [Atorvastatin]     myalgias   Tape     blistering    EKGs/Labs/Other Studies Reviewed:   The following studies were reviewed today:  Cardiac PET 07/05/22    LV perfusion is abnormal. There is evidence of ischemia. There is no evidence of infarction. Defect 1: There is a small defect with moderate reduction in uptake present in the apical to mid inferior location(s) that is reversible. There is abnormal wall motion in the defect area. Consistent with ischemia. The defect is consistent with abnormal perfusion in the RCA territory.   Rest left ventricular function is normal. Rest EF: 61 %. Stress left ventricular function is normal. Stress EF: 66 %. End  diastolic cavity size is normal.   Myocardial blood flow was computed to be 1.66m/g/min at rest and 1.869mg/min at stress. Global myocardial blood flow reserve was 1.61 and was abnormal. Myocardial blood flow reserve was abnormal in the RCA distribution (1.39) consistent with the ischemia seen on the perfusion imaging. The myocardial blood flow was mildly abnormal in the LAD/LCX distributions. Due to prior CABG, MBF flow values are not expected to be normal. Quite abnormal in the RCA distribution which matches the perfusion defect.   Myocardial blood flow was computed to be 1.1662m/min at rest and 1.63m73mmin at stress. Global myocardial blood flow reserve was 1.61 and was abnormal.   Coronary calcium assessment not performed due to prior revascularization.   Findings are consistent with ischemia. The study is intermediate risk.   Electronically signed by WeslEleonore Chiquito.  Cardiac Cath 07/18/22  Left Main  There is severe diffuse disease throughout the vessel.  Ost LM to Mid LM lesion is 90% stenosed.    Left Anterior Descending  Prox LAD to Mid LAD lesion is 100% stenosed.    Left Circumflex  Mid Cx lesion is 100% stenosed.    First Obtuse Marginal Branch  1st Mrg lesion is 100% stenosed.    Right Coronary Artery  Prox RCA lesion is 40% stenosed.  Prox RCA to Mid RCA lesion is 70% stenosed.  Dist RCA lesion is 60% stenosed.    Right Posterior Descending Artery  RPDA lesion is 100% stenosed.    Right Posterior Atrioventricular Artery  RPAV lesion is 90% stenosed.    Saphenous Graft To 3rd Mrg  SVG and is normal in caliber. The graft exhibits no disease.    Y-graft Branch To 1st Mrg    Saphenous Graft To 2nd Diag  SVG.  Insertion lesion is 40% stenosed.    LIMA LIMA Graft To Dist LAD  LIMA and is normal in caliber. The graft exhibits no disease.    Graft To RPDA  Origin to Prox Graft lesion is 100% stenosed.    Intervention   No interventions have been  documented.   Left Heart  Left Ventricle LV end diastolic pressure is normal.   Coronary Diagrams  Diagnostic Dominance: Right  Intervention     EKG:  EKG is not ordered today.   Recent Labs: 07/28/2022: ALT 11; Hemoglobin 11.4; Magnesium 2.0; Platelets 411; TSH 1.109 07/29/2022: BUN 28; Creatinine, Ser 0.98; Potassium  4.2; Sodium 128  Recent Lipid Panel    Component Value Date/Time   CHOL 262 (H) 07/18/2022 0553   CHOL 285 (H) 04/21/2022 0922   TRIG 146 07/18/2022 0553   HDL 53 07/18/2022 0553   HDL 55 04/21/2022 0922   CHOLHDL 4.9 07/18/2022 0553   VLDL 29 07/18/2022 0553   LDLCALC 180 (H) 07/18/2022 0553   LDLCALC 184 (H) 04/21/2022 0922    Home Medications   Current Meds  Medication Sig   acyclovir (ZOVIRAX) 400 MG tablet Take 400 mg by mouth daily as needed (for blister outbreaks per patient).   Carboxymethylcellul-Glycerin (LUBRICATING EYE DROPS OP) Place 1 drop into both eyes daily as needed (dry eyes).   clopidogrel (PLAVIX) 75 MG tablet Take 1 tablet (75 mg total) by mouth daily. Resume only after 5 days ( on 05/13/21)   diphenhydramine-acetaminophen (TYLENOL PM) 25-500 MG TABS tablet Take 2 tablets by mouth at bedtime.   Evolocumab with Infusor ([place]) 420 MG/3.5ML SOCT Inject 3.6 mLs into the skin every 30 (thirty) days.   fluticasone (FLONASE) 50 MCG/ACT nasal spray Place 1 spray into both nostrils daily as needed for allergies.   isosorbide mononitrate (IMDUR) 30 MG 24 hr tablet Take 1 tablet (30 mg total) by mouth daily.   levothyroxine (SYNTHROID, LEVOTHROID) 50 MCG tablet Take 50 mcg by mouth daily before breakfast.   lisinopril (ZESTRIL) 2.5 MG tablet Take 1 tablet (2.5 mg total) by mouth daily.   metoprolol tartrate (LOPRESSOR) 25 MG tablet Take 1 tablet (25 mg total) by mouth 2 (two) times daily.   Multiple Vitamins-Minerals (EYE VITAMINS & MINERALS PO) Take 1 tablet by mouth 2 (two) times daily.   nitroGLYCERIN (NITROSTAT) 0.4 MG SL  tablet Place 1 tablet (0.4 mg total) under the tongue every 5 (five) minutes x 3 doses as needed for chest pain.   pantoprazole (PROTONIX) 20 MG tablet Take 20 mg by mouth daily.   ranolazine (RANEXA) 500 MG 12 hr tablet Take 1 tablet (500 mg total) by mouth 2 (two) times daily.   rOPINIRole (REQUIP) 2 MG tablet Take 2 mg by mouth at bedtime.   traMADol (ULTRAM) 50 MG tablet Take 50 mg by mouth daily.   [DISCONTINUED] lisinopril (ZESTRIL) 5 MG tablet Take 1 tablet (5 mg total) by mouth daily.     Review of Systems      All other systems reviewed and are otherwise negative except as noted above.  Physical Exam    VS:  BP 110/70 (BP Location: Left Arm, Patient Position: Sitting, Cuff Size: Normal)   Pulse 67   Ht '4\' 8"'$  (1.422 m)   Wt 119 lb (54 kg)   SpO2 97%   BMI 26.68 kg/m  , BMI Body mass index is 26.68 kg/m.  Wt Readings from Last 3 Encounters:  08/01/22 119 lb (54 kg)  07/28/22 118 lb 2.7 oz (53.6 kg)  07/19/22 118 lb 4.8 oz (53.7 kg)     GEN: Well nourished, well developed, in no acute distress. HEENT: normal. Neck: Supple, no JVD, carotid bruits, or masses. Cardiac: RRR, no murmurs, rubs, or gallops. No clubbing, cyanosis, edema.  Radials/PT 2+ and equal bilaterally.  Respiratory:  Respirations regular and unlabored, clear to auscultation bilaterally. GI: Soft, nontender, nondistended. MS: No deformity or atrophy. Skin: Warm and dry, no rash. Neuro:  Strength and sensation are intact. Psych: Normal affect.  Assessment & Plan    CAD -no pain but twinges every other day but has  not needed nitro -Recent cardiac cath showed her RCA tortuous nature of the vessel no PCI was able to be performed -Continue GDMT: Plavix 75 mg daily, Repatha 420 mg injection every 2 weeks, Imdur 30 mg daily, lisinopril 2.5 mg daily, metoprolol tartrate 25 mg twice daily, nitroglycerin sublingual as needed, Ranexa 500 mg twice daily  Hyperlipidemia -Most recent lipoprotein a 242, LDL  180 -Recently started on Repatha -We will update a lipid panel in 2 months  PAD -right leg hurts all the time -seen Dr. Fletcher Anon and did not qualify for any intervention -Continue current medication regimen  Hypertension/Orthostatic hypotension -decrease lisinopril 2.'5mg'$  daily  -Please take your blood pressure daily an hour after morning medications.  Please either MyChart or call with these values  Fatigue -been worse this past month, likely multifactorial -Encouraged a healthy lipid-lowering diet -Discussed incorporating an electrolyte drink and once a day and maintain hydration         Disposition: Follow up 3 months with Katherine Moores, MD or APP.  Signed, Elgie Collard, PA-C 08/01/2022, 10:48 AM [place]

## 2022-08-01 ENCOUNTER — Encounter: Payer: Self-pay | Admitting: Physician Assistant

## 2022-08-01 ENCOUNTER — Ambulatory Visit: Payer: Medicare Other | Attending: Physician Assistant | Admitting: Physician Assistant

## 2022-08-01 VITALS — BP 110/70 | HR 67 | Ht <= 58 in | Wt 119.0 lb

## 2022-08-01 DIAGNOSIS — E7800 Pure hypercholesterolemia, unspecified: Secondary | ICD-10-CM

## 2022-08-01 DIAGNOSIS — I10 Essential (primary) hypertension: Secondary | ICD-10-CM | POA: Diagnosis not present

## 2022-08-01 DIAGNOSIS — I25118 Atherosclerotic heart disease of native coronary artery with other forms of angina pectoris: Secondary | ICD-10-CM | POA: Diagnosis not present

## 2022-08-01 DIAGNOSIS — I739 Peripheral vascular disease, unspecified: Secondary | ICD-10-CM | POA: Diagnosis not present

## 2022-08-01 DIAGNOSIS — R5383 Other fatigue: Secondary | ICD-10-CM

## 2022-08-01 MED ORDER — LISINOPRIL 2.5 MG PO TABS: 2.5000 mg | ORAL_TABLET | Freq: Every day | ORAL | 3 refills | Status: DC

## 2022-08-01 NOTE — Patient Instructions (Addendum)
Medication Instructions:  Your physician has recommended you make the following change in your medication:  1.Decrease lisinopril to 2.5 mg daily  *If you need a refill on your cardiac medications before your next appointment, please call your pharmacy*   Lab Work: Fasting lipid panel and lipoprotein A in 2 months If you have labs (blood work) drawn today and your tests are completely normal, you will receive your results only by: [facility] (if you have MyChart) OR A paper copy in the mail If you have any lab test that is abnormal or we need to change your treatment, we will call you to review the results.  Follow-Up: At [facility], you and your health needs are our priority.  As part of our continuing mission to provide you with exceptional heart care, we have created designated Provider Care Teams.  These Care Teams include your primary Cardiologist (physician) and Advanced Practice Providers (APPs -  Physician Assistants and Nurse Practitioners) who all work together to provide you with the care you need, when you need it.  Your next appointment:   3 month(s)  The format for your next appointment:   In Person  Provider:   Mertie Moores, MD    Other Instructions Check your blood pressure daily one hour after taking your morning medications, keep a log and send [place] the reading via MyChart or call us  Important Information About Sugar

## 2022-08-15 ENCOUNTER — Telehealth: Payer: Self-pay | Admitting: Cardiovascular Disease

## 2022-08-15 NOTE — Telephone Encounter (Signed)
Pt called back to address message received regarding Lisinopril 2.5 mg.     Pt states that she has been taking this once daily as ordered, but has felt nauseous, and has vomited.  She also states her headaches have increased, and has experienced intermittent dizziness.    Pt believes it is the lisinopril, and wants to has Dr. Acie Fredrickson if there is another medication option?  I will forward message to find out what Dr. Acie Fredrickson / team think.

## 2022-08-15 NOTE — Telephone Encounter (Signed)
Per last OV on 8/28:  Her blood pressure is low normal she does present with a blood pressure log and it has been low at times and given her symptoms we discussed decreasing her lisinopril. Patient states she takes her medications about 10am and PM doses between 5-6pm. She has still been having dizziness, nausea, headaches, and vomiting. Had recent dental surgery as well.  BP Log provided:  9/1 144/75 (8AM)  (no PM reading) 9/2 125/63 (3AM)*before meds  124/62(PM) 9/3 146/79 (10AM)  112/54 (6PM) 9/4 145/77 (8AM)    122/62 (8PM) 9/5  114/56 (3PM)    106/58 (10 PM) 9/6 138/70 (11AM)   120/62 (11PM) 9/7 126/69 (6AM)  (no PM reading) 9/8 140/73 (8:30 AM)  142/74 (6PM) 9/9 144/68 (9PM only check) 9/10 159/89 (1PM)   132/65(7PM) 9/11  149/87 (9AM)   159/83 (3:30PM) *states she has felt better today  She states that all she's eating now at all since leaving the hospital is lima beans, turnip greens, orange sherbet, sweet potatoes. She buys these from a [place] near her house, but states that she questioned them about added salt or fatback and they denied using them. She confirms she's taking Lopressor '25mg'$  QAM & QPM, and Lisinopril 2.5 QAM. States no changes in bowels. Has increased fatigue during the day and is now sleeping several hours in her recliner during the day. She condones feeling the same symptoms as when she saw Tessa on 8/28, but feels those symptoms are worsening. I told her that this is not likely related to her BP at all, since the readings are consistent with what she was prior to decreasing the Lisinopril. Pt had labs done on 07/29/22 while admitted and her Na+ level was 124, then repeat next day=128. Hyponatremia can cause every symptom she's experiencing, so may be that. Informed patient that I will route to MD for review and recommendation and I would call her back.

## 2022-08-15 NOTE — Telephone Encounter (Signed)
Pt c/o medication issue:  1. Name of Medication: lisinopril (ZESTRIL) 2.5 MG tablet  2. How are you currently taking this medication (dosage and times per day)? Take 1 tablet (2.5 mg total) by mouth daily.  3. Are you having a reaction (difficulty breathing--STAT)? no  4. What is your medication issue? Patient stated she still having issues with the lower dosage of medication.

## 2022-08-16 ENCOUNTER — Other Ambulatory Visit (HOSPITAL_COMMUNITY): Payer: Self-pay

## 2022-08-16 NOTE — Telephone Encounter (Signed)
Nahser, Wonda Cheng, MD  Donnalee Curry K Caller: Unspecified (Yesterday,  8:41 AM) I agree with the note by Donnalee Curry, RN that her symptoms are not related to her blood pressure fluctuations.  I am concerned about her hyponatremia. Her diet sounds incomplete.  She needs more protein including lean chicken, fish.  She should discuss her diet with her primary medical doctor and also have her hyponatremia followed up    Changing blood pressure medicines is not likely to help any of her symptoms or significantly change her blood pressure readings.  PN   Left detailed message for patient per DPR. Copy of labs sent to PCP for follow-up.

## 2022-09-06 ENCOUNTER — Ambulatory Visit: Payer: Medicare Other | Attending: Cardiovascular Disease | Admitting: Cardiovascular Disease

## 2022-09-06 ENCOUNTER — Encounter: Payer: Self-pay | Admitting: Cardiovascular Disease

## 2022-09-06 VITALS — BP 160/84 | HR 64 | Ht <= 58 in | Wt 116.8 lb

## 2022-09-06 DIAGNOSIS — E7801 Familial hypercholesterolemia: Secondary | ICD-10-CM

## 2022-09-06 DIAGNOSIS — E785 Hyperlipidemia, unspecified: Secondary | ICD-10-CM | POA: Diagnosis not present

## 2022-09-06 DIAGNOSIS — I10 Essential (primary) hypertension: Secondary | ICD-10-CM

## 2022-09-06 DIAGNOSIS — I25118 Atherosclerotic heart disease of native coronary artery with other forms of angina pectoris: Secondary | ICD-10-CM | POA: Diagnosis not present

## 2022-09-06 NOTE — Patient Instructions (Signed)
Medication Instructions:  No changes *If you need a refill on your cardiac medications before your next appointment, please call your pharmacy*   Lab Work: None ordered If you have labs (blood work) drawn today and your tests are completely normal, you will receive your results only by: MyChart Message (if you have MyChart) OR A paper copy in the mail If you have any lab test that is abnormal or we need to change your treatment, we will call you to review the results.   Testing/Procedures: None ordered   Follow-Up: At [facility], you and your health needs are our priority.  As part of our continuing mission to provide you with exceptional heart care, we have created designated Provider Care Teams.  These Care Teams include your primary Cardiologist (physician) and Advanced Practice Providers (APPs -  Physician Assistants and Nurse Practitioners) who all work together to provide you with the care you need, when you need it.  We recommend signing up for the patient portal called "MyChart".  Sign up information is provided on this After Visit Summary.  MyChart is used to connect with patients for Virtual Visits (Telemedicine).  Patients are able to view lab/test results, encounter notes, upcoming appointments, etc.  Non-urgent messages can be sent to your provider as well.   To learn more about what you can do with MyChart, go to [email or web address].    Your next appointment:   6 month(s)  The format for your next appointment:   In Person  Provider:   Dr. Arida  Important Information About Sugar       

## 2022-09-06 NOTE — Progress Notes (Signed)
Cardiology Office Note   Date:  09/06/2022   ID:  Katherine Hall, DOB 1941-07-01, MRN [id number]  PCP:  Stacie Glaze, DO  Cardiologist: Dr. Acie Fredrickson  No chief complaint on file.      History of Present Illness: Katherine Hall is a 81 y.o. female who is here today for follow-up regarding peripheral arterial disease.   She has known history of coronary artery disease status post CABG in 2017, essential hypertension, anemia and hyperlipidemia with intolerance to statins.  She is not diabetic and is a lifelong non-smoker. She was hospitalized in March, 2022 with unstable angina.  Cardiac catheterization showed severe underlying three-vessel coronary artery disease with patent grafts including LIMA to LAD, SVG to OM1/OM 3, SVG to diagonal with 60% anastomosis stenosis and SVG to right PDA.  There was 90% stenosis in the proximal portion of the SVG to right PDA followed by another 90% stenosis leading into an aneurysmal segment which was followed by a 95% stenosis.  I attempted angioplasty of SVG to right PDA but I was not able to cross the stenosis with multiple wires beyond the aneurysmal segment.  Subsequently, there was loss of flow and acute closure with wire manipulation.  The patient was treated medically and did reasonably well.    She is known to have peripheral arterial disease with claudication.  She is status post bilateral SFA endovascular intervention with drug-coated balloon angioplasty.  She was hospitalized in June of 2022 with GI bleed requiring transfusion.   Plavix was held and then was resumed without aspirin.  No bleeding since then.  She had cardiac catheterization done in August which showed patent grafts except the SVG to the RCA which is known to be chronically occluded.  She was hospitalized shortly after that with hypokalemia and hyponatremia.  Hydrochlorothiazide was discontinued.  She has been feeling weak overall but gradually improved.  She reports stable  moderate right calf claudication.  No chest pain.  Past Medical History:  Diagnosis Date   AKI (acute kidney injury) ([place]) 07/28/2022   Anemia    Arthritis    Coronary artery disease    s/p CABG in 2017 // Myoview 11/21: No ischemia or infarction, EF > 65, low risk  // [place] >> cath 3/22: S-RPDA w severe disease - PCI unsuccessful & c/b peri-procedure MI (acute closure w wire manipulation>>inf ST elev) >> Med Rx    Echocardiogram 02/2021    EF 65-70, no RWMA, mild LVH, Gr 1 DD, normal RVSF, RVSP 19.5, mild AI, mild AV sclerosis   Familial hypercholesterolemia    a. h/o intolerance of statins, prev on Repatha but insurance stopped covering.   Gastric ulcer    a. h/o bleeding ulcer 2009 requiring 3 pints of blood.   History of removal of cyst    a. 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   HTN (hypertension)    Hypothyroidism    PAD (peripheral artery disease) (HCC)    S/p L SFA PTA // ABIs/LE arterial US 4/22: R 0.7; L 0.86 // R SFA 75-99; L SFA angioplasty site 30-49, distal 50-74 // ectatic distal aorta 2.6 x 2.6 cm   Prediabetes     Past Surgical History:  Procedure Laterality Date   ABDOMINAL AORTOGRAM W/LOWER EXTREMITY Bilateral 11/06/2019   Procedure: ABDOMINAL AORTOGRAM W/LOWER EXTREMITY;  Surgeon: Wellington Hampshire, MD;  Location: [place] [facility];  Service: Cardiovascular;  Laterality: Bilateral;   ABDOMINAL AORTOGRAM W/LOWER EXTREMITY  N/A 03/31/2021   Procedure: ABDOMINAL AORTOGRAM W/LOWER EXTREMITY;  Surgeon: Wellington Hampshire, MD;  Location: [place] [facility];  Service: Cardiovascular;  Laterality: N/A;   ABDOMINAL AORTOGRAM W/LOWER EXTREMITY N/A 09/15/2021   Procedure: ABDOMINAL AORTOGRAM W/LOWER EXTREMITY;  Surgeon: Wellington Hampshire, MD;  Location: [place] [facility];  Service: Cardiovascular;  Laterality: N/A;   ABDOMINAL HYSTERECTOMY     APPENDECTOMY     CARDIAC CATHETERIZATION N/A 07/05/2016   Procedure: Left Heart Cath and Coronary  Angiography;  Surgeon: Burnell Blanks, MD;  Location: [place] [facility];  Service: Cardiovascular;  Laterality: N/A;   CARDIAC SURGERY     cyst, not on heart.   CORONARY ARTERY BYPASS GRAFT N/A 07/07/2016   Procedure: CORONARY ARTERY BYPASS GRAFTING (CABG) x 5 with endoscopic harvesting of the right greater saphenous vein;  Surgeon: Gaye Pollack, MD;  Location: [facility];  Service: Open Heart Surgery;  Laterality: N/A;   CORONARY BALLOON ANGIOPLASTY N/A 02/03/2021   Procedure: CORONARY BALLOON ANGIOPLASTY;  Surgeon: Wellington Hampshire, MD;  Location: [place] [facility];  Service: Cardiovascular;  Laterality: N/A;  svg to pda   ESOPHAGOGASTRODUODENOSCOPY (EGD) WITH PROPOFOL N/A 05/08/2021   Procedure: ESOPHAGOGASTRODUODENOSCOPY (EGD) WITH PROPOFOL;  Surgeon: Arta Silence, MD;  Location: [facility];  Service: Endoscopy;  Laterality: N/A;   HEMORRHOID SURGERY     INTRAOPERATIVE TRANSESOPHAGEAL ECHOCARDIOGRAM N/A 07/07/2016   Procedure: INTRAOPERATIVE TRANSESOPHAGEAL ECHOCARDIOGRAM;  Surgeon: Gaye Pollack, MD;  Location: [facility];  Service: Open Heart Surgery;  Laterality: N/A;   LEFT HEART CATH AND CORS/GRAFTS ANGIOGRAPHY N/A 02/03/2021   Procedure: LEFT HEART CATH AND CORS/GRAFTS ANGIOGRAPHY;  Surgeon: Wellington Hampshire, MD;  Location: [place] [facility];  Service: Cardiovascular;  Laterality: N/A;   LEFT HEART CATH AND CORS/GRAFTS ANGIOGRAPHY N/A 07/18/2022   Procedure: LEFT HEART CATH AND CORS/GRAFTS ANGIOGRAPHY;  Surgeon: Martinique, Peter M, MD;  Location: [place] [facility];  Service: Cardiovascular;  Laterality: N/A;   PERIPHERAL VASCULAR ATHERECTOMY Left 09/15/2021   Procedure: PERIPHERAL VASCULAR ATHERECTOMY;  Surgeon: Wellington Hampshire, MD;  Location: [place] [facility];  Service: Cardiovascular;  Laterality: Left;   PERIPHERAL VASCULAR BALLOON ANGIOPLASTY  11/06/2019   Procedure: PERIPHERAL VASCULAR BALLOON ANGIOPLASTY;  Surgeon: Wellington Hampshire, MD;  Location: [place] [facility];  Service:  Cardiovascular;;  cutting and dcb   PERIPHERAL VASCULAR BALLOON ANGIOPLASTY Right 03/31/2021   Procedure: PERIPHERAL VASCULAR BALLOON ANGIOPLASTY;  Surgeon: Wellington Hampshire, MD;  Location: [place] [facility];  Service: Cardiovascular;  Laterality: Right;  SFA   SHOULDER SURGERY       Current Outpatient Medications  Medication Sig Dispense Refill   acyclovir (ZOVIRAX) 400 MG tablet Take 400 mg by mouth daily as needed (for blister outbreaks per patient).     Carboxymethylcellul-Glycerin (LUBRICATING EYE DROPS OP) Place 1 drop into both eyes daily as needed (dry eyes).     clopidogrel (PLAVIX) 75 MG tablet Take 1 tablet (75 mg total) by mouth daily. Resume only after 5 days ( on 05/13/21) 90 tablet 3   diphenhydramine-acetaminophen (TYLENOL PM) 25-500 MG TABS tablet Take 2 tablets by mouth at bedtime.     Evolocumab with Infusor ([place]) 420 MG/3.5ML SOCT Inject 3.6 mLs into the skin every 30 (thirty) days. 3.6 mL 11   fluticasone (FLONASE) 50 MCG/ACT nasal spray Place 1 spray into both nostrils daily as needed for allergies.     isosorbide mononitrate (IMDUR) 30 MG 24 hr tablet Take  1 tablet (30 mg total) by mouth daily. 30 tablet 1   levothyroxine (SYNTHROID, LEVOTHROID) 50 MCG tablet Take 50 mcg by mouth daily before breakfast.     lisinopril (ZESTRIL) 2.5 MG tablet Take 1 tablet (2.5 mg total) by mouth daily. 90 tablet 3   metoprolol tartrate (LOPRESSOR) 25 MG tablet Take 1 tablet (25 mg total) by mouth 2 (two) times daily. 60 tablet 4   Multiple Vitamins-Minerals (EYE VITAMINS & MINERALS PO) Take 1 tablet by mouth 2 (two) times daily.     nitroGLYCERIN (NITROSTAT) 0.4 MG SL tablet Place 1 tablet (0.4 mg total) under the tongue every 5 (five) minutes x 3 doses as needed for chest pain. 25 tablet 1   pantoprazole (PROTONIX) 20 MG tablet Take 20 mg by mouth daily.     ranolazine (RANEXA) 500 MG 12 hr tablet Take 1 tablet (500 mg total) by mouth 2 (two) times daily. 60 tablet 4    rOPINIRole (REQUIP) 2 MG tablet Take 2 mg by mouth at bedtime.     traMADol (ULTRAM) 50 MG tablet Take 50 mg by mouth daily.     No current facility-administered medications for this visit.    Allergies:   Penicillins, Lincomycin, Naproxen sodium, Statins, Crestor [rosuvastatin], Latex, Lipitor [atorvastatin], and Tape    Social History:  The patient  reports that she has never smoked. She has never used smokeless tobacco. She reports that she does not drink alcohol and does not use drugs.   Family History:  The patient's family history includes CAD in her father; Coronary artery disease in her brother and sister; Stroke in her mother.    ROS:  Please see the history of present illness.   Otherwise, review of systems are positive for none.   All other systems are reviewed and negative.    PHYSICAL EXAM: VS:  BP (!) 160/84   Pulse 64   Ht '4\' 8"'$  (1.422 m)   Wt 116 lb 12.8 oz (53 kg)   SpO2 100%   BMI 26.19 kg/m  , BMI Body mass index is 26.19 kg/m. GEN: Well nourished, well developed, in no acute distress  HEENT: normal  Neck: no JVD, carotid bruits, or masses Cardiac: RRR; no murmurs, rubs, or gallops,no edema  Respiratory:  clear to auscultation bilaterally, normal work of breathing GI: soft, nontender, nondistended, + BS MS: no deformity or atrophy  Skin: warm and dry, no rash Neuro:  Strength and sensation are intact Psych: euthymic mood, full affect   EKG:  EKG is not  ordered today.    Recent Labs: 07/28/2022: ALT 11; Hemoglobin 11.4; Magnesium 2.0; Platelets 411; TSH 1.109 07/29/2022: BUN 28; Creatinine, Ser 0.98; Potassium 4.2; Sodium 128    Lipid Panel    Component Value Date/Time   CHOL 262 (H) 07/18/2022 0553   CHOL 285 (H) 04/21/2022 0922   TRIG 146 07/18/2022 0553   HDL 53 07/18/2022 0553   HDL 55 04/21/2022 0922   CHOLHDL 4.9 07/18/2022 0553   VLDL 29 07/18/2022 0553   LDLCALC 180 (H) 07/18/2022 0553   LDLCALC 184 (H) 04/21/2022 0922      Wt  Readings from Last 3 Encounters:  09/06/22 116 lb 12.8 oz (53 kg)  08/01/22 119 lb (54 kg)  07/28/22 118 lb 2.7 oz (53.6 kg)           No data to display            ASSESSMENT AND PLAN:  1.  Peripheral  arterial disease: Status post bilateral SFA intervention most recently left SFA orbital atherectomy and drug-coated balloon angioplasty.  Recommend continuing medical therapy.  Most recent Doppler studies in May showed an ABI of 0.63 on the right and 0.89 on the left. She does have moderate right calf claudication.  I advised her to try walking more.  No indication for revascularization at the present time.  2.  Coronary artery disease involving native coronary arteries without angina forms of angina: Status post CABG in 2017 .  Chronically occluded SVG to right PDA but other grafts are patent.  Continue medical therapy.  3. Familial hyperlipidemia with severely elevated LDL: Intolerance to statins.  She is currently on Repatha and seems to be tolerating the medication.  Previous LDL was 243 and improved to 180.  4.  Essential hypertension: Blood pressure is elevated.  Consider increasing lisinopril or switching metoprolol to carvedilol.   Disposition:   FU with me in 6 months.  Signed,  [name], MD  09/06/2022 9:27 AM    [facility]

## 2022-09-07 NOTE — Addendum Note (Signed)
Addended by: Angelena Form on: 09/07/2022 03:41 PM   Modules accepted: Orders

## 2022-09-19 ENCOUNTER — Telehealth: Payer: Self-pay | Admitting: Cardiovascular Disease

## 2022-09-19 MED ORDER — METOPROLOL TARTRATE 25 MG PO TABS: 25.0000 mg | ORAL_TABLET | Freq: Two times a day (BID) | ORAL | 0 refills | Status: DC

## 2022-09-19 NOTE — Telephone Encounter (Signed)
*  STAT* If patient is at the pharmacy, call can be transferred to refill team.   1. Which medications need to be refilled? (please list name of each medication and dose if known)  metoprolol tartrate (LOPRESSOR) 25 MG tablet  2. Which pharmacy/location (including street and city if local pharmacy) is medication to be sent to? [id number] [id number]. Do they need a 30 day or 90 day supply? Needs a week supply to last her until [facility] prescription arrives.

## 2022-09-19 NOTE — Telephone Encounter (Signed)
Pt's medication was sent to pt's pharmacy as requested. Confirmation received.  °

## 2022-09-20 ENCOUNTER — Other Ambulatory Visit: Payer: Self-pay

## 2022-09-20 MED ORDER — METOPROLOL TARTRATE 25 MG PO TABS: 25.0000 mg | ORAL_TABLET | Freq: Two times a day (BID) | ORAL | 3 refills | Status: DC

## 2022-09-30 ENCOUNTER — Ambulatory Visit: Payer: Medicare Other | Attending: Physician Assistant

## 2022-09-30 DIAGNOSIS — I739 Peripheral vascular disease, unspecified: Secondary | ICD-10-CM

## 2022-09-30 DIAGNOSIS — I25118 Atherosclerotic heart disease of native coronary artery with other forms of angina pectoris: Secondary | ICD-10-CM

## 2022-09-30 DIAGNOSIS — R5383 Other fatigue: Secondary | ICD-10-CM

## 2022-09-30 DIAGNOSIS — E7800 Pure hypercholesterolemia, unspecified: Secondary | ICD-10-CM

## 2022-09-30 DIAGNOSIS — I10 Essential (primary) hypertension: Secondary | ICD-10-CM

## 2022-10-01 LAB — LIPID PANEL
Chol/HDL Ratio: 4.7 ratio — ABNORMAL HIGH (ref 0.0–4.4)
Cholesterol, Total: 294 mg/dL — ABNORMAL HIGH (ref 100–199)
HDL: 63 mg/dL (ref >39)
LDL Chol Calc (NIH): 196 mg/dL — ABNORMAL HIGH (ref 0–99)
Triglycerides: 189 mg/dL — ABNORMAL HIGH (ref 0–149)
VLDL Cholesterol Cal: 35 mg/dL (ref 5–40)

## 2022-10-01 LAB — LIPOPROTEIN A (LPA): Lipoprotein (a): 282 nmol/L — ABNORMAL HIGH (ref <75.0)

## 2022-10-05 ENCOUNTER — Telehealth: Payer: Self-pay | Admitting: Cardiovascular Disease

## 2022-10-05 DIAGNOSIS — E7801 Familial hypercholesterolemia: Secondary | ICD-10-CM

## 2022-10-05 NOTE — Telephone Encounter (Signed)
Elgie Collard, PA-C  10/03/2022  4:32 PM EDT     Ms. [place],    Your lipoprotein (a) remains elevated as well as your triglycerides and LDL. You are already on Repatha. Have you been taking this medication regularly?    Can we send a referral to the lipid clinic (either PharmD or Dr. Debara Pickett) ?    Thanks! Elgie Collard, PA-C   The patient has been notified of the result and verbalized understanding.  All questions (if any) were answered. Bernestine Amass, RN 10/05/2022 9:23 AM   Referral has been placed.

## 2022-10-05 NOTE — Telephone Encounter (Signed)
Patient returned RN's call regarding results. 

## 2022-10-09 NOTE — Progress Notes (Unsigned)
Patient ID: Katherine Hall                 DOB: 09/23/1941                    MRN: [id number]      HPI: Katherine Hall is a 81 y.o. female patient referred to lipid clinic by Dr.Nahser. PMH is significant for coronary artery disease status post CABG in 2017, essential hypertension, anemia and hyperlipidemia with intolerance to statins.   Today patient has no acute concern. Patient had tried various statins whole her life could not remember the dose and name of the statins but was not able to tolerate any of them. Felt flu like muscle and body pain on some of them and some caused severe joint/muscle pain. Have been on Repatha for long time now and reports tolerating it well without side effects. Thinks her lipid will never go down as it runs in the family, whole her family had heart diseases (siblings, both parents). She had never smoked but has extensive exposure to second hand smoke from her family members ( ex-husband, parents). Report having SOB only on exertion. Have twinges in the chest that goes away with single dose of nitroglycerin. We discuss the importance of achieving lower LDLc and potential medication options.   Current Medications: Repatha 140 mg  Intolerances: Lipitor, Crestor  Risk Factors: CAD-CABG in 2017, age,  LDL goal: <55 mg/dl  Diet:  eat out every meal - hard to cooke for 2 people  Eats small portions, no bacon, no sausage, watch salt intake  Breakfast: scambled , gritts ,gravey, coffee  No lunch Supper - lots of  sea food (oysters, fish)3- 4 times week , meat is usually backed or grilled   Exercise: Does not do any exercise walking for 5 min leads to leg pain and cramps Encourage to start 5 min of walk and or light chair stretches and work your way up as per tolerability   Family History: siblings- CAD, Father- MI at age of 29 Mother: smoked and had stroke at age 85   Social History:  Tobacco: never exposure to second hand smoke from whole family  EtOH: social once a  month 1-2 drinks   Labs: Lipid Panel     Component Value Date/Time   CHOL 294 (H) 09/30/2022 0853   TRIG 189 (H) 09/30/2022 0853   HDL 63 09/30/2022 0853   CHOLHDL 4.7 (H) 09/30/2022 0853   CHOLHDL 4.9 07/18/2022 0553   VLDL 29 07/18/2022 0553   [place] 196 (H) 09/30/2022 0853   LABVLDL 35 09/30/2022 0853    Past Medical History:  Diagnosis Date   AKI (acute kidney injury) ([place]) 07/28/2022   Anemia    Arthritis    Coronary artery disease    s/p CABG in 2017 // Myoview 11/21: No ischemia or infarction, EF > 65, low risk  // [place] >> cath 3/22: S-RPDA w severe disease - PCI unsuccessful & c/b peri-procedure MI (acute closure w wire manipulation>>inf ST elev) >> Med Rx    Echocardiogram 02/2021    EF 65-70, no RWMA, mild LVH, Gr 1 DD, normal RVSF, RVSP 19.5, mild AI, mild AV sclerosis   Familial hypercholesterolemia    a. h/o intolerance of statins, prev on Repatha but insurance stopped covering.   Gastric ulcer    a. h/o bleeding ulcer 2009 requiring 3 pints of blood.   History of removal of cyst    a. per pt, h/o open  heart surgery for tennis-ball sized cyst on heart.   HTN (hypertension)    Hypothyroidism    PAD (peripheral artery disease) (HCC)    S/p L SFA PTA // ABIs/LE arterial US 4/22: R 0.7; L 0.86 // R SFA 75-99; L SFA angioplasty site 30-49, distal 50-74 // ectatic distal aorta 2.6 x 2.6 cm   Prediabetes     Current Outpatient Medications on File Prior to Visit  Medication Sig Dispense Refill   acyclovir (ZOVIRAX) 400 MG tablet Take 400 mg by mouth daily as needed (for blister outbreaks per patient).     Carboxymethylcellul-Glycerin (LUBRICATING EYE DROPS OP) Place 1 drop into both eyes daily as needed (dry eyes).     clopidogrel (PLAVIX) 75 MG tablet Take 1 tablet (75 mg total) by mouth daily. Resume only after 5 days ( on 05/13/21) 90 tablet 3   diphenhydramine-acetaminophen (TYLENOL PM) 25-500 MG TABS tablet Take 2 tablets by mouth at bedtime.     Evolocumab with  Infusor ([place]) 420 MG/3.5ML SOCT Inject 3.6 mLs into the skin every 30 (thirty) days. 3.6 mL 11   fluticasone (FLONASE) 50 MCG/ACT nasal spray Place 1 spray into both nostrils daily as needed for allergies.     isosorbide mononitrate (IMDUR) 30 MG 24 hr tablet Take 1 tablet (30 mg total) by mouth daily. 30 tablet 1   levothyroxine (SYNTHROID, LEVOTHROID) 50 MCG tablet Take 50 mcg by mouth daily before breakfast.     lisinopril (ZESTRIL) 2.5 MG tablet Take 1 tablet (2.5 mg total) by mouth daily. 90 tablet 3   metoprolol tartrate (LOPRESSOR) 25 MG tablet Take 1 tablet (25 mg total) by mouth 2 (two) times daily. 90 tablet 3   Multiple Vitamins-Minerals (EYE VITAMINS & MINERALS PO) Take 1 tablet by mouth 2 (two) times daily.     nitroGLYCERIN (NITROSTAT) 0.4 MG SL tablet Place 1 tablet (0.4 mg total) under the tongue every 5 (five) minutes x 3 doses as needed for chest pain. 25 tablet 1   pantoprazole (PROTONIX) 20 MG tablet Take 20 mg by mouth daily.     ranolazine (RANEXA) 500 MG 12 hr tablet Take 1 tablet (500 mg total) by mouth 2 (two) times daily. 60 tablet 4   rOPINIRole (REQUIP) 2 MG tablet Take 2 mg by mouth at bedtime.     traMADol (ULTRAM) 50 MG tablet Take 50 mg by mouth daily.     No current facility-administered medications on file prior to visit.    Allergies  Allergen Reactions   Penicillins Anaphylaxis    Did it involve swelling of the face/tongue/throat, SOB, or low BP? Yes Did it involve sudden or severe rash/hives, skin peeling, or any reaction on the inside of your mouth or nose? No Did you need to seek medical attention at a hospital or doctor's office? Yes When did it last happen?      10+ years If all above answers are "NO", may proceed with cephalosporin use.     Lincomycin Swelling and Other (See Comments)    passed out   Naproxen Sodium Other (See Comments)    Stomach ulcers   Statins Other (See Comments)    Myalgias   Crestor [Rosuvastatin]      myalgias   Latex Other (See Comments)    Blisters when in contact with skin   Lipitor [Atorvastatin]     myalgias   Tape     blistering     Hyperlipidemia Assessment:  LDLc elevated: 196  mg/dl -above the goal (<= 55 mg/dl), Lipoprotein a level is >200  Patient can not tolerate stains, had tried atorvastatin, rosuvastatin, pravastatin and simvastatin in the past all gave muscle pain   Patient has been on Repatha, reports tolerating it well without any side effects  Current therapy is appropriate but insufficient to lower LDLc . Patient is in agreement to try one of the high intensity statins once week dose and work her way up as per tolerability  Also in agreement to add new agent and/or enroll into one of the lipid lowering agent's clinical trial  ([place] (a))   Plan:  Start taking rosuvastatin 20 mg once week for 1 month if tolerated increase the dose to twice a week  Start Nexlizet 180/10 mg once daily  Repeat lipid lab in 12 weeks post Nexlizet initiation Pharma D will follow up in 2 weeks via phone to assess the tolerability of the statin     Thank you,  Cammy Copa, Pharm.D [facility] A Division of [facility] [address], [place], [place] [address]  Phone: [phone]; Fax: [phone]

## 2022-10-10 ENCOUNTER — Other Ambulatory Visit: Payer: Self-pay | Admitting: Cardiovascular Disease

## 2022-10-10 ENCOUNTER — Ambulatory Visit: Payer: Medicare Other | Attending: Family Medicine | Admitting: Student

## 2022-10-10 ENCOUNTER — Telehealth: Payer: Self-pay | Admitting: Pharmacist

## 2022-10-10 DIAGNOSIS — E7801 Familial hypercholesterolemia: Secondary | ICD-10-CM | POA: Diagnosis not present

## 2022-10-10 MED ORDER — NEXLIZET 180-10 MG PO TABS: 1.0000 | ORAL_TABLET | Freq: Every day | ORAL | 12 refills | Status: DC

## 2022-10-10 MED ORDER — ROSUVASTATIN CALCIUM 20 MG PO TABS: ORAL_TABLET | ORAL | 1 refills | Status: DC

## 2022-10-10 NOTE — Patient Instructions (Addendum)
Changes made by your pharmacist Cammy Copa, PharmD at today's visit:    Instructions/Changes  (what do you need to do) Your Notes  (what you did and when you did it)  1.Start taking Rosuvastatin 20 mg once week if tolerated may increased to 20 mg twice a week    2. Start taking Nexlizet 180/10 mg once daily     Pharmacist will follow up in 2 weeks to see how you are tolerating Rosuvastatin.   If you have any questions or concerns please use My Chart to send questions or call the office at [phone]'

## 2022-10-10 NOTE — Assessment & Plan Note (Signed)
Assessment:  LDLc elevated: 196 mg/dl -above the goal (<= 55 mg/dl), Lipoprotein a level is >200  Patient can not tolerate stains, had tried atorvastatin, rosuvastatin, pravastatin and simvastatin in the past all gave muscle pain   Patient has been on Repatha, reports tolerating it well without any side effects  Current therapy is appropriate but insufficient to lower LDLc . Patient is in agreement to try one of the high intensity statins once week dose and work her way up as per tolerability  Also in agreement to add new agent and/or enroll into one of the lipid lowering agent's clinical trial  ([place] (a))   Plan:  Start taking rosuvastatin 20 mg once week for 1 month if tolerated increase the dose to twice a week  Start Nexlizet 180/10 mg once daily  Repeat lipid lab in 12 weeks post Nexlizet initiation Pharma D will follow up in 2 weeks via phone to assess the tolerability of the statin

## 2022-10-10 NOTE — Telephone Encounter (Addendum)
Applied for  Nexlizet 180/'10mg'$  PA  Key: [id number]   It is approved through 12/05/2023  [facility] [address]. Fatima Sanger co-pay card worked and patient is not paying anything - confirmed with the pharmacy    Patient has been informed.

## 2022-10-11 MED ORDER — NEXLIZET 180-10 MG PO TABS: 1.0000 | ORAL_TABLET | Freq: Every day | ORAL | 9 refills | Status: DC

## 2022-10-18 ENCOUNTER — Telehealth: Payer: Self-pay | Admitting: Cardiovascular Disease

## 2022-10-18 NOTE — Telephone Encounter (Signed)
Pt c/o medication issue:  1. Name of Medication:   rosuvastatin (CRESTOR) 20 MG tablet    2. How are you currently taking this medication (dosage and times per day)? Take 1 tablet once a week for 1 month if tolerated may increase to 1 tablet twice a week   3. Are you having a reaction (difficulty breathing--STAT)? no  4. What is your medication issue? Patient states she can't handle this medication. Please advise

## 2022-10-23 ENCOUNTER — Encounter: Payer: Self-pay | Admitting: Cardiovascular Disease

## 2022-10-23 NOTE — Progress Notes (Unsigned)
Cardiology Office Note   Date:  10/24/2022   ID:  Katherine, Hall Aug 28, 1941, MRN [id number]  PCP:  Stacie Glaze, DO  Cardiologist:   Mertie Moores, MD , [facility]   Chief Complaint  Patient presents with   Coronary Artery Disease        Hyperlipidemia   Problem list 1. Coronary artery disease-status post coronary artery bypass grafting-  CORONARY ARTERY BYPASS GRAFTING x 5 -LIMA to LAD -SVG to DIAGONAL -SVG to PDA -SEQ SVG OM1 and OM2  2. Hypertension 3. Hyperlipidemia 4. Anemia     Katherine Hall is a 81 y.o. female who presents for follow-up of her coronary artery disease. She had coronary artery bypass grafting in August. 2017   She has had a moderate left pleural effusion Still gradually improving . Needs an order to do cardiac rehab.  Wants to do heart strides at [facility]   Breathing is better.  Still has some chest soreness,  The angina is better   Has very high lipids.   Cannot tolerate statins.  Has seen [facility] [place] to initiate Repatha - working on that  Still sleeping in a recliner  Jan. 8, 2018:  Katherine Hall is doing well. She had coronary artery bypass grafting in August, 2017: Is not doing cardiac rehab.  Has lots of leg pain  Needs to have surgery on her right knee Has SVG harvest site pain .  Does not take her BP regularly  Aug. 22, 2018:  Katherine Hall is still having some chest pains. Described as a discomfort.  Last 5 minutes. She does not know if it worsens with twisting or taking a deep breath  Is not exercising ,.   May 01, 2018: Seen for follow-up of her coronary artery disease.  She is status post coronary artery bypass grafting in August, 2017.  Also has a history of hypertension and hyperlipidemia. No cp.   Not exercising much  Has PVD,   Had a coated stent placed.  Complicated by a significant groin bleed Required transfusion of 2 u PRBS   Some various aches and pains .  Has some left neck pain .   Lasts for a few seconds.   Has had right  knee surgery since I last saw her.   No CP or dyspnea , Gets fatigued easily . Does not sleep well   Dec. 2, 2019:  Not exercising  Leg pain  No CP  Has arthrisit and PAD  Her Lipids are very high. Did well with Repatha but she couldn't afford it  Is in the Clear trial  But couldn't tolerate the Bempidoic acid    Aug. 25, 2020  Doing well Has some pain in her left leg.  Pain increases with waking  Concerned with PAD  Is in a cholesterol study so we do not have chol levels since 2018.   March 12, 2020:  Katherine Hall  was seen today for a follow-up of her hyperlipidemia, coronary artery disease, leg pain. Lipids are still elevated.    Nov. 8, 2021: [name] was seen today for follow up of her HLD, CAD, PAD  Has been referred to Lipid clinic and has seen Dr. Debara Pickett. BP has been elevated.   BP readings at home have been elevated. I reviewed her BP log.     Went to the ER with CP , left shoulder pain . BP was elevated  She is on hydrochlorothiazide 12.5 mg a day and lisinopril 20 mg twice a  day.  I reviewed her diet.   Does not eat much salt.  .  Lipids have continued to be elevated.   She is seeing Dr. Debara Pickett and is working on getting into a study for a higher dose Repatha once a month.    March 15, 2021: Katherine Hall is seen today for follow up of her CAD/ CABG, HLD, PAD  Cath on February 03, 2021: Severe native CAD LIMA - LAD patent SVG- OM1/OM3 - patent SVG -D1 - 60% stenosis SVG - PDA - sequential 90%, 95%, 90%  stenosis PCI attempt was unsuccessful.   Resulted in closue of the SVG and a small inf. MI  She was treated medically and did well .  Has had to take several NTG since that time .   She has claudication and will follow up with Dr. Fletcher Anon Still having lots of claudication.   She is scheduled  She sees Dr. Debara Pickett for HLD.   Does not tolerate statins and zetia  Did not get a desired response from [place]. Considered Inclisiran - she wants to take this   Will reach out to Dr. Debara Pickett    Apr 04, 2022:  Katherine Hall is seen today for follow-up of her coronary artery disease, peripheral arterial disease and hyperlipidemia. Felt an unusual sensation in her right leg several weeks ago .  Has has some chest twinges, and chest heaviness.   Also has some episodes of shortness of breath   Seems to have had a reaction to Repatha - nausea , vomitting   Nov. 20, 2023 Katherine Hall is seen for follow up of her CAD, CABG, PAD, HLD  Her home BP readings are mildly elevated.  Gave up processed meat after her CABG .     Is not feeling well  Took NTG last Sunday  Had a 2nd episode of CP - sharp jabbing pain  Occasionally radiates through to the intrascapular pain  Does not tolerate rosuvastatin   Had repeat cath in Aug.   Has an occluded RCA and SVG to the RCA    Past Medical History:  Diagnosis Date   AKI (acute kidney injury) ([place]) 07/28/2022   Anemia    Arthritis    Coronary artery disease    s/p CABG in 2017 // Myoview 11/21: No ischemia or infarction, EF > 65, low risk  // [place] >> cath 3/22: S-RPDA w severe disease - PCI unsuccessful & c/b peri-procedure MI (acute closure w wire manipulation>>inf ST elev) >> Med Rx    Echocardiogram 02/2021    EF 65-70, no RWMA, mild LVH, Gr 1 DD, normal RVSF, RVSP 19.5, mild AI, mild AV sclerosis   Familial hypercholesterolemia    a. h/o intolerance of statins, prev on Repatha but insurance stopped covering.   Gastric ulcer    a. h/o bleeding ulcer 2009 requiring 3 pints of blood.   History of removal of cyst    a. 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   HTN (hypertension)    Hypothyroidism    PAD (peripheral artery disease) (HCC)    S/p L SFA PTA // ABIs/LE arterial US 4/22: R 0.7; L 0.86 // R SFA 75-99; L SFA angioplasty site 30-49, distal 50-74 // ectatic distal aorta 2.6 x 2.6 cm   Prediabetes     Past Surgical History:  Procedure Laterality Date   ABDOMINAL AORTOGRAM W/LOWER  EXTREMITY Bilateral 11/06/2019   Procedure: ABDOMINAL AORTOGRAM W/LOWER EXTREMITY;  Surgeon: Wellington Hampshire, MD;  Location: [facility];  Service: Cardiovascular;  Laterality: Bilateral;   ABDOMINAL AORTOGRAM W/LOWER EXTREMITY N/A 03/31/2021   Procedure: ABDOMINAL AORTOGRAM W/LOWER EXTREMITY;  Surgeon: Wellington Hampshire, MD;  Location: [facility];  Service: Cardiovascular;  Laterality: N/A;   ABDOMINAL AORTOGRAM W/LOWER EXTREMITY N/A 09/15/2021   Procedure: ABDOMINAL AORTOGRAM W/LOWER EXTREMITY;  Surgeon: Wellington Hampshire, MD;  Location: [facility];  Service: Cardiovascular;  Laterality: N/A;   ABDOMINAL HYSTERECTOMY     APPENDECTOMY     CARDIAC CATHETERIZATION N/A 07/05/2016   Procedure: Left Heart Cath and Coronary Angiography;  Surgeon: Burnell Blanks, MD;  Location: [facility];  Service: Cardiovascular;  Laterality: N/A;   CARDIAC SURGERY     cyst, not on heart.   CORONARY ARTERY BYPASS GRAFT N/A 07/07/2016   Procedure: CORONARY ARTERY BYPASS GRAFTING (CABG) x 5 with endoscopic harvesting of the right greater saphenous vein;  Surgeon: Gaye Pollack, MD;  Location: [facility];  Service: Open Heart Surgery;  Laterality: N/A;   CORONARY BALLOON ANGIOPLASTY N/A 02/03/2021   Procedure: CORONARY BALLOON ANGIOPLASTY;  Surgeon: Wellington Hampshire, MD;  Location: [facility];  Service: Cardiovascular;  Laterality: N/A;  svg to pda   ESOPHAGOGASTRODUODENOSCOPY (EGD) WITH PROPOFOL N/A 05/08/2021   Procedure: ESOPHAGOGASTRODUODENOSCOPY (EGD) WITH PROPOFOL;  Surgeon: Arta Silence, MD;  Location: [facility];  Service: Endoscopy;  Laterality: N/A;   HEMORRHOID SURGERY     INTRAOPERATIVE TRANSESOPHAGEAL ECHOCARDIOGRAM N/A 07/07/2016   Procedure: INTRAOPERATIVE TRANSESOPHAGEAL ECHOCARDIOGRAM;  Surgeon: Gaye Pollack, MD;  Location: [facility];  Service: Open Heart Surgery;  Laterality: N/A;   LEFT HEART CATH AND CORS/GRAFTS ANGIOGRAPHY N/A 02/03/2021   Procedure: LEFT HEART  CATH AND CORS/GRAFTS ANGIOGRAPHY;  Surgeon: Wellington Hampshire, MD;  Location: [facility];  Service: Cardiovascular;  Laterality: N/A;   LEFT HEART CATH AND CORS/GRAFTS ANGIOGRAPHY N/A 07/18/2022   Procedure: LEFT HEART CATH AND CORS/GRAFTS ANGIOGRAPHY;  Surgeon: Martinique, Peter M, MD;  Location: [facility];  Service: Cardiovascular;  Laterality: N/A;   PERIPHERAL VASCULAR ATHERECTOMY Left 09/15/2021   Procedure: PERIPHERAL VASCULAR ATHERECTOMY;  Surgeon: Wellington Hampshire, MD;  Location: [facility];  Service: Cardiovascular;  Laterality: Left;   PERIPHERAL VASCULAR BALLOON ANGIOPLASTY  11/06/2019   Procedure: PERIPHERAL VASCULAR BALLOON ANGIOPLASTY;  Surgeon: Wellington Hampshire, MD;  Location: [facility];  Service: Cardiovascular;;  cutting and dcb   PERIPHERAL VASCULAR BALLOON ANGIOPLASTY Right 03/31/2021   Procedure: PERIPHERAL VASCULAR BALLOON ANGIOPLASTY;  Surgeon: Wellington Hampshire, MD;  Location: [facility];  Service: Cardiovascular;  Laterality: Right;  SFA   SHOULDER SURGERY       Current Outpatient Medications  Medication Sig Dispense Refill   acyclovir (ZOVIRAX) 400 MG tablet Take 400 mg by mouth daily as needed (for blister outbreaks per patient).     Carboxymethylcellul-Glycerin (LUBRICATING EYE DROPS OP) Place 1 drop into both eyes daily as needed (dry eyes).     clopidogrel (PLAVIX) 75 MG tablet Take 1 tablet (75 mg total) by mouth daily. Resume only after 5 days ( on 05/13/21) 90 tablet 3   diphenhydramine-acetaminophen (TYLENOL PM) 25-500 MG TABS tablet Take 2 tablets by mouth at bedtime.     Evolocumab with Infusor ([place]) 420 MG/3.5ML SOCT Inject 3.6 mLs into the skin every 30 (thirty) days. 3.6 mL 11   ezetimibe (ZETIA) 10 MG tablet Take 1 tablet (10 mg total) by mouth daily.  90 tablet 3   fluticasone (FLONASE) 50 MCG/ACT nasal spray Place 1 spray into both nostrils daily as needed for allergies.     isosorbide mononitrate  (IMDUR) 30 MG 24 hr tablet Take 1 tablet (30 mg total) by mouth daily. 30 tablet 1   levothyroxine (SYNTHROID, LEVOTHROID) 50 MCG tablet Take 50 mcg by mouth daily before breakfast.     metoprolol tartrate (LOPRESSOR) 25 MG tablet Take 1 tablet (25 mg total) by mouth 2 (two) times daily. 90 tablet 3   Multiple Vitamins-Minerals (EYE VITAMINS & MINERALS PO) Take 1 tablet by mouth 2 (two) times daily.     pantoprazole (PROTONIX) 20 MG tablet Take 20 mg by mouth daily.     ranolazine (RANEXA) 500 MG 12 hr tablet Take 1 tablet (500 mg total) by mouth 2 (two) times daily. 60 tablet 4   rOPINIRole (REQUIP) 2 MG tablet Take 2 mg by mouth at bedtime.     traMADol (ULTRAM) 50 MG tablet Take 50 mg by mouth daily.     valsartan (DIOVAN) 80 MG tablet Take 1 tablet (80 mg total) by mouth daily. 90 tablet 3   nitroGLYCERIN (NITROSTAT) 0.4 MG SL tablet Dissolve 1 tablet under the tongue every 5 minutes as needed for chest pain. Max of 3 doses, then 911. 25 tablet 6   No current facility-administered medications for this visit.    Allergies:   Penicillins, Lincomycin, Naproxen sodium, Statins, Crestor [rosuvastatin], Latex, Lipitor [atorvastatin], and Tape    Social History:  The patient  reports that she has never smoked. She has never used smokeless tobacco. She reports that she does not drink alcohol and does not use drugs.   Family History:  The patient's family history includes CAD in her father; Coronary artery disease in her brother and sister; Stroke in her mother.    ROS:   Noted in current history, otherwise review of systems is negative.   Physical Exam: Blood pressure (!) 140/95, pulse (!) 49, height '4\' 8"'$  (1.422 m), weight 119 lb 9.6 oz (54.3 kg), SpO2 96 %.  HYPERTENSION CONTROL Vitals:   10/24/22 0944 10/24/22 1011  BP: (!) 148/84 (!) 140/95    The patient's blood pressure is elevated above target today.  In order to address the patient's elevated BP: A current anti-hypertensive  medication was adjusted today.       GEN:  Well nourished, well developed in no acute distress HEENT: Normal NECK: No JVD; No carotid bruits LYMPHATICS: No lymphadenopathy CARDIAC: RRR ,  soft systolic murmur,  healed sternotomy  RESPIRATORY:  Clear to auscultation without rales, wheezing or rhonchi  ABDOMEN:   pulsatile abdominal aorta  , + bruit MUSCULOSKELETAL:  No edema; No deformity  SKIN: Warm and dry NEUROLOGIC:  Alert and oriented x 3    Recent Labs: 07/28/2022: ALT 11; Hemoglobin 11.4; Magnesium 2.0; Platelets 411; TSH 1.109 07/29/2022: BUN 28; Creatinine, Ser 0.98; Potassium 4.2; Sodium 128    Lipid Panel    Component Value Date/Time   CHOL 294 (H) 09/30/2022 0853   TRIG 189 (H) 09/30/2022 0853   HDL 63 09/30/2022 0853   CHOLHDL 4.7 (H) 09/30/2022 0853   CHOLHDL 4.9 07/18/2022 0553   VLDL 29 07/18/2022 0553   LDLCALC 196 (H) 09/30/2022 0853      Wt Readings from Last 3 Encounters:  10/24/22 119 lb 9.6 oz (54.3 kg)  09/06/22 116 lb 12.8 oz (53 kg)  08/01/22 119 lb (54 kg)  Other studies Reviewed: Additional studies/ records that were reviewed today include: . Review of the above records demonstrates:    ASSESSMENT AND PLAN:  1. Coronary artery disease:   hx of CABG      2. Pure  Hyperlipidemia :   on repatha.  Did not tolerate creastor or bempadoic acid / zetia combo  Start a separate prescription for Zetia        3.  Peripheral arterial disease:   has a pulsatile abdominal aorta. Will get an abd duplex scan  Has had a CTA of her abdominal aorta 2020 .  No AAA found at that time   Will follow up with Dr. Fletcher Anon         3. HTN:     DC lisinopril. Starte valsartan 80 mg a day BMP in 3 weeks     4. Fatigue:      Current medicines are reviewed at length with the patient today.  The patient does not have concerns regarding medicines.  Labs/ tests ordered today include:   Orders Placed This Encounter  Procedures   Basic metabolic  panel   VAS [place] AAA DUPLEX     Disposition:   Follow up with me in 3 months     Mertie Moores, MD  10/24/2022 11:48 AM    [address], [place], [place]  [address] Phone: [phone]; Fax: [phone]

## 2022-10-24 ENCOUNTER — Ambulatory Visit: Payer: Medicare Other | Attending: Cardiovascular Disease | Admitting: Cardiovascular Disease

## 2022-10-24 ENCOUNTER — Encounter: Payer: Self-pay | Admitting: Cardiovascular Disease

## 2022-10-24 VITALS — BP 140/95 | HR 49 | Ht <= 58 in | Wt 119.6 lb

## 2022-10-24 DIAGNOSIS — E782 Mixed hyperlipidemia: Secondary | ICD-10-CM | POA: Diagnosis not present

## 2022-10-24 DIAGNOSIS — Z79899 Other long term (current) drug therapy: Secondary | ICD-10-CM

## 2022-10-24 DIAGNOSIS — R198 Other specified symptoms and signs involving the digestive system and abdomen: Secondary | ICD-10-CM

## 2022-10-24 MED ORDER — VALSARTAN 80 MG PO TABS: 80.0000 mg | ORAL_TABLET | Freq: Every day | ORAL | 3 refills | Status: DC

## 2022-10-24 MED ORDER — EZETIMIBE 10 MG PO TABS: 10.0000 mg | ORAL_TABLET | Freq: Every day | ORAL | 3 refills | Status: DC

## 2022-10-24 MED ORDER — NITROGLYCERIN 0.4 MG SL SUBL: SUBLINGUAL_TABLET | SUBLINGUAL | 6 refills | Status: AC

## 2022-10-24 NOTE — Telephone Encounter (Signed)
Called patient, no answer, LVM  Will reach out to patient via MyChart

## 2022-10-24 NOTE — Patient Instructions (Signed)
Medication Instructions:  STOP Crestor (Rosuvastatin) STOP Lisinopril STOP Bempedoic Acid/Ezetimibe START Zetia (ezetimibe) '10mg'$  daily START Valsartan '80mg'$  daily *If you need a refill on your cardiac medications before your next appointment, please call your pharmacy*   Lab Work: BMET in 2-3 weeks If you have labs (blood work) drawn today and your tests are completely normal, you will receive your results only by: [place] (if you have MyChart) OR A paper copy in the mail If you have any lab test that is abnormal or we need to change your treatment, we will call you to review the results.  Testing/Procedures: Abdominal aortic duplex/AAA scan Your physician has requested that you have an abdominal aorta duplex. During this test, an ultrasound is used to evaluate the aorta. Allow 30 minutes for this exam. Do not eat after midnight the day before and avoid carbonated beverages  Follow-Up: At [facility], you and your health needs are our priority.  As part of our continuing mission to provide you with exceptional heart care, we have created designated Provider Care Teams.  These Care Teams include your primary Cardiologist (physician) and Advanced Practice Providers (APPs -  Physician Assistants and Nurse Practitioners) who all work together to provide you with the care you need, when you need it.  We recommend signing up for the patient portal called "MyChart".  Sign up information is provided on this After Visit Summary.  MyChart is used to connect with patients for Virtual Visits (Telemedicine).  Patients are able to view lab/test results, encounter notes, upcoming appointments, etc.  Non-urgent messages can be sent to your provider as well.   To learn more about what you can do with MyChart, go to [email or web address].    Your next appointment:   3 month(s)  The format for your next appointment:   In Person  Provider:   Mertie Moores, MD       Important  Information About Sugar

## 2022-11-08 ENCOUNTER — Ambulatory Visit (HOSPITAL_COMMUNITY)
Admission: RE | Admit: 2022-11-08 | Discharge: 2022-11-08 | Disposition: A | Discharge status: Home / Self Care | Payer: Medicare Other | Source: Ambulatory Visit | Attending: Cardiovascular Disease | Admitting: Cardiovascular Disease

## 2022-11-08 DIAGNOSIS — Z79899 Other long term (current) drug therapy: Secondary | ICD-10-CM | POA: Insufficient documentation

## 2022-11-08 DIAGNOSIS — E782 Mixed hyperlipidemia: Secondary | ICD-10-CM | POA: Diagnosis not present

## 2022-11-08 DIAGNOSIS — R198 Other specified symptoms and signs involving the digestive system and abdomen: Secondary | ICD-10-CM | POA: Insufficient documentation

## 2022-11-14 ENCOUNTER — Ambulatory Visit: Payer: Medicare Other | Attending: Cardiovascular Disease

## 2022-11-14 DIAGNOSIS — R198 Other specified symptoms and signs involving the digestive system and abdomen: Secondary | ICD-10-CM

## 2022-11-14 DIAGNOSIS — Z79899 Other long term (current) drug therapy: Secondary | ICD-10-CM

## 2022-11-14 DIAGNOSIS — E782 Mixed hyperlipidemia: Secondary | ICD-10-CM

## 2022-11-15 LAB — BASIC METABOLIC PANEL
BUN/Creatinine Ratio: 19 (ref 12–28)
BUN: 16 mg/dL (ref 8–27)
CO2: 25 mmol/L (ref 20–29)
Calcium: 9.4 mg/dL (ref 8.7–10.3)
Chloride: 101 mmol/L (ref 96–106)
Creatinine, Ser: 0.86 mg/dL (ref 0.57–1.00)
Glucose: 105 mg/dL — ABNORMAL HIGH (ref 70–99)
Potassium: 4.7 mmol/L (ref 3.5–5.2)
Sodium: 138 mmol/L (ref 134–144)
eGFR: 68 mL/min/{1.73_m2} (ref >59)

## 2022-11-30 ENCOUNTER — Other Ambulatory Visit: Payer: Self-pay | Admitting: Cardiovascular Disease

## 2022-12-26 ENCOUNTER — Other Ambulatory Visit (HOSPITAL_COMMUNITY): Payer: Self-pay

## 2023-01-11 ENCOUNTER — Other Ambulatory Visit: Payer: Self-pay | Admitting: Cardiovascular Disease

## 2023-01-11 DIAGNOSIS — E7800 Pure hypercholesterolemia, unspecified: Secondary | ICD-10-CM

## 2023-01-11 DIAGNOSIS — E78019 Familial hypercholesterolemia, unspecified: Secondary | ICD-10-CM

## 2023-01-11 DIAGNOSIS — I739 Peripheral vascular disease, unspecified: Secondary | ICD-10-CM

## 2023-01-11 DIAGNOSIS — M791 Myalgia, unspecified site: Secondary | ICD-10-CM

## 2023-01-11 DIAGNOSIS — I25119 Atherosclerotic heart disease of native coronary artery with unspecified angina pectoris: Secondary | ICD-10-CM

## 2023-01-11 DIAGNOSIS — T466X5A Adverse effect of antihyperlipidemic and antiarteriosclerotic drugs, initial encounter: Secondary | ICD-10-CM

## 2023-01-11 DIAGNOSIS — E7801 Familial hypercholesterolemia: Secondary | ICD-10-CM

## 2023-01-12 NOTE — Telephone Encounter (Signed)
Refill request

## 2023-01-24 ENCOUNTER — Ambulatory Visit: Payer: Medicare Other | Admitting: Cardiovascular Disease

## 2023-01-31 ENCOUNTER — Encounter: Payer: Self-pay | Admitting: Cardiovascular Disease

## 2023-01-31 NOTE — Progress Notes (Addendum)
Cardiology Office Note   Date:  02/01/2023   ID:  Katherine, Hall November 20, 1941, MRN [id number]  PCP:  Katherine Dolly, DO  Cardiologist:   Kristeen Miss, MD , [facility]   Chief Complaint  Patient presents with   Coronary Artery Disease        Problem list 1. Coronary artery disease-status post coronary artery bypass grafting-  CORONARY ARTERY BYPASS GRAFTING x 5 -LIMA to LAD -SVG to DIAGONAL -SVG to PDA -SEQ SVG OM1 and OM2  2. Hypertension 3. Hyperlipidemia 4. Anemia     Katherine Hall is a 82 y.o. female who presents for follow-up of her coronary artery disease. She had coronary artery bypass grafting in August. 2017   She has had a moderate left pleural effusion Still gradually improving . Needs an order to do cardiac rehab.  W[facility] to [facility]   Breathing is better.  Still has some chest soreness,  The angina is better   Has very high lipids.   Cannot tolerate statins.  Has seen Margaretmary Dys to initiate Repatha - working on that  Still sleeping in a recliner  Jan. 8, 2018:  Katherine Hall is doing well. She had coronary artery bypass grafting in August, 2017: Is not doing cardiac rehab.  Has lots of leg pain  Needs to have surgery on her right knee Has SVG harvest site pain .  Does not take her BP regularly  Aug. 22, 2018:  [name] is still having some chest pains. Described as a discomfort.  Last 5 minutes. She does not know if it worsens with twisting or taking a deep breath  Is not exercising ,.   May 01, 2018: Seen for follow-up of her coronary artery disease.  She is status post coronary artery bypass grafting in August, 2017.  Also has a history of hypertension and hyperlipidemia. No cp.   Not exercising much  Has PVD,   Had a coated stent placed.  Complicated by a significant groin bleed Required transfusion of 2 u PRBS   Some various aches and pains .  Has some left neck pain .   Lasts for a few seconds.  Has had right  knee  surgery since I last saw her.   No CP or dyspnea , Gets fatigued easily . Does not sleep well   Dec. 2, 2019:  Not exercising  Leg pain  No CP  Has arthrisit and PAD  Her Lipids are very high. Did well with Repatha but she couldn't afford it  Is in the Clear trial  But couldn't tolerate the Bempidoic acid    Aug. 25, 2020  Doing well Has some pain in her left leg.  Pain increases with waking  Concerned with PAD  Is in a cholesterol study so we do not have chol levels since 2018.   March 12, 2020:  Katherine Hall  was seen today for a follow-up of her hyperlipidemia, coronary artery disease, leg pain. Lipids are still elevated.    Nov. 8, 2021: Katherine Hall was seen today for follow up of her HLD, CAD, PAD  Has been referred to Lipid clinic and has seen Dr. Rennis Golden. BP has been elevated.   BP readings at home have been elevated. I reviewed her BP log.     Went to the ER with CP , left shoulder pain . BP was elevated  She is on hydrochlorothiazide 12.5 mg a day and lisinopril 20 mg twice a day.  I  reviewed her diet.   Does not eat much salt.  .  Lipids have continued to be elevated.   She is seeing Dr. Rennis Golden and is working on getting into a study for a higher dose Repatha once a month.    March 15, 2021: Katherine Hall is seen today for follow up of her CAD/ CABG, HLD, PAD  Cath on February 03, 2021: Severe native CAD LIMA - LAD patent SVG- OM1/OM3 - patent SVG -D1 - 60% stenosis SVG - PDA - sequential 90%, 95%, 90%  stenosis PCI attempt was unsuccessful.   Resulted in closue of the SVG and a small inf. MI  She was treated medically and did well .  Has had to take several NTG since that time .   She has claudication and will follow up with Dr. Kirke Corin Still having lots of claudication.   She is scheduled  She sees Dr. Rennis Golden for HLD.   Does not tolerate statins and zetia  Did not get a desired response from Repatha. Considered Inclisiran - she wants to take this  Will reach out to Dr.  Rennis Golden    Apr 04, 2022:  Shylan is seen today for follow-up of her coronary artery disease, peripheral arterial disease and hyperlipidemia. Felt an unusual sensation in her right leg several weeks ago .  Has has some chest twinges, and chest heaviness.   Also has some episodes of shortness of breath   Seems to have had a reaction to Repatha - nausea , vomitting   Nov. 20, 2023 Katherine Hall is seen for follow up of her CAD, CABG, PAD, HLD  Her home BP readings are mildly elevated.  Gave up processed meat after her CABG .     Is not feeling well  Took NTG last Sunday  Had a 2nd episode of CP - sharp jabbing pain  Occasionally radiates through to the intrascapular pain  Does not tolerate rosuvastatin   Had repeat cath in Aug.   Has an occluded RCA and SVG to the RCA    February 01, 2023 Katherine Hall is feeling well.  She has a history of coronary artery disease status post coronary artery bypass grafting. She has a history of peripheral vascular disease. Has been on Repatha since her last visit    Is not feeling well  Complains that her HR is very irreg.   Is not able to exercise / does not exercise   Does not feel like exercising  No CP ,   Some chest twinges , Takes NTG twice during the last month  Complains of some palpitations On exam , she appears to be having frequent PVCs     Past Medical History:  Diagnosis Date   AKI (acute kidney injury) (HCC) 07/28/2022   Anemia    Arthritis    Coronary artery disease    s/p CABG in 2017 // Myoview 11/21: No ischemia or infarction, EF > 65, low risk  // [place] >> cath 3/22: S-RPDA w severe disease - PCI unsuccessful & c/b peri-procedure MI (acute closure w wire manipulation>>inf ST elev) >> Med Rx    Echocardiogram 02/2021    EF 65-70, no RWMA, mild LVH, Gr 1 DD, normal RVSF, RVSP 19.5, mild AI, mild AV sclerosis   Familial hypercholesterolemia    a. h/o intolerance of statins, prev on Repatha but insurance stopped covering.   Gastric  ulcer    a. h/o bleeding ulcer 2009 requiring 3 pints of blood.   History of removal  of cyst    a. 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   HTN (hypertension)    Hypothyroidism    PAD (peripheral artery disease) (HCC)    S/p L SFA PTA // ABIs/LE arterial US 4/22: R 0.7; L 0.86 // R SFA 75-99; L SFA angioplasty site 30-49, distal 50-74 // ectatic distal aorta 2.6 x 2.6 cm   Prediabetes     Past Surgical History:  Procedure Laterality Date   ABDOMINAL AORTOGRAM W/LOWER EXTREMITY Bilateral 11/06/2019   Procedure: ABDOMINAL AORTOGRAM W/LOWER EXTREMITY;  Surgeon: Iran Ouch, MD;  Location: [facility];  Service: Cardiovascular;  Laterality: Bilateral;   ABDOMINAL AORTOGRAM W/LOWER EXTREMITY N/A 03/31/2021   Procedure: ABDOMINAL AORTOGRAM W/LOWER EXTREMITY;  Surgeon: Iran Ouch, MD;  Location: [facility];  Service: Cardiovascular;  Laterality: N/A;   ABDOMINAL AORTOGRAM W/LOWER EXTREMITY N/A 09/15/2021   Procedure: ABDOMINAL AORTOGRAM W/LOWER EXTREMITY;  Surgeon: Iran Ouch, MD;  Location: [facility];  Service: Cardiovascular;  Laterality: N/A;   ABDOMINAL HYSTERECTOMY     APPENDECTOMY     CARDIAC CATHETERIZATION N/A 07/05/2016   Procedure: Left Heart Cath and Coronary Angiography;  Surgeon: Kathleene Hazel, MD;  Location: [facility] INVASIVE CV LAB;  Service: Cardiovascular;  Laterality: N/A;   CARDIAC SURGERY     cyst, not on heart.   CORONARY ARTERY BYPASS GRAFT N/A 07/07/2016   Procedure: CORONARY ARTERY BYPASS GRAFTING (CABG) x 5 with endoscopic harvesting of the right greater saphenous vein;  Surgeon: Alleen Borne, MD;  Location: [facility] OR;  Service: Open Heart Surgery;  Laterality: N/A;   CORONARY BALLOON ANGIOPLASTY N/A 02/03/2021   Procedure: CORONARY BALLOON ANGIOPLASTY;  Surgeon: Iran Ouch, MD;  Location: [facility];  Service: Cardiovascular;  Laterality: N/A;  svg to pda   ESOPHAGOGASTRODUODENOSCOPY (EGD)  WITH PROPOFOL N/A 05/08/2021   Procedure: ESOPHAGOGASTRODUODENOSCOPY (EGD) WITH PROPOFOL;  Surgeon: Willis Modena, MD;  Location: [facility] ENDOSCOPY;  Service: Endoscopy;  Laterality: N/A;   HEMORRHOID SURGERY     INTRAOPERATIVE TRANSESOPHAGEAL ECHOCARDIOGRAM N/A 07/07/2016   Procedure: INTRAOPERATIVE TRANSESOPHAGEAL ECHOCARDIOGRAM;  Surgeon: Alleen Borne, MD;  Location: [facility] OR;  Service: Open Heart Surgery;  Laterality: N/A;   LEFT HEART CATH AND CORS/GRAFTS ANGIOGRAPHY N/A 02/03/2021   Procedure: LEFT HEART CATH AND CORS/GRAFTS ANGIOGRAPHY;  Surgeon: Iran Ouch, MD;  Location: [facility];  Service: Cardiovascular;  Laterality: N/A;   LEFT HEART CATH AND CORS/GRAFTS ANGIOGRAPHY N/A 07/18/2022   Procedure: LEFT HEART CATH AND CORS/GRAFTS ANGIOGRAPHY;  Surgeon: [place], Peter M, MD;  Location: [facility] INVASIVE CV LAB;  Service: Cardiovascular;  Laterality: N/A;   PERIPHERAL VASCULAR ATHERECTOMY Left 09/15/2021   Procedure: PERIPHERAL VASCULAR ATHERECTOMY;  Surgeon: Iran Ouch, MD;  [facility]: [facility];  Service: Cardiovascular;  Laterality: Left;   PERIPHERAL VASCULAR BALLOON ANGIOPLASTY  11/06/2019   Procedure: PERIPHERAL VASCULAR BALLOON ANGIOPLASTY;  Surgeon: Iran Ouch, MD;  Location: [facility];  Service: Cardiovascular;;  cutting and dcb   PERIPHERAL VASCULAR BALLOON ANGIOPLASTY Right 03/31/2021   Procedure: PERIPHERAL VASCULAR BALLOON ANGIOPLASTY;  Surgeon: Iran Ouch, MD;  Location: [facility];  Service: Cardiovascular;  Laterality: Right;  SFA   SHOULDER SURGERY       Current Outpatient Medications  Medication Sig Dispense Refill   acyclovir (ZOVIRAX) 400 MG tablet Take 400 mg by mouth daily as needed (for blister outbreaks per patient).  Carboxymethylcellul-Glycerin (LUBRICATING EYE DROPS OP) Place 1 drop into both eyes daily as needed (dry eyes).     clopidogrel (PLAVIX) 75 MG tablet Take 1 tablet (75 mg total) by mouth daily. Resume only  after 5 days ( on 05/13/21) 90 tablet 3   diphenhydramine-acetaminophen (TYLENOL PM) 25-500 MG TABS tablet Take 2 tablets by mouth at bedtime.     ezetimibe (ZETIA) 10 MG tablet Take 1 tablet (10 mg total) by mouth daily. 90 tablet 3   fluticasone (FLONASE) 50 MCG/ACT nasal spray Place 1 spray into both nostrils daily as needed for allergies.     isosorbide mononitrate (IMDUR) 30 MG 24 hr tablet Take 1 tablet (30 mg total) by mouth daily. 30 tablet 1   levothyroxine (SYNTHROID, LEVOTHROID) 50 MCG tablet Take 50 mcg by mouth daily before breakfast.     metoprolol tartrate (LOPRESSOR) 25 MG tablet TAKE 1 TABLET BY MOUTH TWICE  DAILY 180 tablet 3   Multiple Vitamins-Minerals (EYE VITAMINS & MINERALS PO) Take 1 tablet by mouth 2 (two) times daily.     nitroGLYCERIN (NITROSTAT) 0.4 MG SL tablet Dissolve 1 tablet under the tongue every 5 minutes as needed for chest pain. Max of 3 doses, then 911. 25 tablet 6   pantoprazole (PROTONIX) 20 MG tablet Take 20 mg by mouth daily.     ranolazine (RANEXA) 500 MG 12 hr tablet Take 1 tablet (500 mg total) by mouth 2 (two) times daily. 60 tablet 4   REPATHA PUSHTRONEX SYSTEM 420 MG/3.5ML SOCT APPLY ON-BODY INFUSOR AND INJECT 420MG  SUBCUTANEOUSLY ONCE  MONTHLY 10.5 mL 3   rOPINIRole (REQUIP) 2 MG tablet Take 2 mg by mouth at bedtime.     traMADol (ULTRAM) 50 MG tablet Take 50 mg by mouth daily.     valsartan (DIOVAN) 80 MG tablet Take 1 tablet (80 mg total) by mouth daily. 90 tablet 3   No current facility-administered medications for this visit.    Allergies:   Penicillins, Lincomycin, Naproxen sodium, Statins, Crestor [rosuvastatin], Latex, Lipitor [atorvastatin], and Tape    Social History:  The patient  reports that she has never smoked. She has never used smokeless tobacco. She reports that she does not drink alcohol and does not use drugs.   Family History:  The patient's family history includes CAD in her father; Coronary artery disease in her brother and  sister; Stroke in her mother.    ROS:   Noted in current history, otherwise review of systems is negative.   Physical Exam: Blood pressure 112/68, pulse (!) 57, height 4\' 8"  (1.422 m), weight 120 lb 6.4 oz (54.6 kg), SpO2 98 %.       GEN:  Well nourished, well developed in no acute distress HEENT: Normal NECK: No JVD; No carotid bruits LYMPHATICS: No lymphadenopathy CARDIAC: RRR ,  frequent premature beats c/w PVCs.  Is having PVCs every 4-6 beats  RESPIRATORY:  Clear to auscultation without rales, wheezing or rhonchi  ABDOMEN: Soft, non-tender, non-distended MUSCULOSKELETAL:  No edema; No deformity  SKIN: Warm and dry NEUROLOGIC:  Alert and oriented x 3    Recent Labs: 07/28/2022: ALT 11; Hemoglobin 11.4; Magnesium 2.0; Platelets 411; TSH 1.109 11/14/2022: BUN 16; Creatinine, Ser 0.86; Potassium 4.7; Sodium 138    Lipid Panel    Component Value Date/Time   CHOL 294 (H) 09/30/2022 0853   TRIG 189 (H) 09/30/2022 0853   HDL 63 09/30/2022 0853   CHOLHDL 4.7 (H) 09/30/2022 0853   CHOLHDL 4.9 07/18/2022 0553  VLDL 29 07/18/2022 0553   LDLCALC 196 (H) 09/30/2022 0853      Wt Readings from Last 3 Encounters:  02/01/23 120 lb 6.4 oz (54.6 kg)  10/24/22 119 lb 9.6 oz (54.3 kg)  09/06/22 116 lb 12.8 oz (53 kg)      Other studies Reviewed: Additional studies/ records that were reviewed today include: . Review of the above records demonstrates:    ASSESSMENT AND PLAN:  1. Coronary artery disease:    continues to have intermittant cp.   Takes NTG rarely.  Has severe HLD      2. Pure  Hyperlipidemia :    is now on Repatha        3.  Peripheral arterial disease:           3. HTN:       well controlled.     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 She is bradycardic at baseline.   I have discussed with Dr. Elberta Fortis.  We will place a 14 day monitor to assess her PVC burden. He suggested amiodarone would likely be helpful if she is found to have  significant PVC burden.     Current medicines are reviewed at length with the patient today.  The patient does not have concerns regarding medicines.  Labs/ tests ordered today include:   Orders Placed This Encounter  Procedures   TSH   Lipid panel   ALT     Disposition:   Follow up with me in 1 year     Kristeen Miss, MD  02/01/2023 2:42 PM    [facility] [address] [place], [place], [place]  [address] Phone: [phone]; Fax: [phone]

## 2023-02-01 ENCOUNTER — Ambulatory Visit: Payer: Medicare Other | Attending: Cardiovascular Disease | Admitting: Cardiovascular Disease

## 2023-02-01 ENCOUNTER — Encounter: Payer: Self-pay | Admitting: Cardiovascular Disease

## 2023-02-01 VITALS — BP 112/68 | HR 57 | Ht <= 58 in | Wt 120.4 lb

## 2023-02-01 DIAGNOSIS — I10 Essential (primary) hypertension: Secondary | ICD-10-CM | POA: Diagnosis not present

## 2023-02-01 DIAGNOSIS — I25119 Atherosclerotic heart disease of native coronary artery with unspecified angina pectoris: Secondary | ICD-10-CM

## 2023-02-01 DIAGNOSIS — R079 Chest pain, unspecified: Secondary | ICD-10-CM | POA: Diagnosis not present

## 2023-02-01 DIAGNOSIS — R002 Palpitations: Secondary | ICD-10-CM | POA: Insufficient documentation

## 2023-02-01 NOTE — Patient Instructions (Signed)
Medication Instructions:  Your physician recommends that you continue on your current medications as directed. Please refer to the Current Medication list given to you today.  *If you need a refill on your cardiac medications before your next appointment, please call your pharmacy*   Lab Work: Lipids, ALT, TSH If you have labs (blood work) drawn today and your tests are completely normal, you will receive your results only by: [place] (if you have MyChart) OR A paper copy in the mail If you have any lab test that is abnormal or we need to change your treatment, we will call you to review the results.   Follow-Up: At [facility], you and your health needs are our priority.  As part of our continuing mission to provide you with exceptional heart care, we have created designated Provider Care Teams.  These Care Teams include your primary Cardiologist (physician) and Advanced Practice Providers (APPs -  Physician Assistants and Nurse Practitioners) who all work together to provide you with the care you need, when you need it.   Your next appointment:   1 year(s)  Provider:   Mertie Moores, MD

## 2023-02-02 ENCOUNTER — Ambulatory Visit: Payer: Medicare Other | Attending: Cardiovascular Disease

## 2023-02-02 ENCOUNTER — Telehealth: Payer: Self-pay | Admitting: Cardiovascular Disease

## 2023-02-02 DIAGNOSIS — I493 Ventricular premature depolarization: Secondary | ICD-10-CM

## 2023-02-02 DIAGNOSIS — I25119 Atherosclerotic heart disease of native coronary artery with unspecified angina pectoris: Secondary | ICD-10-CM

## 2023-02-02 LAB — LIPID PANEL
Chol/HDL Ratio: 2 ratio (ref 0.0–4.4)
Cholesterol, Total: 130 mg/dL (ref 100–199)
HDL: 64 mg/dL (ref >39)
LDL Chol Calc (NIH): 44 mg/dL (ref 0–99)
Triglycerides: 125 mg/dL (ref 0–149)
VLDL Cholesterol Cal: 22 mg/dL (ref 5–40)

## 2023-02-02 LAB — TSH: TSH: 1.64 u[IU]/mL (ref 0.450–4.500)

## 2023-02-02 LAB — ALT: ALT: 7 IU/L (ref 0–32)

## 2023-02-02 NOTE — Progress Notes (Unsigned)
Enrolled for [employer] to mail a ZIO XT long term holter monitor to the patients address on file.  

## 2023-02-02 NOTE — Telephone Encounter (Signed)
Nahser, Katherine Cheng, MD  Donnalee Curry K Katherine Hall was complaining about palpitations yesterday Please order a 14 day monitor to assess her PVC burden Will consider adding amiodarone if she has a significant PVC burden  Thanks  PN   Order placed at this time

## 2023-02-02 NOTE — Addendum Note (Signed)
Addended by: Thayer Headings on: 02/02/2023 08:13 AM   Modules accepted: Level of Service

## 2023-02-02 NOTE — Telephone Encounter (Signed)
-----   Message from Thayer Headings, MD sent at 02/02/2023  8:13 AM EST ----- Katherine Hall was complaining about palpitations yesterday  Please order a 14 day monitor to assess her PVC burden Will consider adding amiodarone if she has a significant PVC burden  Thanks  PN

## 2023-02-04 ENCOUNTER — Other Ambulatory Visit: Payer: Self-pay | Admitting: Cardiovascular Disease

## 2023-02-06 DIAGNOSIS — I493 Ventricular premature depolarization: Secondary | ICD-10-CM | POA: Diagnosis not present

## 2023-03-02 ENCOUNTER — Telehealth: Payer: Self-pay | Admitting: Cardiovascular Disease

## 2023-03-02 DIAGNOSIS — Z5181 Encounter for therapeutic drug level monitoring: Secondary | ICD-10-CM

## 2023-03-02 MED ORDER — AMIODARONE HCL 200 MG PO TABS: ORAL_TABLET | ORAL | 3 refills | Status: DC

## 2023-03-02 NOTE — Addendum Note (Signed)
Addended by: Ma Hillock on: 03/02/2023 10:16 AM   Modules accepted: Orders

## 2023-03-02 NOTE — Telephone Encounter (Signed)
-----   Message from Thayer Headings, MD sent at 03/01/2023  5:54 PM EDT ----- Frequent PVCs.   She can feel these heart rate irregularities and she does not exercise much because of these .  I have discussed this with Dr. Curt Bears.  She has CAD and is not a candidate for Flecainide.   Will try amiodarone 200 mg BID for 2 weeks then reduce to 200 mg a day  THS was normal on Feb 01, 2023 Please order repeat TSH in 1 year   Lets see her in 4 months to see how she is doing

## 2023-03-02 NOTE — Telephone Encounter (Signed)
Patient calling back stating she cannot get into her MyChart account. I supplied her the Username and changed the password to her requested password.

## 2023-03-02 NOTE — Telephone Encounter (Signed)
Patient was calling back to speak to the nurse. Please advise 

## 2023-03-02 NOTE — Telephone Encounter (Signed)
Called and spoke with patient who agrees to try the Amiodarone. Medication sent to pharmacy on file and f/u appt scheduled for 07/03/23.

## 2023-03-02 NOTE — Telephone Encounter (Signed)
Order placed for repeat TSH in 1 year. Lab scheduled for 02/05/24.

## 2023-04-07 ENCOUNTER — Other Ambulatory Visit: Payer: Self-pay | Admitting: Cardiovascular Disease

## 2023-04-24 ENCOUNTER — Ambulatory Visit (HOSPITAL_COMMUNITY)
Admission: RE | Admit: 2023-04-24 | Discharge: 2023-04-24 | Disposition: A | Discharge status: Home / Self Care | Payer: Medicare Other | Source: Ambulatory Visit | Attending: Cardiovascular Disease | Admitting: Cardiovascular Disease

## 2023-04-24 ENCOUNTER — Telehealth: Payer: Self-pay | Admitting: Cardiovascular Disease

## 2023-04-24 DIAGNOSIS — I739 Peripheral vascular disease, unspecified: Secondary | ICD-10-CM

## 2023-04-24 LAB — VAS US ABI WITH/WO TBI
Left ABI: 0.8
Right ABI: 0.41

## 2023-04-24 NOTE — Telephone Encounter (Signed)
Patient states while at the office today she dropped a bag with crochet needle(s) and material(s). She would like to know if it has been recovered. Please advise.

## 2023-04-24 NOTE — Telephone Encounter (Signed)
Spoke to patient your crochet needles were found.They are in our lost and found at front desk.Stated she will pick up on Wed 5/22.

## 2023-04-28 ENCOUNTER — Other Ambulatory Visit: Payer: Self-pay | Admitting: Cardiovascular Disease

## 2023-04-28 NOTE — Progress Notes (Unsigned)
Cardiology Office Note   Date:  05/02/2023   ID:  Chlora, Songco [birth year], MRN [id number]  PCP:  Patrcia Dolly, DO  Cardiologist: Dr. Elease Hashimoto  No chief complaint on file.      History of Present Illness: Katherine Hall is a 82 y.o. female who is here today for follow-up regarding peripheral arterial disease.   She has known history of coronary artery disease status post CABG in 2017, essential hypertension, anemia and hyperlipidemia with intolerance to statins.  She is not diabetic and is a lifelong non-smoker. She was hospitalized in March, 2022 with unstable angina.  Cardiac catheterization showed severe underlying three-vessel coronary artery disease with patent grafts including LIMA to LAD, SVG to OM1/OM 3, SVG to diagonal with 60% anastomosis stenosis and SVG to right PDA.  There was 90% stenosis in the proximal portion of the SVG to right PDA followed by another 90% stenosis leading into an aneurysmal segment which was followed by a 95% stenosis.  I attempted angioplasty of SVG to right PDA but I was not able to cross the stenosis with multiple wires beyond the aneurysmal segment.  Subsequently, there was loss of flow and acute closure with wire manipulation.  The patient was treated medically and did reasonably well.    She is known to have peripheral arterial disease with claudication.  She is status post bilateral SFA endovascular intervention with drug-coated balloon angioplasty.  She was hospitalized in June of 2022 with GI bleed requiring transfusion.   Plavix was held and then was resumed without aspirin.  No bleeding since then. She was hospitalized shortly after that with hypokalemia and hyponatremia.  Hydrochlorothiazide was discontinued.  She is now taking amiodarone for symptomatic PVCs.  She reports worsening right calf claudication which is happening after short distance walking.  No rest pain or lower extremity ulceration.  She usually has to rest for few  minutes before she can resume.  She underwent recent Doppler testing which showed a drop in ABI to 0.4 on the right side with evidence of new right popliteal artery occlusion.  Past Medical History:  Diagnosis Date   AKI (acute kidney injury) (HCC) 07/28/2022   Anemia    Arthritis    Coronary artery disease    s/p CABG in 2017 // Myoview 11/21: No ischemia or infarction, EF > 65, low risk  // [place] >> cath 3/22: S-RPDA w severe disease - PCI unsuccessful & c/b peri-procedure MI (acute closure w wire manipulation>>inf ST elev) >> Med Rx    Echocardiogram 02/2021    EF 65-70, no RWMA, mild LVH, Gr 1 DD, normal RVSF, RVSP 19.5, mild AI, mild AV sclerosis   Familial hypercholesterolemia    a. h/o intolerance of statins, prev on Repatha but insurance stopped covering.   Gastric ulcer    a. h/o bleeding ulcer 2009 requiring 3 pints of blood.   History of removal of cyst    a. 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   HTN (hypertension)    Hypothyroidism    PAD (peripheral artery disease) (HCC)    S/p L SFA PTA // ABIs/LE arterial US 4/22: R 0.7; L 0.86 // R SFA 75-99; L SFA angioplasty site 30-49, distal 50-74 // ectatic distal aorta 2.6 x 2.6 cm   Prediabetes     Past Surgical History:  Procedure Laterality Date   ABDOMINAL AORTOGRAM W/LOWER EXTREMITY Bilateral 11/06/2019   Procedure: ABDOMINAL AORTOGRAM W/LOWER EXTREMITY;  Surgeon:  Iran Ouch, MD;  Location: [facility];  Service: Cardiovascular;  Laterality: Bilateral;   ABDOMINAL AORTOGRAM W/LOWER EXTREMITY N/A 03/31/2021   Procedure: ABDOMINAL AORTOGRAM W/LOWER EXTREMITY;  Surgeon: Iran Ouch, MD;  Location: [facility];  Service: Cardiovascular;  Laterality: N/A;   ABDOMINAL AORTOGRAM W/LOWER EXTREMITY N/A 09/15/2021   Procedure: ABDOMINAL AORTOGRAM W/LOWER EXTREMITY;  Surgeon: Iran Ouch, MD;  Location: [facility];  Service: Cardiovascular;  Laterality: N/A;    ABDOMINAL HYSTERECTOMY     APPENDECTOMY     CARDIAC CATHETERIZATION N/A 07/05/2016   Procedure: Left Heart Cath and Coronary Angiography;  Surgeon: Kathleene Hazel, MD;  Location: [facility] INVASIVE CV LAB;  Service: Cardiovascular;  Laterality: N/A;   CARDIAC SURGERY     cyst, not on heart.   CORONARY ARTERY BYPASS GRAFT N/A 07/07/2016   Procedure: CORONARY ARTERY BYPASS GRAFTING (CABG) x 5 with endoscopic harvesting of the right greater saphenous vein;  Surgeon: Alleen Borne, MD;  Location: [facility];  Service: Open Heart Surgery;  Laterality: N/A;   CORONARY BALLOON ANGIOPLASTY N/A 02/03/2021   Procedure: CORONARY BALLOON ANGIOPLASTY;  Surgeon: Iran Ouch, MD;  Location: [facility];  Service: Cardiovascular;  Laterality: N/A;  svg to pda   ESOPHAGOGASTRODUODENOSCOPY (EGD) WITH PROPOFOL N/A 05/08/2021   Procedure: ESOPHAGOGASTRODUODENOSCOPY (EGD) WITH PROPOFOL;  Surgeon: Willis Modena, MD;  Location: [facility];  Service: Endoscopy;  Laterality: N/A;   HEMORRHOID SURGERY     INTRAOPERATIVE TRANSESOPHAGEAL ECHOCARDIOGRAM N/A 07/07/2016   Procedure: INTRAOPERATIVE TRANSESOPHAGEAL ECHOCARDIOGRAM;  Surgeon: Alleen Borne, MD;  Location: [facility];  Service: Open Heart Surgery;  Laterality: N/A;   LEFT HEART CATH AND CORS/GRAFTS ANGIOGRAPHY N/A 02/03/2021   Procedure: LEFT HEART CATH AND CORS/GRAFTS ANGIOGRAPHY;  Surgeon: Iran Ouch, MD;  Location: [facility];  Service: Cardiovascular;  Laterality: N/A;   LEFT HEART CATH AND CORS/GRAFTS ANGIOGRAPHY N/A 07/18/2022   Procedure: LEFT HEART CATH AND CORS/GRAFTS ANGIOGRAPHY;  Surgeon: [place], Peter M, MD;  Location: [facility] INVASIVE CV LAB;  Service: Cardiovascular;  Laterality: N/A;   PERIPHERAL VASCULAR ATHERECTOMY Left 09/15/2021   Procedure: PERIPHERAL VASCULAR ATHERECTOMY;  Surgeon: Iran Ouch, MD;  Location: [facility];  Service: Cardiovascular;  Laterality: Left;   PERIPHERAL VASCULAR BALLOON ANGIOPLASTY  11/06/2019    Procedure: PERIPHERAL VASCULAR BALLOON ANGIOPLASTY;  Surgeon: Iran Ouch, MD;  Location: [facility];  Service: Cardiovascular;;  cutting and dcb   PERIPHERAL VASCULAR BALLOON ANGIOPLASTY Right 03/31/2021   Procedure: PERIPHERAL VASCULAR BALLOON ANGIOPLASTY;  Surgeon: Iran Ouch, MD;  Location: [facility];  Service: Cardiovascular;  Laterality: Right;  SFA   SHOULDER SURGERY       Current Outpatient Medications  Medication Sig Dispense Refill   acyclovir (ZOVIRAX) 400 MG tablet Take 400 mg by mouth daily as needed (for blister outbreaks per patient).     amiodarone (PACERONE) 200 MG tablet Take 1 tablet by mouth twice daily for 2 weeks, then decrease to once daily thereafter 102 tablet 3   Carboxymethylcellul-Glycerin (LUBRICATING EYE DROPS OP) Place 1 drop into both eyes daily as needed (dry eyes).     cilostazol (PLETAL) 100 MG tablet Take 1 tablet (100 mg total) by mouth 2 (two) times daily. 180 tablet 1   clopidogrel (PLAVIX) 75 MG tablet Take 1 tablet (75 mg total) by mouth daily. Resume only after 5 days ( on 05/13/21) 90 tablet 3   diphenhydramine-acetaminophen (TYLENOL  PM) 25-500 MG TABS tablet Take 2 tablets by mouth at bedtime.     ezetimibe (ZETIA) 10 MG tablet Take 1 tablet (10 mg total) by mouth daily. 90 tablet 3   fluticasone (FLONASE) 50 MCG/ACT nasal spray Place 1 spray into both nostrils daily as needed for allergies.     isosorbide mononitrate (IMDUR) 30 MG 24 hr tablet Take 1 tablet (30 mg total) by mouth daily. 30 tablet 1   levothyroxine (SYNTHROID, LEVOTHROID) 50 MCG tablet Take 50 mcg by mouth daily before breakfast.     metoprolol tartrate (LOPRESSOR) 25 MG tablet TAKE 1 TABLET BY MOUTH TWICE  DAILY 180 tablet 3   Multiple Vitamins-Minerals (EYE VITAMINS & MINERALS PO) Take 1 tablet by mouth 2 (two) times daily.     nitroGLYCERIN (NITROSTAT) 0.4 MG SL tablet Dissolve 1 tablet under the tongue every 5 minutes as needed for chest pain. Max of 3  doses, then 911. 25 tablet 6   pantoprazole (PROTONIX) 20 MG tablet Take 20 mg by mouth daily.     ranolazine (RANEXA) 500 MG 12 hr tablet Take 1 tablet (500 mg total) by mouth 2 (two) times daily. 60 tablet 4   REPATHA PUSHTRONEX SYSTEM 420 MG/3.5ML SOCT APPLY ON-BODY INFUSOR AND INJECT 420MG  SUBCUTANEOUSLY ONCE  MONTHLY 10.5 mL 3   rOPINIRole (REQUIP) 2 MG tablet Take 2 mg by mouth at bedtime.     traMADol (ULTRAM) 50 MG tablet Take 50 mg by mouth daily.     valsartan (DIOVAN) 80 MG tablet Take 1 tablet (80 mg total) by mouth daily. 90 tablet 3   No current facility-administered medications for this visit.    Allergies:   Penicillins, Lincomycin, Naproxen sodium, Statins, Crestor [rosuvastatin], Latex, Lipitor [atorvastatin], and Tape    Social History:  The patient  reports that she has never smoked. She has never used smokeless tobacco. She reports that she does not drink alcohol and does not use drugs.   Family History:  The patient's family history includes CAD in her father; Coronary artery disease in her brother and sister; Stroke in her mother.    ROS:  Please see the history of present illness.   Otherwise, review of systems are positive for none.   All other systems are reviewed and negative.    PHYSICAL EXAM: VS:  BP 138/78 (BP Location: Left Arm, Patient Position: Sitting, Cuff Size: Normal)   Pulse (!) 58   Ht 4\' 8"  (1.422 m)   Wt 118 lb 6.4 oz (53.7 kg)   SpO2 98%   BMI 26.54 kg/m  , BMI Body mass index is 26.54 kg/m. GEN: Well nourished, well developed, in no acute distress  HEENT: normal  Neck: no JVD, carotid bruits, or masses Cardiac: RRR; no murmurs, rubs, or gallops,no edema  Respiratory:  clear to auscultation bilaterally, normal work of breathing GI: soft, nontender, nondistended, + BS MS: no deformity or atrophy  Skin: warm and dry, no rash Neuro:  Strength and sensation are intact Psych: euthymic mood, full affect   EKG:  EKG is ordered today. 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  Poor R wave progression in the anterior leads.   Recent Labs: 07/28/2022: Hemoglobin 11.4; Magnesium 2.0; Platelets 411 11/14/2022: BUN 16; Creatinine, Ser 0.86; Potassium 4.7; Sodium 138 02/01/2023: ALT 7; TSH 1.640    Lipid Panel    Component Value Date/Time   CHOL 130 02/01/2023 1446   TRIG 125 02/01/2023 1446   HDL 64 02/01/2023  1446   CHOLHDL 2.0 02/01/2023 1446   CHOLHDL 4.9 07/18/2022 0553   VLDL 29 07/18/2022 0553   LDLCALC 44 02/01/2023 1446      Wt Readings from Last 3 Encounters:  05/02/23 118 lb 6.4 oz (53.7 kg)  02/01/23 120 lb 6.4 oz (54.6 kg)  10/24/22 119 lb 9.6 oz (54.3 kg)           No data to display            ASSESSMENT AND PLAN:  1.  Peripheral arterial disease: Status post bilateral SFA intervention most recently left SFA orbital atherectomy and drug-coated balloon angioplasty.  She now has worsening right calf claudication which is severe.  This is due to a new occlusion in the right popliteal artery.  This is not an optimal location for endovascular intervention and considering her age and comorbidities I do not think a femoral-popliteal bypass is appropriate.   I elected to start her on cilostazol and recommended a walking exercise program.  Reevaluate her symptoms in 4 months.    2.  Coronary artery disease involving native coronary arteries without angina forms of angina: Status post CABG in 2017 .  Chronically occluded SVG to right PDA but other grafts are patent.  Continue medical therapy.  3. Familial hyperlipidemia with severely elevated LDL: Intolerance to statins.  He is doing well with Repatha with recent lipid profile showing an LDL of 44.  4.  Essential hypertension: Blood pressure is controlled.  5.  Symptomatic PVCs: Controlled with amiodarone.   Disposition:   FU with me in 4 months.  Signed,  Lorine Bears, MD  05/02/2023 1:11 PM    [facility]  [facility]

## 2023-05-02 ENCOUNTER — Ambulatory Visit: Payer: Medicare Other | Attending: Cardiovascular Disease | Admitting: Cardiovascular Disease

## 2023-05-02 ENCOUNTER — Encounter: Payer: Self-pay | Admitting: Cardiovascular Disease

## 2023-05-02 VITALS — BP 138/78 | HR 58 | Ht <= 58 in | Wt 118.4 lb

## 2023-05-02 DIAGNOSIS — I739 Peripheral vascular disease, unspecified: Secondary | ICD-10-CM | POA: Diagnosis not present

## 2023-05-02 DIAGNOSIS — I2510 Atherosclerotic heart disease of native coronary artery without angina pectoris: Secondary | ICD-10-CM | POA: Diagnosis not present

## 2023-05-02 DIAGNOSIS — E7849 Other hyperlipidemia: Secondary | ICD-10-CM

## 2023-05-02 DIAGNOSIS — I493 Ventricular premature depolarization: Secondary | ICD-10-CM

## 2023-05-02 DIAGNOSIS — I10 Essential (primary) hypertension: Secondary | ICD-10-CM

## 2023-05-02 MED ORDER — CILOSTAZOL 100 MG PO TABS: 100.0000 mg | ORAL_TABLET | Freq: Two times a day (BID) | ORAL | 1 refills | Status: DC

## 2023-05-02 NOTE — Patient Instructions (Addendum)
Medication Instructions:  START Cilostazol (Pletal)-for the first two weeks take 50 mg twice daily (half a tablet). After the two weeks, take 100 mg twice daily  *If you need a refill on your cardiac medications before your next appointment, please call your pharmacy*   Lab Work: None ordered If you have labs (blood work) drawn today and your tests are completely normal, you will receive your results only by: MyChart Message (if you have MyChart) OR A paper copy in the mail If you have any lab test that is abnormal or we need to change your treatment, we will call you to review the results.   Testing/Procedures: None ordered   Follow-Up: At [facility], you and your health needs are our priority.  As part of our continuing mission to provide you with exceptional heart care, we have created designated Provider Care Teams.  These Care Teams include your primary Cardiologist (physician) and Advanced Practice Providers (APPs -  Physician Assistants and Nurse Practitioners) who all work together to provide you with the care you need, when you need it.  We recommend signing up for the patient portal called "MyChart".  Sign up information is provided on this After Visit Summary.  MyChart is used to connect with patients for Virtual Visits (Telemedicine).  Patients are able to view lab/test results, encounter notes, upcoming appointments, etc.  Non-urgent messages can be sent to your provider as well.   To learn more about what you can do with MyChart, go to [email or web address].    Your next appointment:   4 month(s)  Provider:   Dr. Kirke Corin  Other Instructions EXERCISE PROGRAM FOR INDIVIDUALS WITH  PERIPHERAL ARTERIAL DISEASE (PAD)   General Information:   Research in vascular exercise has demonstrated remarkable improvement in symptoms of leg pain (claudication) without expensive or invasive interventions. Regular walking programs are extremely helpful for patients with PAD  and intermittent claudication.  These steps are designed to help you get started with a safe and effective program to help you walk farther with less pain:   Walk at least three times a week (preferably every day).  Your goal is to build up to 30-45 minutes of total walking time (not counting rest breaks). It may take you several weeks to build up your exercise time starting at 5-10 minutes or whatever you can tolerate.  Walk as far as possible using moderate to maximal pain (7-8 on the scale below) as a signal to stop, and resume walking when the pain goes away.  On a treadmill, set the speed and grade at a level that brings on the claudication pain within 3 to 5 minutes. Walk at this rate until you experience claudication of moderate severity, rest until the pain improves, and then resume walking.  Over time, you will be able to walk longer at the designated speed and grade; workload should then be increased until you develop the pain within 3 to 5 minutes once again.  This regimen will induce a significant benefit. Studies have demonstrated that participants may be able to walk up to three or four times farther and have less leg pain, within twelve weeks, by following this protocol.  Pain Scale    0_____1_____2_____3_____4_____5_____6_____7_____8_____9_____10   No Pain                                   Moderate Pain  Maximal Pain

## 2023-05-17 ENCOUNTER — Telehealth: Payer: Self-pay | Admitting: Pharmacist

## 2023-05-17 NOTE — Telephone Encounter (Signed)
Received fax from [facility] that Repatha pushtronix 420mg  is being discontinued. Pt cannot change to 140mg  sureclick dose because it contains latex and she has a latex allergy. She will need to change to Praluent 150mg  Q2W.

## 2023-05-18 ENCOUNTER — Other Ambulatory Visit (HOSPITAL_COMMUNITY): Payer: Self-pay

## 2023-05-18 ENCOUNTER — Telehealth: Payer: Self-pay

## 2023-05-18 NOTE — Telephone Encounter (Signed)
PA initiated, please see separate encounter for updates on determination. (I will route you back in once a decision has been made)  Azazel Franze, CPhT Pharmacy Patient Advocate Specialist Direct Number: [phone] Fax: [phone]  

## 2023-05-18 NOTE — Telephone Encounter (Signed)
Pharmacy Patient Advocate Encounter  Prior Authorization for PRALUENT has been approved.    Effective dates: 05/18/23 through 11/17/23  Haze Rushing, CPhT Pharmacy Patient Advocate Specialist Direct Number: [phone] Fax: [phone]

## 2023-05-18 NOTE — Telephone Encounter (Signed)
Pharmacy Patient Advocate Encounter   Received notification from [facility] MEDICARE that prior authorization for PRALUENT is needed.    PA submitted on 05/18/23 Key [id number] Status is pending  Haze Rushing, CPhT Pharmacy Patient Advocate Specialist Direct Number: [phone] Fax: [phone]

## 2023-05-19 MED ORDER — NEXLIZET 180-10 MG PO TABS: 1.0000 | ORAL_TABLET | Freq: Every day | ORAL | 3 refills | Status: DC

## 2023-05-19 MED ORDER — PRALUENT 150 MG/ML ~~LOC~~ SOAJ: 150.0000 mg | SUBCUTANEOUS | 3 refills | Status: DC

## 2023-05-19 NOTE — Addendum Note (Signed)
Addended by: Penda Venturi E on: 05/19/2023 09:12 AM   Modules accepted: Orders

## 2023-05-19 NOTE — Telephone Encounter (Signed)
Left message for pt.  Need to advise her Repatha pushtronix is no longer being manufacturered and that her rx will change to Praluent 150mg  Q2W (Repatha 140mg  is not latex free and she has a latex allergy).   Will need to send new rx sent to pharmacy and counsel pt on injection technique.

## 2023-05-19 NOTE — Telephone Encounter (Signed)
Noted, documenting in other open encounter.

## 2023-05-19 NOTE — Telephone Encounter (Addendum)
Spoke with pt, she is aware of med change. Has 3 Repatha pushtronix left and will use those up first, then start on Praluent 1 month after last Repatha pushtronix. She'll call if she has any trouble with new injection formulation.  Pt also has a question about another med. Reports she just paid $102 for a 3 month supply of cilostazol yesterday at [facility]. GoodRx coupon would decrease this cost to ~$39. Called [facility] to add on coupon and see if they're able to reprocess rx from yesterday and reimburse pt. Pharmacy stated her cilostazol was < $10 for a 3 month supply but her Nexlizet was $102 for a 1 month supply. We don't have this on her med list, we have ezetimibe on it instead.  I have updated her med list and re-enrolled her in [facility] grant which will work for both Praluent and Nexlizet, however she'll need to pick up her Praluent from [facility].  CARD # [id number]   BIN [id number]   PCN [id number]   GROUP [id number]  She will show grant info to pharmacy for future fills, I have called in info to them as well. Also will fill out reimbursement request form for pt, she will drop off a copy of her receipt to [facility] office and let front desk staff know to bring it back to the PharmD.

## 2023-05-20 ENCOUNTER — Other Ambulatory Visit: Payer: Self-pay | Admitting: Cardiovascular Disease

## 2023-06-02 NOTE — Telephone Encounter (Signed)
Pt dropped off Nexlizet receipt to clinic - I have faxed this along with reimbursement form to [facility]. Left message for pt making her aware, also advised on message for her to follow up with them in ~2 weeks if she hasn't heard any updates by then.

## 2023-07-01 ENCOUNTER — Encounter: Payer: Self-pay | Admitting: Cardiovascular Disease

## 2023-07-01 NOTE — Progress Notes (Unsigned)
Cardiology Office Note   Date:  07/03/2023   ID:  Katherine Hall, Katherine Hall 1941-10-22, MRN [id number]  PCP:  Patrcia Dolly, DO  Cardiologist:   Kristeen Miss, MD , [facility]   Chief Complaint  Patient presents with   Coronary Artery Disease        Problem list 1. Coronary artery disease-status post coronary artery bypass grafting-  CORONARY ARTERY BYPASS GRAFTING x 5 -LIMA to LAD -SVG to DIAGONAL -SVG to PDA -SEQ SVG OM1 and OM2  2. Hypertension 3. Hyperlipidemia 4. Anemia     Katherine Hall is a 82 y.o. female who presents for follow-up of her coronary artery disease. She had coronary artery bypass grafting in August. 2017   She has had a moderate left pleural effusion Still gradually improving . Needs an order to do cardiac rehab.  Wants to do heart strides at [facility]   Breathing is better.  Still has some chest soreness,  The angina is better   Has very high lipids.   Cannot tolerate statins.  Has seen Margaretmary Dys to initiate Repatha - working on that  Still sleeping in a recliner  Jan. 8, 2018:  Katherine Hall is doing well. She had coronary artery bypass grafting in August, 2017: Is not doing cardiac rehab.  Has lots of leg pain  Needs to have surgery on her right knee Has SVG harvest site pain .  Does not take her BP regularly  Aug. 22, 2018:  Katherine Hall is still having some chest pains. Described as a discomfort.  Last 5 minutes. She does not know if it worsens with twisting or taking a deep breath  Is not exercising ,.   May 01, 2018: Seen for follow-up of her coronary artery disease.  She is status post coronary artery bypass grafting in August, 2017.  Also has a history of hypertension and hyperlipidemia. No cp.   Not exercising much  Has PVD,   Had a coated stent placed.  Complicated by a significant groin bleed Required transfusion of 2 u PRBS   Some various aches and pains .  Has some left neck pain .   Lasts for a few seconds.  Has had right  knee  surgery since I last saw her.   No CP or dyspnea , Gets fatigued easily . Does not sleep well   Dec. 2, 2019:  Not exercising  Leg pain  No CP  Has arthrisit and PAD  Her Lipids are very high. Did well with Repatha but she couldn't afford it  Is in the Clear trial  But couldn't tolerate the Bempidoic acid    Aug. 25, 2020  Doing well Has some pain in her left leg.  Pain increases with waking  Concerned with PAD  Is in a cholesterol study so we do not have chol levels since 2018.   March 12, 2020:  Katherine Hall  was seen today for a follow-up of her hyperlipidemia, coronary artery disease, leg pain. Lipids are still elevated.    Nov. 8, 2021: Katherine Hall was seen today for follow up of her HLD, CAD, PAD  Has been referred to Lipid clinic and has seen Dr. Rennis Golden. BP has been elevated.   BP readings at home have been elevated. I reviewed her BP log.     Went to the ER with CP , left shoulder pain . BP was elevated  She is on hydrochlorothiazide 12.5 mg a day and lisinopril 20 mg twice a day.  I  reviewed her diet.   Does not eat much salt.  .  Lipids have continued to be elevated.   She is seeing Dr. Rennis Golden and is working on getting into a study for a higher dose Repatha once a month.    March 15, 2021: Katherine Hall is seen today for follow up of her CAD/ CABG, HLD, PAD  Cath on February 03, 2021: Severe native CAD LIMA - LAD patent SVG- OM1/OM3 - patent SVG -D1 - 60% stenosis SVG - PDA - sequential 90%, 95%, 90%  stenosis PCI attempt was unsuccessful.   Resulted in closue of the SVG and a small inf. MI  She was treated medically and did well .  Has had to take several NTG since that time .   She has claudication and will follow up with Dr. Kirke Corin Still having lots of claudication.   She is scheduled  She sees Dr. Rennis Golden for HLD.   Does not tolerate statins and zetia  Did not get a desired response from Repatha. Considered Inclisiran - she wants to take this  Will reach out to Dr.  Rennis Golden    Apr 04, 2022:  Katherine Hall is seen today for follow-up of her coronary artery disease, peripheral arterial disease and hyperlipidemia. Felt an unusual sensation in her right leg several weeks ago .  Has has some chest twinges, and chest heaviness.   Also has some episodes of shortness of breath   Seems to have had a reaction to Repatha - nausea , vomitting   Nov. 20, 2023 Katherine Hall is seen for follow up of her CAD, CABG, PAD, HLD  Her home BP readings are mildly elevated.  Gave up processed meat after her CABG .     Is not feeling well  Took NTG last Sunday  Had a 2nd episode of CP - sharp jabbing pain  Occasionally radiates through to the intrascapular pain  Does not tolerate rosuvastatin   Had repeat cath in Aug.   Has an occluded RCA and SVG to the RCA    February 01, 2023 Katherine Hall is feeling well.  She has a history of coronary artery disease status post coronary artery bypass grafting. She has a history of peripheral vascular disease. Has been on Repatha since her last visit    Is not feeling well  Complains that her HR is very irreg.   Is not able to exercise / does not exercise   Does not feel like exercising  No CP ,   Some chest twinges , Takes NTG twice during the last month  Complains of some palpitations On exam , she appears to be having frequent PVCs  July 03, 2023  Katherine Hall IS  seen for follow up of her CAD, CABG, HLD , is on Repatha  PVCs She has severe CAD with frequent episodes of angina  She has frequent PVCs on event monitr We started amiodarone   10 days ago ahe deveoped CP  Took 3 NTG,  the pain eventually resolved.   She has angina several times a week typically  Told her she should have gone to the hospital She has not had any further episodes since that time  Her BP is too low to increase her IMdur   She has severe weakness and dizziness with her ADLs and housework  She is able to cook occasionally  She does some limited housework  but gives out quickly    Has severe PAD       Past Medical  History:  Diagnosis Date   AKI (acute kidney injury) (HCC) 07/28/2022   Anemia    Arthritis    Coronary artery disease    s/p CABG in 2017 // Myoview 11/21: No ischemia or infarction, EF > 65, low risk  // [place] >> cath 3/22: S-RPDA w severe disease - PCI unsuccessful & c/b peri-procedure MI (acute closure w wire manipulation>>inf ST elev) >> Med Rx    Echocardiogram 02/2021    EF 65-70, no RWMA, mild LVH, Gr 1 DD, normal RVSF, RVSP 19.5, mild AI, mild AV sclerosis   Familial hypercholesterolemia    a. h/o intolerance of statins, prev on Repatha but insurance stopped covering.   Gastric ulcer    a. h/o bleeding ulcer 2009 requiring 3 pints of blood.   History of removal of cyst    a. 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   HTN (hypertension)    Hypothyroidism    PAD (peripheral artery disease) (HCC)    S/p L SFA PTA // ABIs/LE arterial US 4/22: R 0.7; L 0.86 // R SFA 75-99; L SFA angioplasty site 30-49, distal 50-74 // ectatic distal aorta 2.6 x 2.6 cm   Prediabetes     Past Surgical History:  Procedure Laterality Date   ABDOMINAL AORTOGRAM W/LOWER EXTREMITY Bilateral 11/06/2019   Procedure: ABDOMINAL AORTOGRAM W/LOWER EXTREMITY;  Surgeon: Iran Ouch, MD;  Location: [facility];  Service: Cardiovascular;  Laterality: Bilateral;   ABDOMINAL AORTOGRAM W/LOWER EXTREMITY N/A 03/31/2021   Procedure: ABDOMINAL AORTOGRAM W/LOWER EXTREMITY;  Surgeon: Iran Ouch, MD;  Location: [facility];  Service: Cardiovascular;  Laterality: N/A;   ABDOMINAL AORTOGRAM W/LOWER EXTREMITY N/A 09/15/2021   Procedure: ABDOMINAL AORTOGRAM W/LOWER EXTREMITY;  Surgeon: Iran Ouch, MD;  Location: [facility];  Service: Cardiovascular;  Laterality: N/A;   ABDOMINAL HYSTERECTOMY     APPENDECTOMY     CARDIAC CATHETERIZATION N/A 07/05/2016   Procedure: Left Heart Cath and Coronary  Angiography;  Surgeon: Kathleene Hazel, MD;  Location: [facility] INVASIVE CV LAB;  Service: Cardiovascular;  Laterality: N/A;   CARDIAC SURGERY     cyst, not on heart.   CORONARY ARTERY BYPASS GRAFT N/A 07/07/2016   Procedure: CORONARY ARTERY BYPASS GRAFTING (CABG) x 5 with endoscopic harvesting of the right greater saphenous vein;  Surgeon: Alleen Borne, MD;  Location: [facility];  Service: Open Heart Surgery;  Laterality: N/A;   CORONARY BALLOON ANGIOPLASTY N/A 02/03/2021   Procedure: CORONARY BALLOON ANGIOPLASTY;  Surgeon: Iran Ouch, MD;  Location: [facility];  Service: Cardiovascular;  Laterality: N/A;  svg to pda   ESOPHAGOGASTRODUODENOSCOPY (EGD) WITH PROPOFOL N/A 05/08/2021   Procedure: ESOPHAGOGASTRODUODENOSCOPY (EGD) WITH PROPOFOL;  Surgeon: Willis Modena, MD;  Location: [facility];  Service: Endoscopy;  Laterality: N/A;   HEMORRHOID SURGERY     INTRAOPERATIVE TRANSESOPHAGEAL ECHOCARDIOGRAM N/A 07/07/2016   Procedure: INTRAOPERATIVE TRANSESOPHAGEAL ECHOCARDIOGRAM;  Surgeon: Alleen Borne, MD;  Location: [facility];  Service: Open Heart Surgery;  Laterality: N/A;   LEFT HEART CATH AND CORS/GRAFTS ANGIOGRAPHY N/A 02/03/2021   Procedure: LEFT HEART CATH AND CORS/GRAFTS ANGIOGRAPHY;  Surgeon: Iran Ouch, MD;  Location: [facility];  Service: Cardiovascular;  Laterality: N/A;   LEFT HEART CATH AND CORS/GRAFTS ANGIOGRAPHY N/A 07/18/2022   Procedure: LEFT HEART CATH AND CORS/GRAFTS ANGIOGRAPHY;  Surgeon: [place], Peter M, MD;  Location: [facility] INVASIVE CV LAB;  Service: Cardiovascular;  Laterality: N/A;  PERIPHERAL VASCULAR ATHERECTOMY Left 09/15/2021   Procedure: PERIPHERAL VASCULAR ATHERECTOMY;  Surgeon: Iran Ouch, MD;  Location: [facility];  Service: Cardiovascular;  Laterality: Left;   PERIPHERAL VASCULAR BALLOON ANGIOPLASTY  11/06/2019   Procedure: PERIPHERAL VASCULAR BALLOON ANGIOPLASTY;  Surgeon: Iran Ouch, MD;  Location: [facility];  Service:  Cardiovascular;;  cutting and dcb   PERIPHERAL VASCULAR BALLOON ANGIOPLASTY Right 03/31/2021   Procedure: PERIPHERAL VASCULAR BALLOON ANGIOPLASTY;  Surgeon: Iran Ouch, MD;  Location: [facility];  Service: Cardiovascular;  Laterality: Right;  SFA   SHOULDER SURGERY       Current Outpatient Medications  Medication Sig Dispense Refill   acyclovir (ZOVIRAX) 400 MG tablet Take 400 mg by mouth daily as needed (for blister outbreaks per patient).     Alirocumab (PRALUENT) 150 MG/ML SOAJ Inject 1 mL (150 mg total) into the skin every 14 (fourteen) days. 6 mL 3   amiodarone (PACERONE) 200 MG tablet Take 1 tablet by mouth twice daily for 2 weeks, then decrease to once daily thereafter 102 tablet 3   Bempedoic Acid-Ezetimibe (NEXLIZET) 180-10 MG TABS Take 1 tablet by mouth daily. 90 tablet 3   Carboxymethylcellul-Glycerin (LUBRICATING EYE DROPS OP) Place 1 drop into both eyes daily as needed (dry eyes).     cilostazol (PLETAL) 100 MG tablet Take 1 tablet (100 mg total) by mouth 2 (two) times daily. 180 tablet 1   clopidogrel (PLAVIX) 75 MG tablet Take 1 tablet (75 mg total) by mouth daily. Resume only after 5 days ( on 05/13/21) 90 tablet 3   diphenhydramine-acetaminophen (TYLENOL PM) 25-500 MG TABS tablet Take 2 tablets by mouth at bedtime.     fluticasone (FLONASE) 50 MCG/ACT nasal spray Place 1 spray into both nostrils daily as needed for allergies.     isosorbide mononitrate (IMDUR) 30 MG 24 hr tablet Take 1 tablet (30 mg total) by mouth daily. 30 tablet 1   levothyroxine (SYNTHROID, LEVOTHROID) 50 MCG tablet Take 50 mcg by mouth daily before breakfast.     metoprolol tartrate (LOPRESSOR) 25 MG tablet TAKE 1 TABLET BY MOUTH TWICE  DAILY 180 tablet 3   Multiple Vitamins-Minerals (EYE VITAMINS & MINERALS PO) Take 1 tablet by mouth 2 (two) times daily.     nitroGLYCERIN (NITROSTAT) 0.4 MG SL tablet Dissolve 1 tablet under the tongue every 5 minutes as needed for chest pain. Max of 3 doses,  then 911. 25 tablet 6   pantoprazole (PROTONIX) 20 MG tablet Take 20 mg by mouth daily.     ranolazine (RANEXA) 500 MG 12 hr tablet Take 1 tablet (500 mg total) by mouth 2 (two) times daily. 60 tablet 4   rOPINIRole (REQUIP) 2 MG tablet Take 2 mg by mouth at bedtime.     traMADol (ULTRAM) 50 MG tablet Take 50 mg by mouth daily.     valsartan (DIOVAN) 80 MG tablet Take 1 tablet (80 mg total) by mouth daily. 90 tablet 3   No current facility-administered medications for this visit.    Allergies:   Penicillins, Lincomycin, Naproxen sodium, Statins, Crestor [rosuvastatin], Latex, Lipitor [atorvastatin], and Tape    Social History:  The patient  reports that she has never smoked. She has never used smokeless tobacco. She reports that she does not drink alcohol and does not use drugs.   Family History:  The patient's family history includes CAD in her father; Coronary artery disease in her brother and sister; Stroke in her mother.  ROS:   Noted in current history, otherwise review of systems is negative.   Physical Exam: Blood pressure 100/72, pulse (!) 59, height 4\' 8"  (1.422 m), weight 118 lb 12.8 oz (53.9 kg), SpO2 98%.       GEN:  elderly female  in no acute distress HEENT: Normal NECK: No JVD; No carotid bruits LYMPHATICS: No lymphadenopathy CARDIAC: RRR ,  soft systolic murmur  RESPIRATORY:  Clear to auscultation without rales, wheezing or rhonchi  ABDOMEN: pulsitile abdominal aorta  MUSCULOSKELETAL:  No edema; No deformity  SKIN: Warm and dry NEUROLOGIC:  Alert and oriented x 3   Recent Labs: 07/28/2022: Hemoglobin 11.4; Magnesium 2.0; Platelets 411 11/14/2022: BUN 16; Creatinine, Ser 0.86; Potassium 4.7; Sodium 138 02/01/2023: ALT 7; TSH 1.640    Lipid Panel    Component Value Date/Time   CHOL 130 02/01/2023 1446   TRIG 125 02/01/2023 1446   HDL 64 02/01/2023 1446   CHOLHDL 2.0 02/01/2023 1446   CHOLHDL 4.9 07/18/2022 0553   VLDL 29 07/18/2022 0553   LDLCALC 44  02/01/2023 1446      Wt Readings from Last 3 Encounters:  07/03/23 118 lb 12.8 oz (53.9 kg)  05/02/23 118 lb 6.4 oz (53.7 kg)  02/01/23 120 lb 6.4 oz (54.6 kg)      Other studies Reviewed: Additional studies/ records that were reviewed today include: . Review of the above records demonstrates:    ASSESSMENT AND PLAN:  1. Coronary artery disease:      Has severe CAD, Had severe angina 10 days ago.   Took 3 NTG with eventual relief of her pain  BP is too low to increase Imdur   Her last LDL is 44  Will have her see Dr. Kirke Corin upon my retirement      2. Pure  Hyperlipidemia :    is now on Repatha,  will need to change to Praluent       3.  Peripheral arterial disease:    Has AAA  Has mild dilatation of her abdomina aorta .        3. HTN:      fairly low .  Unable to add imdur      4. Frequent PVCs:       Current medicines are reviewed at length with the patient today.  The patient does not have concerns regarding medicines.  Labs/ tests ordered today include:   No orders of the defined types were placed in this encounter.    Disposition:   Follow up with me in 1 year   Seh will follow up with Dr. Kirke Corin when I retire .     Kristeen Miss, MD  07/03/2023 1:27 PM    [facility] [address] [place], [place], [place]  [address] Phone: [phone]; Fax: [phone]

## 2023-07-03 ENCOUNTER — Encounter: Payer: Self-pay | Admitting: Cardiovascular Disease

## 2023-07-03 ENCOUNTER — Ambulatory Visit: Payer: Medicare Other | Attending: Cardiovascular Disease | Admitting: Cardiovascular Disease

## 2023-07-03 VITALS — BP 100/72 | HR 59 | Ht <= 58 in | Wt 118.8 lb

## 2023-07-03 DIAGNOSIS — I739 Peripheral vascular disease, unspecified: Secondary | ICD-10-CM

## 2023-07-03 DIAGNOSIS — I714 Abdominal aortic aneurysm, without rupture, unspecified: Secondary | ICD-10-CM

## 2023-07-03 DIAGNOSIS — I2510 Atherosclerotic heart disease of native coronary artery without angina pectoris: Secondary | ICD-10-CM

## 2023-07-03 NOTE — Patient Instructions (Signed)
Medication Instructions:  Your physician recommends that you continue on your current medications as directed. Please refer to the Current Medication list given to you today.  *If you need a refill on your cardiac medications before your next appointment, please call your pharmacy*   Lab Work: NONE If you have labs (blood work) drawn today and your tests are completely normal, you will receive your results only by: MyChart Message (if you have MyChart) OR A paper copy in the mail If you have any lab test that is abnormal or we need to change your treatment, we will call you to review the results.   Testing/Procedures: NONE   Follow-Up: At [facility], you and your health needs are our priority.  As part of our continuing mission to provide you with exceptional heart care, we have created designated Provider Care Teams.  These Care Teams include your primary Cardiologist (physician) and Advanced Practice Providers (APPs -  Physician Assistants and Nurse Practitioners) who all work together to provide you with the care you need, when you need it.  We recommend signing up for the patient portal called "MyChart".  Sign up information is provided on this After Visit Summary.  MyChart is used to connect with patients for Virtual Visits (Telemedicine).  Patients are able to view lab/test results, encounter notes, upcoming appointments, etc.  Non-urgent messages can be sent to your provider as well.   To learn more about what you can do with MyChart, go to [email or web address].    Your next appointment:   6 month(s)  Provider:   Jari Favre, PA-C, Ronie Spies, PA-C, Robin Searing, NP, Eligha Bridegroom, NP, Tereso Newcomer, PA-C, or Perlie Gold, PA-C     Then, Kristeen Miss, MD will plan to see you again in 1 year(s).

## 2023-07-10 ENCOUNTER — Telehealth: Payer: Self-pay | Admitting: Pharmacist

## 2023-07-10 NOTE — Telephone Encounter (Signed)
Called pt and left message - wanted to confirm that she received reimbursement from [facility] for her Nexlizet fill (see phone note 05/17/23 for details).

## 2023-07-13 ENCOUNTER — Other Ambulatory Visit: Payer: Self-pay | Admitting: Cardiovascular Disease

## 2023-07-13 DIAGNOSIS — E782 Mixed hyperlipidemia: Secondary | ICD-10-CM

## 2023-07-13 DIAGNOSIS — R198 Other specified symptoms and signs involving the digestive system and abdomen: Secondary | ICD-10-CM

## 2023-07-13 DIAGNOSIS — Z79899 Other long term (current) drug therapy: Secondary | ICD-10-CM

## 2023-08-01 ENCOUNTER — Other Ambulatory Visit: Payer: Self-pay | Admitting: Cardiovascular Disease

## 2023-08-08 ENCOUNTER — Other Ambulatory Visit: Payer: Self-pay

## 2023-08-08 MED ORDER — CILOSTAZOL 100 MG PO TABS: 100.0000 mg | ORAL_TABLET | Freq: Two times a day (BID) | ORAL | 3 refills | Status: AC

## 2023-08-17 ENCOUNTER — Other Ambulatory Visit: Payer: Self-pay | Admitting: Cardiovascular Disease

## 2023-08-29 ENCOUNTER — Ambulatory Visit: Payer: Medicare Other | Attending: Cardiovascular Disease | Admitting: Cardiovascular Disease

## 2023-08-29 ENCOUNTER — Encounter: Payer: Self-pay | Admitting: Cardiovascular Disease

## 2023-08-29 VITALS — BP 174/82 | HR 62 | Ht <= 58 in | Wt 115.0 lb

## 2023-08-29 DIAGNOSIS — E7849 Other hyperlipidemia: Secondary | ICD-10-CM

## 2023-08-29 DIAGNOSIS — I493 Ventricular premature depolarization: Secondary | ICD-10-CM

## 2023-08-29 DIAGNOSIS — I10 Essential (primary) hypertension: Secondary | ICD-10-CM

## 2023-08-29 DIAGNOSIS — I25118 Atherosclerotic heart disease of native coronary artery with other forms of angina pectoris: Secondary | ICD-10-CM

## 2023-08-29 DIAGNOSIS — I739 Peripheral vascular disease, unspecified: Secondary | ICD-10-CM | POA: Diagnosis not present

## 2023-08-29 MED ORDER — ISOSORBIDE MONONITRATE ER 60 MG PO TB24: 60.0000 mg | ORAL_TABLET | Freq: Every day | ORAL | 1 refills | Status: DC

## 2023-08-29 NOTE — Patient Instructions (Signed)
Medication Instructions:  INCREASE the Imdur to 60 mg once daily  Take the Cilostazol (Pletal) twice daily  *If you need a refill on your cardiac medications before your next appointment, please call your pharmacy*   Lab Work: None ordered If you have labs (blood work) drawn today and your tests are completely normal, you will receive your results only by: MyChart Message (if you have MyChart) OR A paper copy in the mail If you have any lab test that is abnormal or we need to change your treatment, we will call you to review the results.   Testing/Procedures: None ordered   Follow-Up: At [facility], you and your health needs are our priority.  As part of our continuing mission to provide you with exceptional heart care, we have created designated Provider Care Teams.  These Care Teams include your primary Cardiologist (physician) and Advanced Practice Providers (APPs -  Physician Assistants and Nurse Practitioners) who all work together to provide you with the care you need, when you need it.  We recommend signing up for the patient portal called "MyChart".  Sign up information is provided on this After Visit Summary.  MyChart is used to connect with patients for Virtual Visits (Telemedicine).  Patients are able to view lab/test results, encounter notes, upcoming appointments, etc.  Non-urgent messages can be sent to your provider as well.   To learn more about what you can do with MyChart, go to [email or web address].    Your next appointment:   6 month(s)  Provider:   Dr. Kirke Corin  Other Instructions EXERCISE PROGRAM FOR INDIVIDUALS WITH  PERIPHERAL ARTERIAL DISEASE (PAD)   General Information:   Research in vascular exercise has demonstrated remarkable improvement in symptoms of leg pain (claudication) without expensive or invasive interventions. Regular walking programs are extremely helpful for patients with PAD and intermittent claudication.  These steps are  designed to help you get started with a safe and effective program to help you walk farther with less pain:   Walk at least three times a week (preferably every day).  Your goal is to build up to 30-45 minutes of total walking time (not counting rest breaks). It may take you several weeks to build up your exercise time starting at 5-10 minutes or whatever you can tolerate.  Walk as far as possible using moderate to maximal pain (7-8 on the scale below) as a signal to stop, and resume walking when the pain goes away.  On a treadmill, set the speed and grade at a level that brings on the claudication pain within 3 to 5 minutes. Walk at this rate until you experience claudication of moderate severity, rest until the pain improves, and then resume walking.  Over time, you will be able to walk longer at the designated speed and grade; workload should then be increased until you develop the pain within 3 to 5 minutes once again.  This regimen will induce a significant benefit. Studies have demonstrated that participants may be able to walk up to three or four times farther and have less leg pain, within twelve weeks, by following this protocol.  Pain Scale    0_____1_____2_____3_____4_____5_____6_____7_____8_____9_____10   No Pain                                   Moderate Pain  Maximal Pain

## 2023-08-29 NOTE — Progress Notes (Signed)
Cardiology Office Note   Date:  08/29/2023   ID:  Katherine Hall, Katherine Hall 01-03-1941, MRN [id number]  PCP:  Patrcia Dolly, DO  Cardiologist: Dr. Elease Hashimoto  No chief complaint on file.      History of Present Illness: Katherine Hall is a 82 y.o. female who is here today for follow-up regarding peripheral arterial disease.   She has known history of coronary artery disease status post CABG in 2017, essential hypertension, anemia and hyperlipidemia with intolerance to statins.  She is not diabetic and is a lifelong non-smoker. She was hospitalized in March, 2022 with unstable angina.  Cardiac catheterization showed severe underlying three-vessel coronary artery disease with patent grafts including LIMA to LAD, SVG to OM1/OM 3, SVG to diagonal with 60% anastomosis stenosis and SVG to right PDA.  There was 90% stenosis in the proximal portion of the SVG to right PDA followed by another 90% stenosis leading into an aneurysmal segment which was followed by a 95% stenosis.  I attempted angioplasty of SVG to right PDA but I was not able to cross the stenosis with multiple wires beyond the aneurysmal segment.  Subsequently, there was loss of flow and acute closure with wire manipulation.  The patient was treated medically and did reasonably well.    She is known to have peripheral arterial disease with claudication.  She is status post bilateral SFA endovascular intervention with drug-coated balloon angioplasty.  She was hospitalized in June of 2022 with GI bleed requiring transfusion.   Plavix was held and then was resumed without aspirin.  No bleeding since then. She was hospitalized shortly after that with hypokalemia and hyponatremia.  Hydrochlorothiazide was discontinued.  She is now taking amiodarone for symptomatic PVCs.    Most recent lower extremity arterial Doppler testing which showed a drop in ABI to 0.4 on the right side with evidence of new right popliteal artery occlusion into the TP  trunk.  I started on cilostazol and had minimal improvement but she has been taking the medication once daily due to headaches that happened with initiation of treatment.  She is mostly bothered by increased angina.  Chest pain usually responds to nitroglycerin.  Past Medical History:  Diagnosis Date   AKI (acute kidney injury) (HCC) 07/28/2022   Anemia    Arthritis    Coronary artery disease    s/p CABG in 2017 // Myoview 11/21: No ischemia or infarction, EF > 65, low risk  // [place] >> cath 3/22: S-RPDA w severe disease - PCI unsuccessful & c/b peri-procedure MI (acute closure w wire manipulation>>inf ST elev) >> Med Rx    Echocardiogram 02/2021    EF 65-70, no RWMA, mild LVH, Gr 1 DD, normal RVSF, RVSP 19.5, mild AI, mild AV sclerosis   Familial hypercholesterolemia    a. h/o intolerance of statins, prev on Repatha but insurance stopped covering.   Gastric ulcer    a. h/o bleeding ulcer 2009 requiring 3 pints of blood.   History of removal of cyst    a. 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   HTN (hypertension)    Hypothyroidism    PAD (peripheral artery disease) (HCC)    S/p L SFA PTA // ABIs/LE arterial US 4/22: R 0.7; L 0.86 // R SFA 75-99; L SFA angioplasty site 30-49, distal 50-74 // ectatic distal aorta 2.6 x 2.6 cm   Prediabetes     Past Surgical History:  Procedure Laterality Date  ABDOMINAL AORTOGRAM W/LOWER EXTREMITY Bilateral 11/06/2019   Procedure: ABDOMINAL AORTOGRAM W/LOWER EXTREMITY;  Surgeon: Iran Ouch, MD;  Location: [facility];  Service: Cardiovascular;  Laterality: Bilateral;   ABDOMINAL AORTOGRAM W/LOWER EXTREMITY N/A 03/31/2021   Procedure: ABDOMINAL AORTOGRAM W/LOWER EXTREMITY;  Surgeon: Iran Ouch, MD;  Location: [facility];  Service: Cardiovascular;  Laterality: N/A;   ABDOMINAL AORTOGRAM W/LOWER EXTREMITY N/A 09/15/2021   Procedure: ABDOMINAL AORTOGRAM W/LOWER EXTREMITY;  Surgeon: Iran Ouch,  MD;  Location: [facility];  Service: Cardiovascular;  Laterality: N/A;   ABDOMINAL HYSTERECTOMY     APPENDECTOMY     CARDIAC CATHETERIZATION N/A 07/05/2016   Procedure: Left Heart Cath and Coronary Angiography;  Surgeon: Kathleene Hazel, MD;  Location: [facility] INVAS[facility] CV [facility];  Service: Cardiovascular;  Laterality: N/A;   CARDIAC SURGERY     cyst, not on heart.   CORONARY ARTERY BYPASS GRAFT N/A 07/07/2016   Procedure: CORONARY ARTERY BYPASS GRAFTING (CABG) x 5 with endoscopic harvesting of the right greater saphenous vein;  Surgeon: Alleen Borne, MD;  Location: [facility];  Service: Open Heart Surgery;  Laterality: N/A;   CORONARY BALLOON ANGIOPLASTY N/A 02/03/2021   Procedure: CORONARY BALLOON ANGIOPLASTY;  Surgeon: Iran Ouch, MD;  Location: [facility];  Service: Cardiovascular;  Laterality: N/A;  svg to pda   ESOPHAGOGASTRODUODENOSCOPY (EGD) WITH PROPOFOL N/A 05/08/2021   Procedure: ESOPHAGOGASTRODUODENOSCOPY (EGD) WITH PROPOFOL;  Surgeon: Willis Modena, MD;  Location: [facility];  Service: Endoscopy;  Laterality: N/A;   HEMORRHOID SURGERY     INTRAOPERATIVE TRANSESOPHAGEAL ECHOCARDIOGRAM N/A 07/07/2016   Procedure: INTRAOPERATIVE TRANSESOPHAGEAL ECHOCARDIOGRAM;  Surgeon: Alleen Borne, MD;  Location: [facility];  Service: Open Heart Surgery;  Laterality: N/A;   LEFT HEART CATH AND CORS/GRAFTS ANGIOGRAPHY N/A 02/03/2021   Procedure: LEFT HEART CATH AND CORS/GRAFTS ANGIOGRAPHY;  Surgeon: Iran Ouch, MD;  Location: [facility];  Service: Cardiovascular;  Laterality: N/A;   LEFT HEART CATH AND CORS/GRAFTS ANGIOGRAPHY N/A 07/18/2022   Procedure: LEFT HEART CATH AND CORS/GRAFTS ANGIOGRAPHY;  Surgeon: [place], Peter M, MD;  Location: [facility] INVASIVE CV LAB;  Service: Cardiovascular;  Laterality: N/A;   PERIPHERAL VASCULAR ATHERECTOMY Left 09/15/2021   Procedure: PERIPHERAL VASCULAR ATHERECTOMY;  Surgeon: Iran Ouch, MD;  Location: [facility];  Service:  Cardiovascular;  Laterality: Left;   PERIPHERAL VASCULAR BALLOON ANGIOPLASTY  11/06/2019   Procedure: PERIPHERAL VASCULAR BALLOON ANGIOPLASTY;  Surgeon: Iran Ouch, MD;  Location: [facility];  Service: Cardiovascular;;  cutting and dcb   PERIPHERAL VASCULAR BALLOON ANGIOPLASTY Right 03/31/2021   Procedure: PERIPHERAL VASCULAR BALLOON ANGIOPLASTY;  Surgeon: Iran Ouch, MD;  Location: [facility];  Service: Cardiovascular;  Laterality: Right;  SFA   SHOULDER SURGERY       Current Outpatient Medications  Medication Sig Dispense Refill   acyclovir (ZOVIRAX) 400 MG tablet Take 400 mg by mouth daily as needed (for blister outbreaks per patient).     Alirocumab (PRALUENT) 150 MG/ML SOAJ Inject 1 mL (150 mg total) into the skin every 14 (fourteen) days. 6 mL 3   amiodarone (PACERONE) 200 MG tablet Take 1 tablet by mouth twice daily for 2 weeks, then decrease to once daily thereafter 102 tablet 3   Bempedoic Acid-Ezetimibe (NEXLIZET) 180-10 MG TABS Take 1 tablet by mouth daily. 90 tablet 3   Carboxymethylcellul-Glycerin (LUBRICATING EYE DROPS OP) Place 1 drop into both eyes daily as needed (dry eyes).  cilostazol (PLETAL) 100 MG tablet Take 1 tablet (100 mg total) by mouth 2 (two) times daily. 180 tablet 3   clopidogrel (PLAVIX) 75 MG tablet Take 1 tablet (75 mg total) by mouth daily. Resume only after 5 days ( on 05/13/21) 90 tablet 3   diphenhydramine-acetaminophen (TYLENOL PM) 25-500 MG TABS tablet Take 2 tablets by mouth at bedtime.     fluticasone (FLONASE) 50 MCG/ACT nasal spray Place 1 spray into both nostrils daily as needed for allergies.     levothyroxine (SYNTHROID, LEVOTHROID) 50 MCG tablet Take 50 mcg by mouth daily before breakfast.     metoprolol tartrate (LOPRESSOR) 25 MG tablet TAKE 1 TABLET BY MOUTH TWICE  DAILY 180 tablet 3   Multiple Vitamins-Minerals (EYE VITAMINS & MINERALS PO) Take 1 tablet by mouth 2 (two) times daily.     nitroGLYCERIN (NITROSTAT)  0.4 MG SL tablet Dissolve 1 tablet under the tongue every 5 minutes as needed for chest pain. Max of 3 doses, then 911. 25 tablet 6   pantoprazole (PROTONIX) 20 MG tablet Take 20 mg by mouth daily.     ranolazine (RANEXA) 500 MG 12 hr tablet Take 1 tablet (500 mg total) by mouth 2 (two) times daily. 60 tablet 4   rOPINIRole (REQUIP) 2 MG tablet Take 2 mg by mouth at bedtime.     traMADol (ULTRAM) 50 MG tablet Take 50 mg by mouth daily.     valsartan (DIOVAN) 80 MG tablet TAKE 1 TABLET BY MOUTH EVERY DAY 90 tablet 3   isosorbide mononitrate (IMDUR) 60 MG 24 hr tablet Take 1 tablet (60 mg total) by mouth daily. 90 tablet 1   No current facility-administered medications for this visit.    Allergies:   Penicillins, Lincomycin, Naproxen sodium, Statins, Crestor [rosuvastatin], Latex, Lipitor [atorvastatin], and Tape    Social History:  The patient  reports that she has never smoked. She has never used smokeless tobacco. She reports that she does not drink alcohol and does not use drugs.   Family History:  The patient's family history includes CAD in her father; Coronary artery disease in her brother and sister; Stroke in her mother.    ROS:  Please see the history of present illness.   Otherwise, review of systems are positive for none.   All other systems are reviewed and negative.    PHYSICAL EXAM: VS:  BP (!) 174/82 (BP Location: Right Arm, Patient Position: Sitting, Cuff Size: Normal)   Pulse 62   Ht 4\' 8"  (1.422 m)   Wt 115 lb (52.2 kg)   SpO2 95%   BMI 25.78 kg/m  , BMI Body mass index is 25.78 kg/m. GEN: Well nourished, well developed, in no acute distress  HEENT: normal  Neck: no JVD, carotid bruits, or masses Cardiac: RRR; no murmurs, rubs, or gallops,no edema  Respiratory:  clear to auscultation bilaterally, normal work of breathing GI: soft, nontender, nondistended, + BS MS: no deformity or atrophy  Skin: warm and dry, no rash Neuro:  Strength and sensation are  intact Psych: euthymic mood, full affect   EKG:  EKG is ordered today. EKG showed : Sinus bradycardia Possible Inferior infarct , age undetermined Cannot rule out Anterior infarct , age undetermined When compared with ECG of 28-Jul-2022 12:34, PREVIOUS ECG IS PRESENT    Recent Labs: 11/14/2022: BUN 16; Creatinine, Ser 0.86; Potassium 4.7; Sodium 138 02/01/2023: ALT 7; TSH 1.640    Lipid Panel    Component Value Date/Time   CHOL  130 02/01/2023 1446   TRIG 125 02/01/2023 1446   HDL 64 02/01/2023 1446   CHOLHDL 2.0 02/01/2023 1446   CHOLHDL 4.9 07/18/2022 0553   VLDL 29 07/18/2022 0553   LDLCALC 44 02/01/2023 1446      Wt Readings from Last 3 Encounters:  08/29/23 115 lb (52.2 kg)  07/03/23 118 lb 12.8 oz (53.9 kg)  05/02/23 118 lb 6.4 oz (53.7 kg)           No data to display            ASSESSMENT AND PLAN:  1.  Peripheral arterial disease: Status post bilateral SFA intervention most recently left SFA orbital atherectomy and drug-coated balloon angioplasty.   She continues to have severe right calf claudication due to new occlusion of the right popliteal artery into the TP trunk.  No evidence of critical limb ischemia.   I asked her to start taking cilostazol twice daily instead of once daily.   No good endovascular options.  Reserve revascularization for worsening ischemia.  2.  Coronary artery disease involving native coronary arteries with worsening angina: EKG with no ischemic changes.  Most recent carotid PET in August of last year showed inferior wall ischemia.  That was followed by cardiac catheterization that was personally reviewed by me.  All her grafts were patent except SVG to right PDA.  Her native right coronary artery is diffusely diseased and very tortuous.  Not suitable for PCI.  I favor maximizing medical therapy before repeating her cardiac catheterization.  I increased Imdur to 60 mg daily.   3. Familial hyperlipidemia with severely elevated  LDL: Intolerance to statins.  He is doing well with Repatha with recent lipid profile showing an LDL of 44.  4.  Essential hypertension: Blood pressure is elevated.  Imdur was increased.  5.  Symptomatic PVCs: Controlled with amiodarone.   Disposition:   FU with me in 6 months.  Signed,  Lorine Bears, MD  08/29/2023 12:58 PM    [facility]

## 2023-09-15 ENCOUNTER — Other Ambulatory Visit: Payer: Self-pay | Admitting: Cardiovascular Disease

## 2023-12-10 ENCOUNTER — Other Ambulatory Visit: Payer: Self-pay | Admitting: Cardiovascular Disease

## 2024-01-20 ENCOUNTER — Emergency Department (HOSPITAL_BASED_OUTPATIENT_CLINIC_OR_DEPARTMENT_OTHER): Payer: Medicare Other

## 2024-01-20 ENCOUNTER — Emergency Department (HOSPITAL_BASED_OUTPATIENT_CLINIC_OR_DEPARTMENT_OTHER)
Admission: EM | Admit: 2024-01-20 | Discharge: 2024-01-20 | Disposition: A | Discharge status: Home / Self Care | Payer: Medicare Other | Attending: Emergency Medicine | Admitting: Emergency Medicine

## 2024-01-20 ENCOUNTER — Encounter (HOSPITAL_BASED_OUTPATIENT_CLINIC_OR_DEPARTMENT_OTHER): Payer: Self-pay | Admitting: Emergency Medicine

## 2024-01-20 ENCOUNTER — Other Ambulatory Visit: Payer: Self-pay

## 2024-01-20 DIAGNOSIS — R079 Chest pain, unspecified: Secondary | ICD-10-CM | POA: Diagnosis present

## 2024-01-20 LAB — BASIC METABOLIC PANEL
Anion gap: 9 (ref 5–15)
BUN: 19 mg/dL (ref 8–23)
CO2: 26 mmol/L (ref 22–32)
Calcium: 9.3 mg/dL (ref 8.9–10.3)
Chloride: 99 mmol/L (ref 98–111)
Creatinine, Ser: 0.92 mg/dL (ref 0.44–1.00)
GFR, Estimated: >60 mL/min (ref >60)
Glucose, Bld: 103 mg/dL — ABNORMAL HIGH (ref 70–99)
Potassium: 4.2 mmol/L (ref 3.5–5.1)
Sodium: 134 mmol/L — ABNORMAL LOW (ref 135–145)

## 2024-01-20 LAB — CBC WITH DIFFERENTIAL/PLATELET
Abs Immature Granulocytes: 0.03 10*3/uL (ref 0.00–0.07)
Basophils Absolute: 0 10*3/uL (ref 0.0–0.1)
Basophils Relative: 1 %
Eosinophils Absolute: 0.3 10*3/uL (ref 0.0–0.5)
Eosinophils Relative: 4 %
HCT: 33.9 % — ABNORMAL LOW (ref 36.0–46.0)
Hemoglobin: 11.1 g/dL — ABNORMAL LOW (ref 12.0–15.0)
Immature Granulocytes: 1 %
Lymphocytes Relative: 14 %
Lymphs Abs: 0.9 10*3/uL (ref 0.7–4.0)
MCH: 29.4 pg (ref 26.0–34.0)
MCHC: 32.7 g/dL (ref 30.0–36.0)
MCV: 89.9 fL (ref 80.0–100.0)
Monocytes Absolute: 0.5 10*3/uL (ref 0.1–1.0)
Monocytes Relative: 8 %
Neutro Abs: 4.8 10*3/uL (ref 1.7–7.7)
Neutrophils Relative %: 72 %
Platelets: 289 10*3/uL (ref 150–400)
RBC: 3.77 MIL/uL — ABNORMAL LOW (ref 3.87–5.11)
RDW: 19.4 % — ABNORMAL HIGH (ref 11.5–15.5)
WBC: 6.7 10*3/uL (ref 4.0–10.5)
nRBC: 0 % (ref 0.0–0.2)

## 2024-01-20 LAB — TROPONIN I (HIGH SENSITIVITY): Troponin I (High Sensitivity): 9 ng/L (ref <18)

## 2024-01-20 MED ORDER — ACETAMINOPHEN 500 MG PO TABS
1000.0000 mg | ORAL_TABLET | Freq: Once | ORAL | Status: AC
  Administered 2024-01-20: 1000 mg via ORAL
  Filled 2024-01-20: qty 2

## 2024-01-20 NOTE — ED Provider Notes (Signed)
[place] [facility] Provider Note   CSN: [id number] Arrival date & time: 01/20/24  1853     History Chief Complaint  Patient presents with   Chest Pain    HPI Katherine Hall is a 83 y.o. female presenting for chief complaint of chest pain.  83 year old female expansive medical history.  Follows very closely with cardiology.  5X bypass in 2017.  Repeat PCI attempt 3 years ago for three-vessel disease that ultimately failed as they could not wire the stenosis. She had worsening infarction during the procedure so it was terminated. Per chart and patient's recollection, she was told that no further intervention would be beneficial and a catheterization would only be tried again for acute immediately life-threatening ischemia. She states that she has very frequent episodes of angina.  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  She took 4 nitroglycerin before resolution of pain. Now she denies fevers chills nausea vomiting syncope shortness of breath or any chest pain at this time.   Patient's recorded medical, surgical, social, medication list and allergies were reviewed in the Snapshot window as part of the initial history.   Review of Systems   Review of Systems  Constitutional:  Negative for chills and fever.  HENT:  Negative for ear pain and sore throat.   Eyes:  Negative for pain and visual disturbance.  Respiratory:  Negative for cough and shortness of breath.   Cardiovascular:  Positive for chest pain. Negative for palpitations.  Gastrointestinal:  Negative for abdominal pain and vomiting.  Genitourinary:  Negative for dysuria and hematuria.  Musculoskeletal:  Negative for arthralgias and back pain.  Skin:  Negative for color change and rash.  Neurological:  Negative for seizures and syncope.  All other systems reviewed and are negative.   Physical Exam Updated Vital Signs BP (!) 148/75   Pulse  81   Temp 97.9 F (36.6 C) (Oral)   Resp 18   Ht 4\' 8"  (1.422 m)   Wt 51.7 kg   SpO2 91%   BMI 25.56 kg/m  Physical Exam Vitals and nursing note reviewed.  Constitutional:      General: She is not in acute distress.    Appearance: She is well-developed.  HENT:     Head: Normocephalic and atraumatic.  Eyes:     Conjunctiva/sclera: Conjunctivae normal.  Cardiovascular:     Rate and Rhythm: Normal rate and regular rhythm.     Heart sounds: No murmur heard. Pulmonary:     Effort: Pulmonary effort is normal. No respiratory distress.     Breath sounds: Normal breath sounds.  Abdominal:     General: There is no distension.     Palpations: Abdomen is soft.     Tenderness: There is no abdominal tenderness. There is no right CVA tenderness or left CVA tenderness.  Musculoskeletal:        General: No swelling or tenderness. Normal range of motion.     Cervical back: Neck supple.  Skin:    General: Skin is warm and dry.  Neurological:     General: No focal deficit present.     Mental Status: She is alert and oriented to person, place, and time. Mental status is at baseline.     Cranial Nerves: No cranial nerve deficit.      ED Course/ Medical Decision Making/ A&P    Procedures Procedures   Medications Ordered in ED Medications  acetaminophen (TYLENOL) tablet 1,000 mg (1,000 mg Oral Given 01/20/24 2118)    Medical Decision Making:   This an 83 year old female presenting with chest pain. Her presentation is most consistent with likely coronary etiology of her chest pain.  She has known stable angina responsive to nitroglycerin with another episode today.  She has known obstructions and follows closely with cardiology in the outpatient setting. Unfortunately her last catheterization and PCI attempt was unsuccessful and she has been told that intervention would only be beneficial in a immediately life-threatening situation. Fortunately her pain was during exertion and has now  resolved more consistent with stable angina rather than unstable angina. It is now resolved fortunately.  EKG does not show any acute pathology, troponins ordered to evaluate for concern for immediate NSTEMI or other continued obstruction. Chest pain happened approximately 6 hours prior to collection of labs.  Reassessment: Lab work and troponin demonstrate no acute pathology. All symptoms completely resolved.  Discussed observation given her very high risk of coronary syndrome.  However she states that since her chest pain resolved, she would rather follow-up with her cardiologist in outpatient setting and accepted understood the risk of recurrent or worsening disease in the setting of her chronic disease.  Disposition:  I have considered need for hospitalization, however, considering all of the above, I believe this patient is stable for discharge at this time.  Patient/family educated about specific return precautions for given chief complaint and symptoms.  Patient/family educated about follow-up with PCP and cardiology.     Patient/family expressed understanding of return precautions and need for follow-up. Patient spoken to regarding all imaging and laboratory results and appropriate follow up for these results. All education provided in verbal form with additional information in written form. Time was allowed for answering of patient questions. Patient discharged.    Emergency Department Medication Summary:   Medications  acetaminophen (TYLENOL) tablet 1,000 mg (1,000 mg Oral Given 01/20/24 2118)        Clinical Impression:  1. Chest pain, unspecified type      Discharge   Final Clinical Impression(s) / ED Diagnoses Final diagnoses:  Chest pain, unspecified type    Rx / DC Orders ED Discharge Orders     None         Glyn Ade, MD 01/20/24 2144

## 2024-01-20 NOTE — ED Triage Notes (Signed)
Pt c/o CP, radiating around LT side to back; took NTG x 4 about 40 mins ago; no improvement in pain after NTG

## 2024-01-22 ENCOUNTER — Telehealth: Payer: Self-pay

## 2024-01-22 ENCOUNTER — Other Ambulatory Visit (HOSPITAL_COMMUNITY): Payer: Self-pay

## 2024-01-22 ENCOUNTER — Telehealth: Payer: Self-pay | Admitting: Pharmacist

## 2024-01-22 DIAGNOSIS — I25118 Atherosclerotic heart disease of native coronary artery with other forms of angina pectoris: Secondary | ICD-10-CM

## 2024-01-22 DIAGNOSIS — I739 Peripheral vascular disease, unspecified: Secondary | ICD-10-CM

## 2024-01-22 DIAGNOSIS — E7849 Other hyperlipidemia: Secondary | ICD-10-CM

## 2024-01-22 MED ORDER — REPATHA SURECLICK 140 MG/ML ~~LOC~~ SOAJ: 1.0000 mL | SUBCUTANEOUS | 1 refills | Status: DC

## 2024-01-22 NOTE — Telephone Encounter (Signed)
Called and spoke with patient. Repatha now preferred on her plan. Patient fine to switch from Praluent to Repatha.  Reports she is on grant program.   Verified:   Card No. [id number] Card Status Active BIN [id number] PCN [id number] [facility] [id number]

## 2024-01-22 NOTE — Telephone Encounter (Signed)
Pharmacy Patient Advocate Encounter   Received notification from CoverMyMeds that prior authorization for PRALUENT is required/requested.   Insurance verification completed.   The patient is insured through [facility] .   Per test claim:  REPATHA is preferred by the insurance.  If suggested medication is appropriate, Please send in a new RX and discontinue this one. If not, please advise as to why it's not appropriate so that we may request a Prior Authorization. Please note, some preferred medications may still require a PA.  If the suggested medications have not been trialed and there are no contraindications to their use, the PA will not be submitted, as it will not be approved. Repatha is paying with a $47 copay (PA on file till 12/04/24

## 2024-01-23 ENCOUNTER — Ambulatory Visit
Admission: EM | Admit: 2024-01-23 | Discharge: 2024-01-23 | Disposition: A | Discharge status: Home / Self Care | Payer: Medicare Other | Attending: Family Medicine | Admitting: Family Medicine

## 2024-01-23 ENCOUNTER — Other Ambulatory Visit: Payer: Self-pay

## 2024-01-23 DIAGNOSIS — J069 Acute upper respiratory infection, unspecified: Secondary | ICD-10-CM | POA: Diagnosis not present

## 2024-01-23 DIAGNOSIS — R059 Cough, unspecified: Secondary | ICD-10-CM

## 2024-01-23 MED ORDER — DOXYCYCLINE HYCLATE 100 MG PO CAPS: 100.0000 mg | ORAL_CAPSULE | Freq: Two times a day (BID) | ORAL | 0 refills | Status: AC

## 2024-01-23 MED ORDER — HYDROCODONE BIT-HOMATROP MBR 5-1.5 MG/5ML PO SOLN
5.0000 mL | Freq: Four times a day (QID) | ORAL | 0 refills | Status: DC | PRN
Start: 2024-01-23 — End: 2024-04-09

## 2024-01-23 MED ORDER — BENZONATATE 200 MG PO CAPS: 200.0000 mg | ORAL_CAPSULE | Freq: Three times a day (TID) | ORAL | 0 refills | Status: AC | PRN

## 2024-01-23 NOTE — Discharge Instructions (Addendum)
Advised patient to take medications as directed with food to completion advised may take Tessalon capsules daily or as needed for cough.  Advised may use Hycodan cough syrup at night for cough prior to sleep due to sedative effects.  Encouraged to increase daily water intake to 64 ounces per day while taking these medications.  Advised if symptoms worsen and/or unresolved please follow-up with your PCP or here for further evaluation.

## 2024-01-23 NOTE — ED Provider Notes (Signed)
[facility]    CSN: [id number] Arrival date & time: 01/23/24  1318      History   Chief Complaint Chief Complaint  Patient presents with   Cough   Headache    HPI Katherine Hall is a 83 y.o. female.   HPI 83 year old female presents with cough and headache for 5 days.  Patient is accompanied by her husband.  Patient has extensive cardiovascular history and is followed by cardiology currently.  PMH significant for CAD (s/p CABG x 5 in 2017), HTN, and PAD.  Patient is currently on Plavix and denies any unusual bleeding.  Patient was evaluated for chest pain at [facility] ED on Saturday, 01/20/2024-please see epic for those encounter notes.  Past Medical History:  Diagnosis Date   AKI (acute kidney injury) (HCC) 07/28/2022   Anemia    Arthritis    Coronary artery disease    s/p CABG in 2017 // Myoview 11/21: No ischemia or infarction, EF > 65, low risk  // [place] >> cath 3/22: S-RPDA w severe disease - PCI unsuccessful & c/b peri-procedure MI (acute closure w wire manipulation>>inf ST elev) >> Med Rx    Echocardiogram 02/2021    EF 65-70, no RWMA, mild LVH, Gr 1 DD, normal RVSF, RVSP 19.5, mild AI, mild AV sclerosis   Familial hypercholesterolemia    a. h/o intolerance of statins, prev on Repatha but insurance stopped covering.   Gastric ulcer    a. h/o bleeding ulcer 2009 requiring 3 pints of blood.   History of removal of cyst    a. 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   HTN (hypertension)    Hypothyroidism    PAD (peripheral artery disease) (HCC)    S/p L SFA PTA // ABIs/LE arterial US 4/22: R 0.7; L 0.86 // R SFA 75-99; L SFA angioplasty site 30-49, distal 50-74 // ectatic distal aorta 2.6 x 2.6 cm   Prediabetes     Patient Active Problem List   Diagnosis Date Noted   Palpitations 02/01/2023   Failure to thrive in adult 07/28/2022   AKI (acute kidney injury) (HCC) 07/28/2022   Urinary retention 11/09/2019   Hematoma  11/08/2019   Acute blood loss anemia 11/08/2019   Statin intolerance 11/08/2019   Tobacco abuse 11/08/2019   Pre-diabetes 11/08/2019   Peripheral arterial disease (HCC) 11/06/2019   Claudication (HCC) 09/21/2017   Coronary artery disease involving native coronary artery of native heart with angina pectoris (HCC) 07/19/2016   S/P CABG x 5 07/07/2016   Anemia, unspecified 07/05/2016   Hyponatremia 07/05/2016   Chest pain 07/05/2016   Pain in the chest    Hypothyroidism 07/04/2016   Hyperlipidemia 07/04/2016   Unstable angina (HCC) 07/03/2016   Hemorrhage of gastrointestinal tract 08/31/2014   Abnormal mammogram 08/31/2014   Actinic keratosis 08/31/2014   Benign neoplasm of colon 08/31/2014   Essential hypertension 07/19/2014   Adenomatous colon polyp 07/19/2014   Allergic rhinitis 07/19/2014   Allergy to latex 07/19/2014   Anserine bursitis 07/19/2014   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   Diffuse cystic mastopathy of unspecified breast 07/19/2014   Elevated blood-pressure reading without diagnosis of hypertension 07/19/2014   Facial rash 07/19/2014   Gastro-esophageal reflux disease without esophagitis 07/19/2014   Iron deficiency anemia 07/19/2014    Past Surgical History:  Procedure Laterality Date   ABDOMINAL AORTOGRAM W/LOWER EXTREMITY Bilateral 11/06/2019   Procedure: ABDOMINAL AORTOGRAM W/LOWER EXTREMITY;  Surgeon: Kirke Corin,  Chelsea Aus, MD;  Location: [facility];  Service: Cardiovascular;  Laterality: Bilateral;   ABDOMINAL AORTOGRAM W/LOWER EXTREMITY N/A 03/31/2021   Procedure: ABDOMINAL AORTOGRAM W/LOWER EXTREMITY;  Surgeon: Iran Ouch, MD;  Location: [facility];  Service: Cardiovascular;  Laterality: N/A;   ABDOMINAL AORTOGRAM W/LOWER EXTREMITY N/A 09/15/2021   Procedure: ABDOMINAL AORTOGRAM W/LOWER EXTREMITY;  Surgeon: Iran Ouch, MD;  Location: [facility];  Service: Cardiovascular;  Laterality: N/A;   ABDOMINAL  HYSTERECTOMY     APPENDECTOMY     CARDIAC CATHETERIZATION N/A 07/05/2016   Procedure: Left Heart Cath and Coronary Angiography;  Surgeon: Kathleene Hazel, MD;  Location: [facility] INVASIVE CV LAB;  Service: Cardiovascular;  Laterality: N/A;   CARDIAC SURGERY     cyst, not on heart.   CORONARY ARTERY BYPASS GRAFT N/A 07/07/2016   Procedure: CORONARY ARTERY BYPASS GRAFTING (CABG) x 5 with endoscopic harvesting of the right greater saphenous vein;  Surgeon: Alleen Borne, MD;  Location: [facility] OR;  Service: Open Heart Surgery;  Laterality: N/A;   CORONARY BALLOON ANGIOPLASTY N/A 02/03/2021   Procedure: CORONARY BALLOON ANGIOPLASTY;  Surgeon: Iran Ouch, MD;  Location: [facility];  Service: Cardiovascular;  Laterality: N/A;  svg to pda   ESOPHAGOGASTRODUODENOSCOPY (EGD) WITH PROPOFOL N/A 05/08/2021   Procedure: ESOPHAGOGASTRODUODENOSCOPY (EGD) WITH PROPOFOL;  Surgeon: Willis Modena, MD;  Location: [facility] ENDOSCOPY;  Service: Endoscopy;  Laterality: N/A;   HEMORRHOID SURGERY     INTRAOPERATIVE TRANSESOPHAGEAL ECHOCARDIOGRAM N/A 07/07/2016   Procedure: INTRAOPERATIVE TRANSESOPHAGEAL ECHOCARDIOGRAM;  Surgeon: Alleen Borne, MD;  Location: [facility];  Service: Open Heart Surgery;  Laterality: N/A;   LEFT HEART CATH AND CORS/GRAFTS ANGIOGRAPHY N/A 02/03/2021   Procedure: LEFT HEART CATH AND CORS/GRAFTS ANGIOGRAPHY;  Surgeon: Iran Ouch, MD;  [facility]: [facility];  Service: Cardiovascular;  Laterality: N/A;   LEFT HEART CATH AND CORS/GRAFTS ANGIOGRAPHY N/A 07/18/2022   Procedure: LEFT HEART CATH AND CORS/GRAFTS ANGIOGRAPHY;  Surgeon: [place], Peter M, MD;  Location: [facility] INVASIVE CV LAB;  Service: Cardiovascular;  Laterality: N/A;   PERIPHERAL VASCULAR ATHERECTOMY Left 09/15/2021   Procedure: PERIPHERAL VASCULAR ATHERECTOMY;  Surgeon: Iran Ouch, MD;  [facility]: [facility];  Service: Cardiovascular;  Laterality: Left;   PERIPHERAL VASCULAR BALLOON ANGIOPLASTY  11/06/2019   Procedure:  PERIPHERAL VASCULAR BALLOON ANGIOPLASTY;  Surgeon: Iran Ouch, MD;  [facility]: [facility];  Service: Cardiovascular;;  cutting and dcb   PERIPHERAL VASCULAR BALLOON ANGIOPLASTY Right 03/31/2021   Procedure: PERIPHERAL VASCULAR BALLOON ANGIOPLASTY;  Surgeon: Iran Ouch, MD;  [facility]: [facility];  Service: Cardiovascular;  Laterality: Right;  SFA   SHOULDER SURGERY      OB History   No obstetric history on file.      Home Medications    Prior to Admission medications   Medication Sig Start Date End Date Taking? Authorizing Provider  benzonatate (TESSALON) 200 MG capsule Take 1 capsule (200 mg total) by mouth 3 (three) times daily as needed for up to 7 days. 01/23/24 01/30/24 Yes Trevor Iha, FNP  doxycycline (VIBRAMYCIN) 100 MG capsule Take 1 capsule (100 mg total) by mouth 2 (two) times daily for 7 days. 01/23/24 01/30/24 Yes Trevor Iha, FNP  HYDROcodone bit-homatropine (HYCODAN) 5-1.5 MG/5ML syrup Take 5 mLs by mouth every 6 (six) hours as needed for cough. 01/23/24  Yes Trevor Iha, FNP  acyclovir (ZOVIRAX) 400 MG tablet Take 400 mg by mouth daily as  needed (for blister outbreaks per patient).    [provider]  amiodarone (PACERONE) 200 MG tablet Take 1 tablet by mouth twice daily for 2 weeks, then decrease to once daily thereafter 03/02/23   Nahser, Deloris Ping, MD  Bempedoic Acid-Ezetimibe (NEXLIZET) 180-10 MG TABS Take 1 tablet by mouth daily. 05/19/23   Iran Ouch, MD  Carboxymethylcellul-Glycerin (LUBRICATING EYE DROPS OP) Place 1 drop into both eyes daily as needed (dry eyes).    [provider]  cilostazol (PLETAL) 100 MG tablet Take 1 tablet (100 mg total) by mouth 2 (two) times daily. 08/08/23   Nahser, Deloris Ping, MD  clopidogrel (PLAVIX) 75 MG tablet Take 1 tablet (75 mg total) by mouth daily. Resume only after 5 days ( on 05/13/21) 05/08/21   Burnadette Pop, MD  diphenhydramine-acetaminophen (TYLENOL PM) 25-500 MG TABS tablet Take  2 tablets by mouth at bedtime.    [provider]  Evolocumab (REPATHA SURECLICK) 140 MG/ML SOAJ Inject 140 mg into the skin every 14 (fourteen) days. 01/22/24   Nahser, Deloris Ping, MD  fluticasone (FLONASE) 50 MCG/ACT nasal spray Place 1 spray into both nostrils daily as needed for allergies. 02/22/18   [provider]  isosorbide mononitrate (IMDUR) 60 MG 24 hr tablet TAKE 1 TABLET BY MOUTH EVERY DAY 12/12/23   Iran Ouch, MD  levothyroxine (SYNTHROID, LEVOTHROID) 50 MCG tablet Take 50 mcg by mouth daily before breakfast. 03/31/16   [provider]  metoprolol tartrate (LOPRESSOR) 25 MG tablet TAKE 1 TABLET BY MOUTH TWICE  DAILY 08/18/23   Nahser, Deloris Ping, MD  Multiple Vitamins-Minerals (EYE VITAMINS & MINERALS PO) Take 1 tablet by mouth 2 (two) times daily.    [provider]  nitroGLYCERIN (NITROSTAT) 0.4 MG SL tablet Dissolve 1 tablet under the tongue every 5 minutes as needed for chest pain. Max of 3 doses, then 911. 10/24/22   Nahser, Deloris Ping, MD  pantoprazole (PROTONIX) 20 MG tablet Take 20 mg by mouth daily.    [provider]  ranolazine (RANEXA) 500 MG 12 hr tablet Take 1 tablet (500 mg total) by mouth 2 (two) times daily. 07/19/22   Alwyn Ren, MD  rOPINIRole (REQUIP) 2 MG tablet Take 2 mg by mouth at bedtime.    [provider]  traMADol (ULTRAM) 50 MG tablet Take 50 mg by mouth daily.    [provider]  valsartan (DIOVAN) 80 MG tablet TAKE 1 TABLET BY MOUTH EVERY DAY 07/13/23   Nahser, Deloris Ping, MD    Family History Family History  Problem Relation Age of Onset   Stroke Mother        Stroke age 23   CAD Father        died of MI age 64   Coronary artery disease Sister        one sister had massive MI at 7, another sister has 13 stents   Coronary artery disease Brother        one brother had MI at age 97 and died at 36, another has had 4 MIs and 2 bypasses    Social History Social History   Tobacco Use    Smoking status: Never   Smokeless tobacco: Never  Vaping Use   Vaping status: Never Used  Substance Use Topics   Alcohol use: No    Comment: social   Drug use: No     Allergies   Penicillins, Lincomycin, Naproxen sodium, Statins, Crestor [rosuvastatin], Latex, Lipitor [atorvastatin],  and Tape   Review of Systems Review of Systems  HENT:  Positive for congestion.   Respiratory:  Positive for cough.   Neurological:  Positive for headaches.  All other systems reviewed and are negative.    Physical Exam Triage Vital Signs ED Triage Vitals  Encounter Vitals Group     BP      Systolic BP Percentile      Diastolic BP Percentile      Pulse      Resp      Temp      Temp src      SpO2      Weight      Height      Head Circumference      Peak Flow      Pain Score      Pain Loc      Pain Education      Exclude from Growth Chart    No data found.  Updated Vital Signs BP (!) 177/86 (BP Location: Right Arm)   Pulse 69   Temp (!) 97.5 F (36.4 C) (Oral)   Resp 18   SpO2 96%      Physical Exam Vitals and nursing note reviewed.  Constitutional:      General: She is not in acute distress.    Appearance: Normal appearance. She is normal weight. She is ill-appearing.  HENT:     Head: Normocephalic and atraumatic.     Right Ear: Tympanic membrane, ear canal and external ear normal.     Left Ear: Tympanic membrane, ear canal and external ear normal.     Mouth/Throat:     Mouth: Mucous membranes are moist.     Pharynx: Oropharynx is clear.  Eyes:     Extraocular Movements: Extraocular movements intact.     Conjunctiva/sclera: Conjunctivae normal.     Pupils: Pupils are equal, round, and reactive to light.  Cardiovascular:     Rate and Rhythm: Normal rate and regular rhythm.     Pulses: Normal pulses.     Heart sounds: Normal heart sounds. No murmur heard.    No friction rub. No gallop.  Pulmonary:     Effort: Pulmonary effort is normal.     Breath sounds:  Normal breath sounds. No wheezing, rhonchi or rales.     Comments: Infrequent nonproductive cough on exam Chest:     Chest wall: No tenderness.  Musculoskeletal:        General: Normal range of motion.     Cervical back: Normal range of motion and neck supple.  Skin:    General: Skin is warm and dry.  Neurological:     General: No focal deficit present.     Mental Status: She is alert and oriented to person, place, and time. Mental status is at baseline.  Psychiatric:        Mood and Affect: Mood normal.        Behavior: Behavior normal.        Thought Content: Thought content normal.      UC Treatments / Results  Labs (all labs ordered are listed, but only abnormal results are displayed) Labs Reviewed - No data to display  EKG   Radiology No results found.  Procedures Procedures (including critical care time)  Medications Ordered in UC Medications - No data to display  Initial Impression / Assessment and Plan / UC Course  I have reviewed the triage vital signs and the nursing notes.  Pertinent  labs & imaging results that were available during my care of the patient were reviewed by me and considered in my medical decision making (see chart for details).     MDM: 1.  Acute URI-doxycycline 100 mg capsule: Take 1 capsule twice daily x 7 days; 2.  Cough, unspecified type-Tessalon 200 mg capsule: Take 1 capsule 3 times daily, as needed for cough, Rx'd Hycodan 5-1.5 mg / 5 mL syrup: Take 5 mL every 6 hours as needed for cough. Advised patient to take medications as directed with food to completion advised may take Tessalon capsules daily or as needed for cough.  Advised may use Hycodan cough syrup at night for cough prior to sleep due to sedative effects.  Encouraged to increase daily water intake to 64 ounces per day while taking these medications.  Advised if symptoms worsen and/or unresolved please follow-up with your PCP or here for further evaluation.  Patient discharged  home, hemodynamically stable. Final Clinical Impressions(s) / UC Diagnoses   Final diagnoses:  Cough, unspecified type  Acute URI     Discharge Instructions      Advised patient to take medications as directed with food to completion advised may take Tessalon capsules daily or as needed for cough.  Advised may use Hycodan cough syrup at night for cough prior to sleep due to sedative effects.  Encouraged to increase daily water intake to 64 ounces per day while taking these medications.  Advised if symptoms worsen and/or unresolved please follow-up with your PCP or here for further evaluation.     ED Prescriptions     Medication Sig Dispense Auth. Provider   doxycycline (VIBRAMYCIN) 100 MG capsule Take 1 capsule (100 mg total) by mouth 2 (two) times daily for 7 days. 14 capsule Trevor Iha, FNP   benzonatate (TESSALON) 200 MG capsule Take 1 capsule (200 mg total) by mouth 3 (three) times daily as needed for up to 7 days. 40 capsule Trevor Iha, FNP   HYDROcodone bit-homatropine (HYCODAN) 5-1.5 MG/5ML syrup Take 5 mLs by mouth every 6 (six) hours as needed for cough. 120 mL Trevor Iha, FNP      I have reviewed the PDMP during this encounter.   Trevor Iha, FNP 01/23/24 1435

## 2024-01-23 NOTE — ED Triage Notes (Signed)
Pt here today with husband c/o cough and HA x 5 days. Mucinex and dayquil prn.

## 2024-02-05 ENCOUNTER — Other Ambulatory Visit: Payer: Medicare Other

## 2024-02-05 DIAGNOSIS — Z5181 Encounter for therapeutic drug level monitoring: Secondary | ICD-10-CM

## 2024-02-06 LAB — TSH: TSH: 2.79 u[IU]/mL (ref 0.450–4.500)

## 2024-02-08 ENCOUNTER — Encounter: Payer: Self-pay | Admitting: Cardiovascular Disease

## 2024-02-20 ENCOUNTER — Encounter: Payer: Self-pay | Admitting: Cardiovascular Disease

## 2024-02-20 ENCOUNTER — Ambulatory Visit: Payer: Medicare Other | Attending: Cardiovascular Disease | Admitting: Cardiovascular Disease

## 2024-02-20 VITALS — BP 132/72 | HR 53 | Ht <= 58 in | Wt 114.0 lb

## 2024-02-20 DIAGNOSIS — I10 Essential (primary) hypertension: Secondary | ICD-10-CM | POA: Diagnosis not present

## 2024-02-20 DIAGNOSIS — E7849 Other hyperlipidemia: Secondary | ICD-10-CM

## 2024-02-20 DIAGNOSIS — I2510 Atherosclerotic heart disease of native coronary artery without angina pectoris: Secondary | ICD-10-CM | POA: Diagnosis not present

## 2024-02-20 DIAGNOSIS — I739 Peripheral vascular disease, unspecified: Secondary | ICD-10-CM

## 2024-02-20 DIAGNOSIS — I493 Ventricular premature depolarization: Secondary | ICD-10-CM

## 2024-02-20 NOTE — Patient Instructions (Addendum)
 Medication Instructions:  STOP the Metoprolol  *If you need a refill on your cardiac medications before your next appointment, please call your pharmacy*   Lab Work: None ordered If you have labs (blood work) drawn today and your tests are completely normal, you will receive your results only by: MyChart Message (if you have MyChart) OR A paper copy in the mail If you have any lab test that is abnormal or we need to change your treatment, we will call you to review the results.   Testing/Procedures: Your physician has requested that you have a lower extremity arterial duplex in May. During this test, ultrasound is used to evaluate arterial blood flow in the legs. Allow one hour for this exam. There are no restrictions or special instructions.  Your physician has requested that you have an ankle brachial index (ABI) in May. During this test an ultrasound and blood pressure cuff are used to evaluate the arteries that supply the arms and legs with blood. Allow thirty minutes for this exam. There are no restrictions or special instructions.   Please note: We ask at that you not bring children with you during ultrasound (echo/ vascular) testing. Due to room size and safety concerns, children are not allowed in the ultrasound rooms during exams. Our front office staff cannot provide observation of children in our lobby area while testing is being conducted. An adult accompanying a patient to their appointment will only be allowed in the ultrasound room at the discretion of the ultrasound technician under special circumstances. We apologize for any inconvenience.   Follow-Up: At [facility], you and your health needs are our priority.  As part of our continuing mission to provide you with exceptional heart care, we have created designated Provider Care Teams.  These Care Teams include your primary Cardiologist (physician) and Advanced Practice Providers (APPs -  Physician Assistants and  Nurse Practitioners) who all work together to provide you with the care you need, when you need it.  We recommend signing up for the patient portal called "MyChart".  Sign up information is provided on this After Visit Summary.  MyChart is used to connect with patients for Virtual Visits (Telemedicine).  Patients are able to view lab/test results, encounter notes, upcoming appointments, etc.  Non-urgent messages can be sent to your provider as well.   To learn more about what you can do with MyChart, go to [email or web address].    Your next appointment:   6 month(s)  Provider:   Dr. Kirke Corin    1st Floor: - Lobby - Registration  - Pharmacy  - Lab - Cafe  2nd Floor: - PV Lab - Diagnostic Testing (echo, CT, nuclear med)  3rd Floor: - Vacant  4th Floor: - TCTS (cardiothoracic surgery) - AFib Clinic - Structural Heart Clinic - Vascular Surgery  - Vascular Ultrasound  5th Floor: - [facility] Cardiology (general and EP) - Clinical Pharmacy for coumadin, hypertension, lipid, weight-loss medications, and med management appointments    Valet parking services will be available as well.

## 2024-02-20 NOTE — Progress Notes (Signed)
 Cardiology Office Note   Date:  02/20/2024   ID:  Katherine Hall, DOB 04-02-1941, MRN [id number]  PCP:  Katherine Dolly, DO  Cardiologist: Dr. Elease Hashimoto  No chief complaint on file.      History of Present Illness: Katherine Hall is a 83 y.o. female who is here today for follow-up regarding peripheral arterial disease.   She has known history of coronary artery disease status post CABG in 2017, essential hypertension, anemia and hyperlipidemia with intolerance to statins.  She is not diabetic and is a lifelong non-smoker. She was hospitalized in March, 2022 with unstable angina.  Cardiac catheterization showed severe underlying three-vessel coronary artery disease with patent grafts including LIMA to LAD, SVG to OM1/OM 3, SVG to diagonal with 60% anastomosis stenosis and SVG to right PDA.  There was 90% stenosis in the proximal portion of the SVG to right PDA followed by another 90% stenosis leading into an aneurysmal segment which was followed by a 95% stenosis.  I attempted angioplasty of SVG to right PDA but I was not able to cross the stenosis with multiple wires beyond the aneurysmal segment.  Subsequently, there was loss of flow and acute closure with wire manipulation.  The patient was treated medically and did reasonably well.    She is known to have peripheral arterial disease with claudication.  She is status post bilateral SFA endovascular intervention with drug-coated balloon angioplasty.  She was hospitalized in June of 2022 with GI bleed requiring transfusion.   Plavix was held and then was resumed without aspirin.  No bleeding since then. She was hospitalized shortly after that with hypokalemia and hyponatremia.  Hydrochlorothiazide was discontinued.  She is on amiodarone for symptomatic PVCs.    Most recent lower extremity arterial Doppler testing in May 2024 showed a drop in ABI to 0.4 on the right side with evidence of new right popliteal artery occlusion into the TP  trunk.    She continues to be noted by severe claudication that is worse on the right side.  She is also having issues with anemia.  She reports bradycardia and fatigue and thinks it is related to metoprolol.  Past Medical History:  Diagnosis Date   AKI (acute kidney injury) (HCC) 07/28/2022   Anemia    Arthritis    Coronary artery disease    s/p CABG in 2017 // Myoview 11/21: No ischemia or infarction, EF > 65, low risk  // [place] >> cath 3/22: S-RPDA w severe disease - PCI unsuccessful & c/b peri-procedure MI (acute closure w wire manipulation>>inf ST elev) >> Med Rx    Echocardiogram 02/2021    EF 65-70, no RWMA, mild LVH, Gr 1 DD, normal RVSF, RVSP 19.5, mild AI, mild AV sclerosis   Familial hypercholesterolemia    a. h/o intolerance of statins, prev on Repatha but insurance stopped covering.   Gastric ulcer    a. h/o bleeding ulcer 2009 requiring 3 pints of blood.   History of removal of cyst    a. 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   HTN (hypertension)    Hypothyroidism    PAD (peripheral artery disease) (HCC)    S/p L SFA PTA // ABIs/LE arterial US 4/22: R 0.7; L 0.86 // R SFA 75-99; L SFA angioplasty site 30-49, distal 50-74 // ectatic distal aorta 2.6 x 2.6 cm   Prediabetes     Past Surgical History:  Procedure Laterality Date   ABDOMINAL AORTOGRAM  W/LOWER EXTREMITY Bilateral 11/06/2019   Procedure: ABDOMINAL AORTOGRAM W/LOWER EXTREMITY;  Surgeon: Iran Ouch, MD;  Location: [facility];  Service: Cardiovascular;  Laterality: Bilateral;   ABDOMINAL AORTOGRAM W/LOWER EXTREMITY N/A 03/31/2021   Procedure: ABDOMINAL AORTOGRAM W/LOWER EXTREMITY;  Surgeon: Iran Ouch, MD;  Location: [facility];  Service: Cardiovascular;  Laterality: N/A;   ABDOMINAL AORTOGRAM W/LOWER EXTREMITY N/A 09/15/2021   Procedure: ABDOMINAL AORTOGRAM W/LOWER EXTREMITY;  Surgeon: Iran Ouch, MD;  Location: [facility];  Service:  Cardiovascular;  Laterality: N/A;   ABDOMINAL HYSTERECTOMY     APPENDECTOMY     CARDIAC CATHETERIZATION N/A 07/05/2016   Procedure: Left Heart Cath and Coronary Angiography;  Surgeon: Kathleene Hazel, MD;  Location: [facility] INVASIVE CV [facility];  Service: Cardiovascular;  Laterality: N/A;   CARDIAC SURGERY     cyst, not on heart.   CORONARY ARTERY BYPASS GRAFT N/A 07/07/2016   Procedure: CORONARY ARTERY BYPASS GRAFTING (CABG) x 5 with endoscopic harvesting of the right greater saphenous vein;  Surgeon: Alleen Borne, MD;  Location: [facility];  Service: Open Heart Surgery;  Laterality: N/A;   CORONARY BALLOON ANGIOPLASTY N/A 02/03/2021   Procedure: CORONARY BALLOON ANGIOPLASTY;  Surgeon: Iran Ouch, MD;  Location: [facility];  Service: Cardiovascular;  Laterality: N/A;  svg to pda   ESOPHAGOGASTRODUODENOSCOPY (EGD) WITH PROPOFOL N/A 05/08/2021   Procedure: ESOPHAGOGASTRODUODENOSCOPY (EGD) WITH PROPOFOL;  Surgeon: Willis Modena, MD;  Location: [facility];  Service: Endoscopy;  Laterality: N/A;   HEMORRHOID SURGERY     INTRAOPERATIVE TRANSESOPHAGEAL ECHOCARDIOGRAM N/A 07/07/2016   Procedure: INTRAOPERATIVE TRANSESOPHAGEAL ECHOCARDIOGRAM;  Surgeon: Alleen Borne, MD;  Location: [facility];  Service: Open Heart Surgery;  Laterality: N/A;   LEFT HEART CATH AND CORS/GRAFTS ANGIOGRAPHY N/A 02/03/2021   Procedure: LEFT HEART CATH AND CORS/GRAFTS ANGIOGRAPHY;  Surgeon: Iran Ouch, MD;  Location: [facility];  Service: Cardiovascular;  Laterality: N/A;   LEFT HEART CATH AND CORS/GRAFTS ANGIOGRAPHY N/A 07/18/2022   Procedure: LEFT HEART CATH AND CORS/GRAFTS ANGIOGRAPHY;  Surgeon: [place], Peter M, MD;  [facility] CV [facility];  Service: Cardiovascular;  Laterality: N/A;   PERIPHERAL VASCULAR ATHERECTOMY Left 09/15/2021   Procedure: PERIPHERAL VASCULAR ATHERECTOMY;  Surgeon: Iran Ouch, MD;  Location: [facility];  Service: Cardiovascular;  Laterality: Left;   PERIPHERAL VASCULAR  BALLOON ANGIOPLASTY  11/06/2019   Procedure: PERIPHERAL VASCULAR BALLOON ANGIOPLASTY;  Surgeon: Iran Ouch, MD;  Location: [facility];  Service: Cardiovascular;;  cutting and dcb   PERIPHERAL VASCULAR BALLOON ANGIOPLASTY Right 03/31/2021   Procedure: PERIPHERAL VASCULAR BALLOON ANGIOPLASTY;  Surgeon: Iran Ouch, MD;  Location: [facility];  Service: Cardiovascular;  Laterality: Right;  SFA   SHOULDER SURGERY       Current Outpatient Medications  Medication Sig Dispense Refill   acyclovir (ZOVIRAX) 400 MG tablet Take 400 mg by mouth daily as needed (for blister outbreaks per patient).     amiodarone (PACERONE) 200 MG tablet Take 1 tablet by mouth twice daily for 2 weeks, then decrease to once daily thereafter 102 tablet 3   Bempedoic Acid-Ezetimibe (NEXLIZET) 180-10 MG TABS Take 1 tablet by mouth daily. 90 tablet 3   Carboxymethylcellul-Glycerin (LUBRICATING EYE DROPS OP) Place 1 drop into both eyes daily as needed (dry eyes).     cilostazol (PLETAL) 100 MG tablet Take 1 tablet (100 mg total) by mouth 2 (two) times daily. 180 tablet 3  clopidogrel (PLAVIX) 75 MG tablet Take 1 tablet by mouth daily.     diphenhydramine-acetaminophen (TYLENOL PM) 25-500 MG TABS tablet Take 2 tablets by mouth at bedtime.     Evolocumab (REPATHA SURECLICK) 140 MG/ML SOAJ Inject 140 mg into the skin every 14 (fourteen) days. 6 mL 1   fluticasone (FLONASE) 50 MCG/ACT nasal spray Place 1 spray into both nostrils daily as needed for allergies.     isosorbide mononitrate (IMDUR) 60 MG 24 hr tablet TAKE 1 TABLET BY MOUTH EVERY DAY 90 tablet 3   levothyroxine (SYNTHROID, LEVOTHROID) 50 MCG tablet Take 50 mcg by mouth daily before breakfast.     Multiple Vitamins-Minerals (EYE VITAMINS & MINERALS PO) Take 1 tablet by mouth 2 (two) times daily.     pantoprazole (PROTONIX) 20 MG tablet Take 20 mg by mouth daily.     ranolazine (RANEXA) 500 MG 12 hr tablet Take 1 tablet (500 mg total) by mouth 2  (two) times daily. 60 tablet 4   rOPINIRole (REQUIP) 2 MG tablet Take 2 mg by mouth at bedtime.     traMADol (ULTRAM) 50 MG tablet Take 50 mg by mouth 2 (two) times daily.     valsartan (DIOVAN) 80 MG tablet TAKE 1 TABLET BY MOUTH EVERY DAY 90 tablet 3   clopidogrel (PLAVIX) 75 MG tablet Take 1 tablet (75 mg total) by mouth daily. Resume only after 5 days ( on 05/13/21) (Patient not taking: Reported on 02/20/2024) 90 tablet 3   HYDROcodone bit-homatropine (HYCODAN) 5-1.5 MG/5ML syrup Take 5 mLs by mouth every 6 (six) hours as needed for cough. 120 mL 0   nitroGLYCERIN (NITROSTAT) 0.4 MG SL tablet Dissolve 1 tablet under the tongue every 5 minutes as needed for chest pain. Max of 3 doses, then 911. (Patient not taking: Reported on 02/20/2024) 25 tablet 6   No current facility-administered medications for this visit.    Allergies:   Penicillins, Lincomycin, Naproxen sodium, Statins, Crestor [rosuvastatin], Latex, Lipitor [atorvastatin], and Tape    Social History:  The patient  reports that she has never smoked. She has never used smokeless tobacco. She reports that she does not drink alcohol and does not use drugs.   Family History:  The patient's family history includes CAD in her father; Coronary artery disease in her brother and sister; Stroke in her mother.    ROS:  Please see the history of present illness.   Otherwise, review of systems are positive for none.   All other systems are reviewed and negative.    PHYSICAL EXAM: VS:  BP 132/72 (BP Location: Left Arm, Patient Position: Sitting)   Pulse (!) 53   Ht 4\' 7"  (1.397 m)   Wt 114 lb (51.7 kg)   SpO2 95%   BMI 26.50 kg/m  , BMI Body mass index is 26.5 kg/m. GEN: Well nourished, well developed, in no acute distress  HEENT: normal  Neck: no JVD, carotid bruits, or masses Cardiac: RRR; no murmurs, rubs, or gallops,no edema  Respiratory:  clear to auscultation bilaterally, normal work of breathing GI: soft, nontender, nondistended,  + BS MS: no deformity or atrophy  Skin: warm and dry, no rash Neuro:  Strength and sensation are intact Psych: euthymic mood, full affect   EKG:  EKG is not ordered today. EKG showed :   Recent Labs: 01/20/2024: BUN 19; Creatinine, Ser 0.92; Hemoglobin 11.1; Platelets 289; Potassium 4.2; Sodium 134 02/05/2024: TSH 2.790    Lipid Panel    Component Value  Date/Time   CHOL 130 02/01/2023 1446   TRIG 125 02/01/2023 1446   HDL 64 02/01/2023 1446   CHOLHDL 2.0 02/01/2023 1446   CHOLHDL 4.9 07/18/2022 0553   VLDL 29 07/18/2022 0553   LDLCALC 44 02/01/2023 1446      Wt Readings from Last 3 Encounters:  02/20/24 114 lb (51.7 kg)  01/20/24 114 lb (51.7 kg)  08/29/23 115 lb (52.2 kg)           No data to display            ASSESSMENT AND PLAN:  1.  Peripheral arterial disease: Status post bilateral SFA intervention most recently left SFA orbital atherectomy and drug-coated balloon angioplasty.   She has known bilateral leg claudication mostly on the right side due to an occluded right popliteal artery.  Continue cilostazol.  I discussed with her the option of a structured exercise therapy but she declined. I requested repeat ABI and duplex to be done in May.  2.  Coronary artery disease involving native coronary arteries with stable angina: Her symptoms are reasonably controlled overall on Imdur and Ranexa.  She complains of fatigue and she thinks it is due to metoprolol especially in the setting of bradycardia.  I elected to discontinue metoprolol today and monitor symptoms.    3. Familial hyperlipidemia with severely elevated LDL: Intolerance to statins.  He is doing well with Repatha with recent lipid profile showing an LDL of 44.  4.  Essential hypertension: Blood pressure is controlled.  Metoprolol was stopped due to bradycardia.  5.  Symptomatic PVCs: Controlled with amiodarone.   Disposition:   FU with me in 6 months.  Signed,  Lorine Bears, MD  02/20/2024  12:57 PM    [facility]

## 2024-04-04 ENCOUNTER — Other Ambulatory Visit: Payer: Self-pay | Admitting: Cardiovascular Disease

## 2024-04-06 ENCOUNTER — Encounter (HOSPITAL_BASED_OUTPATIENT_CLINIC_OR_DEPARTMENT_OTHER): Payer: Self-pay | Admitting: Emergency Medicine

## 2024-04-06 ENCOUNTER — Other Ambulatory Visit: Payer: Self-pay

## 2024-04-06 ENCOUNTER — Inpatient Hospital Stay (HOSPITAL_BASED_OUTPATIENT_CLINIC_OR_DEPARTMENT_OTHER)
Admission: EM | Admit: 2024-04-06 | Discharge: 2024-04-09 | DRG: 872 | Disposition: A | Discharge status: Home / Self Care | Attending: Internal Medicine | Admitting: Internal Medicine

## 2024-04-06 ENCOUNTER — Emergency Department (HOSPITAL_BASED_OUTPATIENT_CLINIC_OR_DEPARTMENT_OTHER)

## 2024-04-06 DIAGNOSIS — Z951 Presence of aortocoronary bypass graft: Secondary | ICD-10-CM

## 2024-04-06 DIAGNOSIS — Z79899 Other long term (current) drug therapy: Secondary | ICD-10-CM | POA: Diagnosis not present

## 2024-04-06 DIAGNOSIS — Z79890 Hormone replacement therapy (postmenopausal): Secondary | ICD-10-CM | POA: Diagnosis not present

## 2024-04-06 DIAGNOSIS — H9190 Unspecified hearing loss, unspecified ear: Secondary | ICD-10-CM | POA: Diagnosis present

## 2024-04-06 DIAGNOSIS — R6520 Severe sepsis without septic shock: Secondary | ICD-10-CM | POA: Diagnosis present

## 2024-04-06 DIAGNOSIS — K828 Other specified diseases of gallbladder: Secondary | ICD-10-CM | POA: Diagnosis present

## 2024-04-06 DIAGNOSIS — E039 Hypothyroidism, unspecified: Secondary | ICD-10-CM | POA: Diagnosis present

## 2024-04-06 DIAGNOSIS — I493 Ventricular premature depolarization: Secondary | ICD-10-CM | POA: Diagnosis present

## 2024-04-06 DIAGNOSIS — N10 Acute tubulo-interstitial nephritis: Secondary | ICD-10-CM | POA: Diagnosis present

## 2024-04-06 DIAGNOSIS — N179 Acute kidney failure, unspecified: Secondary | ICD-10-CM | POA: Diagnosis present

## 2024-04-06 DIAGNOSIS — Z9049 Acquired absence of other specified parts of digestive tract: Secondary | ICD-10-CM

## 2024-04-06 DIAGNOSIS — I714 Abdominal aortic aneurysm, without rupture, unspecified: Secondary | ICD-10-CM | POA: Diagnosis present

## 2024-04-06 DIAGNOSIS — I10 Essential (primary) hypertension: Secondary | ICD-10-CM | POA: Diagnosis present

## 2024-04-06 DIAGNOSIS — A419 Sepsis, unspecified organism: Secondary | ICD-10-CM | POA: Diagnosis present

## 2024-04-06 DIAGNOSIS — Z8711 Personal history of peptic ulcer disease: Secondary | ICD-10-CM

## 2024-04-06 DIAGNOSIS — E876 Hypokalemia: Secondary | ICD-10-CM | POA: Diagnosis present

## 2024-04-06 DIAGNOSIS — E785 Hyperlipidemia, unspecified: Secondary | ICD-10-CM | POA: Diagnosis present

## 2024-04-06 DIAGNOSIS — K219 Gastro-esophageal reflux disease without esophagitis: Secondary | ICD-10-CM | POA: Diagnosis present

## 2024-04-06 DIAGNOSIS — Z888 Allergy status to other drugs, medicaments and biological substances status: Secondary | ICD-10-CM

## 2024-04-06 DIAGNOSIS — Z8249 Family history of ischemic heart disease and other diseases of the circulatory system: Secondary | ICD-10-CM | POA: Diagnosis not present

## 2024-04-06 DIAGNOSIS — A4151 Sepsis due to Escherichia coli [E. coli]: Secondary | ICD-10-CM | POA: Diagnosis present

## 2024-04-06 DIAGNOSIS — I25119 Atherosclerotic heart disease of native coronary artery with unspecified angina pectoris: Secondary | ICD-10-CM | POA: Diagnosis present

## 2024-04-06 DIAGNOSIS — Z90710 Acquired absence of both cervix and uterus: Secondary | ICD-10-CM

## 2024-04-06 DIAGNOSIS — I739 Peripheral vascular disease, unspecified: Secondary | ICD-10-CM | POA: Diagnosis present

## 2024-04-06 DIAGNOSIS — Z881 Allergy status to other antibiotic agents status: Secondary | ICD-10-CM

## 2024-04-06 DIAGNOSIS — Z7902 Long term (current) use of antithrombotics/antiplatelets: Secondary | ICD-10-CM

## 2024-04-06 DIAGNOSIS — Z91040 Latex allergy status: Secondary | ICD-10-CM

## 2024-04-06 DIAGNOSIS — I70201 Unspecified atherosclerosis of native arteries of extremities, right leg: Secondary | ICD-10-CM | POA: Diagnosis present

## 2024-04-06 DIAGNOSIS — E872 Acidosis, unspecified: Secondary | ICD-10-CM | POA: Diagnosis present

## 2024-04-06 DIAGNOSIS — Z886 Allergy status to analgesic agent status: Secondary | ICD-10-CM

## 2024-04-06 DIAGNOSIS — Z91048 Other nonmedicinal substance allergy status: Secondary | ICD-10-CM

## 2024-04-06 DIAGNOSIS — R310 Gross hematuria: Secondary | ICD-10-CM | POA: Diagnosis present

## 2024-04-06 DIAGNOSIS — Z880 Allergy status to penicillin: Secondary | ICD-10-CM

## 2024-04-06 LAB — HEPATIC FUNCTION PANEL
ALT: 32 U/L (ref 0–44)
AST: 53 U/L — ABNORMAL HIGH (ref 15–41)
Albumin: 3.6 g/dL (ref 3.5–5.0)
Alkaline Phosphatase: 206 U/L — ABNORMAL HIGH (ref 38–126)
Bilirubin, Direct: 0.4 mg/dL — ABNORMAL HIGH (ref 0.0–0.2)
Indirect Bilirubin: 0.2 mg/dL — ABNORMAL LOW (ref 0.3–0.9)
Total Bilirubin: 0.6 mg/dL (ref 0.0–1.2)
Total Protein: 6.5 g/dL (ref 6.5–8.1)

## 2024-04-06 LAB — URINALYSIS, ROUTINE W REFLEX MICROSCOPIC
Glucose, UA: NEGATIVE mg/dL
Ketones, ur: NEGATIVE mg/dL
Nitrite: NEGATIVE
Protein, ur: >=300 mg/dL — AB
Specific Gravity, Urine: 1.025 (ref 1.005–1.030)
pH: 5 (ref 5.0–8.0)

## 2024-04-06 LAB — LACTIC ACID, PLASMA
Lactic Acid, Venous: 2.5 mmol/L (ref 0.5–1.9)
Lactic Acid, Venous: 2.4 mmol/L (ref 0.5–1.9)
Lactic Acid, Venous: 1.5 mmol/L (ref 0.5–1.9)
Lactic Acid, Venous: 1.1 mmol/L (ref 0.5–1.9)

## 2024-04-06 LAB — BASIC METABOLIC PANEL WITH GFR
Anion gap: 21 — ABNORMAL HIGH (ref 5–15)
Anion gap: 13 (ref 5–15)
BUN: 24 mg/dL — ABNORMAL HIGH (ref 8–23)
BUN: 19 mg/dL (ref 8–23)
CO2: 19 mmol/L — ABNORMAL LOW (ref 22–32)
CO2: 18 mmol/L — ABNORMAL LOW (ref 22–32)
Calcium: 9.4 mg/dL (ref 8.9–10.3)
Calcium: 8.2 mg/dL — ABNORMAL LOW (ref 8.9–10.3)
Chloride: 93 mmol/L — ABNORMAL LOW (ref 98–111)
Chloride: 104 mmol/L (ref 98–111)
Creatinine, Ser: 1.76 mg/dL — ABNORMAL HIGH (ref 0.44–1.00)
Creatinine, Ser: 1.14 mg/dL — ABNORMAL HIGH (ref 0.44–1.00)
GFR, Estimated: 28 mL/min — ABNORMAL LOW (ref >60)
GFR, Estimated: 48 mL/min — ABNORMAL LOW (ref >60)
Glucose, Bld: 125 mg/dL — ABNORMAL HIGH (ref 70–99)
Glucose, Bld: 106 mg/dL — ABNORMAL HIGH (ref 70–99)
Potassium: 3.2 mmol/L — ABNORMAL LOW (ref 3.5–5.1)
Potassium: 3.1 mmol/L — ABNORMAL LOW (ref 3.5–5.1)
Sodium: 133 mmol/L — ABNORMAL LOW (ref 135–145)
Sodium: 135 mmol/L (ref 135–145)

## 2024-04-06 LAB — URINALYSIS, MICROSCOPIC (REFLEX): WBC, UA: >50 WBC/hpf (ref 0–5)

## 2024-04-06 LAB — CBC
HCT: 36.2 % (ref 36.0–46.0)
Hemoglobin: 12.2 g/dL (ref 12.0–15.0)
MCH: 31.8 pg (ref 26.0–34.0)
MCHC: 33.7 g/dL (ref 30.0–36.0)
MCV: 94.3 fL (ref 80.0–100.0)
Platelets: 294 10*3/uL (ref 150–400)
RBC: 3.84 MIL/uL — ABNORMAL LOW (ref 3.87–5.11)
RDW: 15.5 % (ref 11.5–15.5)
WBC: 18.9 10*3/uL — ABNORMAL HIGH (ref 4.0–10.5)
nRBC: 0 % (ref 0.0–0.2)

## 2024-04-06 LAB — MAGNESIUM: Magnesium: 1.8 mg/dL (ref 1.7–2.4)

## 2024-04-06 MED ORDER — TRAMADOL HCL 50 MG PO TABS: 50.0000 mg | ORAL_TABLET | Freq: Two times a day (BID) | ORAL | Status: DC

## 2024-04-06 MED ORDER — ACETAMINOPHEN 325 MG PO TABS
650.0000 mg | ORAL_TABLET | Freq: Four times a day (QID) | ORAL | Status: DC | PRN
  Administered 2024-04-09 (×2): 650 mg via ORAL
  Filled 2024-04-06 (×2): qty 2

## 2024-04-06 MED ORDER — SODIUM CHLORIDE 0.9 % IV BOLUS
500.0000 mL | Freq: Once | INTRAVENOUS | Status: AC
  Administered 2024-04-06: 500 mL via INTRAVENOUS

## 2024-04-06 MED ORDER — HYDROCODONE-ACETAMINOPHEN 5-325 MG PO TABS: 1.0000 | ORAL_TABLET | ORAL | Status: DC | PRN

## 2024-04-06 MED ORDER — SODIUM CHLORIDE 0.9 % IV SOLN: INTRAVENOUS | Status: DC

## 2024-04-06 MED ORDER — MORPHINE SULFATE (PF) 2 MG/ML IV SOLN
2.0000 mg | INTRAVENOUS | Status: DC | PRN
  Administered 2024-04-06 – 2024-04-08 (×5): 2 mg via INTRAVENOUS
  Filled 2024-04-06 (×6): qty 1

## 2024-04-06 MED ORDER — ONDANSETRON HCL 4 MG PO TABS: 4.0000 mg | ORAL_TABLET | Freq: Four times a day (QID) | ORAL | Status: DC | PRN

## 2024-04-06 MED ORDER — MORPHINE SULFATE (PF) 4 MG/ML IV SOLN
4.0000 mg | Freq: Once | INTRAVENOUS | Status: AC
  Administered 2024-04-06: 4 mg via INTRAVENOUS
  Filled 2024-04-06: qty 1

## 2024-04-06 MED ORDER — FENTANYL CITRATE PF 50 MCG/ML IJ SOSY
50.0000 ug | PREFILLED_SYRINGE | Freq: Once | INTRAMUSCULAR | Status: AC
  Administered 2024-04-06: 50 ug via INTRAVENOUS
  Filled 2024-04-06: qty 1

## 2024-04-06 MED ORDER — ACETAMINOPHEN 650 MG RE SUPP: 650.0000 mg | Freq: Four times a day (QID) | RECTAL | Status: DC | PRN

## 2024-04-06 MED ORDER — ACETAMINOPHEN 325 MG PO TABS
650.0000 mg | ORAL_TABLET | Freq: Once | ORAL | Status: AC
  Administered 2024-04-06: 650 mg via ORAL
  Filled 2024-04-06: qty 2

## 2024-04-06 MED ORDER — ENOXAPARIN SODIUM 30 MG/0.3ML IJ SOSY
30.0000 mg | PREFILLED_SYRINGE | INTRAMUSCULAR | Status: DC
  Administered 2024-04-06 – 2024-04-08 (×3): 30 mg via SUBCUTANEOUS
  Filled 2024-04-06 (×3): qty 0.3

## 2024-04-06 MED ORDER — METOPROLOL TARTRATE 5 MG/5ML IV SOLN
5.0000 mg | Freq: Four times a day (QID) | INTRAVENOUS | Status: DC | PRN
  Administered 2024-04-08: 5 mg via INTRAVENOUS
  Filled 2024-04-06: qty 5

## 2024-04-06 MED ORDER — SODIUM CHLORIDE 0.9 % IV SOLN
2.0000 g | Freq: Once | INTRAVENOUS | Status: AC
  Administered 2024-04-06: 2 g via INTRAVENOUS
  Filled 2024-04-06: qty 20

## 2024-04-06 MED ORDER — SODIUM CHLORIDE 0.9 % IV BOLUS (SEPSIS)
500.0000 mL | Freq: Once | INTRAVENOUS | Status: AC
  Administered 2024-04-06: 500 mL via INTRAVENOUS

## 2024-04-06 MED ORDER — POTASSIUM CHLORIDE CRYS ER 20 MEQ PO TBCR
40.0000 meq | EXTENDED_RELEASE_TABLET | Freq: Once | ORAL | Status: AC
  Administered 2024-04-06: 40 meq via ORAL
  Filled 2024-04-06: qty 2

## 2024-04-06 MED ORDER — SODIUM CHLORIDE 0.9 % IV SOLN
2.0000 g | INTRAVENOUS | Status: DC
  Administered 2024-04-07 – 2024-04-09 (×3): 2 g via INTRAVENOUS
  Filled 2024-04-06 (×3): qty 20

## 2024-04-06 MED ORDER — ONDANSETRON HCL 4 MG/2ML IJ SOLN
4.0000 mg | Freq: Four times a day (QID) | INTRAMUSCULAR | Status: DC | PRN
  Administered 2024-04-08: 4 mg via INTRAVENOUS
  Filled 2024-04-06: qty 2

## 2024-04-06 MED ORDER — TRAMADOL HCL 50 MG PO TABS
50.0000 mg | ORAL_TABLET | Freq: Two times a day (BID) | ORAL | Status: DC
  Administered 2024-04-06 – 2024-04-08 (×5): 50 mg via ORAL
  Filled 2024-04-06 (×6): qty 1

## 2024-04-06 NOTE — Assessment & Plan Note (Addendum)
 Creatinine baseline .7-.9 Likely AKI due to sepsis/hypotension Received 1L IVF, additional 500cc bolus and continue IVF Keep MAP >65 Avoid nephrotoxic drugs Strict I/O UA 0-5 RBC Trend

## 2024-04-06 NOTE — Assessment & Plan Note (Signed)
 83 year old presenting to ED with bilateral flank pain, chills and dysuria/gross hematuria found to have severe sepsis with hypotension, leukocytosis, tachycardia, lactic acidosis with AKI and findings on CT that shows nonspecific bilateral perinephric stranding with fluid extending inferiorly in the anterior pararenal space.  -admit to progressive -received 1L IVF in ED, repeat lactic acid elevated, bolus additional 500cc for 30mg /kg and continue maintenance IVF -continue rocephin -blood culture and urine cultures pending -mentions distended gallbladder, but no TTP in RUQ  -anti pyretics -pain control  -blood pressure improved after IVF boluses  -trend lactic acid

## 2024-04-06 NOTE — Assessment & Plan Note (Addendum)
 New finding on CT, measuring 4cm (since 2010), but followed by cardiology  Repeat CT or MR in 12 months and f/u with vascular

## 2024-04-06 NOTE — Sepsis Progress Note (Signed)
 Following for sepsis monitoring ?

## 2024-04-06 NOTE — ED Triage Notes (Signed)
 Patient coming to ED for evaluation of "possible kidney infection."  Reports symptoms started on Tuesday.  Has been having flank and back pain.  Blood noted in urine on Tuesday.  Unknown if she had any fever, but family member states "she was been sweating all over."  Called PCP and was instructed to take Tylenol .  Mild relief of symptoms with medication

## 2024-04-06 NOTE — Assessment & Plan Note (Signed)
 Continue nexlizet  and repatha   Statin intolerant

## 2024-04-06 NOTE — Assessment & Plan Note (Signed)
 TSH wnl in march of 2025 Continue home synthroid  dose of 50mcg daily

## 2024-04-06 NOTE — ED Provider Notes (Signed)
 [facility] Provider Note   CSN: [id number] Arrival date & time: 04/06/24  0028     History  Chief Complaint  Patient presents with   Flank Pain    Katherine Hall is a 83 y.o. female.  The history is provided by the patient.  Flank Pain  Katherine Hall is a 83 y.o. female who presents to the Emergency Department complaining of back pain.  She presents to the emergency department for evaluation of bilateral low back pain that started on Wednesday.  Has associated body aches, feeling cold and cannot eat.  She did have 2 episodes of emesis.  She has not checked her temperature.  She does report associated dysuria but this is overall resolving.  She does have a history of coronary artery disease, high blood pressure.  No prior similar symptoms.      Home Medications Prior to Admission medications   Medication Sig Start Date End Date Taking? Authorizing Provider  acyclovir  (ZOVIRAX ) 400 MG tablet Take 400 mg by mouth daily as needed (for blister outbreaks per patient).    [provider]  amiodarone  (PACERONE ) 200 MG tablet Take 1 tablet by mouth twice daily for 2 weeks, then decrease to once daily thereafter 03/02/23   Nahser, Lela Purple, MD  Bempedoic Acid-Ezetimibe  (NEXLIZET ) 180-10 MG TABS Take 1 tablet by mouth daily. 05/19/23   Wenona Hamilton, MD  Carboxymethylcellul-Glycerin (LUBRICATING EYE DROPS OP) Place 1 drop into both eyes daily as needed (dry eyes).    [provider]  cilostazol  (PLETAL ) 100 MG tablet Take 1 tablet (100 mg total) by mouth 2 (two) times daily. 08/08/23   Nahser, Lela Purple, MD  clopidogrel  (PLAVIX ) 75 MG tablet Take 1 tablet (75 mg total) by mouth daily. Resume only after 5 days ( on 05/13/21) Patient not taking: Reported on 02/20/2024 05/08/21   Leona Rake, MD  clopidogrel  (PLAVIX ) 75 MG tablet Take 1 tablet by mouth daily. 09/15/23   [provider]  diphenhydramine -acetaminophen  (TYLENOL   PM) 25-500 MG TABS tablet Take 2 tablets by mouth at bedtime.    [provider]  Evolocumab  (REPATHA  SURECLICK) 140 MG/ML SOAJ Inject 140 mg into the skin every 14 (fourteen) days. 01/22/24   Nahser, Lela Purple, MD  fluticasone  (FLONASE ) 50 MCG/ACT nasal spray Place 1 spray into both nostrils daily as needed for allergies. 02/22/18   [provider]  HYDROcodone  bit-homatropine (HYCODAN) 5-1.5 MG/5ML syrup Take 5 mLs by mouth every 6 (six) hours as needed for cough. 01/23/24   Leonides Ramp, FNP  isosorbide  mononitrate (IMDUR ) 60 MG 24 hr tablet TAKE 1 TABLET BY MOUTH EVERY DAY 12/12/23   Wenona Hamilton, MD  levothyroxine  (SYNTHROID , LEVOTHROID) 50 MCG tablet Take 50 mcg by mouth daily before breakfast. 03/31/16   [provider]  Multiple Vitamins-Minerals (EYE VITAMINS & MINERALS PO) Take 1 tablet by mouth 2 (two) times daily.    [provider]  nitroGLYCERIN  (NITROSTAT ) 0.4 MG SL tablet Dissolve 1 tablet under the tongue every 5 minutes as needed for chest pain. Max of 3 doses, then 911. Patient not taking: Reported on 02/20/2024 10/24/22   Nahser, Lela Purple, MD  pantoprazole  (PROTONIX ) 20 MG tablet Take 20 mg by mouth daily.    [provider]  ranolazine  (RANEXA ) 500 MG 12 hr tablet Take 1 tablet (500 mg total) by mouth 2 (two) times daily. 07/19/22   Barbee Lew, MD  rOPINIRole  (REQUIP ) 2 MG tablet Take  2 mg by mouth at bedtime.    [provider]  traMADol  (ULTRAM ) 50 MG tablet Take 50 mg by mouth 2 (two) times daily.    [provider]  valsartan  (DIOVAN ) 80 MG tablet TAKE 1 TABLET BY MOUTH EVERY DAY 07/13/23   Nahser, Lela Purple, MD      Allergies    Penicillins, Lincomycin, Naproxen sodium, Statins, Crestor  [rosuvastatin ], Latex, Lipitor [atorvastatin ], and Tape    Review of Systems   Review of Systems  Genitourinary:  Positive for flank pain.  All other systems reviewed and are negative.   Physical Exam Updated Vital  Signs BP (!) 100/58   Pulse 85   Temp 98.1 F (36.7 C) (Oral)   Resp 17   Ht 4\' 7"  (1.397 m)   Wt 49.9 kg   SpO2 100%   BMI 25.57 kg/m  Physical Exam Vitals and nursing note reviewed.  Constitutional:      Appearance: She is well-developed.  HENT:     Head: Normocephalic and atraumatic.  Cardiovascular:     Rate and Rhythm: Normal rate and regular rhythm.     Heart sounds: No murmur heard. Pulmonary:     Effort: Pulmonary effort is normal. No respiratory distress.     Breath sounds: Normal breath sounds.  Abdominal:     Palpations: Abdomen is soft.     Tenderness: There is no abdominal tenderness. There is no guarding or rebound.  Musculoskeletal:        General: No tenderness.  Skin:    General: Skin is warm and dry.     Capillary Refill: Capillary refill takes less than 2 seconds.  Neurological:     Mental Status: She is alert and oriented to person, place, and time.     Comments: 5/5 strength in all four extremities.  Hard of hearing  Psychiatric:        Behavior: Behavior normal.     ED Results / Procedures / Treatments   Labs (all labs ordered are listed, but only abnormal results are displayed) Labs Reviewed  URINALYSIS, ROUTINE W REFLEX MICROSCOPIC - Abnormal; Notable for the following components:      Result Value   APPearance TURBID (*)    Hgb urine dipstick SMALL (*)    Bilirubin Urine MODERATE (*)    Protein, ur >=300 (*)    Leukocytes,Ua SMALL (*)    All other components within normal limits  BASIC METABOLIC PANEL WITH GFR - Abnormal; Notable for the following components:   Sodium 133 (*)    Potassium 3.2 (*)    Chloride 93 (*)    CO2 19 (*)    Glucose, Bld 125 (*)    BUN 24 (*)    Creatinine, Ser 1.76 (*)    GFR, Estimated 28 (*)    Anion gap 21 (*)    All other components within normal limits  CBC - Abnormal; Notable for the following components:   WBC 18.9 (*)    RBC 3.84 (*)    All other components within normal limits  URINALYSIS,  MICROSCOPIC (REFLEX) - Abnormal; Notable for the following components:   Bacteria, UA MANY (*)    All other components within normal limits  HEPATIC FUNCTION PANEL - Abnormal; Notable for the following components:   AST 53 (*)    Alkaline Phosphatase 206 (*)    Bilirubin, Direct 0.4 (*)    Indirect Bilirubin 0.2 (*)    All other components within normal limits  LACTIC  ACID, PLASMA - Abnormal; Notable for the following components:   Lactic Acid, Venous 2.5 (*)    All other components within normal limits  LACTIC ACID, PLASMA - Abnormal; Notable for the following components:   Lactic Acid, Venous 2.4 (*)    All other components within normal limits  CULTURE, BLOOD (SINGLE)  CULTURE, BLOOD (ROUTINE X 2)    EKG None  Radiology CT ABDOMEN PELVIS WO CONTRAST Result Date: 04/06/2024 CLINICAL DATA:  Abdominal/flank pain, stone suspected EXAM: CT ABDOMEN AND PELVIS WITHOUT CONTRAST TECHNIQUE: Multidetector CT imaging of the abdomen and pelvis was performed following the standard protocol without IV contrast. RADIATION DOSE REDUCTION: This exam was performed according to the departmental dose-optimization program which includes automated exposure control, adjustment of the mA and/or kV according to patient size and/or use of iterative reconstruction technique. COMPARISON:  CT 05/27/2009. There is a report from CT 11/07/2019 however no images are available. FINDINGS: Lower chest: Respiratory motion obscures detail. Small left pleural effusion and associated airspace opacities. Hepatobiliary: Stable hepatic cyst. Distended gallbladder. No biliary dilation or radiopaque stone. Pancreas: Unremarkable. Spleen: Unremarkable. Adrenals/Urinary Tract: Normal adrenal glands. No urinary calculi or hydronephrosis. Unremarkable bladder. Stomach/Bowel: Normal caliber large and small bowel. No bowel wall thickening. The appendix is not visualized. Small hiatal hernia. Vascular/Lymphatic: Aortic atherosclerotic  calcification. 4.0 x 3.5 cm infrarenal abdominal aortic aneurysm is new since 2010. Reproductive: Unremarkable. Other: Nonspecific bilateral perinephric stranding with fluid extending inferiorly in the anterior pararenal space. No free intraperitoneal fluid or air. No abscess. Musculoskeletal: No acute fracture. Grade 1-2 anterolisthesis of L4. IMPRESSION: *Nonspecific bilateral perinephric stranding with fluid extending inferiorly in the anterior pararenal space. This could be due to pyelonephritis. Correlate with urinalysis. No urinary calculi or hydronephrosis. *Distended gallbladder. If there is right upper quadrant pain consider ultrasound for further evaluation. *Small left pleural effusion and associated airspace opacities. Atelectasis versus pneumonia. *4.0 cm infrarenal abdominal aortic aneurysm is new since 2010. Recommend follow-up CT or MR as appropriate in 12 months and referral to or continued care with vascular specialist. (Ref.: J Vasc Surg. 2018; 67:2-77 and J Am Coll Radiol 2013;10(10):789-794.) *Aortic Atherosclerosis (ICD10-I70.0). Electronically Signed   By: Rozell Cornet M.D.   On: 04/06/2024 02:11    Procedures Procedures   CRITICAL CARE Performed by: Kelsey Patricia   Total critical care time: 35 minutes  Critical care time was exclusive of separately billable procedures and treating other patients.  Critical care was necessary to treat or prevent imminent or life-threatening deterioration.  Critical care was time spent personally by me on the following activities: development of treatment plan with patient and/or surrogate as well as nursing, discussions with consultants, evaluation of patient's response to treatment, examination of patient, obtaining history from patient or surrogate, ordering and performing treatments and interventions, ordering and review of laboratory studies, ordering and review of radiographic studies, pulse oximetry and re-evaluation of patient's  condition.  Medications Ordered in ED Medications  0.9 %  sodium chloride  infusion ( Intravenous Canceled Entry 04/06/24 0321)  sodium chloride  0.9 % bolus 500 mL (0 mLs Intravenous Stopped 04/06/24 0300)  cefTRIAXone (ROCEPHIN) 2 g in sodium chloride  0.9 % 100 mL IVPB (0 g Intravenous Stopped 04/06/24 0235)  fentaNYL  (SUBLIMAZE ) injection 50 mcg (50 mcg Intravenous Given 04/06/24 0257)  sodium chloride  0.9 % bolus 500 mL (0 mLs Intravenous Stopped 04/06/24 0415)  morphine  (PF) 4 MG/ML injection 4 mg (4 mg Intravenous Given 04/06/24 0350)  acetaminophen  (TYLENOL ) tablet 650 mg (650 mg Oral  Given 04/06/24 0440)    ED Course/ Medical Decision Making/ A&P                                 Medical Decision Making Amount and/or Complexity of Data Reviewed Labs: ordered. Radiology: ordered.  Risk OTC drugs. Prescription drug management. Decision regarding hospitalization.   Patient with history of coronary artery disease here for evaluation of bilateral back pain.  Patient hypotensive at time of ED arrival.  Sepsis protocol initiated.  Lactic acid was mildly elevated, she was treated with IV fluids, antibiotics for presumed UTI.  CBC with leukocytosis.  UA is consistent with UTI.  CT scan was obtained, which is consistent with pyelonephritis.  She did not receive 30 cc/kg of fluid resuscitation given her quick response to IV fluids, normalization of lactic acid as well as cardiac history.  She was treated with medications for her pain.  Discussed with patient findings of studies and recommendation for admission and she is in agreement with treatment plan.  Hospitalist consulted for admission.        Final Clinical Impression(s) / ED Diagnoses Final diagnoses:  Sepsis without acute organ dysfunction, due to unspecified organism [facility])    Rx / DC Orders ED Discharge Orders     None         Kelsey Patricia, MD 04/06/24 [phone]

## 2024-04-06 NOTE — H&P (Addendum)
 / History and Physical    Patient: Katherine Hall ZOX:[id number] DOB: 1940-12-14 DOA: 04/06/2024 DOS: the patient was seen and examined on 04/06/2024 PCP: Jacquelyne Matte, DO  Patient coming from:  [facility]  - lives with her husband. Ambulates with walker when needed.    Chief Complaint: back/flank pain   HPI: Catalena Remlinger is a 83 y.o. female with medical history significant of PAD, HTN, hypothyroidism, hx of gastric ulcer/bleed, CAD s/p CABG in 2017 who presented to ED with complaints of back pain and concerns for sepsis. She has some intermittent confusion so history is difficult. States she started to feel bad around Wednesday with dysuria and blood in her urine. Her back started to hurt on Wednesday and got progressively worse. She had 2 episodes of vomiting, but can't recall when this happened. She has had poor PO intake, but trying to drink. She states she has had chills and subjective fevers. Denies any cough, diarrhea. Her back/flanks continue to hurt.    Denies any vision changes/headaches, chest pain or palpitations, shortness of breath or cough, + abdominal pain, no diarrhea or leg swelling.    She does not smoke or drink alcohol.   ER Course:  vitals: afebrile, bp: 91/54, HR: 88, RR: 16, oxygen: 99%RA Pertinent labs: WBC: 18.9, potassium: 3.2, BUN: 24, creatinine: 1.76, AG 21, sodium: 133, AST: 53, lactic acid: 2.5>2.4,  CT abdomen/pelvis: *Nonspecific bilateral perinephric stranding with fluid extending inferiorly in the anterior pararenal space. This could be due to pyelonephritis. Correlate with urinalysis. No urinary calculi or hydronephrosis.*Distended gallbladder. If there is right upper quadrant pain consider ultrasound for further evaluation. *Small left pleural effusion and associated airspace opacities. Atelectasis versus pneumonia. *4.0 cm infrarenal abdominal aortic aneurysm is new since 2010. Recommend follow-up CT or MR as appropriate in 12 months and referral to or  continued care with vascular specialist. (Ref.: J Vasc Surg. 2018; 67:2-77 and J Am Coll Radiol 2013;10(10):789-794.) *Aortic Atherosclerosis (ICD10-I70.0). In ED: received 1L IVF, rocephin, morphine  and tylenol . BC/urine culture obtained. [facility] asked to admit     Review of Systems: As mentioned in the history of present illness. All other systems reviewed and are negative. Past Medical History:  Diagnosis Date   AKI (acute kidney injury) (HCC) 07/28/2022   Anemia    Arthritis    Coronary artery disease    s/p CABG in 2017 // Myoview  11/21: No ischemia or infarction, EF > 65, low risk  // [place]  >> cath 3/22: S-RPDA w severe disease - PCI unsuccessful & c/b peri-procedure MI (acute closure w wire manipulation>>inf ST elev) >> Med Rx    Echocardiogram 02/2021    EF 65-70, no RWMA, mild LVH, Gr 1 DD, normal RVSF, RVSP 19.5, mild AI, mild AV sclerosis   Familial hypercholesterolemia    a. h/o intolerance of statins, prev on Repatha  but insurance stopped covering.   Gastric ulcer    a. h/o bleeding ulcer 2009 requiring 3 pints of blood.   History of removal of cyst    a. 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   HTN (hypertension)    Hypothyroidism    PAD (peripheral artery disease) (HCC)    S/p L SFA PTA // ABIs/LE arterial US  4/22: R 0.7; L 0.86 // R SFA 75-99; L SFA angioplasty site 30-49, distal 50-74 // ectatic distal aorta 2.6 x 2.6 cm   Prediabetes    Past Surgical History:  Procedure Laterality Date   ABDOMINAL AORTOGRAM W/LOWER  EXTREMITY Bilateral 11/06/2019   Procedure: ABDOMINAL AORTOGRAM W/LOWER EXTREMITY;  Surgeon: Wenona Hamilton, MD;  Location: [facility];  Service: Cardiovascular;  Laterality: Bilateral;   ABDOMINAL AORTOGRAM W/LOWER EXTREMITY N/A 03/31/2021   Procedure: ABDOMINAL AORTOGRAM W/LOWER EXTREMITY;  Surgeon: Wenona Hamilton, MD;  Location: [facility];  Service: Cardiovascular;  Laterality: N/A;   ABDOMINAL AORTOGRAM  W/LOWER EXTREMITY N/A 09/15/2021   Procedure: ABDOMINAL AORTOGRAM W/LOWER EXTREMITY;  Surgeon: Wenona Hamilton, MD;  Location: [facility];  Service: Cardiovascular;  Laterality: N/A;   ABDOMINAL HYSTERECTOMY     APPENDECTOMY     CARDIAC CATHETERIZATION N/A 07/05/2016   Procedure: Left Heart Cath and Coronary Angiography;  Surgeon: Odie Benne, MD;  Location: [facility] INVASIVE CV LAB;  Service: Cardiovascular;  Laterality: N/A;   CARDIAC SURGERY     cyst, not on heart.   CORONARY ARTERY BYPASS GRAFT N/A 07/07/2016   Procedure: CORONARY ARTERY BYPASS GRAFTING (CABG) x 5 with endoscopic harvesting of the right greater saphenous vein;  Surgeon: Bartley Lightning, MD;  Location: [facility] OR;  Service: Open Heart Surgery;  Laterality: N/A;   CORONARY BALLOON ANGIOPLASTY N/A 02/03/2021   Procedure: CORONARY BALLOON ANGIOPLASTY;  Surgeon: Wenona Hamilton, MD;  Location: [facility];  Service: Cardiovascular;  Laterality: N/A;  svg to pda   ESOPHAGOGASTRODUODENOSCOPY (EGD) WITH PROPOFOL  N/A 05/08/2021   Procedure: ESOPHAGOGASTRODUODENOSCOPY (EGD) WITH PROPOFOL ;  Surgeon: Evangeline Hilts, MD;  Location: [facility];  Service: Endoscopy;  Laterality: N/A;   HEMORRHOID SURGERY     INTRAOPERATIVE TRANSESOPHAGEAL ECHOCARDIOGRAM N/A 07/07/2016   Procedure: INTRAOPERATIVE TRANSESOPHAGEAL ECHOCARDIOGRAM;  Surgeon: Bartley Lightning, MD;  Location: [facility] OR;  Service: Open Heart Surgery;  Laterality: N/A;   LEFT HEART CATH AND CORS/GRAFTS ANGIOGRAPHY N/A 02/03/2021   Procedure: LEFT HEART CATH AND CORS/GRAFTS ANGIOGRAPHY;  Surgeon: Wenona Hamilton, MD;  Location: [facility];  Service: Cardiovascular;  Laterality: N/A;   LEFT HEART CATH AND CORS/GRAFTS ANGIOGRAPHY N/A 07/18/2022   Procedure: LEFT HEART CATH AND CORS/GRAFTS ANGIOGRAPHY;  Surgeon: [place], Peter M, MD;  Location: [facility] INVASIVE CV LAB;  Service: Cardiovascular;  Laterality: N/A;   PERIPHERAL VASCULAR ATHERECTOMY Left 09/15/2021   Procedure:  PERIPHERAL VASCULAR ATHERECTOMY;  Surgeon: Wenona Hamilton, MD;  [facility]: [facility];  Service: Cardiovascular;  Laterality: Left;   PERIPHERAL VASCULAR BALLOON ANGIOPLASTY  11/06/2019   Procedure: PERIPHERAL VASCULAR BALLOON ANGIOPLASTY;  Surgeon: Wenona Hamilton, MD;  Location: [facility];  Service: Cardiovascular;;  cutting and dcb   PERIPHERAL VASCULAR BALLOON ANGIOPLASTY Right 03/31/2021   Procedure: PERIPHERAL VASCULAR BALLOON ANGIOPLASTY;  Surgeon: Wenona Hamilton, MD;  Location: [facility];  Service: Cardiovascular;  Laterality: Right;  SFA   SHOULDER SURGERY     Social History:  reports that she has never smoked. She has never used smokeless tobacco. She reports that she does not drink alcohol and does not use drugs.  Allergies  Allergen Reactions   Penicillins Anaphylaxis    Did it involve swelling of the face/tongue/throat, SOB, or low BP? Yes Did it involve sudden or severe rash/hives, skin peeling, or any reaction on the inside of your mouth or nose? No Did you need to seek medical attention at a hospital or doctor's office? Yes When did it last happen?      10+ years If all above answers are "NO", may proceed with cephalosporin use.     Lincomycin Swelling and Other (See  Comments)    passed out   Naproxen Sodium Other (See Comments)    Stomach ulcers   Statins Other (See Comments)    Myalgias   Crestor  [Rosuvastatin ]     myalgias   Latex Other (See Comments)    Blisters when in contact with skin   Lipitor [Atorvastatin ]     myalgias   Tape     blistering    Family History  Problem Relation Age of Onset   Stroke Mother        Stroke age 14   CAD Father        died of MI age 54   Coronary artery disease Sister        one sister had massive MI at 49, another sister has 13 stents   Coronary artery disease Brother        one brother had MI at age 61 and died at 58, another has had 4 MIs and 2 bypasses    Prior to Admission  medications   Medication Sig Start Date End Date Taking? Authorizing Provider  metoprolol  tartrate (LOPRESSOR ) 25 MG tablet Take 25 mg by mouth 2 (two) times daily. 02/24/24  Yes [provider]  acyclovir  (ZOVIRAX ) 400 MG tablet Take 400 mg by mouth daily as needed (for blister outbreaks per patient).    [provider]  amiodarone  (PACERONE ) 200 MG tablet Take 1 tablet by mouth twice daily for 2 weeks, then decrease to once daily thereafter 03/02/23   Nahser, Lela Purple, MD  Bempedoic Acid-Ezetimibe  (NEXLIZET ) 180-10 MG TABS Take 1 tablet by mouth daily. 05/19/23   Wenona Hamilton, MD  Carboxymethylcellul-Glycerin (LUBRICATING EYE DROPS OP) Place 1 drop into both eyes daily as needed (dry eyes).    [provider]  cilostazol  (PLETAL ) 100 MG tablet Take 1 tablet (100 mg total) by mouth 2 (two) times daily. 08/08/23   Nahser, Lela Purple, MD  clopidogrel  (PLAVIX ) 75 MG tablet Take 1 tablet (75 mg total) by mouth daily. Resume only after 5 days ( on 05/13/21) Patient not taking: Reported on 02/20/2024 05/08/21   Leona Rake, MD  clopidogrel  (PLAVIX ) 75 MG tablet Take 1 tablet by mouth daily. 09/15/23   [provider]  diphenhydramine -acetaminophen  (TYLENOL  PM) 25-500 MG TABS tablet Take 2 tablets by mouth at bedtime.    [provider]  Evolocumab  (REPATHA  SURECLICK) 140 MG/ML SOAJ Inject 140 mg into the skin every 14 (fourteen) days. 01/22/24   Nahser, Lela Purple, MD  fluticasone  (FLONASE ) 50 MCG/ACT nasal spray Place 1 spray into both nostrils daily as needed for allergies. 02/22/18   [provider]  HYDROcodone  bit-homatropine (HYCODAN) 5-1.5 MG/5ML syrup Take 5 mLs by mouth every 6 (six) hours as needed for cough. 01/23/24   Leonides Ramp, FNP  isosorbide  mononitrate (IMDUR ) 60 MG 24 hr tablet TAKE 1 TABLET BY MOUTH EVERY DAY 12/12/23   Wenona Hamilton, MD  levothyroxine  (SYNTHROID , LEVOTHROID) 50 MCG tablet Take 50 mcg by mouth daily before breakfast.  03/31/16   [provider]  Multiple Vitamins-Minerals (EYE VITAMINS & MINERALS PO) Take 1 tablet by mouth 2 (two) times daily.    [provider]  nitroGLYCERIN  (NITROSTAT ) 0.4 MG SL tablet Dissolve 1 tablet under the tongue every 5 minutes as needed for chest pain. Max of 3 doses, then 911. Patient not taking: Reported on 02/20/2024 10/24/22   Nahser, Lela Purple, MD  pantoprazole  (PROTONIX ) 20 MG tablet Take 20 mg by mouth daily.  [provider]  ranolazine  (RANEXA ) 500 MG 12 hr tablet Take 1 tablet (500 mg total) by mouth 2 (two) times daily. 07/19/22   Barbee Lew, MD  rOPINIRole  (REQUIP ) 2 MG tablet Take 2 mg by mouth at bedtime.    [provider]  traMADol  (ULTRAM ) 50 MG tablet Take 50 mg by mouth 2 (two) times daily.    [provider]  valsartan  (DIOVAN ) 80 MG tablet TAKE 1 TABLET BY MOUTH EVERY DAY 07/13/23   Nahser, Lela Purple, MD    Physical Exam: Vitals:   04/06/24 1330 04/06/24 1332 04/06/24 1530 04/06/24 1815  BP: 125/71  (!) 151/75 110/77  Pulse: 92  100 93  Resp: (!) 24  16 20   Temp:  99.9 F (37.7 C) 100 F (37.8 C) 98.1 F (36.7 C)  TempSrc:  Oral Oral Oral  SpO2: 100%  97% 99%  Weight:      Height:       General:  Appears calm and comfortable and is in NAD Eyes:  PERRL, EOMI, normal lids, iris ENT:  HOH, lips & tongue, mmm; appropriate dentition Neck:  no LAD, masses or thyromegaly; no carotid bruits Cardiovascular:  RRR, no m/r/g. No LE edema.  Respiratory:   CTA bilaterally with no wheezes/rales/rhonchi.  Normal respiratory effort. Abdomen:  soft, NT, ND, NABS Back:   normal alignment, + CVAT bilaterally  Skin:  no rash or induration seen on limited exam Musculoskeletal:  grossly normal tone BUE/BLE, good ROM, no bony abnormality Lower extremity:  No LE edema.  Limited foot exam with no ulcerations.  2+ distal pulses. Psychiatric:  grossly normal mood and affect, speech fluent and appropriate, AOx3 Neurologic:   CN 2-12 grossly intact, moves all extremities in coordinated fashion, sensation intact   Radiological Exams on Admission: Independently reviewed - see discussion in A/P where applicable  CT ABDOMEN PELVIS WO CONTRAST Result Date: 04/06/2024 CLINICAL DATA:  Abdominal/flank pain, stone suspected EXAM: CT ABDOMEN AND PELVIS WITHOUT CONTRAST TECHNIQUE: Multidetector CT imaging of the abdomen and pelvis was performed following the standard protocol without IV contrast. RADIATION DOSE REDUCTION: This exam was performed according to the departmental dose-optimization program which includes automated exposure control, adjustment of the mA and/or kV according to patient size and/or use of iterative reconstruction technique. COMPARISON:  CT 05/27/2009. There is a report from CT 11/07/2019 however no images are available. FINDINGS: Lower chest: Respiratory motion obscures detail. Small left pleural effusion and associated airspace opacities. Hepatobiliary: Stable hepatic cyst. Distended gallbladder. No biliary dilation or radiopaque stone. Pancreas: Unremarkable. Spleen: Unremarkable. Adrenals/Urinary Tract: Normal adrenal glands. No urinary calculi or hydronephrosis. Unremarkable bladder. Stomach/Bowel: Normal caliber large and small bowel. No bowel wall thickening. The appendix is not visualized. Small hiatal hernia. Vascular/Lymphatic: Aortic atherosclerotic calcification. 4.0 x 3.5 cm infrarenal abdominal aortic aneurysm is new since 2010. Reproductive: Unremarkable. Other: Nonspecific bilateral perinephric stranding with fluid extending inferiorly in the anterior pararenal space. No free intraperitoneal fluid or air. No abscess. Musculoskeletal: No acute fracture. Grade 1-2 anterolisthesis of L4. IMPRESSION: *Nonspecific bilateral perinephric stranding with fluid extending inferiorly in the anterior pararenal space. This could be due to pyelonephritis. Correlate with urinalysis. No urinary calculi or  hydronephrosis. *Distended gallbladder. If there is right upper quadrant pain consider ultrasound for further evaluation. *Small left pleural effusion and associated airspace opacities. Atelectasis versus pneumonia. *4.0 cm infrarenal abdominal aortic aneurysm is new since 2010. Recommend follow-up CT or MR as appropriate in 12 months and referral to or continued  care with vascular specialist. (Ref.: J Vasc Surg. 2018; 67:2-77 and J Am Coll Radiol 2013;10(10):789-794.) *Aortic Atherosclerosis (ICD10-I70.0). Electronically Signed   By: Rozell Cornet M.D.   On: 04/06/2024 02:11    EKG: pending [facility] EKG with nsr    Labs on Admission: I have personally reviewed the available labs and imaging studies at the time of the admission.  Pertinent labs:   WBC: 18.9,  potassium: 3.2,  BUN: 24,  creatinine: 1.76>1.14 AG 21>13 sodium: 133>135 AST: 53,  lactic acid: 2.5>2.4  Assessment and Plan: Principal Problem:   Severe sepsis from pyelonephritis Active Problems:   AKI (acute kidney injury) (HCC)   Hypokalemia   Essential hypertension   Coronary artery disease involving native coronary artery of native heart with angina pectoris (HCC)   Abdominal aortic aneurysm (AAA) 3.0 cm to 5.0 cm in diameter in female (HCC)   Frequent PVCs   Peripheral arterial disease (HCC)   Hypothyroidism   Hyperlipidemia   Gastro-esophageal reflux disease without esophagitis   Acute pyelonephritis    Assessment and Plan: * Severe sepsis from pyelonephritis 83 year old presenting to ED with bilateral flank pain, chills and dysuria/gross hematuria found to have severe sepsis with hypotension, leukocytosis, tachycardia, lactic acidosis with AKI and findings on CT that shows nonspecific bilateral perinephric stranding with fluid extending inferiorly in the anterior pararenal space.  -admit to progressive -received 1L IVF in ED, repeat lactic acid elevated, bolus additional 500cc for 30mg /kg and continue  maintenance IVF -continue rocephin -blood culture and urine cultures pending -mentions distended gallbladder, but no TTP in RUQ  -anti pyretics -pain control  -blood pressure improved after IVF boluses  -trend lactic acid    AKI (acute kidney injury) (HCC) Creatinine baseline .7-.9 Likely AKI due to sepsis/hypotension Received 1L IVF, additional 500cc bolus and continue IVF Keep MAP >65 Avoid nephrotoxic drugs Strict I/O UA 0-5 RBC Trend    Hypokalemia Potassium of 3.2 Check magnesium  Repeat potassium Replete and trend if needed   Essential hypertension Initially hypotensive in setting of sepsis, but now hypertensive  Hold her valsartan  in setting of AKI Hold lopressor  and imdur  with soft bp  PRN metoprolol  IV if needed   Coronary artery disease involving native coronary artery of native heart with angina pectoris (HCC) Hx of CABG x5 in 2017 [facility] in 2023: All her grafts were patent except SVG to right PDA. Native right coronary artery is diffusely diseased and very tortuous. Not suitable for PCI. Recommended to maximize medical therapy  Continue medical therapy    Abdominal aortic aneurysm (AAA) 3.0 cm to 5.0 cm in diameter in female [facility]) New finding on CT, measuring 4cm (since 2010), but followed by cardiology  Repeat CT or MR in 12 months and f/u with vascular   Frequent PVCs Continue amiodarone    Peripheral arterial disease (HCC) She is status post bilateral SFA endovascular intervention with drug-coated balloon angioplasty.  severe right calf claudication due to new occlusion of the right popliteal artery into the TP trunk.  No evidence of critical limb ischemia.  Follows with cardiology.  cilostazol  twice daily  No good endovascular options.  Reserve revascularization for worsening ischemia.  Hypothyroidism TSH wnl in march of 2025 Continue home synthroid  dose of 50mcg daily   Hyperlipidemia Continue nexlizet  and repatha   Statin intolerant    Gastro-esophageal reflux disease without esophagitis Continue PPI     Advance Care Planning:   Code Status: Full Code full   Consults: none   DVT Prophylaxis:  lovenox    Family Communication: updated her husband by phone.   Severity of Illness: The appropriate patient status for this patient is INPATIENT. Inpatient status is judged to be reasonable and necessary in order to provide the required intensity of service to ensure the patient's safety. The patient's presenting symptoms, physical exam findings, and initial radiographic and laboratory data in the context of their chronic comorbidities is felt to place them at high risk for further clinical deterioration. Furthermore, it is not anticipated that the patient will be medically stable for discharge from the hospital within 2 midnights of admission.   * I certify that at the point of admission it is my clinical judgment that the patient will require inpatient hospital care spanning beyond 2 midnights from the point of admission due to high intensity of service, high risk for further deterioration and high frequency of surveillance required.*  Author: Raymona Caldwell, MD 04/06/2024 7:02 PM  For on call review [email or web address].

## 2024-04-06 NOTE — Plan of Care (Signed)
  Problem: Clinical Measurements: Goal: Will remain free from infection Outcome: Progressing   Problem: Clinical Measurements: Goal: Diagnostic test results will improve Outcome: Progressing    Problem: Elimination: Goal: Will not experience complications related to bowel motility Outcome: Progressing Goal: Will not experience complications related to urinary retention Outcome: Progressing   Problem: Safety: Goal: Ability to remain free from injury will improve Outcome: Progressing   Problem: Skin Integrity: Goal: Risk for impaired skin integrity will decrease Outcome: Progressing   Problem: Education: Goal: Knowledge of General Education information will improve Description: Including pain rating scale, medication(s)/side effects and non-pharmacologic comfort measures Outcome: Progressing

## 2024-04-06 NOTE — ED Notes (Signed)
 Charted in error.

## 2024-04-06 NOTE — ED Notes (Signed)
 Assisted up to [facility], tol well. Voided about of amber urine. . C/o of to lower back. EDP informed

## 2024-04-06 NOTE — Assessment & Plan Note (Signed)
 Potassium of 3.2 Check magnesium  Repeat potassium Replete and trend if needed

## 2024-04-06 NOTE — Assessment & Plan Note (Addendum)
 She is status post bilateral SFA endovascular intervention with drug-coated balloon angioplasty.  severe right calf claudication due to new occlusion of the right popliteal artery into the TP trunk.  No evidence of critical limb ischemia.  Follows with cardiology.  cilostazol  twice daily  No good endovascular options.  Reserve revascularization for worsening ischemia.

## 2024-04-06 NOTE — Assessment & Plan Note (Addendum)
 Initially hypotensive in setting of sepsis, but now hypertensive  Hold her valsartan  in setting of AKI Hold lopressor  and imdur  with soft bp  PRN metoprolol  IV if needed

## 2024-04-06 NOTE — Assessment & Plan Note (Addendum)
 Hx of CABG x5 in 2017 [facility] in 2023: All her grafts were patent except SVG to right PDA. Native right coronary artery is diffusely diseased and very tortuous. Not suitable for PCI. Recommended to maximize medical therapy  Continue medical therapy

## 2024-04-06 NOTE — Progress Notes (Addendum)
 Patient arrived in the unit, she is alert and oriented*3, V/S obtained, CCMD notified, CHG bath given, orientation provided about the unit, all needs met, call bell in reach.  MD notified.  04/06/24 1530  Vitals  Temp 100 F (37.8 C)  Temp Source Oral  BP (!) 151/75  MAP (mmHg) 96  BP Location Left Arm  BP Method Automatic  Patient Position (if appropriate) Lying  Pulse Rate 100  Pulse Rate Source Monitor  ECG Heart Rate 99  Resp 16  Oxygen Therapy  SpO2 97 %  O2 Device Room Air  Pain Assessment  Pain Scale 0-10  Pain Score 6  Pain Type Acute pain  Pain Location Back  Pain Orientation Right;Left  Pain Descriptors / Indicators Aching;Discomfort  Pain Frequency Constant  Pain Onset Gradual  Patients Stated Pain Goal 0

## 2024-04-06 NOTE — Assessment & Plan Note (Signed)
Continue amiodarone. 

## 2024-04-06 NOTE — ED Notes (Signed)
 Called [facility] for transport.

## 2024-04-06 NOTE — Plan of Care (Signed)
 Plan of Care Note for accepted transfer   Patient: Katherine Hall MRN: [id number]   DOA: 04/06/2024  Facility requesting transfer: [facility] Requesting Provider: Dr. Monique Ano Reason for transfer: back and flank pain Facility course:  83 yo presented with back/flank pain and feeling febrile.  Afebrile but hypotensive, 91/54 WBC 18.9 Lactic 2.5 Creat 1.76 (normal baseline).  UA neg nitrite, small LE, >50 WBC CT: B/L perinephric stranding and distended GB. Small pleural effusions, 4 cm infrarenal AAA new since 2010.  Started on rocephin and given IVF.  Admitted for pyelo.   Plan of care: The patient is accepted for admission to Progressive unit, at [facility].  Author: Faith Homes, MD 04/06/2024  Check [email or web address] for on-call coverage.  Nursing staff, Please call [facility] Admits & Consults System-Wide number on Amion as soon as patient's arrival, so appropriate admitting provider can evaluate the pt.

## 2024-04-06 NOTE — Assessment & Plan Note (Signed)
 Continue PPI ?

## 2024-04-07 DIAGNOSIS — R6520 Severe sepsis without septic shock: Secondary | ICD-10-CM | POA: Diagnosis not present

## 2024-04-07 DIAGNOSIS — A419 Sepsis, unspecified organism: Secondary | ICD-10-CM | POA: Diagnosis not present

## 2024-04-07 LAB — CBC
HCT: 33.1 % — ABNORMAL LOW (ref 36.0–46.0)
Hemoglobin: 11 g/dL — ABNORMAL LOW (ref 12.0–15.0)
MCH: 31.9 pg (ref 26.0–34.0)
MCHC: 33.2 g/dL (ref 30.0–36.0)
MCV: 95.9 fL (ref 80.0–100.0)
Platelets: 257 10*3/uL (ref 150–400)
RBC: 3.45 MIL/uL — ABNORMAL LOW (ref 3.87–5.11)
RDW: 15.8 % — ABNORMAL HIGH (ref 11.5–15.5)
WBC: 17 10*3/uL — ABNORMAL HIGH (ref 4.0–10.5)
nRBC: 0 % (ref 0.0–0.2)

## 2024-04-07 LAB — COMPREHENSIVE METABOLIC PANEL WITH GFR
ALT: 17 U/L (ref 0–44)
AST: 24 U/L (ref 15–41)
Albumin: 2 g/dL — ABNORMAL LOW (ref 3.5–5.0)
Alkaline Phosphatase: 126 U/L (ref 38–126)
Anion gap: 11 (ref 5–15)
BUN: 19 mg/dL (ref 8–23)
CO2: 16 mmol/L — ABNORMAL LOW (ref 22–32)
Calcium: 7.7 mg/dL — ABNORMAL LOW (ref 8.9–10.3)
Chloride: 107 mmol/L (ref 98–111)
Creatinine, Ser: 1.07 mg/dL — ABNORMAL HIGH (ref 0.44–1.00)
GFR, Estimated: 52 mL/min — ABNORMAL LOW (ref >60)
Glucose, Bld: 98 mg/dL (ref 70–99)
Potassium: 3.9 mmol/L (ref 3.5–5.1)
Sodium: 134 mmol/L — ABNORMAL LOW (ref 135–145)
Total Bilirubin: 0.7 mg/dL (ref 0.0–1.2)
Total Protein: 5 g/dL — ABNORMAL LOW (ref 6.5–8.1)

## 2024-04-07 MED ORDER — EZETIMIBE 10 MG PO TABS
10.0000 mg | ORAL_TABLET | Freq: Every day | ORAL | Status: DC
  Administered 2024-04-07 – 2024-04-09 (×3): 10 mg via ORAL
  Filled 2024-04-07 (×3): qty 1

## 2024-04-07 MED ORDER — LEVOTHYROXINE SODIUM 50 MCG PO TABS
50.0000 ug | ORAL_TABLET | Freq: Every day | ORAL | Status: DC
  Administered 2024-04-08 – 2024-04-09 (×2): 50 ug via ORAL
  Filled 2024-04-07 (×2): qty 1

## 2024-04-07 MED ORDER — BEMPEDOIC ACID-EZETIMIBE 180-10 MG PO TABS: 1.0000 | ORAL_TABLET | Freq: Every day | ORAL | Status: DC

## 2024-04-07 MED ORDER — CILOSTAZOL 100 MG PO TABS
100.0000 mg | ORAL_TABLET | Freq: Two times a day (BID) | ORAL | Status: DC
  Administered 2024-04-07 – 2024-04-09 (×5): 100 mg via ORAL
  Filled 2024-04-07 (×6): qty 1

## 2024-04-07 MED ORDER — AMIODARONE HCL 200 MG PO TABS
200.0000 mg | ORAL_TABLET | Freq: Every day | ORAL | Status: DC
  Administered 2024-04-07 – 2024-04-09 (×3): 200 mg via ORAL
  Filled 2024-04-07 (×3): qty 1

## 2024-04-07 MED ORDER — RANOLAZINE ER 500 MG PO TB12: 500.0000 mg | ORAL_TABLET | Freq: Two times a day (BID) | ORAL | Status: DC

## 2024-04-07 MED ORDER — ROPINIROLE HCL 1 MG PO TABS
2.0000 mg | ORAL_TABLET | Freq: Every day | ORAL | Status: DC
  Administered 2024-04-07 – 2024-04-08 (×2): 2 mg via ORAL
  Filled 2024-04-07 (×2): qty 2

## 2024-04-07 MED ORDER — DIPHENHYDRAMINE-APAP (SLEEP) 25-500 MG PO TABS: 1.0000 | ORAL_TABLET | Freq: Every day | ORAL | Status: DC

## 2024-04-07 NOTE — Progress Notes (Signed)
 PROGRESS NOTE  [name] OVF:[id number] DOB: 03-04-1941 DOA: 04/06/2024 PCP: Jacquelyne Matte, DO   LOS: 1 day   Brief narrative:  Katherine Hall is a 83 y.o. female with medical history significant of PAD, HTN, hypothyroidism, hx of gastric ulcer/bleed, CAD s/p CABG in 2017 presented to the hospital with back pain and confusion with dysuria and hematuria.  She also had some vomiting and decreased oral intake with chills and subjective fever.  In the ED patient was hypotensive.  Labs showed leukocytosis with WBC at 18.9.  Potassium was low at 3.2 with elevated creatinine at 1.7.  Lactate was elevated at 2.5.  CT scan of the abdomen and pelvis done showed possible pyelonephritis.  Patient received  1L IVF, rocephin, morphine  and tylenol . BC/urine culture obtained.  Patient was then considered for admission to hospital for further evaluation and treatment.    Assessment/Plan: Principal Problem:   Severe sepsis from pyelonephritis Active Problems:   AKI (acute kidney injury) (HCC)   Hypokalemia   Essential hypertension   Coronary artery disease involving native coronary artery of native heart with angina pectoris (HCC)   Abdominal aortic aneurysm (AAA) 3.0 cm to 5.0 cm in diameter in female [facility])   Frequent PVCs   Peripheral arterial disease (HCC)   Hypothyroidism   Hyperlipidemia   Gastro-esophageal reflux disease without esophagitis   Acute pyelonephritis  * Severe sepsis from acute pyelonephritis Continue hydration IV antibiotics with Rocephin.  Follow blood cultures urine cultures.  Blood cultures negative in less than 24 hours.  Ultrasound of the abdomen with distended gallbladder.  Still symptomatic with dysuria urgency weakness and nonspecific abdominal pain.   AKI (acute kidney injury) secondary to sepsis and hypotension. Creatinine of 1.0.  Continue hydration.   Hypokalemia Latest potassium of 3.9..  Will continue to replace as necessary.  Check levels in AM.   Essential  hypertension Continue to hold valsartan  Lopressor  and Imdur .  Blood pressure was low on presentation.  Starting to improve now.   Coronary artery disease involving native coronary artery of native heart with angina pectoris  Hx of CABG x5 in 2017.  No issues at this time.    Abdominal aortic aneurysm (AAA) 3.0 cm to 5.0 cm in diameter in female  New finding on CT, measuring 4cm (since 2010), but followed by cardiology. Repeat CT or MR in 12 months and f/u with vascular    Frequent PVCs Continue amiodarone     Peripheral arterial disease  She is status post bilateral SFA endovascular intervention with drug-coated balloon angioplasty.    Hypothyroidism Continue Synthroid .   Hyperlipidemia Continue nexlizet  and repatha  . Statin intolerant    Gastro-esophageal reflux disease without esophagitis Continue PPI.   DVT prophylaxis: enoxaparin  (LOVENOX ) injection 30 mg Start: 04/06/24 1800   Disposition: Home  Status is: Inpatient Remains inpatient appropriate because: IV antibiotic, pending clinical improvement, pyelonephritis,    Code Status:     Code Status: Full Code  Family Communication: None at bedside  Consultants: None  Procedures: None  Anti-infectives:  Rocephin IV  Anti-infectives (From admission, onward)    Start     Dose/Rate Route Frequency Ordered Stop   04/07/24 0200  cefTRIAXone (ROCEPHIN) 2 g in sodium chloride  0.9 % 100 mL IVPB        2 g 200 mL/hr over 30 Minutes Intravenous Every 24 hours 04/06/24 1539     04/06/24 0115  cefTRIAXone (ROCEPHIN) 2 g in sodium chloride  0.9 % 100 mL IVPB  2 g 200 mL/hr over 30 Minutes Intravenous Once 04/06/24 0109 04/06/24 0235        Subjective: Today, patient was seen and examined at bedside.  Patient complains of not feeling well dysuria nausea but no vomiting.  Had some breakfast.  Feels lousy Objective: Vitals:   04/07/24 0307 04/07/24 0855  BP: (!) 157/82 (!) 143/76  Pulse: 85 95  Resp: 20 16   Temp: 97.6 F (36.4 C) 98.4 F (36.9 C)  SpO2: 98% 96%    Intake/Output Summary (Last 24 hours) at 04/07/2024 1025 Last data filed at 04/07/2024 0600 Gross per 24 hour  Intake 245.66 ml  Output 1000 ml  Net -754.34 ml   Filed Weights   04/06/24 0038  Weight: 49.9 kg   Body mass index is 25.57 kg/m.   Physical Exam: GENERAL: Patient is alert awake and oriented. .  Elderly female, Communicative, not in obvious distress, HENT: No scleral pallor or icterus. Pupils equally reactive to light. Oral mucosa is moist NECK: is supple, no gross swelling noted. CHEST: Clear to auscultation. No crackles or wheezes.  Diminished breath sounds bilaterally. CVS: S1 and S2 heard, no murmur. Regular rate and rhythm.  Mild costovertebral angle tenderness bilaterally. ABDOMEN: Soft, non-tender, bowel sounds are present.  External urinary catheter in place. EXTREMITIES: No edema. CNS: Cranial nerves are intact. No focal motor deficits. SKIN: warm and dry without rashes.  Data Review: I have personally reviewed the following laboratory data and studies,  CBC: Recent Labs  Lab 04/06/24 0045 04/07/24 0416  WBC 18.9* 17.0*  HGB 12.2 11.0*  HCT 36.2 33.1*  MCV 94.3 95.9  PLT 294 257   Basic Metabolic Panel: Recent Labs  Lab 04/06/24 0045 04/06/24 1635 04/07/24 0416  NA 133* 135 134*  K 3.2* 3.1* 3.9  CL 93* 104 107  CO2 19* 18* 16*  GLUCOSE 125* 106* 98  BUN 24* 19 19  CREATININE 1.76* 1.14* 1.07*  CALCIUM  9.4 8.2* 7.7*  MG  --  1.8  --    Liver Function Tests: Recent Labs  Lab 04/06/24 0045 04/07/24 0416  AST 53* 24  ALT 32 17  ALKPHOS 206* 126  BILITOT 0.6 0.7  PROT 6.5 5.0*  ALBUMIN  3.6 2.0*   No results for input(s): "LIPASE", "AMYLASE" in the last 168 hours. No results for input(s): "AMMONIA" in the last 168 hours. Cardiac Enzymes: No results for input(s): "CKTOTAL", "CKMB", "CKMBINDEX", "TROPONINI" in the last 168 hours. BNP (last 3 results) No results for  input(s): "BNP" in the last 8760 hours.  ProBNP (last 3 results) No results for input(s): "PROBNP" in the last 8760 hours.  CBG: No results for input(s): "GLUCAP" in the last 168 hours. Recent Results (from the past 240 hours)  Culture, blood (single)     Status: None (Preliminary result)   Collection Time: 04/06/24  2:00 AM   Specimen: BLOOD  Result Value Ref Range Status   Specimen Description   Final    BLOOD LEFT ANTECUBITAL Performed at [facility], [address] [place], [place] [address]    Special Requests   Final    BOTTLES DRAWN AEROBIC AND ANAEROBIC Blood Culture results may not be optimal due to an inadequate volume of blood received in culture bottles Performed at [facility], [address] [place], [place] [address]    Culture   Final    NO GROWTH < 24 HOURS Performed at [facility], [address] [place], [place] [address]    Report Status PENDING  Incomplete  Culture, blood (routine x 2)     Status: None (Preliminary result)   Collection Time: 04/06/24  2:10 AM   Specimen: BLOOD LEFT ARM  Result Value Ref Range Status   Specimen Description   Final    BLOOD LEFT ARM Performed at [facility], [address] [place], [place] [address]    Special Requests   Final    BOTTLES DRAWN AEROBIC AND ANAEROBIC Blood Culture results may not be optimal due to an inadequate volume of blood received in culture bottles Performed at [facility], [address] [place], [place] [address]    Culture   Final    NO GROWTH < 24 HOURS Performed at [facility], [address] [place], [place] [address]    Report Status PENDING  Incomplete     Studies: CT ABDOMEN PELVIS WO CONTRAST Result Date: 04/06/2024 CLINICAL DATA:  Abdominal/flank pain, stone suspected EXAM: CT ABDOMEN AND PELVIS WITHOUT CONTRAST TECHNIQUE: Multidetector CT imaging of the abdomen and pelvis was performed following the standard protocol  without IV contrast. RADIATION DOSE REDUCTION: This exam was performed according to the departmental dose-optimization program which includes automated exposure control, adjustment of the mA and/or kV according to patient size and/or use of iterative reconstruction technique. COMPARISON:  CT 05/27/2009. There is a report from CT 11/07/2019 however no images are available. FINDINGS: Lower chest: Respiratory motion obscures detail. Small left pleural effusion and associated airspace opacities. Hepatobiliary: Stable hepatic cyst. Distended gallbladder. No biliary dilation or radiopaque stone. Pancreas: Unremarkable. Spleen: Unremarkable. Adrenals/Urinary Tract: Normal adrenal glands. No urinary calculi or hydronephrosis. Unremarkable bladder. Stomach/Bowel: Normal caliber large and small bowel. No bowel wall thickening. The appendix is not visualized. Small hiatal hernia. Vascular/Lymphatic: Aortic atherosclerotic calcification. 4.0 x 3.5 cm infrarenal abdominal aortic aneurysm is new since 2010. Reproductive: Unremarkable. Other: Nonspecific bilateral perinephric stranding with fluid extending inferiorly in the anterior pararenal space. No free intraperitoneal fluid or air. No abscess. Musculoskeletal: No acute fracture. Grade 1-2 anterolisthesis of L4. IMPRESSION: *Nonspecific bilateral perinephric stranding with fluid extending inferiorly in the anterior pararenal space. This could be due to pyelonephritis. Correlate with urinalysis. No urinary calculi or hydronephrosis. *Distended gallbladder. If there is right upper quadrant pain consider ultrasound for further evaluation. *Small left pleural effusion and associated airspace opacities. Atelectasis versus pneumonia. *4.0 cm infrarenal abdominal aortic aneurysm is new since 2010. Recommend follow-up CT or MR as appropriate in 12 months and referral to or continued care with vascular specialist. (Ref.: J Vasc Surg. 2018; 67:2-77 and J Am Coll Radiol  2013;10(10):789-794.) *Aortic Atherosclerosis (ICD10-I70.0). Electronically Signed   By: Rozell Cornet M.D.   On: 04/06/2024 02:11      Rosena Conradi, MD  [facility] 04/07/2024  If 7PM-7AM, please contact night-coverage

## 2024-04-07 NOTE — Plan of Care (Signed)
  Problem: Clinical Measurements: Goal: Will remain free from infection Outcome: Progressing   Problem: Clinical Measurements: Goal: Diagnostic test results will improve Outcome: Progressing   Problem: Clinical Measurements: Goal: Respiratory complications will improve Outcome: Progressing   Problem: Nutrition: Goal: Adequate nutrition will be maintained Outcome: Progressing   Problem: Coping: Goal: Level of anxiety will decrease Outcome: Progressing   Problem: Pain Managment: Goal: General experience of comfort will improve and/or be controlled Outcome: Progressing   Problem: Safety: Goal: Ability to remain free from injury will improve Outcome: Progressing

## 2024-04-08 DIAGNOSIS — R6520 Severe sepsis without septic shock: Secondary | ICD-10-CM | POA: Diagnosis not present

## 2024-04-08 DIAGNOSIS — A419 Sepsis, unspecified organism: Secondary | ICD-10-CM | POA: Diagnosis not present

## 2024-04-08 LAB — BASIC METABOLIC PANEL WITH GFR
Anion gap: 8 (ref 5–15)
BUN: 15 mg/dL (ref 8–23)
CO2: 20 mmol/L — ABNORMAL LOW (ref 22–32)
Calcium: 7.8 mg/dL — ABNORMAL LOW (ref 8.9–10.3)
Chloride: 103 mmol/L (ref 98–111)
Creatinine, Ser: 0.79 mg/dL (ref 0.44–1.00)
GFR, Estimated: >60 mL/min (ref >60)
Glucose, Bld: 117 mg/dL — ABNORMAL HIGH (ref 70–99)
Potassium: 3.2 mmol/L — ABNORMAL LOW (ref 3.5–5.1)
Sodium: 131 mmol/L — ABNORMAL LOW (ref 135–145)

## 2024-04-08 LAB — CBC
HCT: 30.1 % — ABNORMAL LOW (ref 36.0–46.0)
Hemoglobin: 10 g/dL — ABNORMAL LOW (ref 12.0–15.0)
MCH: 31 pg (ref 26.0–34.0)
MCHC: 33.2 g/dL (ref 30.0–36.0)
MCV: 93.2 fL (ref 80.0–100.0)
Platelets: 284 10*3/uL (ref 150–400)
RBC: 3.23 MIL/uL — ABNORMAL LOW (ref 3.87–5.11)
RDW: 15.5 % (ref 11.5–15.5)
WBC: 13.4 10*3/uL — ABNORMAL HIGH (ref 4.0–10.5)
nRBC: 0 % (ref 0.0–0.2)

## 2024-04-08 LAB — URINE CULTURE: Culture: >=100000 — AB

## 2024-04-08 LAB — BLOOD CULTURE ID PANEL (REFLEXED) - BCID2

## 2024-04-08 LAB — MAGNESIUM: Magnesium: 1.9 mg/dL (ref 1.7–2.4)

## 2024-04-08 MED ORDER — PANTOPRAZOLE SODIUM 20 MG PO TBEC
20.0000 mg | DELAYED_RELEASE_TABLET | Freq: Two times a day (BID) | ORAL | Status: DC
  Administered 2024-04-08 – 2024-04-09 (×3): 20 mg via ORAL
  Filled 2024-04-08 (×3): qty 1

## 2024-04-08 MED ORDER — METOPROLOL TARTRATE 25 MG PO TABS
25.0000 mg | ORAL_TABLET | Freq: Every day | ORAL | Status: DC
  Administered 2024-04-08 – 2024-04-09 (×2): 25 mg via ORAL
  Filled 2024-04-08 (×2): qty 1

## 2024-04-08 MED ORDER — IRBESARTAN 150 MG PO TABS
75.0000 mg | ORAL_TABLET | Freq: Every day | ORAL | Status: DC
  Administered 2024-04-08 – 2024-04-09 (×2): 75 mg via ORAL
  Filled 2024-04-08 (×2): qty 1

## 2024-04-08 MED ORDER — POTASSIUM CHLORIDE CRYS ER 20 MEQ PO TBCR
40.0000 meq | EXTENDED_RELEASE_TABLET | ORAL | Status: AC
  Administered 2024-04-08 (×2): 40 meq via ORAL
  Filled 2024-04-08 (×2): qty 2

## 2024-04-08 NOTE — Progress Notes (Signed)
 PHARMACY - PHYSICIAN COMMUNICATION CRITICAL VALUE ALERT - BLOOD CULTURE IDENTIFICATION (BCID)  Katherine Hall is an 83 y.o. female who presented to [facility] on 04/06/2024 with a chief complaint of pyelonephritis.   Assessment:  83 year old female admitted with dysuria/back pain and concern for pyelonephritis. Now with E coli in 1/4 blood cultures likely from urinary source.   Name of physician (or Provider) Contacted: Pokhrel   Current antibiotics: Ceftriaxone 2 gm IV Q 24 hours   Changes to prescribed antibiotics recommended:  Patient is on recommended antibiotics - No changes needed  Results for orders placed or performed during the hospital encounter of 04/06/24  Blood Culture ID Panel (Reflexed) (Collected: 04/06/2024  2:00 AM)  Result Value Ref Range   Enterococcus faecalis NOT DETECTED NOT DETECTED   Enterococcus Faecium NOT DETECTED NOT DETECTED   Listeria monocytogenes NOT DETECTED NOT DETECTED   Staphylococcus species NOT DETECTED NOT DETECTED   Staphylococcus aureus (BCID) NOT DETECTED NOT DETECTED   Staphylococcus epidermidis NOT DETECTED NOT DETECTED   Staphylococcus lugdunensis NOT DETECTED NOT DETECTED   Streptococcus species NOT DETECTED NOT DETECTED   Streptococcus agalactiae NOT DETECTED NOT DETECTED   Streptococcus pneumoniae NOT DETECTED NOT DETECTED   Streptococcus pyogenes NOT DETECTED NOT DETECTED   A.calcoaceticus-baumannii NOT DETECTED NOT DETECTED   Bacteroides fragilis NOT DETECTED NOT DETECTED   Enterobacterales DETECTED (A) NOT DETECTED   Enterobacter cloacae complex NOT DETECTED NOT DETECTED   Escherichia coli DETECTED (A) NOT DETECTED   Klebsiella aerogenes NOT DETECTED NOT DETECTED   Klebsiella oxytoca NOT DETECTED NOT DETECTED   Klebsiella pneumoniae NOT DETECTED NOT DETECTED   Proteus species NOT DETECTED NOT DETECTED   Salmonella species NOT DETECTED NOT DETECTED   Serratia marcescens NOT DETECTED NOT DETECTED   Haemophilus influenzae NOT  DETECTED NOT DETECTED   Neisseria meningitidis NOT DETECTED NOT DETECTED   Pseudomonas aeruginosa NOT DETECTED NOT DETECTED   Stenotrophomonas maltophilia NOT DETECTED NOT DETECTED   Candida albicans NOT DETECTED NOT DETECTED   Candida auris NOT DETECTED NOT DETECTED   Candida glabrata NOT DETECTED NOT DETECTED   Candida krusei NOT DETECTED NOT DETECTED   Candida parapsilosis NOT DETECTED NOT DETECTED   Candida tropicalis NOT DETECTED NOT DETECTED   Cryptococcus neoformans/gattii NOT DETECTED NOT DETECTED   CTX-M ESBL NOT DETECTED NOT DETECTED   Carbapenem resistance IMP NOT DETECTED NOT DETECTED   Carbapenem resistance KPC NOT DETECTED NOT DETECTED   Carbapenem resistance NDM NOT DETECTED NOT DETECTED   Carbapenem resist OXA 48 LIKE NOT DETECTED NOT DETECTED   Carbapenem resistance VIM NOT DETECTED NOT DETECTED    Denson Flake, PharmD, BCPS, BCIDP Infectious Diseases Clinical Pharmacist Phone: [phone] 04/08/2024  8:13 AM

## 2024-04-08 NOTE — Progress Notes (Signed)
 PROGRESS NOTE  Katherine Hall ACZ:[id number] DOB: 01/08/1941 DOA: 04/06/2024 PCP: Jacquelyne Matte, DO   LOS: 2 days   Brief narrative:  Katherine Hall is a 83 y.o. female with medical history significant of PAD, HTN, hypothyroidism, hx of gastric ulcer/bleed, CAD s/p CABG in 2017 presented to the hospital with back pain and confusion with dysuria and hematuria.  She also had some vomiting and decreased oral intake with chills and subjective fever.  In the ED, patient was hypotensive.  Labs showed leukocytosis with WBC at 18.9.  Potassium was low at 3.2 with elevated creatinine at 1.7.  Lactate was elevated at 2.5.  CT scan of the abdomen and pelvis done showed possible pyelonephritis.  Patient received  1L IVF, rocephin, morphine  and tylenol . BC/urine culture obtained.  Patient was then considered for admission to hospital for further evaluation and treatment.    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  Continue IV Rocephin.  Blood cultures with E. coli bacteremia, urine culture with E. coli.  Follow sensitivity.  Patient clinically improving  Ultrasound of the abdomen with distended gallbladder.  Still symptomatic with dysuria, urine Adolfo Hooker urgency weakness and loin pain.   AKI (acute kidney injury) secondary to sepsis and hypotension. Creatinine of 1.0.  Continue hydration.   Hypokalemia Latest potassium of 3.2.  Will continue to replenish Q4 hourly x 2.  Check levels in  AM.   Essential hypertension Continue to hold valsartan ., Lopressor  and Imdur .  Blood pressure was low on presentation.  Blood pressure has been elevated at this time.  Will resume home medications.   Coronary artery disease involving native coronary artery of native heart with angina pectoris  Hx of CABG x5 in 2017.  No issues at this time.    Abdominal aortic aneurysm (AAA) 3.0 cm to 5.0 cm in diameter in female  New finding on CT, measuring 4cm (since 2010), but followed by cardiology. Repeat CT or MR in 12 months and f/u with vascular    Frequent PVCs Continue amiodarone     Peripheral arterial disease  She is status post bilateral SFA endovascular intervention with drug-coated balloon angioplasty.    Hypothyroidism Continue Synthroid .   Hyperlipidemia Continue nexlizet  and repatha  . Statin intolerant    Gastro-esophageal reflux disease without esophagitis Continue PPI.   DVT prophylaxis: enoxaparin  (LOVENOX ) injection 30 mg Start: 04/06/24 1800   Disposition: Home likely 04/09/2024 if continues to improve.  Will get PT evaluation.  Status is: Inpatient Remains inpatient appropriate because: IV antibiotic, pending clinical improvement, pyelonephritis,    Code Status:     Code Status: Full Code  Family Communication: Spoke with patient's spouse on the phone and updated him about the clinical condition of the patient.  Consultants: None  Procedures: None  Anti-infectives:  Rocephin IV  Anti-infectives (From admission, onward)    Start     Dose/Rate Route Frequency Ordered Stop   04/07/24 0200  cefTRIAXone (ROCEPHIN) 2 g in sodium chloride  0.9 % 100 mL IVPB  2 g 200 mL/hr over 30 Minutes Intravenous Every 24 hours 04/06/24 1539     04/06/24 0115  cefTRIAXone (ROCEPHIN) 2 g in sodium chloride  0.9 % 100 mL IVPB        2 g 200 mL/hr over 30 Minutes Intravenous Once 04/06/24 0109 04/06/24 0235        Subjective: Today, patient was seen and examined at  bedside.  Patient still complains of dysuria urinary burning sensation and generalized weakness.  No nausea or vomiting.     Objective: Vitals:   04/08/24 0913 04/08/24 0936  BP: (!) 184/88 (!) 181/95  Pulse: 82 81  Resp: 19 19  Temp:    SpO2: 97% 95%    Intake/Output Summary (Last 24 hours) at 04/08/2024 0958 Last data filed at 04/07/2024 1731 Gross per 24 hour  Intake 480 ml  Output 300 ml  Net 180 ml   Filed Weights   04/06/24 0038  Weight: 49.9 kg   Body mass index is 25.57 kg/m.   Physical Exam: GENERAL: Patient is alert awake and oriented. .  Elderly female, Communicative, not in obvious distress, oriented HENT: No scleral pallor or icterus. Pupils equally reactive to light. Oral mucosa is moist NECK: is supple, no gross swelling noted. CHEST: Clear to auscultation. No crackles or wheezes.  Diminished breath sounds bilaterally. CVS: S1 and S2 heard, no murmur. Regular rate and rhythm.  Mild costovertebral angle tenderness mostly on the right side. ABDOMEN: Soft, non-tender, bowel sounds are present.  External urinary catheter in place. EXTREMITIES: No edema. CNS: Cranial nerves are intact. No focal motor deficits.  Generalized weakness noted. SKIN: warm and dry without rashes.  Data Review: I have personally reviewed the following laboratory data and studies,  CBC: Recent Labs  Lab 04/06/24 0045 04/07/24 0416 04/08/24 0406  WBC 18.9* 17.0* 13.4*  HGB 12.2 11.0* 10.0*  HCT 36.2 33.1* 30.1*  MCV 94.3 95.9 93.2  PLT 294 257 284   Basic Metabolic Panel: Recent Labs  Lab 04/06/24 0045 04/06/24 1635 04/07/24 0416 04/08/24 0406  NA 133* 135 134* 131*  K 3.2* 3.1* 3.9 3.2*  CL 93* 104 107 103  CO2 19* 18* 16* 20*  GLUCOSE 125* 106* 98 117*  BUN 24* 19 19 15   CREATININE 1.76* 1.14* 1.07* 0.79  CALCIUM  9.4 8.2* 7.7* 7.8*  MG  --  1.8  --  1.9   Liver Function Tests: Recent Labs  Lab 04/06/24 0045 04/07/24 0416  AST 53* 24  ALT 32 17  ALKPHOS 206*  126  BILITOT 0.6 0.7  PROT 6.5 5.0*  ALBUMIN  3.6 2.0*   No results for input(s): "LIPASE", "AMYLASE" in the last 168 hours. No results for input(s): "AMMONIA" in the last 168 hours. Cardiac Enzymes: No results for input(s): "CKTOTAL", "CKMB", "CKMBINDEX", "TROPONINI" in the last 168 hours. BNP (last 3 results) No results for input(s): "BNP" in the last 8760 hours.  ProBNP (last 3 results) No results for input(s): "PROBNP" in the last 8760 hours.  CBG: No results for input(s): "GLUCAP" in the last 168 hours. Recent Results (from the past 240 hours)  Urine Culture     Status: Abnormal   Collection Time: 04/06/24 12:44 AM   Specimen: Urine, Clean Catch  Result Value Ref Range Status   Specimen Description   Final    URINE, CLEAN CATCH Performed at [facility], [address], [place], [place] [address]    Special Requests   Final  NONE Performed at [facility], [address] [place], [place] [address]    Culture >=100,000 COLONIES/mL ESCHERICHIA COLI (A)  Final   Report Status 04/08/2024 FINAL  Final   Organism ID, Bacteria ESCHERICHIA COLI (A)  Final      Susceptibility   Escherichia coli - MIC*    AMPICILLIN 8 SENSITIVE Sensitive     CEFAZOLIN <=4 SENSITIVE Sensitive     CEFEPIME <=0.12 SENSITIVE Sensitive     CEFTRIAXONE <=0.25 SENSITIVE Sensitive     CIPROFLOXACIN <=0.25 SENSITIVE Sensitive     GENTAMICIN <=1 SENSITIVE Sensitive     IMIPENEM <=0.25 SENSITIVE Sensitive     NITROFURANTOIN <=16 SENSITIVE Sensitive     TRIMETH/SULFA <=20 SENSITIVE Sensitive     AMPICILLIN/SULBACTAM <=2 SENSITIVE Sensitive     PIP/TAZO <=4 SENSITIVE Sensitive ug/mL    * >=100,000 COLONIES/mL ESCHERICHIA COLI  Culture, blood (single)     Status: None (Preliminary result)   Collection Time: 04/06/24  2:00 AM   Specimen: BLOOD  Result Value Ref Range Status   Specimen Description   Final    BLOOD LEFT ANTECUBITAL Performed at [facility], [address] [place], [place] [address]    Special Requests   Final    BOTTLES DRAWN AEROBIC AND ANAEROBIC Blood Culture results may not be optimal due to an inadequate volume of blood received in culture bottles Performed at [facility], [address] [place], [place] [address]    Culture  Setup Time   Final    GRAM NEGATIVE RODS ANAEROBIC BOTTLE ONLY Organism ID to follow CRITICAL RESULT CALLED TO, READ BACK BY AND VERIFIED WITHGenna Khan PHARMD, AT [phone] Cristela Donald Performed at [facility], [address] [place], [place] [address]    Culture GRAM NEGATIVE RODS  Final   Report Status PENDING  Incomplete  Blood Culture ID Panel (Reflexed)     Status: Abnormal   Collection Time: 04/06/24  2:00 AM  Result Value Ref Range Status   Enterococcus faecalis NOT DETECTED NOT DETECTED Final   Enterococcus Faecium NOT DETECTED NOT DETECTED Final   Listeria monocytogenes NOT DETECTED NOT DETECTED Final   Staphylococcus species NOT DETECTED NOT DETECTED Final   Staphylococcus aureus (BCID) NOT DETECTED NOT DETECTED Final   Staphylococcus epidermidis NOT DETECTED NOT DETECTED Final   Staphylococcus lugdunensis NOT DETECTED NOT DETECTED Final   Streptococcus species NOT DETECTED NOT DETECTED Final   Streptococcus agalactiae NOT DETECTED NOT DETECTED Final   Streptococcus pneumoniae NOT DETECTED NOT DETECTED Final   Streptococcus pyogenes NOT DETECTED NOT DETECTED Final   A.calcoaceticus-baumannii NOT DETECTED NOT DETECTED Final   Bacteroides fragilis NOT DETECTED NOT DETECTED Final   Enterobacterales DETECTED (A) NOT DETECTED Final    Comment: Enterobacterales represent a large order of gram negative bacteria, not a single organism. CRITICAL RESULT CALLED TO, READ BACK BY AND VERIFIED WITH: E. SINCLAIR PHARMD, AT [phone] D. VANHOOK    Enterobacter cloacae complex NOT DETECTED NOT DETECTED Final   Escherichia coli DETECTED (A) NOT DETECTED Final    Comment:  CRITICAL RESULT CALLED TO, READ BACK BY AND VERIFIED WITH: E. SINCLAIR PHARMD, AT [phone] D. VANHOOK    Klebsiella aerogenes NOT DETECTED NOT DETECTED Final   Klebsiella oxytoca NOT DETECTED NOT DETECTED Final   Klebsiella pneumoniae NOT DETECTED NOT DETECTED Final   Proteus species NOT DETECTED NOT DETECTED Final   Salmonella species NOT DETECTED  NOT DETECTED Final   Serratia marcescens NOT DETECTED NOT DETECTED Final   Haemophilus influenzae NOT DETECTED NOT DETECTED Final   Neisseria meningitidis NOT DETECTED NOT DETECTED Final   Pseudomonas aeruginosa NOT DETECTED NOT DETECTED Final   Stenotrophomonas maltophilia NOT DETECTED NOT DETECTED Final   Candida albicans NOT DETECTED NOT DETECTED Final   Candida auris NOT DETECTED NOT DETECTED Final   Candida glabrata NOT DETECTED NOT DETECTED Final   Candida krusei NOT DETECTED NOT DETECTED Final   Candida parapsilosis NOT DETECTED NOT DETECTED Final   Candida tropicalis NOT DETECTED NOT DETECTED Final   Cryptococcus neoformans/gattii NOT DETECTED NOT DETECTED Final   CTX-M ESBL NOT DETECTED NOT DETECTED Final   Carbapenem resistance IMP NOT DETECTED NOT DETECTED Final   Carbapenem resistance KPC NOT DETECTED NOT DETECTED Final   Carbapenem resistance NDM NOT DETECTED NOT DETECTED Final   Carbapenem resist OXA 48 LIKE NOT DETECTED NOT DETECTED Final   Carbapenem resistance VIM NOT DETECTED NOT DETECTED Final    Comment: Performed at [facility], [address] [place], [place] [address]  Culture, blood (routine x 2)     Status: None (Preliminary result)   Collection Time: 04/06/24  2:10 AM   Specimen: BLOOD LEFT ARM  Result Value Ref Range Status   Specimen Description   Final    BLOOD LEFT ARM Performed at [facility], [address] [place], [place] [address]    Special Requests   Final    BOTTLES DRAWN AEROBIC AND ANAEROBIC Blood Culture results may not be optimal due to an inadequate volume of blood  received in culture bottles Performed at [facility], [address] [place], [place] [address]    Culture   Final    NO GROWTH 2 DAYS Performed at [facility], [address] [place], [place] [address]    Report Status PENDING  Incomplete     Studies: No results found.     Corwin Kuiken, MD  [facility] 04/08/2024  If 7PM-7AM, please contact night-coverage

## 2024-04-08 NOTE — Progress Notes (Signed)
 Pt's BP was on higher side, she was nauseated, denied headache dizziness and blurred vision. PRN metoprolol  5mg  given, will continue to monitor.   04/08/24 0936  Vitals  BP (!) 181/95  MAP (mmHg) 118  BP Location Left Arm  BP Method Automatic  Patient Position (if appropriate) Sitting  Pulse Rate 81  Pulse Rate Source Monitor  ECG Heart Rate 85  Resp 19  Level of Consciousness  Level of Consciousness Alert  MEWS COLOR  MEWS Score Color Green  Oxygen Therapy  SpO2 95 %  O2 Device Room Air  MEWS Score  MEWS Temp 0  MEWS Systolic 0  MEWS Pulse 0  MEWS RR 0  MEWS LOC 0  MEWS Score 0

## 2024-04-08 NOTE — Evaluation (Signed)
 Physical Therapy Evaluation Patient Details Name: Katherine Hall MRN: [id number] DOB: September 01, 1941 Today's Date: 04/08/2024  History of Present Illness  83 y.o. female who presented to ED 04/06/24 with complaints of back pain, concerns for sepsis, and confusion. Hypotension, pyelonephritis,  PMH: CAD s/p CABG, HTN, PVD s/p angioplasty, chronic anemia.  Clinical Impression  Pt admitted secondary to problem above with deficits below. PTA patient was living with spouse in one level home with 1 step to enter. She used rollator vs cane for ambulation and was independent with basic ADLs and husband assisted with IADLs. Pt currently requires CGA for OOB to chair and was limited by nausea with dry heaves (unable to progress to ambulation). Will need further PT assessment prior to discharge home. Anticipate patient will benefit from PT to address problems listed below. Will continue to follow acutely to maximize functional mobility, independence, and safety.           If plan is discharge home, recommend the following: A little help with walking and/or transfers;Assistance with cooking/housework;Help with stairs or ramp for entrance   Can travel by private vehicle        Equipment Recommendations None recommended by PT  Recommendations for Other Services       Functional Status Assessment Patient has had a recent decline in their functional status and demonstrates the ability to make significant improvements in function in a reasonable and predictable amount of time.     Precautions / Restrictions Precautions Precautions: Fall Precaution/Restrictions Comments: keep MAP>65      Mobility  Bed Mobility Overal bed mobility: Needs Assistance Bed Mobility: Supine to Sit     Supine to sit: Supervision     General bed mobility comments: no cues or assist needed; supervision for safety due to mild dizziness    Transfers Overall transfer level: Needs assistance Equipment used: 1 person hand held  assist Transfers: Sit to/from Stand, Bed to chair/wheelchair/BSC Sit to Stand: Contact guard assist   Step pivot transfers: Contact guard assist       General transfer comment: limited to transfer only due to nausea with dry heaves    Ambulation/Gait               General Gait Details: deferred due to nausea with dry heaves  Stairs            Wheelchair Mobility     Tilt Bed    Modified Rankin (Stroke Patients Only)       Balance Overall balance assessment: Mild deficits observed, not formally tested                                           Pertinent Vitals/Pain Pain Assessment Pain Assessment: No/denies pain    Home Living Family/patient expects to be discharged to:: Private residence Living Arrangements: Spouse/significant other Available Help at Discharge: Family Type of Home: House Home Access: Stairs to enter Entrance Stairs-Rails: None Entrance Stairs-Number of Steps: 1   Home Layout: One level Home Equipment: Rollator (4 wheels);Cane - single point      Prior Function Prior Level of Function : Independent/Modified Independent             Mobility Comments: uses rollator or cane as needed ADLs Comments: gets in/out of tub independently; dresses herself ; husband does housework; assists her in/out of car     Extremity/Trunk Assessment  Upper Extremity Assessment Upper Extremity Assessment: Generalized weakness    Lower Extremity Assessment Lower Extremity Assessment: Generalized weakness    Cervical / Trunk Assessment Cervical / Trunk Assessment: Normal  Communication   Communication Communication: Impaired Factors Affecting Communication: Hearing impaired    Cognition Arousal: Alert Behavior During Therapy: Flat affect   PT - Cognitive impairments: No apparent impairments                         Following commands: Intact       Cueing Cueing Techniques: Verbal cues     General  Comments General comments (skin integrity, edema, etc.): BP after transfer 187/82; sats 99% on RA    Exercises     Assessment/Plan    PT Assessment Patient needs continued PT services  PT Problem List Decreased strength;Decreased activity tolerance;Decreased balance;Decreased mobility;Decreased knowledge of use of DME       PT Treatment Interventions DME instruction;Gait training;Functional mobility training;Therapeutic activities;Therapeutic exercise;Balance training;Patient/family education    PT Goals (Current goals can be found in the Care Plan section)  Acute Rehab PT Goals Patient Stated Goal: feel better PT Goal Formulation: With patient Time For Goal Achievement: 04/22/24 Potential to Achieve Goals: Good    Frequency Min 2X/week     Co-evaluation               AM-PAC PT "6 Clicks" Mobility  Outcome Measure Help needed turning from your back to your side while in a flat bed without using bedrails?: None Help needed moving from lying on your back to sitting on the side of a flat bed without using bedrails?: A Little Help needed moving to and from a bed to a chair (including a wheelchair)?: A Little Help needed standing up from a chair using your arms (e.g., wheelchair or bedside chair)?: A Little Help needed to walk in hospital room?: Total Help needed climbing 3-5 steps with a railing? : Total 6 Click Score: 15    End of Session Equipment Utilized During Treatment: Gait belt Activity Tolerance: Treatment limited secondary to medical complications (Comment) (nausea) Patient left: in chair;with call bell/phone within reach;with chair alarm set;with nursing/sitter in room Nurse Communication: Mobility status;Other (comment) (nausea) PT Visit Diagnosis: Unsteadiness on feet (R26.81);Muscle weakness (generalized) (M62.81)    Time: 1610-9604 PT Time Calculation (min) (ACUTE ONLY): 33 min   Charges:   PT Evaluation $PT Eval Low Complexity: 1 Low PT  Treatments $Therapeutic Activity: 8-22 mins PT General Charges $$ ACUTE PT VISIT: 1 Visit          Gayle Kava, PT Acute Rehabilitation Services  Office [phone]   Guilford Leep 04/08/2024, 9:18 AM

## 2024-04-09 ENCOUNTER — Other Ambulatory Visit (HOSPITAL_COMMUNITY): Payer: Self-pay

## 2024-04-09 DIAGNOSIS — R6520 Severe sepsis without septic shock: Secondary | ICD-10-CM | POA: Diagnosis not present

## 2024-04-09 DIAGNOSIS — A419 Sepsis, unspecified organism: Secondary | ICD-10-CM | POA: Diagnosis not present

## 2024-04-09 LAB — CBC
HCT: 29.5 % — ABNORMAL LOW (ref 36.0–46.0)
Hemoglobin: 10.1 g/dL — ABNORMAL LOW (ref 12.0–15.0)
MCH: 31.8 pg (ref 26.0–34.0)
MCHC: 34.2 g/dL (ref 30.0–36.0)
MCV: 92.8 fL (ref 80.0–100.0)
Platelets: 302 10*3/uL (ref 150–400)
RBC: 3.18 MIL/uL — ABNORMAL LOW (ref 3.87–5.11)
RDW: 15.3 % (ref 11.5–15.5)
WBC: 10.6 10*3/uL — ABNORMAL HIGH (ref 4.0–10.5)
nRBC: 0 % (ref 0.0–0.2)

## 2024-04-09 LAB — BASIC METABOLIC PANEL WITH GFR
Anion gap: 8 (ref 5–15)
BUN: 13 mg/dL (ref 8–23)
CO2: 22 mmol/L (ref 22–32)
Calcium: 8.2 mg/dL — ABNORMAL LOW (ref 8.9–10.3)
Chloride: 103 mmol/L (ref 98–111)
Creatinine, Ser: 0.73 mg/dL (ref 0.44–1.00)
GFR, Estimated: >60 mL/min (ref >60)
Glucose, Bld: 113 mg/dL — ABNORMAL HIGH (ref 70–99)
Potassium: 4.3 mmol/L (ref 3.5–5.1)
Sodium: 133 mmol/L — ABNORMAL LOW (ref 135–145)

## 2024-04-09 LAB — MAGNESIUM: Magnesium: 1.8 mg/dL (ref 1.7–2.4)

## 2024-04-09 MED ORDER — DOCUSATE SODIUM 100 MG PO CAPS
100.0000 mg | ORAL_CAPSULE | Freq: Two times a day (BID) | ORAL | Status: DC
  Administered 2024-04-09: 100 mg via ORAL
  Filled 2024-04-09: qty 1

## 2024-04-09 MED ORDER — AMIODARONE HCL 200 MG PO TABS: 200.0000 mg | ORAL_TABLET | Freq: Every day | ORAL | Status: AC

## 2024-04-09 MED ORDER — POLYETHYLENE GLYCOL 3350 17 G PO PACK
17.0000 g | PACK | Freq: Every day | ORAL | Status: DC
  Administered 2024-04-09: 17 g via ORAL
  Filled 2024-04-09: qty 1

## 2024-04-09 MED ORDER — POLYETHYLENE GLYCOL 3350 17 GM/SCOOP PO POWD
17.0000 g | Freq: Every day | ORAL | 0 refills | Status: AC | PRN
  Filled 2024-04-09: qty 238, 14d supply, fill #0

## 2024-04-09 MED ORDER — CEPHALEXIN 500 MG PO CAPS
500.0000 mg | ORAL_CAPSULE | Freq: Two times a day (BID) | ORAL | 0 refills | Status: AC
  Filled 2024-04-09: qty 10, 5d supply, fill #0

## 2024-04-09 MED ORDER — BISACODYL 10 MG RE SUPP: 10.0000 mg | Freq: Once | RECTAL | Status: DC

## 2024-04-09 NOTE — Discharge Summary (Signed)
 Physician Discharge Summary  Katherine Hall ZOX:[id number] DOB: November 07, 1941 DOA: 04/06/2024  PCP: Jacquelyne Matte, DO  Admit date: 04/06/2024 Discharge date: 04/09/2024  Admitted From: Home  Discharge disposition: Home   Recommendations for Outpatient Follow-Up:   Follow up with your primary care provider in one week.  Check CBC, BMP, magnesium  in the next visit  Discharge Diagnosis:   Principal Problem:   Severe sepsis from pyelonephritis Active Problems:   AKI (acute kidney injury) (HCC)   Hypokalemia   Essential hypertension   Coronary artery disease involving native coronary artery of native heart with angina pectoris (HCC)   Abdominal aortic aneurysm (AAA) 3.0 cm to 5.0 cm in diameter in female [name])   Frequent PVCs   Peripheral arterial disease (HCC)   Hypothyroidism   Hyperlipidemia   Gastro-esophageal reflux disease without esophagitis   Acute pyelonephritis   Discharge Condition: Improved.  Diet recommendation: Low sodium, heart healthy.    Wound care: None.  Code status: Full.   History of Present Illness:   Katherine Hall is a 83 y.o. female with medical history significant of PAD, HTN, hypothyroidism, hx of gastric ulcer/bleed, CAD s/p CABG in 2017 presented to the hospital with back pain and confusion with dysuria and hematuria.  She also had some vomiting and decreased oral intake with chills and subjective fever.  In the ED, patient was hypotensive.  Labs showed leukocytosis with WBC at 18.9.  Potassium was low at 3.2 with elevated creatinine at 1.7.  Lactate was elevated at 2.5.  CT scan of the abdomen and pelvis done showed possible pyelonephritis.  Patient received  1L IVF, rocephin, morphine  and tylenol .  Blood cultures and urine cultures were obtained.  Patient was then considered for admission to hospital for further evaluation and treatment.    Hospital Course:   Following conditions were addressed during hospitalization as listed below,  Severe  sepsis from E. coli acute pyelonephritis E. coli bacteremia. Patient has clinically improved.  Received IV Rocephin during hospitalization.  Pansensitive so we will change to Keflex on discharge for next 5 days to complete total 7-day course.  Patient does not have fever costovertebral angle tenderness at this time.    AKI (acute kidney injury) secondary to sepsis and hypotension. Creatinine 0.7 today.  Received IV hydration.  AKI has resolved at this time.   Hypokalemia Proved after replacement.  Latest potassium of 4.3 prior to discharge.  Magnesium  was 1.8.   Essential hypertension Patient will resume valsartan ., Lopressor  and Imdur .    Coronary artery disease involving native coronary artery of native heart with angina pectoris  Hx of CABG x5 in 2017.  No issues at this time.    Abdominal aortic aneurysm (AAA) 3.0 cm to 5.0 cm in diameter in female  New finding on CT, measuring 4cm (since 2010), but followed by cardiology. Repeat CT or MR in 12 months and f/u with vascular surgery.   Frequent PVCs Continue amiodarone     Peripheral arterial disease  She is status post bilateral SFA endovascular intervention with drug-coated balloon angioplasty.    Hypothyroidism Continue Synthroid .   Hyperlipidemia Continue nexlizet  and repatha  . Statin intolerant    Gastro-esophageal reflux disease without esophagitis Continue PPI.  Disposition.  At this time, patient is stable for disposition home with outpatient PCP follow-up.  Medical Consultants:   None.  Procedures:    None Subjective:   Today, patient was seen and examined at bedside.  Continues to feel better.  Has some constipation today.  No overt pain.  No fever chills or rigor.  Discharge Exam:   Vitals:   04/09/24 0800 04/09/24 0826  BP:  (!) 151/78  Pulse:    Resp: 16 15  Temp:    SpO2: 98%    Vitals:   04/09/24 0400 04/09/24 0500 04/09/24 0800 04/09/24 0826  BP: (!) 145/66   (!) 151/78  Pulse: 70      Resp: 15 14 16 15   Temp:      TempSrc:    Oral  SpO2:   98%   Weight:      Height:      Body mass index is 25.57 kg/m.   General: Alert awake, not in obvious distress HENT: pupils equally reacting to light,  No scleral pallor or icterus noted. Oral mucosa is moist.  Chest:  Clear breath sounds.  No crackles or wheezes.  CVS: S1 &S2 heard. No murmur.  Regular rate and rhythm. Abdomen: Soft, nontender, nondistended.  Bowel sounds are heard.  No costovertebral angle tenderness. Extremities: No cyanosis, clubbing or edema.  Peripheral pulses are palpable. Psych: Alert, awake and oriented, normal mood CNS:  No cranial nerve deficits.  Power equal in all extremities.   Skin: Warm and dry.  No rashes noted.  The results of significant diagnostics from this hospitalization (including imaging, microbiology, ancillary and laboratory) are listed below for reference.     Diagnostic Studies:   CT ABDOMEN PELVIS WO CONTRAST Result Date: 04/06/2024 CLINICAL DATA:  Abdominal/flank pain, stone suspected EXAM: CT ABDOMEN AND PELVIS WITHOUT CONTRAST TECHNIQUE: Multidetector CT imaging of the abdomen and pelvis was performed following the standard protocol without IV contrast. RADIATION DOSE REDUCTION: This exam was performed according to the departmental dose-optimization program which includes automated exposure control, adjustment of the mA and/or kV according to patient size and/or use of iterative reconstruction technique. COMPARISON:  CT 05/27/2009. There is a report from CT 11/07/2019 however no images are available. FINDINGS: Lower chest: Respiratory motion obscures detail. Small left pleural effusion and associated airspace opacities. Hepatobiliary: Stable hepatic cyst. Distended gallbladder. No biliary dilation or radiopaque stone. Pancreas: Unremarkable. Spleen: Unremarkable. Adrenals/Urinary Tract: Normal adrenal glands. No urinary calculi or hydronephrosis. Unremarkable bladder. Stomach/Bowel:  Normal caliber large and small bowel. No bowel wall thickening. The appendix is not visualized. Small hiatal hernia. Vascular/Lymphatic: Aortic atherosclerotic calcification. 4.0 x 3.5 cm infrarenal abdominal aortic aneurysm is new since 2010. Reproductive: Unremarkable. Other: Nonspecific bilateral perinephric stranding with fluid extending inferiorly in the anterior pararenal space. No free intraperitoneal fluid or air. No abscess. Musculoskeletal: No acute fracture. Grade 1-2 anterolisthesis of L4. IMPRESSION: *Nonspecific bilateral perinephric stranding with fluid extending inferiorly in the anterior pararenal space. This could be due to pyelonephritis. Correlate with urinalysis. No urinary calculi or hydronephrosis. *Distended gallbladder. If there is right upper quadrant pain consider ultrasound for further evaluation. *Small left pleural effusion and associated airspace opacities. Atelectasis versus pneumonia. *4.0 cm infrarenal abdominal aortic aneurysm is new since 2010. Recommend follow-up CT or MR as appropriate in 12 months and referral to or continued care with vascular specialist. (Ref.: J Vasc Surg. 2018; 67:2-77 and J Am Coll Radiol 2013;10(10):789-794.) *Aortic Atherosclerosis (ICD10-I70.0). Electronically Signed   By: Rozell Cornet M.D.   On: 04/06/2024 02:11     Labs:   Basic Metabolic Panel: Recent Labs  Lab 04/06/24 0045 04/06/24 1635 04/07/24 0416 04/08/24 0406 04/09/24 0428  NA 133* 135 134* 131* 133*  K 3.2* 3.1* 3.9 3.2* 4.3  CL 93* 104 107  103 103  CO2 19* 18* 16* 20* 22  GLUCOSE 125* 106* 98 117* 113*  BUN 24* 19 19 15 13   CREATININE 1.76* 1.14* 1.07* 0.79 0.73  CALCIUM  9.4 8.2* 7.7* 7.8* 8.2*  MG  --  1.8  --  1.9 1.8   GFR Estimated Creatinine Clearance: 34 mL/min (by C-G formula based on SCr of 0.73 mg/dL). Liver Function Tests: Recent Labs  Lab 04/06/24 0045 04/07/24 0416  AST 53* 24  ALT 32 17  ALKPHOS 206* 126  BILITOT 0.6 0.7  PROT 6.5 5.0*   ALBUMIN  3.6 2.0*   No results for input(s): "LIPASE", "AMYLASE" in the last 168 hours. No results for input(s): "AMMONIA" in the last 168 hours. Coagulation profile No results for input(s): "INR", "PROTIME" in the last 168 hours.  CBC: Recent Labs  Lab 04/06/24 0045 04/07/24 0416 04/08/24 0406 04/09/24 0428  WBC 18.9* 17.0* 13.4* 10.6*  HGB 12.2 11.0* 10.0* 10.1*  HCT 36.2 33.1* 30.1* 29.5*  MCV 94.3 95.9 93.2 92.8  PLT 294 257 284 302   Cardiac Enzymes: No results for input(s): "CKTOTAL", "CKMB", "CKMBINDEX", "TROPONINI" in the last 168 hours. BNP: Invalid input(s): "POCBNP" CBG: No results for input(s): "GLUCAP" in the last 168 hours. D-Dimer No results for input(s): "DDIMER" in the last 72 hours. Hgb A1c No results for input(s): "HGBA1C" in the last 72 hours. Lipid Profile No results for input(s): "CHOL", "HDL", "LDLCALC", "TRIG", "CHOLHDL", "LDLDIRECT" in the last 72 hours. Thyroid  function studies No results for input(s): "TSH", "T4TOTAL", "T3FREE", "THYROIDAB" in the last 72 hours.  Invalid input(s): "FREET3" Anemia work up No results for input(s): "VITAMINB12", "FOLATE", "FERRITIN", "TIBC", "IRON ", "RETICCTPCT" in the last 72 hours. Microbiology Recent Results (from the past 240 hours)  Urine Culture     Status: Abnormal   Collection Time: 04/06/24 12:44 AM   Specimen: Urine, Clean Catch  Result Value Ref Range Status   Specimen Description   Final    URINE, CLEAN CATCH Performed at [facility], [address] [place], [place] [address]    Special Requests   Final    NONE Performed at [facility], [address] [place], [place] [address]    Culture >=100,000 COLONIES/mL ESCHERICHIA COLI (A)  Final   Report Status 04/08/2024 FINAL  Final   Organism ID, Bacteria ESCHERICHIA COLI (A)  Final      Susceptibility   Escherichia coli - MIC*    AMPICILLIN 8 SENSITIVE Sensitive     CEFAZOLIN <=4 SENSITIVE Sensitive     CEFEPIME  <=0.12 SENSITIVE Sensitive     CEFTRIAXONE <=0.25 SENSITIVE Sensitive     CIPROFLOXACIN <=0.25 SENSITIVE Sensitive     GENTAMICIN <=1 SENSITIVE Sensitive     IMIPENEM <=0.25 SENSITIVE Sensitive     NITROFURANTOIN <=16 SENSITIVE Sensitive     TRIMETH/SULFA <=20 SENSITIVE Sensitive     AMPICILLIN/SULBACTAM <=2 SENSITIVE Sensitive     PIP/TAZO <=4 SENSITIVE Sensitive ug/mL    * >=100,000 COLONIES/mL ESCHERICHIA COLI  Culture, blood (single)     Status: Abnormal (Preliminary result)   Collection Time: 04/06/24  2:00 AM   Specimen: BLOOD  Result Value Ref Range Status   Specimen Description   Final    BLOOD LEFT ANTECUBITAL Performed at [facility], [address] [place], [place] [address]    Special Requests   Final    BOTTLES DRAWN AEROBIC AND ANAEROBIC Blood Culture results may not be optimal due to  an inadequate volume of blood received in culture bottles Performed at [facility], [address] [place], [place] [address]    Culture  Setup Time   Final    GRAM NEGATIVE RODS ANAEROBIC BOTTLE ONLY Organism ID to follow CRITICAL RESULT CALLED TO, READ BACK BY AND VERIFIED WITHGenna Khan PHARMD, AT [phone] Cristela Donald Performed at [facility], [address], [place], [place] [address]    Culture ESCHERICHIA COLI (A)  Final   Report Status PENDING  Incomplete  Blood Culture ID Panel (Reflexed)     Status: Abnormal   Collection Time: 04/06/24  2:00 AM  Result Value Ref Range Status   Enterococcus faecalis NOT DETECTED NOT DETECTED Final   Enterococcus Faecium NOT DETECTED NOT DETECTED Final   Listeria monocytogenes NOT DETECTED NOT DETECTED Final   Staphylococcus species NOT DETECTED NOT DETECTED Final   Staphylococcus aureus (BCID) NOT DETECTED NOT DETECTED Final   Staphylococcus epidermidis NOT DETECTED NOT DETECTED Final   Staphylococcus lugdunensis NOT DETECTED NOT DETECTED Final   Streptococcus species NOT DETECTED NOT DETECTED Final    Streptococcus agalactiae NOT DETECTED NOT DETECTED Final   Streptococcus pneumoniae NOT DETECTED NOT DETECTED Final   Streptococcus pyogenes NOT DETECTED NOT DETECTED Final   A.calcoaceticus-baumannii NOT DETECTED NOT DETECTED Final   Bacteroides fragilis NOT DETECTED NOT DETECTED Final   Enterobacterales DETECTED (A) NOT DETECTED Final    Comment: Enterobacterales represent a large order of gram negative bacteria, not a single organism. CRITICAL RESULT CALLED TO, READ BACK BY AND VERIFIED WITH: E. SINCLAIR PHARMD, AT [phone] D. VANHOOK    Enterobacter cloacae complex NOT DETECTED NOT DETECTED Final   Escherichia coli DETECTED (A) NOT DETECTED Final    Comment: CRITICAL RESULT CALLED TO, READ BACK BY AND VERIFIED WITH: E. SINCLAIR PHARMD, AT [phone] D. VANHOOK    Klebsiella aerogenes NOT DETECTED NOT DETECTED Final   Klebsiella oxytoca NOT DETECTED NOT DETECTED Final   Klebsiella pneumoniae NOT DETECTED NOT DETECTED Final   Proteus species NOT DETECTED NOT DETECTED Final   Salmonella species NOT DETECTED NOT DETECTED Final   Serratia marcescens NOT DETECTED NOT DETECTED Final   Haemophilus influenzae NOT DETECTED NOT DETECTED Final   Neisseria meningitidis NOT DETECTED NOT DETECTED Final   Pseudomonas aeruginosa NOT DETECTED NOT DETECTED Final   Stenotrophomonas maltophilia NOT DETECTED NOT DETECTED Final   Candida albicans NOT DETECTED NOT DETECTED Final   Candida auris NOT DETECTED NOT DETECTED Final   Candida glabrata NOT DETECTED NOT DETECTED Final   Candida krusei NOT DETECTED NOT DETECTED Final   Candida parapsilosis NOT DETECTED NOT DETECTED Final   Candida tropicalis NOT DETECTED NOT DETECTED Final   Cryptococcus neoformans/gattii NOT DETECTED NOT DETECTED Final   CTX-M ESBL NOT DETECTED NOT DETECTED Final   Carbapenem resistance IMP NOT DETECTED NOT DETECTED Final   Carbapenem resistance KPC NOT DETECTED NOT DETECTED Final   Carbapenem resistance NDM NOT DETECTED  NOT DETECTED Final   Carbapenem resist OXA 48 LIKE NOT DETECTED NOT DETECTED Final   Carbapenem resistance VIM NOT DETECTED NOT DETECTED Final    Comment: Performed at [facility], [address] [place], [place] [address]  Culture, blood (routine x 2)     Status: None (Preliminary result)   Collection Time: 04/06/24  2:10 AM   Specimen: BLOOD LEFT ARM  Result Value Ref Range Status   Specimen Description   Final    BLOOD  LEFT ARM Performed at [facility], [address] [place], [place] [address]    Special Requests   Final    BOTTLES DRAWN AEROBIC AND ANAEROBIC Blood Culture results may not be optimal due to an inadequate volume of blood received in culture bottles Performed at [facility], [address] [place], [place] [address]    Culture   Final    NO GROWTH 3 DAYS Performed at [facility], [address] [place], [place] [address]    Report Status PENDING  Incomplete     Discharge Instructions:   Discharge Instructions     Call MD for:  persistant nausea and vomiting   Complete by: As directed    Call MD for:  severe uncontrolled pain   Complete by: As directed    Call MD for:  temperature >100.4   Complete by: As directed    Diet - low sodium heart healthy   Complete by: As directed    Discharge instructions   Complete by: As directed    Follow-up with your primary care provider in 1 week.  Complete course of antibiotic.  Increase fluid intake.  Check blood work in 1 week.  Seek medical attention for worsening symptoms.   Increase activity slowly   Complete by: As directed       Allergies as of 04/09/2024       Reactions   Penicillins Anaphylaxis   Did it involve swelling of the face/tongue/throat, SOB, or low BP? Yes Did it involve sudden or severe rash/hives, skin peeling, or any reaction on the inside of your mouth or nose? No Did you need to seek medical attention at a hospital or doctor's office? Yes When did it  last happen?      10+ years If all above answers are "NO", may proceed with cephalosporin use.   Lincomycin Swelling, Other (See Comments)   passed out   Naproxen Sodium Other (See Comments)   Stomach ulcers   Statins Other (See Comments)   Myalgias, Atorvastatin , Rosuvastatin    Latex Other (See Comments)   Blisters when in contact with skin   Tape Other (See Comments)   blistering        Medication List     STOP taking these medications    HYDROcodone  bit-homatropine 5-1.5 MG/5ML syrup Commonly known as: HYCODAN       TAKE these medications    acyclovir  400 MG tablet Commonly known as: ZOVIRAX  Take 400 mg by mouth daily as needed (for blister outbreaks per patient).   amiodarone  200 MG tablet Commonly known as: PACERONE  Take 1 tablet (200 mg total) by mouth daily.   cephALEXin 500 MG capsule Commonly known as: KEFLEX Take 1 capsule (500 mg total) by mouth 2 (two) times daily for 5 days. Start taking on: Apr 10, 2024   cilostazol  100 MG tablet Commonly known as: PLETAL  Take 1 tablet (100 mg total) by mouth 2 (two) times daily.   diphenhydramine -acetaminophen  25-500 MG Tabs tablet Commonly known as: TYLENOL  PM Take 2 tablets by mouth at bedtime.   EYE VITAMINS & MINERALS PO Take 1 tablet by mouth 2 (two) times daily.   fluticasone  50 MCG/ACT nasal spray Commonly known as: FLONASE  Place 1 spray into both nostrils daily as needed for allergies.   isosorbide  mononitrate 60 MG 24 hr tablet Commonly known as: IMDUR  TAKE 1 TABLET BY MOUTH EVERY DAY   levothyroxine  50 MCG tablet Commonly known as:  SYNTHROID  Take 50 mcg by mouth daily before breakfast.   LUBRICATING EYE DROPS OP Place 1 drop into both eyes daily as needed (dry eyes).   metoprolol  tartrate 25 MG tablet Commonly known as: LOPRESSOR  Take 25 mg by mouth daily at 12 noon.   Nexlizet  180-10 MG Tabs Generic drug: Bempedoic Acid-Ezetimibe  Take 1 tablet by mouth daily.   nitroGLYCERIN  0.4 MG  SL tablet Commonly known as: NITROSTAT  Dissolve 1 tablet under the tongue every 5 minutes as needed for chest pain. Max of 3 doses, then 911. What changed:  how much to take how to take this when to take this reasons to take this   pantoprazole  20 MG tablet Commonly known as: PROTONIX  Take 20 mg by mouth 2 (two) times daily.   polyethylene glycol powder 17 GM/SCOOP powder Commonly known as: GLYCOLAX/MIRALAX Take 17 g by mouth daily as needed for mild constipation or moderate constipation.   Repatha  SureClick 140 MG/ML Soaj Generic drug: Evolocumab  Inject 140 mg into the skin every 14 (fourteen) days.   rOPINIRole  2 MG tablet Commonly known as: REQUIP  Take 2 mg by mouth at bedtime.   traMADol  50 MG tablet Commonly known as: ULTRAM  Take 50 mg by mouth 2 (two) times daily.   valsartan  80 MG tablet Commonly known as: DIOVAN  TAKE 1 TABLET BY MOUTH EVERY DAY        Follow-up Information     Jacquelyne Matte, DO Follow up in 1 week(s).   Specialty: Family Medicine Contact information: [phone] [place] [address] [phone]                  Time coordinating discharge: 39 minutes  Signed:  Babs Dabbs  [facility] 04/09/2024, 10:27 AM

## 2024-04-09 NOTE — Progress Notes (Signed)
 Physical Therapy Treatment Patient Details Name: Katherine Hall MRN: [id number] DOB: [birth year] Today's Date: 04/09/2024   History of Present Illness 83 y.o. female who presented to ED 04/06/24 with complaints of back pain, concerns for sepsis, and confusion. Hypotension, pyelonephritis,  PMH: CAD s/p CABG, HTN, PVD s/p angioplasty, chronic anemia.    PT Comments  Pt resting in bed on arrival, pleasant and agreeable to therapy session, with continued progress toward acute goals. Pt needing up to minA with sit<>stand transfer to steady pt on rise with LOB on initial transfer, with pt able to self recover to sitting EOB. Pt demonstrated gait this session with rollator support and up to minA for steadying with R/L drift. Educated pt on time up out of bed importance with pt agreeable to sitting in chair at end of session. Pt also educated on continued mobility and continued rollator use after discharge. Pt continues to benefit from skilled PT services to progress toward functional mobility goals.     If plan is discharge home, recommend the following: A little help with walking and/or transfers;Assistance with cooking/housework;Help with stairs or ramp for entrance   Can travel by private vehicle        Equipment Recommendations  None recommended by PT    Recommendations for Other Services       Precautions / Restrictions Precautions Precautions: Fall Precaution/Restrictions Comments: keep MAP>65 Restrictions Weight Bearing Restrictions Per Provider Order: No     Mobility  Bed Mobility Overal bed mobility: Needs Assistance Bed Mobility: Supine to Sit     Supine to sit: Min assist, HOB elevated (+1 HHA)     General bed mobility comments: up to minA lift trunk to come EOB with HOB elevated, pt states she usually sleeps in recliner    Transfers Overall transfer level: Needs assistance Equipment used: Rollator (4 wheels) Transfers: Sit to/from Stand Sit to Stand: Contact guard  assist, From elevated surface, Min assist           General transfer comment: close CGA and up to minA to balance pt on rise, pt with x1 LOB on initial sit<>stand with pt able to self recover to sit on EOB, pt able to sit<>stand with no LOB and close CGA for saftey    Ambulation/Gait Ambulation/Gait assistance: Contact guard assist, Min assist Gait Distance (Feet): 51 Feet (+33ft with seated rest between) Assistive device: Rollator (4 wheels) Gait Pattern/deviations: Step-through pattern, Drifts right/left Gait velocity: decr     General Gait Details: pt drfiting R/L especially with head turns, needing up to minA to steady pt   Stairs             Wheelchair Mobility     Tilt Bed    Modified Rankin (Stroke Patients Only)       Balance Overall balance assessment: Mild deficits observed, not formally tested                                          Communication Communication Communication: Impaired Factors Affecting Communication: Hearing impaired  Cognition Arousal: Alert Behavior During Therapy: Flat affect   PT - Cognitive impairments: Problem solving, Safety/Judgement                       PT - Cognition Comments: pt unable to problem solve rollator stuck on foot of bed x2 without assistance Following commands: Impaired Following  commands impaired: Follows one step commands with increased time, Follows multi-step commands inconsistently    Cueing Cueing Techniques: Verbal cues  Exercises      General Comments General comments (skin integrity, edema, etc.): Pt MAP 100 at beginning of session and ~100 throughout session      Pertinent Vitals/Pain Pain Assessment Pain Assessment: No/denies pain    Home Living                          Prior Function            PT Goals (current goals can now be found in the care plan section) Acute Rehab PT Goals PT Goal Formulation: With patient Time For Goal Achievement:  04/22/24 Progress towards PT goals: Progressing toward goals    Frequency    Min 2X/week      PT Plan      Co-evaluation              AM-PAC PT "6 Clicks" Mobility   Outcome Measure  Help needed turning from your back to your side while in a flat bed without using bedrails?: None Help needed moving from lying on your back to sitting on the side of a flat bed without using bedrails?: A Little Help needed moving to and from a bed to a chair (including a wheelchair)?: A Little Help needed standing up from a chair using your arms (e.g., wheelchair or bedside chair)?: A Little Help needed to walk in hospital room?: A Little Help needed climbing 3-5 steps with a railing? : Total 6 Click Score: 17    End of Session Equipment Utilized During Treatment: Gait belt Activity Tolerance: Patient tolerated treatment well Patient left: in chair;with call bell/phone within reach;with chair alarm set Nurse Communication: Mobility status PT Visit Diagnosis: Unsteadiness on feet (R26.81);Muscle weakness (generalized) (M62.81)     Time: 4098-1191 PT Time Calculation (min) (ACUTE ONLY): 25 min  Charges:    $Gait Training: 8-22 mins $Therapeutic Activity: 8-22 mins PT General Charges $$ ACUTE PT VISIT: 1 Visit                     Forrest Iha 04/09/2024, 12:24 PM

## 2024-04-09 NOTE — Progress Notes (Signed)
   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 Family to transport home.

## 2024-04-10 LAB — CULTURE, BLOOD (SINGLE): Culture  Setup Time: NO GROWTH

## 2024-04-11 LAB — CULTURE, BLOOD (ROUTINE X 2): Culture: NO GROWTH

## 2024-04-24 ENCOUNTER — Ambulatory Visit (HOSPITAL_COMMUNITY)
Admission: RE | Admit: 2024-04-24 | Discharge: 2024-04-24 | Disposition: A | Discharge status: Home / Self Care | Source: Ambulatory Visit | Attending: Cardiovascular Disease | Admitting: Cardiovascular Disease

## 2024-04-24 DIAGNOSIS — I739 Peripheral vascular disease, unspecified: Secondary | ICD-10-CM | POA: Diagnosis present

## 2024-04-25 ENCOUNTER — Ambulatory Visit: Payer: Self-pay | Admitting: Cardiovascular Disease

## 2024-04-25 DIAGNOSIS — I739 Peripheral vascular disease, unspecified: Secondary | ICD-10-CM

## 2024-04-25 LAB — VAS US ABI WITH/WO TBI
Left ABI: 0.89
Right ABI: 0.59

## 2024-05-22 ENCOUNTER — Observation Stay (HOSPITAL_COMMUNITY)
Admission: EM | Admit: 2024-05-22 | Discharge: 2024-05-23 | Disposition: A | Discharge status: Home / Self Care | Attending: Cardiology | Admitting: Cardiology

## 2024-05-22 ENCOUNTER — Encounter (HOSPITAL_COMMUNITY): Payer: Self-pay

## 2024-05-22 ENCOUNTER — Other Ambulatory Visit: Payer: Self-pay

## 2024-05-22 ENCOUNTER — Encounter (HOSPITAL_COMMUNITY)
Admission: EM | Disposition: A | Discharge status: Home / Self Care | Payer: Self-pay | Source: Home / Self Care | Attending: Emergency Medicine

## 2024-05-22 ENCOUNTER — Emergency Department (HOSPITAL_COMMUNITY)

## 2024-05-22 DIAGNOSIS — I200 Unstable angina: Secondary | ICD-10-CM | POA: Diagnosis not present

## 2024-05-22 DIAGNOSIS — Z79899 Other long term (current) drug therapy: Secondary | ICD-10-CM | POA: Insufficient documentation

## 2024-05-22 DIAGNOSIS — K922 Gastrointestinal hemorrhage, unspecified: Secondary | ICD-10-CM | POA: Diagnosis not present

## 2024-05-22 DIAGNOSIS — I25110 Atherosclerotic heart disease of native coronary artery with unstable angina pectoris: Principal | ICD-10-CM

## 2024-05-22 DIAGNOSIS — Z7902 Long term (current) use of antithrombotics/antiplatelets: Secondary | ICD-10-CM | POA: Diagnosis not present

## 2024-05-22 DIAGNOSIS — E792 Myoadenylate deaminase deficiency: Secondary | ICD-10-CM | POA: Insufficient documentation

## 2024-05-22 DIAGNOSIS — Z951 Presence of aortocoronary bypass graft: Secondary | ICD-10-CM | POA: Insufficient documentation

## 2024-05-22 DIAGNOSIS — I739 Peripheral vascular disease, unspecified: Secondary | ICD-10-CM | POA: Insufficient documentation

## 2024-05-22 DIAGNOSIS — E039 Hypothyroidism, unspecified: Secondary | ICD-10-CM | POA: Diagnosis not present

## 2024-05-22 DIAGNOSIS — Z91040 Latex allergy status: Secondary | ICD-10-CM | POA: Insufficient documentation

## 2024-05-22 DIAGNOSIS — R079 Chest pain, unspecified: Secondary | ICD-10-CM | POA: Diagnosis present

## 2024-05-22 DIAGNOSIS — I10 Essential (primary) hypertension: Secondary | ICD-10-CM | POA: Insufficient documentation

## 2024-05-22 HISTORY — PX: LEFT HEART CATH AND CORS/GRAFTS ANGIOGRAPHY: CATH118250

## 2024-05-22 LAB — CBC
HCT: 37.9 % (ref 36.0–46.0)
HCT: 37.9 % (ref 36.0–46.0)
Hemoglobin: 12.5 g/dL (ref 12.0–15.0)
Hemoglobin: 12.6 g/dL (ref 12.0–15.0)
MCH: 32.1 pg (ref 26.0–34.0)
MCH: 32.1 pg (ref 26.0–34.0)
MCHC: 33 g/dL (ref 30.0–36.0)
MCHC: 33.2 g/dL (ref 30.0–36.0)
MCV: 97.2 fL (ref 80.0–100.0)
MCV: 96.7 fL (ref 80.0–100.0)
Platelets: 290 10*3/uL (ref 150–400)
Platelets: 210 10*3/uL (ref 150–400)
RBC: 3.9 MIL/uL (ref 3.87–5.11)
RBC: 3.92 MIL/uL (ref 3.87–5.11)
RDW: 13.8 % (ref 11.5–15.5)
RDW: 13.8 % (ref 11.5–15.5)
WBC: 16.1 10*3/uL — ABNORMAL HIGH (ref 4.0–10.5)
WBC: 13.1 10*3/uL — ABNORMAL HIGH (ref 4.0–10.5)
nRBC: 0 % (ref 0.0–0.2)
nRBC: 0 % (ref 0.0–0.2)

## 2024-05-22 LAB — BRAIN NATRIURETIC PEPTIDE: B Natriuretic Peptide: 179.6 pg/mL — ABNORMAL HIGH (ref 0.0–100.0)

## 2024-05-22 LAB — COMPREHENSIVE METABOLIC PANEL WITH GFR
ALT: 12 U/L (ref 0–44)
AST: 16 U/L (ref 15–41)
Albumin: 3.2 g/dL — ABNORMAL LOW (ref 3.5–5.0)
Alkaline Phosphatase: 50 U/L (ref 38–126)
Anion gap: 12 (ref 5–15)
BUN: 21 mg/dL (ref 8–23)
CO2: 23 mmol/L (ref 22–32)
Calcium: 9.5 mg/dL (ref 8.9–10.3)
Chloride: 100 mmol/L (ref 98–111)
Creatinine, Ser: 0.89 mg/dL (ref 0.44–1.00)
GFR, Estimated: >60 mL/min (ref >60)
Glucose, Bld: 104 mg/dL — ABNORMAL HIGH (ref 70–99)
Potassium: 3.2 mmol/L — ABNORMAL LOW (ref 3.5–5.1)
Sodium: 135 mmol/L (ref 135–145)
Total Bilirubin: 0.7 mg/dL (ref 0.0–1.2)
Total Protein: 6.2 g/dL — ABNORMAL LOW (ref 6.5–8.1)

## 2024-05-22 LAB — MAGNESIUM: Magnesium: 2.1 mg/dL (ref 1.7–2.4)

## 2024-05-22 LAB — TROPONIN I (HIGH SENSITIVITY)
Troponin I (High Sensitivity): 19 ng/L — ABNORMAL HIGH (ref <18)
Troponin I (High Sensitivity): 20 ng/L — ABNORMAL HIGH (ref <18)

## 2024-05-22 LAB — CREATININE, SERUM
Creatinine, Ser: 0.92 mg/dL (ref 0.44–1.00)
GFR, Estimated: >60 mL/min (ref >60)

## 2024-05-22 SURGERY — LEFT HEART CATH AND CORS/GRAFTS ANGIOGRAPHY
Anesthesia: LOCAL

## 2024-05-22 MED ORDER — HEPARIN (PORCINE) 25000 UT/250ML-% IV SOLN
550.0000 [IU]/h | INTRAVENOUS | Status: DC
  Filled 2024-05-22: qty 250

## 2024-05-22 MED ORDER — SODIUM CHLORIDE 0.9% FLUSH: 3.0000 mL | INTRAVENOUS | Status: DC | PRN

## 2024-05-22 MED ORDER — SODIUM CHLORIDE 0.9 % IV SOLN
INTRAVENOUS | Status: AC
Start: 2024-05-22 — End: 2024-05-22

## 2024-05-22 MED ORDER — ASPIRIN 300 MG RE SUPP
300.0000 mg | RECTAL | Status: DC
Start: 2024-05-22 — End: 2024-05-23

## 2024-05-22 MED ORDER — ONDANSETRON HCL 4 MG/2ML IJ SOLN: 4.0000 mg | Freq: Four times a day (QID) | INTRAMUSCULAR | Status: DC | PRN

## 2024-05-22 MED ORDER — LIDOCAINE HCL (PF) 1 % IJ SOLN
INTRAMUSCULAR | Status: DC | PRN
  Administered 2024-05-22: 12 mL
  Administered 2024-05-22: 2 mL

## 2024-05-22 MED ORDER — SODIUM CHLORIDE 0.9 % IV SOLN
INTRAVENOUS | Status: AC | PRN
  Administered 2024-05-22: 10 mL/h via INTRAVENOUS

## 2024-05-22 MED ORDER — ASPIRIN 81 MG PO CHEW: 324.0000 mg | CHEWABLE_TABLET | Freq: Once | ORAL | Status: DC

## 2024-05-22 MED ORDER — CLOPIDOGREL BISULFATE 75 MG PO TABS
ORAL_TABLET | ORAL | Status: AC
  Filled 2024-05-22: qty 1

## 2024-05-22 MED ORDER — SODIUM CHLORIDE 0.9 % IV SOLN: 250.0000 mL | INTRAVENOUS | Status: DC | PRN

## 2024-05-22 MED ORDER — HEPARIN SODIUM (PORCINE) 1000 UNIT/ML IJ SOLN
INTRAMUSCULAR | Status: AC
  Filled 2024-05-22: qty 10

## 2024-05-22 MED ORDER — HEPARIN BOLUS VIA INFUSION
2500.0000 [IU] | Freq: Once | INTRAVENOUS | Status: DC
  Filled 2024-05-22: qty 2500

## 2024-05-22 MED ORDER — MIDAZOLAM HCL 2 MG/2ML IJ SOLN
INTRAMUSCULAR | Status: AC
  Filled 2024-05-22: qty 2

## 2024-05-22 MED ORDER — CLOPIDOGREL BISULFATE 300 MG PO TABS
ORAL_TABLET | ORAL | Status: DC | PRN
  Administered 2024-05-22: 75 mg via ORAL

## 2024-05-22 MED ORDER — SODIUM CHLORIDE 0.9% FLUSH: 3.0000 mL | Freq: Two times a day (BID) | INTRAVENOUS | Status: DC

## 2024-05-22 MED ORDER — HEPARIN SODIUM (PORCINE) 5000 UNIT/ML IJ SOLN
5000.0000 [IU] | Freq: Three times a day (TID) | INTRAMUSCULAR | Status: DC
  Administered 2024-05-22 – 2024-05-23 (×2): 5000 [IU] via SUBCUTANEOUS
  Filled 2024-05-22 (×2): qty 1

## 2024-05-22 MED ORDER — HEPARIN (PORCINE) IN NACL 1000-0.9 UT/500ML-% IV SOLN
INTRAVENOUS | Status: DC | PRN
  Administered 2024-05-22 (×3): 500 mL

## 2024-05-22 MED ORDER — MIDAZOLAM HCL 2 MG/2ML IJ SOLN
INTRAMUSCULAR | Status: DC | PRN
  Administered 2024-05-22: 1 mg via INTRAVENOUS

## 2024-05-22 MED ORDER — ACETAMINOPHEN 325 MG PO TABS
650.0000 mg | ORAL_TABLET | ORAL | Status: DC | PRN
  Administered 2024-05-23 (×2): 650 mg via ORAL
  Filled 2024-05-22 (×2): qty 2

## 2024-05-22 MED ORDER — ASPIRIN 81 MG PO TBEC
81.0000 mg | DELAYED_RELEASE_TABLET | Freq: Every day | ORAL | Status: DC
  Administered 2024-05-23: 81 mg via ORAL
  Filled 2024-05-22: qty 1

## 2024-05-22 MED ORDER — NITROGLYCERIN 0.4 MG SL SUBL: 0.4000 mg | SUBLINGUAL_TABLET | SUBLINGUAL | Status: DC | PRN

## 2024-05-22 MED ORDER — VERAPAMIL HCL 2.5 MG/ML IV SOLN
INTRAVENOUS | Status: AC
  Filled 2024-05-22: qty 2

## 2024-05-22 MED ORDER — HYDRALAZINE HCL 20 MG/ML IJ SOLN: 10.0000 mg | INTRAMUSCULAR | Status: AC | PRN

## 2024-05-22 MED ORDER — LABETALOL HCL 5 MG/ML IV SOLN
INTRAVENOUS | Status: AC
  Filled 2024-05-22: qty 4

## 2024-05-22 MED ORDER — LABETALOL HCL 5 MG/ML IV SOLN
INTRAVENOUS | Status: DC | PRN
  Administered 2024-05-22: 10 mg via INTRAVENOUS

## 2024-05-22 MED ORDER — LABETALOL HCL 5 MG/ML IV SOLN: 10.0000 mg | INTRAVENOUS | Status: AC | PRN

## 2024-05-22 MED ORDER — FENTANYL CITRATE (PF) 100 MCG/2ML IJ SOLN
INTRAMUSCULAR | Status: DC | PRN
Start: 2024-05-22 — End: 2024-05-22
  Administered 2024-05-22: 25 ug via INTRAVENOUS

## 2024-05-22 MED ORDER — IOHEXOL 350 MG/ML SOLN
INTRAVENOUS | Status: DC | PRN
  Administered 2024-05-22: 85 mL

## 2024-05-22 MED ORDER — FENTANYL CITRATE (PF) 100 MCG/2ML IJ SOLN
INTRAMUSCULAR | Status: AC
Start: 2024-05-22 — End: 2024-05-22
  Filled 2024-05-22: qty 2

## 2024-05-22 MED ORDER — ASPIRIN 81 MG PO CHEW: 324.0000 mg | CHEWABLE_TABLET | ORAL | Status: DC

## 2024-05-22 SURGICAL SUPPLY — 17 items
CATH INFINITI 5 FR AR1 MOD (CATHETERS) IMPLANT
CATH INFINITI 5 FR IM (CATHETERS) IMPLANT
CATH INFINITI 5 FR LCB (CATHETERS) IMPLANT
CATH INFINITI 5FR AL1 (CATHETERS) IMPLANT
CATH INFINITI 5FR MULTPACK ANG (CATHETERS) IMPLANT
ELECT DEFIB PAD ADLT CADENCE (PAD) IMPLANT
GLIDESHEATH SLEND A-KIT 6F 22G (SHEATH) IMPLANT
GLIDESHEATH SLEND SS 6F .021 (SHEATH) IMPLANT
GUIDEWIRE INQWIRE 1.5J.035X260 (WIRE) IMPLANT
GUIDEWIRE TIGER .035X300 (WIRE) IMPLANT
KIT MICROPUNCTURE NIT STIFF (SHEATH) IMPLANT
PACK CARDIAC CATHETERIZATION (CUSTOM PROCEDURE TRAY) ×1 IMPLANT
SET ATX-X65L (MISCELLANEOUS) IMPLANT
SHEATH INTROD PINNACLE 5F 25CM (SHEATH) IMPLANT
SHEATH PINNACLE 5F 10CM (SHEATH) IMPLANT
SHEATH PROBE COVER 6X72 (BAG) IMPLANT
WIRE MICROINTRODUCER 60CM (WIRE) IMPLANT

## 2024-05-22 NOTE — Interval H&P Note (Signed)
 History and Physical Interval Note:  05/22/2024 4:11 PM  Katherine Hall Katherine Hall  has presented today for surgery, with the diagnosis of nstemi.  The various methods of treatment have been discussed with the patient and family. After consideration of risks, benefits and other options for treatment, the patient has consented to  Procedure(s): LEFT HEART CATH AND CORS/GRAFTS ANGIOGRAPHY (N/A) as a surgical intervention.  The patient's history has been reviewed, patient examined, no change in status, stable for surgery.  I have reviewed the patient's chart and labs.  Questions were answered to the patient's satisfaction.     Joesph Marcy J Joia Doyle

## 2024-05-22 NOTE — ED Provider Notes (Signed)
 [place] EMERGENCY DEPARTMENT AT [facility] Provider Note   CSN: [id number] Arrival date & time: 05/22/24  1238     Patient presents with: Chest Pain   Katherine Hall is a 83 y.o. female.   HPI Patient arrives via EMS.  She complains of chest pain, weakness.  Onset was a few days ago, with worsening symptoms over the past days in particular.  Pain is sternal, pressure-like.  EMS reports upon their arrival the patient was diaphoretic, with hypoxia, 87% room air.  Hypoxia improved with supplemental oxygen, pain has persisted in spite of nitro and aspirin .    Prior to Admission medications   Medication Sig Start Date End Date Taking? Authorizing Provider  acyclovir  (ZOVIRAX ) 400 MG tablet Take 400 mg by mouth daily as needed (for blister outbreaks per patient).    [provider]  amiodarone  (PACERONE ) 200 MG tablet Take 1 tablet (200 mg total) by mouth daily. 04/09/24   Pokhrel, Amador Bad, MD  Bempedoic Acid -Ezetimibe  (NEXLIZET ) 180-10 MG TABS Take 1 tablet by mouth daily. 05/19/23   Wenona Hamilton, MD  Carboxymethylcellul-Glycerin (LUBRICATING EYE DROPS OP) Place 1 drop into both eyes daily as needed (dry eyes).    [provider]  cilostazol  (PLETAL ) 100 MG tablet Take 1 tablet (100 mg total) by mouth 2 (two) times daily. 08/08/23   Nahser, Lela Purple, MD  diphenhydramine -acetaminophen  (TYLENOL  PM) 25-500 MG TABS tablet Take 2 tablets by mouth at bedtime.    [provider]  Evolocumab  (REPATHA  SURECLICK) 140 MG/ML SOAJ Inject 140 mg into the skin every 14 (fourteen) days. 01/22/24   Nahser, Lela Purple, MD  fluticasone  (FLONASE ) 50 MCG/ACT nasal spray Place 1 spray into both nostrils daily as needed for allergies. 02/22/18   [provider]  isosorbide  mononitrate (IMDUR ) 60 MG 24 hr tablet TAKE 1 TABLET BY MOUTH EVERY DAY 12/12/23   Wenona Hamilton, MD  levothyroxine  (SYNTHROID , LEVOTHROID) 50 MCG tablet Take 50 mcg by mouth daily before  breakfast. 03/31/16   [provider]  metoprolol  tartrate (LOPRESSOR ) 25 MG tablet Take 25 mg by mouth daily at 12 noon. 02/24/24   [provider]  Multiple Vitamins-Minerals (EYE VITAMINS & MINERALS PO) Take 1 tablet by mouth 2 (two) times daily.    [provider]  nitroGLYCERIN  (NITROSTAT ) 0.4 MG SL tablet Dissolve 1 tablet under the tongue every 5 minutes as needed for chest pain. Max of 3 doses, then 911. Patient taking differently: Place 0.4 mg under the tongue every 5 (five) minutes as needed for chest pain. Dissolve 1 tablet under the tongue every 5 minutes as needed for chest pain. Max of 3 doses, then 911. 10/24/22   Nahser, Lela Purple, MD  pantoprazole  (PROTONIX ) 20 MG tablet Take 20 mg by mouth 2 (two) times daily.    [provider]  polyethylene glycol powder (GLYCOLAX /MIRALAX ) 17 GM/SCOOP powder Take 17 g by mouth daily as needed for mild constipation or moderate constipation. 04/09/24   Pokhrel, Amador Bad, MD  rOPINIRole  (REQUIP ) 2 MG tablet Take 2 mg by mouth at bedtime.    [provider]  traMADol  (ULTRAM ) 50 MG tablet Take 50 mg by mouth 2 (two) times daily.    [provider]  valsartan  (DIOVAN ) 80 MG tablet TAKE 1 TABLET BY MOUTH EVERY DAY 07/13/23   Nahser, Lela Purple, MD    Allergies: Penicillins, Lincomycin, Naproxen sodium, Statins, Latex, and Tape    Review of Systems  Updated Vital Signs BP Aaron Aas)  141/79 (BP Location: Left Arm)   Pulse 82   Temp 98.9 F (37.2 C) (Oral)   Resp 16   Ht 4' 7 (1.397 m)   Wt 45.8 kg   SpO2 99%   BMI 23.47 kg/m   Physical Exam Vitals and nursing note reviewed.  Constitutional:      Appearance: She is well-developed. She is ill-appearing and diaphoretic.  HENT:     Head: Normocephalic and atraumatic.   Eyes:     Conjunctiva/sclera: Conjunctivae normal.    Cardiovascular:     Rate and Rhythm: Normal rate and regular rhythm.  Pulmonary:     Effort: Pulmonary effort is normal. No  respiratory distress.     Breath sounds: Normal breath sounds. No stridor.  Abdominal:     General: There is no distension.   Skin:    General: Skin is warm.   Neurological:     Mental Status: She is alert and oriented to person, place, and time.     Cranial Nerves: No cranial nerve deficit.   Psychiatric:        Mood and Affect: Mood normal.     (all labs ordered are listed, but only abnormal results are displayed) Labs Reviewed  CBC - Abnormal; Notable for the following components:      Result Value   WBC 16.1 (*)    All other components within normal limits  COMPREHENSIVE METABOLIC PANEL WITH GFR - Abnormal; Notable for the following components:   Potassium 3.2 (*)    Glucose, Bld 104 (*)    Total Protein 6.2 (*)    Albumin  3.2 (*)    All other components within normal limits  BRAIN NATRIURETIC PEPTIDE - Abnormal; Notable for the following components:   B Natriuretic Peptide 179.6 (*)    All other components within normal limits  TROPONIN I (HIGH SENSITIVITY) - Abnormal; Notable for the following components:   Troponin I (High Sensitivity) 19 (*)    All other components within normal limits  MAGNESIUM   CBG MONITORING, ED  TROPONIN I (HIGH SENSITIVITY)    EKG: EKG Interpretation Date/Time:  Wednesday May 22 2024 12:51:14 EDT Ventricular Rate:  83 PR Interval:  165 QRS Duration:  100 QT Interval:  451 QTC Calculation: 530 R Axis:   56  Text Interpretation: Sinus rhythm Probable left ventricular hypertrophy Inferior infarct, old Prolonged QT interval Confirmed by Dorenda Gandy [phone]) on 05/22/2024 2:18:02 PM  Radiology: DG Chest Portable 1 View Result Date: 05/22/2024 CLINICAL DATA:  chest pain EXAM: PORTABLE CHEST - 1 VIEW COMPARISON:  January 20, 2024 FINDINGS: Sternotomy wires and CABG changes. No focal airspace consolidation, pleural effusion, or pneumothorax. Hiatal hernia. No cardiomegaly. No acute fracture or destructive lesion. Multilevel thoracic  osteophytosis. IMPRESSION: No acute cardiopulmonary abnormality. Electronically Signed   By: Rance Burrows M.D.   On: 05/22/2024 15:08     Procedures   Medications Ordered in the ED  aspirin  chewable tablet 324 mg (324 mg Oral Not Given 05/22/24 1359)                                    Medical Decision Making Elderly female with heart history including CHF presents with weakness, fatigue, on exam is awake and alert, but diaphoretic, ill in appearance.  Broad differential including infection, ACS, heart failure exacerbation, cardiomyopathy. EMS rhythm strip nonischemic, rate 80s  Amount and/or Complexity of Data Reviewed Independent Historian:  EMS External Data Reviewed: notes. Labs: ordered. Decision-making details documented in ED Course. Radiology: ordered and independent interpretation performed. Decision-making details documented in ED Course. ECG/medicine tests: ordered and independent interpretation performed. Decision-making details documented in ED Course.  Risk OTC drugs.   3:20 PM Initial troponin 17, concern for unstable angina I discussed case with her cardiology colleagues, they will evaluate the patient.  Other initial studies notable for mild leukocytosis, but no evidence of pneumonia, and she is afebrile.     Final diagnoses:  Unstable angina [facility])     Dorenda Gandy, MD 05/22/24 1521

## 2024-05-22 NOTE — H&P (Addendum)
 Cardiology Admission History and Physical   Patient ID: Katherine Hall MRN: [id number]; DOB: Jul 30, 1941   Admission date: 05/22/2024  PCP:  Jacquelyne Matte, DO    [facility] Prov[facility] Cardiologist:  Ahmad Alert, MD        Chief Complaint:  Chest pain  Patient Profile:   Katherine Hall is a 83 y.o. female with CAD who is being seen 05/22/2024 for the evaluation of chest pain.  History of Present Illness:   Katherine Hall is a 83 y/o female w/hypertension, hyperlipidemia, CAD s/p CABG X5 (LIMA-LAD, SVG-OM1/OM3, SVG-diag, occluded SVG-RPDA), PAD s/p b/l SFA PTCA w/DCB, h/o GI bleed (last 2022)   Patient was last seen by Dr. Alvenia Aus in 02/2024.  She has been having nonresponsive stable anginal symptoms for a long time, for which she had been on Imdur , metoprolol , also has been on Ranexa  in the past.  Recently, she has had increasing chest pain across the chest, that lasts for about a minute or 2, occurs at rest.  On the way to the ER, EMS gave her nitroglycerin , which improved the pain.  At baseline, she lives with her husband, able to do her daily chores without much difficulty, but has not been able to do so lately, somewhat limited by chest pain.  Workup in the ER so far shows mildly elevated blood pressure, high sensitive troponin 19, BNP 179, EKG without any acute ischemic changes.  Patient had at least 1 episode of chest pain while in the ER, last about a minute or so, and relieved on its own.   Past Medical History:  Diagnosis Date   AKI (acute kidney injury) (HCC) 07/28/2022   Anemia    Arthritis    Coronary artery disease    s/p CABG in 2017 // Myoview  11/21: No ischemia or infarction, EF > 65, low risk  // [place]  >> cath 3/22: S-RPDA w severe disease - PCI unsuccessful & c/b peri-procedure MI (acute closure w wire manipulation>>inf ST elev) >> Med Rx    Echocardiogram 02/2021    EF 65-70, no RWMA, mild LVH, Gr 1 DD, normal RVSF, RVSP 19.5, mild AI, mild AV  sclerosis   Familial hypercholesterolemia    a. h/o intolerance of statins, prev on Repatha  but insurance stopped covering.   Gastric ulcer    a. h/o bleeding ulcer 2009 requiring 3 pints of blood.   History of removal of cyst    a. 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   HTN (hypertension)    Hypothyroidism    PAD (peripheral artery disease) (HCC)    S/p L SFA PTA // ABIs/LE arterial US  4/22: R 0.7; L 0.86 // R SFA 75-99; L SFA angioplasty site 30-49, distal 50-74 // ectatic distal aorta 2.6 x 2.6 cm   Prediabetes     Past Surgical History:  Procedure Laterality Date   ABDOMINAL AORTOGRAM W/LOWER EXTREMITY Bilateral 11/06/2019   Procedure: ABDOMINAL AORTOGRAM W/LOWER EXTREMITY;  Surgeon: Wenona Hamilton, MD;  Location: [facility];  Service: Cardiovascular;  Laterality: Bilateral;   ABDOMINAL AORTOGRAM W/LOWER EXTREMITY N/A 03/31/2021   Procedure: ABDOMINAL AORTOGRAM W/LOWER EXTREMITY;  Surgeon: Wenona Hamilton, MD;  Location: [facility];  Service: Cardiovascular;  Laterality: N/A;   ABDOMINAL AORTOGRAM W/LOWER EXTREMITY N/A 09/15/2021   Procedure: ABDOMINAL AORTOGRAM W/LOWER EXTREMITY;  Surgeon: Wenona Hamilton, MD;  Location: [facility];  Service: Cardiovascular;  Laterality: N/A;   ABDOMINAL HYSTERECTOMY  APPENDECTOMY     CARDIAC CATHETERIZATION N/A 07/05/2016   Procedure: Left Heart Cath and Coronary Angiography;  Surgeon: Odie Benne, MD;  Location: [facility] INVASIVE CV [facility];  Service: Cardiovascular;  Laterality: N/A;   CARDIAC SURGERY     cyst, not on heart.   CORONARY ARTERY BYPASS GRAFT N/A 07/07/2016   Procedure: CORONARY ARTERY BYPASS GRAFTING (CABG) x 5 with endoscopic harvesting of the right greater saphenous vein;  Surgeon: Bartley Lightning, MD;  Location: [facility] OR;  Service: Open Heart Surgery;  Laterality: N/A;   CORONARY BALLOON ANGIOPLASTY N/A 02/03/2021   Procedure: CORONARY BALLOON ANGIOPLASTY;  Surgeon: Wenona Hamilton, MD;  Location: [facility];  Service: Cardiovascular;  Laterality: N/A;  svg to pda   ESOPHAGOGASTRODUODENOSCOPY (EGD) WITH PROPOFOL  N/A 05/08/2021   Procedure: ESOPHAGOGASTRODUODENOSCOPY (EGD) WITH PROPOFOL ;  Surgeon: Evangeline Hilts, MD;  Location: [facility];  Service: Endoscopy;  Laterality: N/A;   HEMORRHOID SURGERY     INTRAOPERATIVE TRANSESOPHAGEAL ECHOCARDIOGRAM N/A 07/07/2016   Procedure: INTRAOPERATIVE TRANSESOPHAGEAL ECHOCARDIOGRAM;  Surgeon: Bartley Lightning, MD;  Location: [facility];  Service: Open Heart Surgery;  Laterality: N/A;   LEFT HEART CATH AND CORS/GRAFTS ANGIOGRAPHY N/A 02/03/2021   Procedure: LEFT HEART CATH AND CORS/GRAFTS ANGIOGRAPHY;  Surgeon: Wenona Hamilton, MD;  [facility]: [facility];  Service: Cardiovascular;  Laterality: N/A;   LEFT HEART CATH AND CORS/GRAFTS ANGIOGRAPHY N/A 07/18/2022   Procedure: LEFT HEART CATH AND CORS/GRAFTS ANGIOGRAPHY;  Surgeon: [place], Peter M, MD;  [facility] INVAS[facility] CV LAB;  Service: Cardiovascular;  Laterality: N/A;   PERIPHERAL VASCULAR ATHERECTOMY Left 09/15/2021   Procedure: PERIPHERAL VASCULAR ATHERECTOMY;  Surgeon: Wenona Hamilton, MD;  [facility]: [facility];  Service: Cardiovascular;  Laterality: Left;   PERIPHERAL VASCULAR BALLOON ANGIOPLASTY  11/06/2019   Procedure: PERIPHERAL VASCULAR BALLOON ANGIOPLASTY;  Surgeon: Wenona Hamilton, MD;  [facility]: [facility];  Service: Cardiovascular;;  cutting and dcb   PERIPHERAL VASCULAR BALLOON ANGIOPLASTY Right 03/31/2021   Procedure: PERIPHERAL VASCULAR BALLOON ANGIOPLASTY;  Surgeon: Wenona Hamilton, MD;  Location: [facility];  Service: Cardiovascular;  Laterality: Right;  SFA   SHOULDER SURGERY       Medications Prior to Admission: Prior to Admission medications   Medication Sig Start Date End Date Taking? Authorizing Provider  acyclovir  (ZOVIRAX ) 400 MG tablet Take 400 mg by mouth daily as needed (for blister outbreaks).   Yes [provider]  amiodarone  (PACERONE ) 200 MG tablet Take 1 tablet (200 mg total) by mouth daily. 04/09/24  Yes Pokhrel, Laxman, MD  cilostazol  (PLETAL ) 100 MG tablet Take 1 tablet (100 mg total) by mouth 2 (two) times daily. Patient taking differently: Take 100 mg by mouth in the morning and at bedtime. 08/08/23  Yes Nahser, Lela Purple, MD  clopidogrel  (PLAVIX ) 75 MG tablet Take 75 mg by mouth daily.   Yes [provider]  ezetimibe  (ZETIA ) 10 MG tablet Take 10 mg by mouth daily.   Yes [provider]  fluticasone  (FLONASE ) 50 MCG/ACT nasal spray Place 1 spray into both nostrils daily as needed for allergies or rhinitis. 02/22/18  Yes [provider]  isosorbide  mononitrate (IMDUR ) 60 MG 24 hr tablet TAKE 1 TABLET BY MOUTH EVERY DAY 12/12/23  Yes Wenona Hamilton, MD  levothyroxine  (SYNTHROID , LEVOTHROID) 50 MCG tablet Take 50 mcg by mouth daily before breakfast. 03/31/16  Yes [provider]  metoprolol  tartrate (LOPRESSOR ) 25 MG tablet Take 25 mg by  mouth in the morning and at bedtime. 02/24/24  Yes [provider]  pantoprazole  (PROTONIX ) 20 MG tablet Take 20 mg by mouth 2 (two) times daily before a meal.   Yes [provider]  polyethylene glycol powder (GLYCOLAX /MIRALAX ) 17 GM/SCOOP powder Take 17 g by mouth daily as needed for mild constipation or moderate constipation. 04/09/24  Yes Pokhrel, Laxman, MD  rOPINIRole  (REQUIP ) 2 MG tablet Take 2 mg by mouth at bedtime.   Yes [provider]  valsartan  (DIOVAN ) 80 MG tablet TAKE 1 TABLET BY MOUTH EVERY DAY 07/13/23  Yes Nahser, Lela Purple, MD  Bempedoic Acid -Ezetimibe  (NEXLIZET ) 180-10 MG TABS Take 1 tablet by mouth daily. 05/19/23   Wenona Hamilton, MD  diphenhydramine -acetaminophen  (TYLENOL  PM) 25-500 MG TABS tablet Take 2 tablets by mouth at bedtime.    [provider]  Evolocumab  (REPATHA  SURECLICK) 140 MG/ML SOAJ Inject 140 mg into the skin every 14 (fourteen) days. 01/22/24   Nahser,  Lela Purple, MD  Multiple Vitamins-Minerals (EYE VITAMINS & MINERALS PO) Take 1 tablet by mouth 2 (two) times daily.    [provider]  nitroGLYCERIN  (NITROSTAT ) 0.4 MG SL tablet Dissolve 1 tablet under the tongue every 5 minutes as needed for chest pain. Max of 3 doses, then 911. Patient taking differently: Place 0.4 mg under the tongue every 5 (five) minutes as needed for chest pain. Dissolve 1 tablet under the tongue every 5 minutes as needed for chest pain. Max of 3 doses, then 911. 10/24/22   Nahser, Lela Purple, MD  traMADol  (ULTRAM ) 50 MG tablet Take 50 mg by mouth 2 (two) times daily.    [provider]     Allergies:    Allergies  Allergen Reactions   Penicillins Anaphylaxis    Did it involve swelling of the face/tongue/throat, SOB, or low BP? Yes Did it involve sudden or severe rash/hives, skin peeling, or any reaction on the inside of your mouth or nose? No Did you need to seek medical attention at a hospital or doctor's office? Yes When did it last happen?      10+ years If all above answers are "NO", may proceed with cephalosporin use.     Lincomycin Swelling and Other (See Comments)    Passed out, also   Naproxen Sodium Other (See Comments)    Stomach ulcers   Statins Other (See Comments)    Myalgias = Atorvastatin , Rosuvastatin    Latex Other (See Comments)    Blisters when in contact with skin   Tape Other (See Comments)    Blistering     Social History:   Social History   Socioeconomic History   Marital status: Married    Spouse name: Not on file   Number of children: Not on file   Years of education: Not on file   Highest education level: Not on file  Occupational History   Not on file  Tobacco Use   Smoking status: Never   Smokeless tobacco: Never  Vaping Use   Vaping status: Never Used  Substance and Sexual Activity   Alcohol use: No    Comment: social   Drug use: No   Sexual activity: Not on file  Other Topics Concern   Not on file   Social History Narrative   Not on file   Social Drivers of Health   Financial Resource Strain: Low Risk  (12/20/2023)   Received from [facility]   Overall Financial Resource Strain (CARDIA)    Difficulty  of Paying Living Expenses: Not very hard  Food Insecurity: Patient Declined (04/07/2024)   Hunger Vital Sign    Worried About Running Out of Food in the Last Year: Patient declined    Ran Out of Food in the Last Year: Patient declined  Transportation Needs: Patient Declined (04/07/2024)   PRAPARE - Administrator, Civil Service (Medical): Patient declined    Lack of Transportation (Non-Medical): Patient declined  Physical Activity: Unknown (10/08/2023)   Received from [facility]   Exercise Vital Sign    On average, how many days per week do you engage in moderate to strenuous exercise (like a brisk walk)?: 0 days    Minutes of Exercise per Session: Not on file  Stress: No Stress Concern Present (10/08/2023)   Received from [facility] of Occupational Health - Occupational Stress Questionnaire    Feeling of Stress : Only a little  Social Connections: Patient Declined (04/07/2024)   Social Connection and Isolation Panel    Frequency of Communication with Friends and Family: Patient declined    Frequency of Social Gatherings with Friends and Family: Patient declined    Attends Religious Services: Patient declined    Database administrator or Organizations: Patient declined    Attends Banker Meetings: Patient declined    Marital Status: Patient declined  Intimate Partner Violence: Not At Risk (04/07/2024)   Humiliation, Afraid, Rape, and Kick questionnaire    Fear of Current or Ex-Partner: No    Emotionally Abused: No    Physically Abused: No    Sexually Abused: No    Family History:   The patient's family history includes CAD in her father; Coronary artery disease in her brother and sister; Stroke in her mother.    ROS:  Please see  the history of present illness.  All other ROS reviewed and negative.     Physical Exam/Data:   Vitals:   05/22/24 1253 05/22/24 1303 05/22/24 1304  BP: (!) 141/79    Pulse: 82    Resp: 16    Temp: 98.9 F (37.2 C)    TempSrc: Oral    SpO2: 98% 99%   Weight:   45.8 kg  Height:   4' 7 (1.397 m)   No intake or output data in the 24 hours ending 05/22/24 1608    05/22/2024    1:04 PM 04/06/2024   12:38 AM 02/20/2024   11:27 AM  Last 3 Weights  Weight (lbs) 101 lb 110 lb 114 lb  Weight (kg) 45.813 kg 49.896 kg 51.71 kg     Body mass index is 23.47 kg/m.   Physical Exam Vitals and nursing note reviewed.  Constitutional:      General: She is not in acute distress. Neck:     Vascular: No JVD.   Cardiovascular:     Rate and Rhythm: Normal rate and regular rhythm.     Heart sounds: Normal heart sounds. No murmur heard. Pulmonary:     Effort: Pulmonary effort is normal.     Breath sounds: Normal breath sounds. No wheezing or rales.   Musculoskeletal:     Right lower leg: No edema.     Left lower leg: No edema.      EKG:  The EKG on 05/23/2024 was personally reviewed and demonstrates:   Sinus rhythm, Probable LVH Inferior infarct, old Prolonged Qtc interval   Relevant CV Studies: Coronary and bypass graft angiography 07/2022:  3 vessel obstructive  disease and left main disease.  Patent LIMA to the LAD Patent SVG to the first diagonal Patent sequential SVG to OM1 and OM2 Occluded SVG to the PDA Low LV EDP   Plan: the SVG to PDA is known to be occluded. Otherwise no new findings compared with cardiac cath in March 2022. The other grafts are patent. The RCA has diffuse moderate to severe disease and is extremely tortuous. It is not a viable target for PCI. Would continue medical therapy.   Echocardiogram 2022:  1. Left ventricular ejection fraction, by estimation, is 65 to 70%. The  left ventricle has hyperdynamic function. The left ventricle has no  regional wall  motion abnormalities. There is mild left ventricular  hypertrophy. Left ventricular diastolic  parameters are consistent with Grade I diastolic dysfunction (impaired  relaxation).   2. Right ventricular systolic function is normal. The right ventricular  size is normal. There is normal pulmonary artery systolic pressure. The  estimated right ventricular systolic pressure is 19.5 mmHg.   3. The mitral valve is normal in structure. No evidence of mitral valve  regurgitation. No evidence of mitral stenosis.   4. The aortic valve is tricuspid. Aortic valve regurgitation is mild.  Mild aortic valve sclerosis is present, with no evidence of aortic valve  stenosis.   5. The inferior vena cava is normal in size with greater than 50%  respiratory variability, suggesting right atrial pressure of 3 mmHg.   Laboratory Data:  High Sensitivity Troponin:   Recent Labs  Lab 05/22/24 1306  TROPONINIHS 19*      Chemistry Recent Labs  Lab 05/22/24 1306  NA 135  K 3.2*  CL 100  CO2 23  GLUCOSE 104*  BUN 21  CREATININE 0.89  CALCIUM  9.5  MG 2.1  GFRNONAA >60  ANIONGAP 12    Recent Labs  Lab 05/22/24 1306  PROT 6.2*  ALBUMIN  3.2*  AST 16  ALT 12  ALKPHOS 50  BILITOT 0.7   Lipids No results for input(s): CHOL, TRIG, HDL, LABVLDL, LDLCALC, CHOLHDL in the last 168 hours. Hematology Recent Labs  Lab 05/22/24 1306  WBC 16.1*  RBC 3.90  HGB 12.5  HCT 37.9  MCV 97.2  MCH 32.1  MCHC 33.0  RDW 13.8  PLT 290   Thyroid  No results for input(s): TSH, FREET4 in the last 168 hours. BNP Recent Labs  Lab 05/22/24 1307  BNP 179.6*    DDimer No results for input(s): DDIMER in the last 168 hours.   Radiology/Studies:  DG Chest Portable 1 View Result Date: 05/22/2024 CLINICAL DATA:  chest pain EXAM: PORTABLE CHEST - 1 VIEW COMPARISON:  January 20, 2024 FINDINGS: Sternotomy wires and CABG changes. No focal airspace consolidation, pleural effusion, or pneumothorax.  Hiatal hernia. No cardiomegaly. No acute fracture or destructive lesion. Multilevel thoracic osteophytosis. IMPRESSION: No acute cardiopulmonary abnormality. Electronically Signed   By: Rance Burrows M.D.   On: 05/22/2024 15:08     Assessment and Plan:   83 y/o female w/hypertension, hyperlipidemia, CAD s/p CABG X5 (LIMA-LAD, SVG-OM1/OM3, SVG-diag, occluded SVG-RPDA), PAD s/p b/l SFA PTCA w/DCB, h/o GI bleed (last 2022)   Chest pain: Baseline stable angina, with recent increase in frequency and severity. Episodes of nitrate responsive chest pain at rest. Troponin mildly elevated 19, BNP mildly elevated 179 without any obvious clinical signs/symptoms of heart failure. Extensive CAD, PAD, with PET/CT stress test did not 2023, followed by heart catheterization that showed unprotected RCA with no good target for intervention.  It is possible that her pain is emanating from ischemic RCA territory, but also need to exclude any new stenosis in any of the other grafts. Recommend coronary, and bypass graft angiography, and possible intervention. Patient is DNR at baseline.  I discussed with the patient, and she has agreed to rescind her DNR for the.  After the procedure.  She will go back to being DNR after the procedure. Recommend aspirin , IV heparin  for now. Further recommendations after heart catheterization. If no target for revascularization identified, I would recommend uptitration of her baseline antianginal therapy, including Imdur , metoprolol , Ranexa . It appears the metoprolol  was stopped in the past due to bradycardia, but is back on her list.  Resting heart rate is in 80s, so should be able to tolerate metoprolol .  I do not see Ranexa  on her recent medication list, will ensure that she gets back on it after the procedure, in case there is no revascularization options.  Hypertension: Blood pressure elevated today.  Will monitor during this hospitalization.  She is currently on metoprolol  and  isosorbide .  Will consider adding amlodipine  or losartan as antianginal agent  PAD: Severe claudication, no CLI. Continue medical management.  Mixed hyperlipidemia: Continue Nexlizet , Repatha . Check lipid panel.  PVC: She is currently on amiodarone  for PVC.  I will defer to outpatient cardiology follow-up whether or not he should continue long-term amiodarone  or manage with beta-blocker alone  Risk Assessment/Risk Scores:    TIMI Risk Score for Unstable Angina or Non-ST Elevation MI:   The patient's TIMI risk score is 5, which indicates a 26% risk of all cause mortality, new or recurrent myocardial infarction or need for urgent revascularization in the next 14 days.     Code Status: Patient is DNR at baseline.  I discussed with the patient, and she has agreed to rescind her DNR for the.  After the procedure.  She will go back to being DNR after the procedure.  Severity of Illness: The appropriate patient status for this patient is INPATIENT. Inpatient status is judged to be reasonable and necessary in order to provide the required intensity of service to ensure the patient's safety. The patient's presenting symptoms, physical exam findings, and initial radiographic and laboratory data in the context of their chronic comorbidities is felt to place them at high risk for further clinical deterioration. Furthermore, it is not anticipated that the patient will be medically stable for discharge from the hospital within 2 midnights of admission.   * I certify that at the point of admission it is my clinical judgment that the patient will require inpatient hospital care spanning beyond 2 midnights from the point of admission due to high intensity of service, high risk for further deterioration and high frequency of surveillance required.*   For questions or updates, please contact [facility] Please consult [email or web address] for contact info under     Signed, Cody Das,  MD 05/22/2024 4:08 PM

## 2024-05-22 NOTE — ED Triage Notes (Signed)
 Patient bib EMS from home with complaints of chest pain that radiates through her arm. EMS reports PVCs when patient complains of chest pain. Patient is lethargic with ems and original oxygen was 85% on room air. EMS gave 1 nitroglycerin  and 324 of aspirin .   Pt has cardiac history: CHF, stents and possible bypass.

## 2024-05-22 NOTE — ED Notes (Signed)
Got patient into a gown on the monitor did EKG shown to Dr Lockwood patient is resting with call bell in reach  

## 2024-05-22 NOTE — ED Notes (Signed)
 Report given to cath team at bedside, patient alert, oriented and calm on departure for cath lab. Patient had even, calm respirations and did not appear in distress.

## 2024-05-22 NOTE — Progress Notes (Signed)
 PHARMACY - ANTICOAGULATION CONSULT NOTE  Pharmacy Consult for heparin  Indication: chest pain/ACS  Allergies  Allergen Reactions   Penicillins Anaphylaxis    Did it involve swelling of the face/tongue/throat, SOB, or low BP? Yes Did it involve sudden or severe rash/hives, skin peeling, or any reaction on the inside of your mouth or nose? No Did you need to seek medical attention at a hospital or doctor's office? Yes When did it last happen?      10+ years If all above answers are "NO", may proceed with cephalosporin use.     Lincomycin Swelling and Other (See Comments)    Passed out, also   Naproxen Sodium Other (See Comments)    Stomach ulcers   Statins Other (See Comments)    Myalgias = Atorvastatin , Rosuvastatin    Latex Other (See Comments)    Blisters when in contact with skin   Tape Other (See Comments)    Blistering     Patient Measurements: Height: 4' 7 (139.7 cm) Weight: 45.8 kg (101 lb) IBW/kg (Calculated) : 34 HEPARIN  DW (KG): 43.5  Vital Signs: Temp: 98.9 F (37.2 C) (06/18 1253) Temp Source: Oral (06/18 1253) BP: 141/79 (06/18 1253) Pulse Rate: 82 (06/18 1253)  Labs: Recent Labs    05/22/24 1306  HGB 12.5  HCT 37.9  PLT 290  CREATININE 0.89  TROPONINIHS 19*    Estimated Creatinine Clearance: 29.3 mL/min (by C-G formula based on SCr of 0.89 mg/dL).   Medical History: Past Medical History:  Diagnosis Date   AKI (acute kidney injury) (HCC) 07/28/2022   Anemia    Arthritis    Coronary artery disease    s/p CABG in 2017 // Myoview  11/21: No ischemia or infarction, EF > 65, low risk  // [place]  >> cath 3/22: S-RPDA w severe disease - PCI unsuccessful & c/b peri-procedure MI (acute closure w wire manipulation>>inf ST elev) >> Med Rx    Echocardiogram 02/2021    EF 65-70, no RWMA, mild LVH, Gr 1 DD, normal RVSF, RVSP 19.5, mild AI, mild AV sclerosis   Familial hypercholesterolemia    a. h/o intolerance of statins, prev on Repatha  but insurance stopped  covering.   Gastric ulcer    a. h/o bleeding ulcer 2009 requiring 3 pints of blood.   History of removal of cyst    a. 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   HTN (hypertension)    Hypothyroidism    PAD (peripheral artery disease) (HCC)    S/p L SFA PTA // ABIs/LE arterial US  4/22: R 0.7; L 0.86 // R SFA 75-99; L SFA angioplasty site 30-49, distal 50-74 // ectatic distal aorta 2.6 x 2.6 cm   Prediabetes     Medications:  Scheduled:   aspirin   324 mg Oral NOW   Or   aspirin   300 mg Rectal NOW   [START ON 05/23/2024] aspirin  EC  81 mg Oral Daily   Infusions:   Assessment: 83 yo female presenting with chest pain. Trop 19. Not on anticoagulation PTA. Hgb 12.5, plt wnl. Pharmacy has been consulted to dose heparin  in the setting of ACS.   Goal of Therapy:  Heparin  level 0.3-0.7 units/ml Monitor platelets by anticoagulation protocol: Yes   Plan:  Heparin  2500 units x 1 bolus  Then, start heparin  550 units/hr  8 hour heparin  level  Daily CBC and heparin  level  Monitor for s/sx of bleeding  Modene Andy I Arcadio Cope 05/22/2024,4:04 PM

## 2024-05-23 ENCOUNTER — Other Ambulatory Visit (HOSPITAL_COMMUNITY): Payer: Self-pay

## 2024-05-23 ENCOUNTER — Inpatient Hospital Stay (HOSPITAL_COMMUNITY)

## 2024-05-23 ENCOUNTER — Encounter (HOSPITAL_COMMUNITY): Payer: Self-pay | Admitting: Cardiology

## 2024-05-23 DIAGNOSIS — I10 Essential (primary) hypertension: Secondary | ICD-10-CM

## 2024-05-23 DIAGNOSIS — I739 Peripheral vascular disease, unspecified: Secondary | ICD-10-CM | POA: Diagnosis not present

## 2024-05-23 DIAGNOSIS — E782 Mixed hyperlipidemia: Secondary | ICD-10-CM

## 2024-05-23 DIAGNOSIS — Z951 Presence of aortocoronary bypass graft: Secondary | ICD-10-CM

## 2024-05-23 DIAGNOSIS — I200 Unstable angina: Secondary | ICD-10-CM | POA: Diagnosis not present

## 2024-05-23 DIAGNOSIS — R079 Chest pain, unspecified: Secondary | ICD-10-CM

## 2024-05-23 LAB — ECHOCARDIOGRAM COMPLETE
Area-P 1/2: 3.89 cm2
Height: 55 in
S' Lateral: 2.6 cm
Weight: 1616 [oz_av]

## 2024-05-23 MED ORDER — RANOLAZINE ER 500 MG PO TB12
500.0000 mg | ORAL_TABLET | Freq: Two times a day (BID) | ORAL | 0 refills | Status: DC
  Filled 2024-05-23: qty 180, 90d supply, fill #0

## 2024-05-23 MED FILL — Verapamil HCl IV Soln 2.5 MG/ML: INTRAVENOUS | Qty: 2 | Status: AC

## 2024-05-23 NOTE — Plan of Care (Signed)
  Problem: Education: Goal: Understanding of cardiac disease, CV risk reduction, and recovery process will improve Outcome: Progressing   Problem: Activity: Goal: Ability to tolerate increased activity will improve Outcome: Progressing   Problem: Cardiac: Goal: Ability to achieve and maintain adequate cardiovascular perfusion will improve Outcome: Progressing   Problem: Health Behavior/Discharge Planning: Goal: Ability to safely manage health-related needs after discharge will improve Outcome: Progressing   Problem: Education: Goal: Knowledge of General Education information will improve Description: Including pain rating scale, medication(s)/side effects and non-pharmacologic comfort measures Outcome: Progressing

## 2024-05-23 NOTE — Progress Notes (Signed)
 Discharge complete. Patient waiting on husband to bring clothes to get dressed and go home.

## 2024-05-23 NOTE — Progress Notes (Signed)
  Echocardiogram 2D Echocardiogram has been performed.  Fain Home RDCS 05/23/2024, 11:04 AM

## 2024-05-23 NOTE — Discharge Summary (Signed)
 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 Denies anginal chest pain. Antianginal therapies include metoprolol , Imdur , sublingual nitroglycerin  tablets Will add Ranexa  500 mg p.o. twice daily, prescription sent to [facility] pharmacy Heart catheterization results reviewed with the patient in detail during morning rounds. Echocardiogram is pending  PAD. Currently denies claudication.  On cilostazol  and Plavix   History of GI bleed.  Hypertension: Blood pressures are stable.  Resume home medications  Hyperlipidemia.  Currently on Repatha  and Nexlizet   Diagnostic Studies/Procedures   Coronary and bypass graft angiography 05/22/2024: LM: Severe diffuse disease LAD: Proximal occlusion, bypassed by patent LIMA-LAD graft          Diagonal vessel bypassed by patent SVG-diag graft with moderate disease at anastomosis, unchanged since 2023 Lcx: Proximal occlusion of LCx/OM1, bypassed by patent SVG-OM1/OM 3 sequential graft         Diffuse mid circumflex disease after graft touchdown RCA: Tortuous vessel with severe diffuse disease, followed by mid occlusion (occlusion is new since previous cath in 2023)           SVG-RPDA graft is known occluded, not engaged today   LVEDP 11 mmHg      No acute culprit lesion identified Severe native vessel disease, progressed since 2023-notably with occlusion of mid RCA Occluded SVG-PDA graft, patent all other grafts without significant stenosis   I suspect her angina is due to territory previously supplied by RCA and SVG-RPDA Continue medical management, with uptitration of antianginal medications as tolerated Okay to  discharge the patient on 6/19 AM   Cody Das, MD  Echo 05/23/2024:  1. Left ventricular ejection fraction, by estimation, is 60 to 65%. The  left ventricle has normal function. The left ventricle has no regional  wall motion abnormalities. Left ventricular diastolic parameters are  consistent with Grade I diastolic  dysfunction (impaired relaxation).   2. Right ventricular systolic function is normal. The right ventricular  size is normal.   3. Left atrial size was mild to moderately dilated.   4. The mitral valve is normal in structure. No evidence of mitral valve  regurgitation. No evidence of mitral stenosis.   5. The aortic valve is normal in structure. There is mild calcification  of the aortic valve. There is mild thickening of the aortic valve. Aortic  valve regurgitation is mild to moderate. No aortic stenosis is present.   6. The inferior vena cava is normal in size with greater than 50%  respiratory variability, suggesting right atrial pressure of 3 mmHg.    History of Present Illness   Katherine Hall is a 83 y.o. female with hypertension, hyperlipidemia, CAD s/p CABG X5 (LIMA-LAD, SVG-OM1/OM3, SVG-diag, occluded SVG-RPDA), PAD s/p b/l SFA PTCA w/DCB, h/o GI bleed (last 2022).  Presented to the hospital with chief complaint of chest pain.  Symptoms were concerning for unstable angina.  Patient was taken to the Cath Lab for further evaluation and management.  Based on the heart cath report no acute culprit lesion was identified.  She did have severe native vessel disease with progression of disease. Mid RCA reported to be occluded and occluded SVG to RCA as well.  Remainder  of the the grafts are patent without any significant disease.  Medical therapy was recommended with uptitration of antianginal therapies and plan to discharge for 05/23/2024.  This morning patient is resting in bed comfortably.  No anginal chest pain.  Waiting to be discharged  home.   Hospital Course   05/22/2024: Left heart catheterization.  05/23/2024: Echocardiogram & potential discharge planning  Consultants: None     Did the patient have an acute coronary syndrome (MI, NSTEMI, STEMI, etc) this admission?:  Yes                               AHA/ACC ACS Clinical Performance & Quality Measures: Aspirin  prescribed? - No - on Plavix  and Pletal , history of GI bleed ADP Receptor Inhibitor (Plavix /Clopidogrel , Brilinta/Ticagrelor or Effient/Prasugrel) prescribed (includes medically managed patients)? - Yes Beta Blocker prescribed? - Yes High Intensity Statin (Lipitor 40-80mg  or Crestor  20-40mg ) prescribed? - No - statin intolerance, on Repatha  and Nexlizet  EF assessed during THIS hospitalization? - Yes For EF <40%, was ACEI/ARB prescribed? - Yes For EF <40%, Aldosterone Antagonist (Spironolactone or Eplerenone) prescribed? - Not Applicable (EF >/= 40%) Cardiac Rehab Phase II ordered (including medically managed patients)? - Yes   The patient will be scheduled for a TOC follow up appointment in 7 days.  A message has been sent to the [facility] and Scheduling Pool at the office where the patient should be seen for follow up.  _____________  Discharge Vitals Blood pressure (!) 116/90, pulse 73, temperature 97.7 F (36.5 C), temperature source Oral, resp. rate 18, height 4' 7 (1.397 m), weight 45.8 kg, SpO2 100%.  Filed Weights   05/22/24 1304  Weight: 45.8 kg   General: Age-appropriate, hemodynamically stable, no acute distress Lungs: Clear to auscultation bilaterally, no wheezes rales or rhonchi's Heart: Regular, positive S1-S2, no obvious murmurs rubs or gallops appreciated, no JVP Abdomen: Soft, nontender, nondistended, positive bowel sounds in all 4 quadrants Extremities: No lower extremity swelling, warm to touch, right femoral site is clean dry, appropriate hemostasis, no hematoma, no bruit Neuro: Nonfocal, moves all 4 extremities,  Labs & Radiologic  Studies  CBC Recent Labs    05/22/24 1306 05/22/24 2053  WBC 16.1* 13.1*  HGB 12.5 12.6  HCT 37.9 37.9  MCV 97.2 96.7  PLT 290 210   Basic Metabolic Panel Recent Labs    11/91/47 1306 05/22/24 2053  NA 135  --   K 3.2*  --   CL 100  --   CO2 23  --   GLUCOSE 104*  --   BUN 21  --   CREATININE 0.89 0.92  CALCIUM  9.5  --   MG 2.1  --    Liver Function Tests Recent Labs    05/22/24 1306  AST 16  ALT 12  ALKPHOS 50  BILITOT 0.7  PROT 6.2*  ALBUMIN  3.2*   No results for input(s): LIPASE, AMYLASE in the last 72 hours. High Sensitivity Troponin:   Recent Labs  Lab 05/22/24 1306 05/22/24 1528  TROPONINIHS 19* 20*    No results for input(s): TRNPT in the last 720 hours.  BNP Invalid input(s): POCBNP No results for input(s): PROBNP in the last 72 hours.  Recent Labs    05/22/24 1307  BNP 179.6*    D-Dimer No results for input(s): DDIMER in the last 72 hours. Hemoglobin A1C No results for input(s): HGBA1C in the last 72 hours. Fasting Lipid Panel No  results for input(s): CHOL, HDL, LDLCALC, TRIG, CHOLHDL, LDLDIRECT in the last 72 hours. Lipoprotein (a)  Date/Time Value Ref Range Status  09/30/2022 08:53 AM 282.0 (H) <75.0 nmol/L Final    Comment:    **Results verified by repeat testing** Note:  Values greater than or equal to 75.0 nmol/L may        indicate an independent risk factor for CHD,        but must be evaluated with caution when applied        to non-Caucasian populations due to the        influence of genetic factors on Lp(a) across        ethnicities.     Thyroid  Function Tests No results for input(s): TSH, T4TOTAL, T3FREE, THYROIDAB in the last 72 hours.  Invalid input(s): FREET3 _____________  CARDIAC CATHETERIZATION Result Date: 05/22/2024 Images from the original result were not included. Coronary and bypass graft angiography 05/22/2024: LM: Severe diffuse disease LAD: Proximal occlusion, bypassed  by patent LIMA-LAD graft          Diagonal vessel bypassed by patent SVG-diag graft with moderate disease at anastomosis, unchanged since 2023 Lcx: Proximal occlusion of LCx/OM1, bypassed by patent SVG-OM1/OM 3 sequential graft         Diffuse mid circumflex disease after graft touchdown RCA: Tortuous vessel with severe diffuse disease, followed by mid occlusion (occlusion is new since previous cath in 2023)           SVG-RPDA graft is known occluded, not engaged today LVEDP 11 mmHg No acute culprit lesion identified Severe native vessel disease, progressed since 2023-notably with occlusion of mid RCA Occluded SVG-PDA graft, patent all other grafts without significant stenosis I suspect her angina is due to territory previously supplied by RCA and SVG-RPDA Continue medical management, with uptitration of antianginal medications as tolerated Okay to discharge the patient on 6/19 AM Manish Corliss Dies, MD   DG Chest Portable 1 View Result Date: 05/22/2024 CLINICAL DATA:  chest pain EXAM: PORTABLE CHEST - 1 VIEW COMPARISON:  January 20, 2024 FINDINGS: Sternotomy wires and CABG changes. No focal airspace consolidation, pleural effusion, or pneumothorax. Hiatal hernia. No cardiomegaly. No acute fracture or destructive lesion. Multilevel thoracic osteophytosis. IMPRESSION: No acute cardiopulmonary abnormality. Electronically Signed   By: Rance Burrows M.D.   On: 05/22/2024 15:08   VAS US  ABI WITH/WO TBI Result Date: 04/25/2024  LOWER EXTREMITY DOPPLER STUDY Patient Name:  Janelle Spellman  Date of Exam:   04/24/2024 Medical Rec #: [id number]           Accession #:    [id number] Date of Birth: 1941-08-01            Patient Gender: F Patient Age:   29 years Exam Location:  [address] Procedure:      VAS US  ABI WITH/WO TBI Referring Phys: [facility] ARIDA --------------------------------------------------------------------------------  Indications: Claudication, and peripheral artery disease. High Risk Factors:  Hypertension, hyperlipidemia, coronary artery disease. Other Factors: SEE BILATERAL LEG ARTERIAL DUPLEX REPORT                 History of CABG x 5.  Vascular Interventions: 11/06/2019 bilateral SFA angioplasty                          03/31/2021 right SFA angioplasty  09/15/2021 left SFA angioplasty/atherectomy. Comparison Study: 04/24/23 Performing Technologist: Aloma Arrant RVS  Examination Guidelines: A complete evaluation includes at minimum, Doppler waveform signals and systolic blood pressure reading at the level of bilateral brachial, anterior tibial, and posterior tibial arteries, when vessel segments are accessible. Bilateral testing is considered an integral part of a complete examination. Photoelectric Plethysmograph (PPG) waveforms and toe systolic pressure readings are included as required and additional duplex testing as needed. Limited examinations for reoccurring indications may be performed as noted.  ABI Findings: +---------+------------------+-----+----------+--------+ Right    Rt Pressure (mmHg)IndexWaveform  Comment  +---------+------------------+-----+----------+--------+ Brachial 174                                       +---------+------------------+-----+----------+--------+ PTA      93                0.53 monophasic         +---------+------------------+-----+----------+--------+ DP       103               0.59 monophasic         +---------+------------------+-----+----------+--------+ Great Toe71                0.41                    +---------+------------------+-----+----------+--------+ +---------+------------------+-----+-----------+-------+ Left     Lt Pressure (mmHg)IndexWaveform   Comment +---------+------------------+-----+-----------+-------+ Brachial 163                                       +---------+------------------+-----+-----------+-------+ PTA      148               0.85 monophasic          +---------+------------------+-----+-----------+-------+ DP       154               0.89 multiphasic        +---------+------------------+-----+-----------+-------+ Great Toe127               0.73                    +---------+------------------+-----+-----------+-------+ +-------+-----------+-----------+------------+------------+ ABI/TBIToday's ABIToday's TBIPrevious ABIPrevious TBI +-------+-----------+-----------+------------+------------+ Right  0.59       0.41       0.41        0.31         +-------+-----------+-----------+------------+------------+ Left   0.89       0.73       0.80        0.56         +-------+-----------+-----------+------------+------------+ Right ABIs appear increased compared to prior study on 04/24/23. Left ABIs appear essentially unchanged compared to prior study on 04/24/23.  Summary: Right: Resting right ankle-brachial index indicates moderate right lower extremity arterial disease. The right toe-brachial index is abnormal. Left: Resting left ankle-brachial index indicates mild left lower extremity arterial disease. The left toe-brachial index is normal. *See table(s) above for measurements and observations.  Electronically signed by Lauro Portal MD on 04/25/2024 at 9:59:02 AM.    Final    VAS US  LOWER EXTREMITY ARTERIAL DUPLEX Result Date: 04/25/2024 LOWER EXTREMITY ARTERIAL DUPLEX STUDY Patient Name:  Toree Edling  Date of Exam:   04/24/2024 Medical Rec #: [id number]  Accession #:    [id number] Date of Birth: 27-Aug-1941            Patient Gender: F Patient Age:   83 years Exam Location:  [address] Procedure:      VAS US  LOWER EXTREMITY ARTERIAL DUPLEX Referring Phys: [facility] ARIDA --------------------------------------------------------------------------------  Indications: Claudication, and peripheral artery disease. High Risk Factors: Hypertension, hyperlipidemia, coronary artery disease. Other Factors: SEE ABI REPORT                  History of CABG x 5.  Vascular Interventions: 11/06/2019 bilateral SFA angioplasty                          03/31/2021 right SFA angioplasty                          09/15/2021 left SFA angioplasty/atherectomy. Current ABI:            R 0.59 L 0.89 Comparison Study: 04/24/23 Performing Technologist: Aloma Arrant RVS  Examination Guidelines: A complete evaluation includes B-mode imaging, spectral Doppler, color Doppler, and power Doppler as needed of all accessible portions of each vessel. Bilateral testing is considered an integral part of a complete examination. Limited examinations for reoccurring indications may be performed as noted.  +-----------+--------+-----+---------------+----------+----------+ RIGHT      PSV cm/sRatioStenosis       Waveform  Comments   +-----------+--------+-----+---------------+----------+----------+ CFA Distal 119                         monophasic           +-----------+--------+-----+---------------+----------+----------+ DFA        185          30-49% stenosistriphasic            +-----------+--------+-----+---------------+----------+----------+ SFA Prox   174          30-49% stenosismonophasic           +-----------+--------+-----+---------------+----------+----------+ SFA Mid    112                         monophasic           +-----------+--------+-----+---------------+----------+----------+ SFA Distal 74                          monophasic           +-----------+--------+-----+---------------+----------+----------+ POP Mid                 occluded                            +-----------+--------+-----+---------------+----------+----------+ POP Distal              occluded                            +-----------+--------+-----+---------------+----------+----------+ TP Trunk                occluded                            +-----------+--------+-----+---------------+----------+----------+ ATA Prox   31  monophasic           +-----------+--------+-----+---------------+----------+----------+ ATA Mid    26                          monophasic           +-----------+--------+-----+---------------+----------+----------+ ATA Distal 28                          monophasic           +-----------+--------+-----+---------------+----------+----------+ PTA Prox   16                          monophasic           +-----------+--------+-----+---------------+----------+----------+ PTA Mid    12                          monophasic           +-----------+--------+-----+---------------+----------+----------+ PTA Distal 17                          monophasic           +-----------+--------+-----+---------------+----------+----------+ PERO Mid                occluded                            +-----------+--------+-----+---------------+----------+----------+ PERO Distal7                                     retrograde +-----------+--------+-----+---------------+----------+----------+  +-----------+--------+-----+---------------+----------+----------+ LEFT       PSV cm/sRatioStenosis       Waveform  Comments   +-----------+--------+-----+---------------+----------+----------+ CFA Distal 131                         biphasic             +-----------+--------+-----+---------------+----------+----------+ DFA        242          50-74% stenosisbiphasic             +-----------+--------+-----+---------------+----------+----------+ SFA Prox   142                         biphasic             +-----------+--------+-----+---------------+----------+----------+ SFA Mid    118                         biphasic             +-----------+--------+-----+---------------+----------+----------+ SFA Distal 69                          biphasic             +-----------+--------+-----+---------------+----------+----------+ POP Prox   122                          triphasic            +-----------+--------+-----+---------------+----------+----------+ POP Distal 93  biphasic             +-----------+--------+-----+---------------+----------+----------+ TP Trunk   128                         biphasic             +-----------+--------+-----+---------------+----------+----------+ ATA Prox   247          50-74% stenosisbiphasic             +-----------+--------+-----+---------------+----------+----------+ ATA Mid    61                          biphasic             +-----------+--------+-----+---------------+----------+----------+ ATA Distal 73                          biphasic             +-----------+--------+-----+---------------+----------+----------+ PTA Prox   32                          monophasic           +-----------+--------+-----+---------------+----------+----------+ PTA Mid    14                          monophasic           +-----------+--------+-----+---------------+----------+----------+ PTA Distal 12                          monophasic           +-----------+--------+-----+---------------+----------+----------+ PERO Prox  52                          biphasic             +-----------+--------+-----+---------------+----------+----------+ PERO Mid   68                          biphasic             +-----------+--------+-----+---------------+----------+----------+ PERO Distal60                                    retrograde +-----------+--------+-----+---------------+----------+----------+  Summary: Right: 30-49% stenosis noted in the deep femoral artery. 30-49% stenosis noted in the superficial femoral artery. Total occlusion noted in the popliteal artery. Total occlusion noted in the peroneal artery. Moderate atherosclerosis noted throughout extremity.  Left: 50-74% stenosis noted in the deep femoral artery. 50-74% stenosis noted in the anterior tibial  artery. Moderate atherosclerosis noted throughout extremity.   See table(s) above for measurements and observations. Electronically signed by Lauro Portal MD on 04/25/2024 at 9:58:29 AM.    Final     Disposition Pt is being discharged home today in good condition.  Follow-up Plans & Appointments  [facility] visit scheduled for May 27, 2024  Discharge Medications Allergies as of 05/23/2024       Reactions   Penicillins Anaphylaxis   Did it involve swelling of the face/tongue/throat, SOB, or low BP? Yes Did it involve sudden or severe rash/hives, skin peeling, or any reaction on the inside of your mouth or nose? No Did you need to seek medical attention at a hospital  or doctor's office? Yes When did it last happen?      10+ years If all above answers are "NO", may proceed with cephalosporin use.   Lincomycin Swelling, Other (See Comments)   Passed out, also   Naproxen Sodium Other (See Comments)   Stomach ulcers   Statins Other (See Comments)   Myalgias = Atorvastatin , Rosuvastatin    Latex Other (See Comments)   Blisters when in contact with skin   Tape Other (See Comments)   Blistering        Medication List     STOP taking these medications    ezetimibe  10 MG tablet Commonly known as: ZETIA        TAKE these medications    acyclovir  400 MG tablet Commonly known as: ZOVIRAX  Take 400 mg by mouth daily as needed (for blister outbreaks).   amiodarone  200 MG tablet Commonly known as: PACERONE  Take 1 tablet (200 mg total) by mouth daily.   cilostazol  100 MG tablet Commonly known as: PLETAL  Take 1 tablet (100 mg total) by mouth 2 (two) times daily. What changed: when to take this   clopidogrel  75 MG tablet Commonly known as: PLAVIX  Take 75 mg by mouth daily.   diphenhydramine -acetaminophen  25-500 MG Tabs tablet Commonly known as: TYLENOL  PM Take 2 tablets by mouth at bedtime.   fluticasone  50 MCG/ACT nasal spray Commonly known as: FLONASE  Place 1 spray into  both nostrils daily as needed for allergies or rhinitis.   isosorbide  mononitrate 60 MG 24 hr tablet Commonly known as: IMDUR  TAKE 1 TABLET BY MOUTH EVERY DAY   levothyroxine  75 MCG tablet Commonly known as: SYNTHROID  Take 75 mcg by mouth daily before breakfast.   metoprolol  tartrate 25 MG tablet Commonly known as: LOPRESSOR  Take 25 mg by mouth in the morning and at bedtime.   Nexlizet  180-10 MG Tabs Generic drug: Bempedoic Acid -Ezetimibe  Take 1 tablet by mouth daily.   nitroGLYCERIN  0.4 MG SL tablet Commonly known as: NITROSTAT  Dissolve 1 tablet under the tongue every 5 minutes as needed for chest pain. Max of 3 doses, then 911. What changed:  how much to take how to take this when to take this reasons to take this   pantoprazole  20 MG tablet Commonly known as: PROTONIX  Take 20 mg by mouth 2 (two) times daily before a meal.   polyethylene glycol powder 17 GM/SCOOP powder Commonly known as: GLYCOLAX /MIRALAX  Take 17 g by mouth daily as needed for mild constipation or moderate constipation.   ranolazine  500 MG 12 hr tablet Commonly known as: Ranexa  Take 1 tablet (500 mg total) by mouth 2 (two) times daily.   Repatha  SureClick 140 MG/ML Soaj Generic drug: Evolocumab  Inject 140 mg into the skin every 14 (fourteen) days.   rOPINIRole  2 MG tablet Commonly known as: REQUIP  Take 2 mg by mouth at bedtime.   traMADol  50 MG tablet Commonly known as: ULTRAM  Take 50 mg by mouth 2 (two) times daily.   valsartan  80 MG tablet Commonly known as: DIOVAN  TAKE 1 TABLET BY MOUTH EVERY DAY         Outstanding Labs/Studies None Uptitrate antianginal therapies based on symptoms.  Duration of Discharge Encounter: Time: 39 minutes   Signed, Olinda Bertrand, DO, [facility]  A Division of [facility] [address] [place], [place] [address]  [place], [place] [address] Pager: [phone] Office: [phone] 1:13 PM 05/23/24

## 2024-05-23 NOTE — Care Management CC44 (Signed)
 Condition Code 44 Documentation Completed  Patient Details  Name: Katherine Hall MRN: [id number] Date of Birth: Sep 18, 1941   Condition Code 44 given:  Yes Patient signature on Condition Code 44 notice:  Yes Documentation of 2 MD's agreement:  Yes Code 44 added to claim:  Yes    Cosimo Diones, RN 05/23/2024, 2:31 PM

## 2024-05-23 NOTE — Progress Notes (Signed)
 Discharge teaching complete. Meds, diet, activity, follow up appointments and cath site reviewed and all questions answered. Copy of instructions given to patient and patient will pick up Ranexa  at [facility] pharmacy. Husband here to take patient home.

## 2024-05-23 NOTE — Care Management Obs Status (Signed)
 MEDICARE OBSERVATION STATUS NOTIFICATION   Patient Details  Name: Katherine Hall MRN: [id number] Date of Birth: 1941-12-01   Medicare Observation Status Notification Given:  Yes    Cosimo Diones, RN 05/23/2024, 2:31 PM

## 2024-05-23 NOTE — TOC CM/SW Note (Signed)
 Transition of Care [facility]) - Inpatient Brief Assessment   Patient Details  Name: Katherine Hall MRN: [id number] Date of Birth: 09-Jan-1941  Transition of Care [facility]) CM/SW Contact:    Cosimo Diones, RN Phone Number: 05/23/2024, 2:34 PM   Clinical Narrative: Patient presented for chest pain. PTA patient was from home with spouse. Patient states she has a PCP and gets to appointments without any issues. No home needs identified at this time. Patient states her spouse will provide transportation home.    Transition of Care Asessment: Insurance and Status: Insurance coverage has been reviewed Patient has primary care physician: Yes Home environment has been reviewed: reviewed Prior level of function:: independent Prior/Current Home Services: No current home services Social Drivers of Health Review: SDOH reviewed no interventions necessary Readmission risk has been reviewed: Yes Transition of care needs: no transition of care needs at this time

## 2024-05-27 ENCOUNTER — Ambulatory Visit (HOSPITAL_BASED_OUTPATIENT_CLINIC_OR_DEPARTMENT_OTHER): Admitting: Nurse Practitioner

## 2024-05-27 ENCOUNTER — Encounter (HOSPITAL_BASED_OUTPATIENT_CLINIC_OR_DEPARTMENT_OTHER): Payer: Self-pay | Admitting: Nurse Practitioner

## 2024-05-27 VITALS — BP 120/70 | HR 66 | Wt 106.3 lb

## 2024-05-27 DIAGNOSIS — I10 Essential (primary) hypertension: Secondary | ICD-10-CM

## 2024-05-27 DIAGNOSIS — E876 Hypokalemia: Secondary | ICD-10-CM

## 2024-05-27 DIAGNOSIS — I493 Ventricular premature depolarization: Secondary | ICD-10-CM

## 2024-05-27 DIAGNOSIS — E7849 Other hyperlipidemia: Secondary | ICD-10-CM | POA: Diagnosis not present

## 2024-05-27 DIAGNOSIS — Z79899 Other long term (current) drug therapy: Secondary | ICD-10-CM

## 2024-05-27 DIAGNOSIS — I25118 Atherosclerotic heart disease of native coronary artery with other forms of angina pectoris: Secondary | ICD-10-CM | POA: Diagnosis not present

## 2024-05-27 DIAGNOSIS — I739 Peripheral vascular disease, unspecified: Secondary | ICD-10-CM

## 2024-05-27 DIAGNOSIS — E785 Hyperlipidemia, unspecified: Secondary | ICD-10-CM

## 2024-05-27 MED ORDER — ISOSORBIDE MONONITRATE ER 30 MG PO TB24: 60.0000 mg | ORAL_TABLET | Freq: Every day | ORAL | 1 refills | Status: DC

## 2024-05-27 NOTE — Patient Instructions (Addendum)
 Medication Instructions:  Your physician has recommended you make the following change in your medication:   Decrease Imdur  30mg  daily- if after 2 weeks you do not feel better please let us  know and increase to 60mg  daily  *If you need a refill on your cardiac medications before your next appointment, please call your pharmacy*  Lab Work: BMP today    Follow-Up:  Your next appointment:   3 months with Dr. Darron or APP at [facility]

## 2024-05-27 NOTE — Progress Notes (Signed)
 Cardiology Office Note   Date:  05/27/2024  ID:  Katherine Hall, Katherine Hall [birth year], MRN [id number] PCP: Billy Toribio Kerns, DO  [facility] Providers Cardiologist:  Deatrice Cage, MD     [facility] Coronary artery disease S/p CABG x 5 in 2017 LIMA-LAD, SVG to diagonal, SVG to PDA, sequential SVG to OM1 and OM2) Hypertension Hyperlipidemia PAD Anemia Statin intolerance  She underwent CABG in 2017.  History of noncompliance with medications.  She was referred to lipid clinic and started on Repatha  but reported adverse side effects including nausea and vomiting.  Hospitalized March 2022 with unstable angina.  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  Dr. Cage attempted angioplasty of SVG to right PDA but was unable to cross the stenosis.  Subsequently there was loss of flow and acute closure with wire manipulation.  She was treated medically.  Known to have peripheral arterial disease with claudication s/p post bilateral SFA endovascular intervention with drug-coated balloon angioplasty.  Hospitalized June 2022 with GI bleeding requiring transfusion.  Plavix  was held and then resumed without aspirin .  On amiodarone  for symptomatic PVCs.  Seen by Dr. Cage 02/20/24 with lower extremity arterial Doppler testing May 2024 which showed a drop in ABI to 0.4 on the right side and evidence of new right popliteal artery occlusion into the TP trunk.  She was having significant claudication worse on the right side.  She was advised to continue cilostazol .  He discussed option of structured exercise therapy but she declined.  Repeat arterial studies 04/24/2024 revealed improved ABI on both sides with plan to repeat in 1 year.  Admission 05/22/2024 with unstable angina.  Cardiac cath revealed no acute culprit lesion.  Suspicion that angina  due to territory previously supplied by RCA and SVG to RPDA with plans for medical management.  Antianginal therapy includes metoprolol , Imdur , and initiation of Ranexa  500 mg twice daily with use of sublingual nitroglycerin  as needed.  Echo 05/23/2024 with normal LVEF 60 to 65%, G1 DD, normal RV, mild calcification of the aortic valve with no stenosis, mild to moderate AI.  History of Present Illness Discussed the use of AI scribe software for clinical note transcription with the patient, who gave verbal consent to proceed.  History of Present Illness Katherine Hall is a pleasant 83 y.o. female who is here today for follow-up of CAD. She experiences chest pain described as 'pings' radiating from left to right, sometimes extending to her arm and across her back. No significant chest pain or dyspnea concerning for angina. These pains have been intermittent since the time of her CABG in 2017. Her main concern today is significant fatigue, feeling easily exhausted and lacking energy, affecting her ability to perform simple tasks. Weakness in her arms makes it difficult to perform tasks such as picking things up. She finds cardiac rehab too exhausting to consider. She has frequent nausea with her medications but reports compliance. She experiences lightheadedness and weakness, sometimes feeling like she might pass out. Occasional vomiting of clear phlegm and a reduced appetite are noted. Reports that only 1 of her 8 siblings does not have heart problems. Her sister recently got a pacemaker. Her husband does not feel that she eats enough. She loves seafood, particularly oysters. Often eats salads, tuna, and vegetables. She does not feel able to participate in regular exercise. She  denies orthopnea, PND, edema, palpitations, presyncope or syncope.    ROS: See HPI  Studies Reviewed      Lipoprotein (a)  Date/Time Value Ref Range Status  09/30/2022 08:53 AM 282.0 (H) <75.0 nmol/L Final    Comment:     **Results verified by repeat testing** Note:  Values greater than or equal to 75.0 nmol/L may        indicate an independent risk factor for CHD,        but must be evaluated with caution when applied        to non-Caucasian populations due to the        influence of genetic factors on Lp(a) across        ethnicities.     Risk Assessment/Calculations           Physical Exam VS:  BP 120/70   Pulse 66   Wt 106 lb 4.8 oz (48.2 kg)   SpO2 97%   BMI 24.71 kg/m    Wt Readings from Last 3 Encounters:  05/27/24 106 lb 4.8 oz (48.2 kg)  05/22/24 101 lb (45.8 kg)  04/06/24 110 lb (49.9 kg)    GEN: Well nourished, well developed in no acute distress NECK: No JVD; No carotid bruits CARDIAC: RRR, no murmurs, rubs, gallops RESPIRATORY:  Clear to auscultation without rales, wheezing or rhonchi  ABDOMEN: Soft, non-tender, non-distended EXTREMITIES:  No edema; No deformity    Assessment & Plan Coronary artery disease with stable angina   We discussed recent cath from 6/18 that revealed SVG-RPDA graft is occluded. She feels that chest pain is stable.  Is more concerned about fatigue, which is limiting her participation and regular daily activities.  No dyspnea, or other symptoms concerning for worsening angina. No recent SL NTG use.  We discussed potential causes including medication side effect, hypotension, poor nutrition. Encouraged balanced meals throughout the day to avoid prolonged periods of fasting. We will reduce Imdur  to 30 mg daily and monitor energy and chest pain for two weeks. If no improvement, return to 60 mg. Continue Ranexa , metoprolol , and Repatha . Encourage dietary modifications for protein and balanced meals. Consider chair exercises for strength and energy. Continue valsartan , Plavix , Imdur , Ranexa , metoprolol , Nexlizet , Repatha .   Medication Management She admits to frequent nausea with medication, often taken on an empty stomach. Fatigue significant enough to interfere  with ADLs. We discussed possibility that symptoms are secondary to medication side effects.  Encouraged improved nutrition and hydration including eating small meals throughout the day to avoid prolonged periods of fasting. We will reduce Imdur  to 30 mg daily to see if energy levels improve. She plans to resume sewing with church members which she really enjoys.  Encouraged her to notify us  if she has concerns.  Advised that she may resume Imdur  60 mg if no improvement after 2 weeks. Encouraged her to participate in chair exercises.   Hypertension BP is well controlled. No change to anti-anginal therapy today.   Hyperlipidemia LDL goal < 55 Lipid panel completed 02/01/23 revealed total cholesterol 130, triglycerides 125, HDL 64, LDL-C 44.  Plan to recheck at next office visit.  Continue Repatha , next Lizette.  PAD She reports significant claudication with lower extremity vascular studies 04/2024 that showed improved blood flow.  No significant concerns today.  Management per Dr. Darron. Continue Pletal .   PVCs On amiodarone  for management of frequent PVCs. Palpitations are quiescent at this time.   Hypokalemia   Recent history of hypokalemia. We will  recheck potassium level today.         Dispo: 3 months with Dr. Darron  Signed, Rosaline Bane, NP-C

## 2024-05-28 ENCOUNTER — Ambulatory Visit: Payer: Self-pay | Admitting: Nurse Practitioner

## 2024-05-28 LAB — BASIC METABOLIC PANEL WITH GFR
BUN/Creatinine Ratio: 19 (ref 12–28)
BUN: 17 mg/dL (ref 8–27)
CO2: 21 mmol/L (ref 20–29)
Calcium: 9.2 mg/dL (ref 8.7–10.3)
Chloride: 98 mmol/L (ref 96–106)
Creatinine, Ser: 0.9 mg/dL (ref 0.57–1.00)
Glucose: 98 mg/dL (ref 70–99)
Potassium: 3.8 mmol/L (ref 3.5–5.2)
Sodium: 135 mmol/L (ref 134–144)
eGFR: 63 mL/min/{1.73_m2} (ref >59)

## 2024-06-10 ENCOUNTER — Encounter (HOSPITAL_BASED_OUTPATIENT_CLINIC_OR_DEPARTMENT_OTHER): Payer: Self-pay

## 2024-06-13 ENCOUNTER — Other Ambulatory Visit: Payer: Self-pay | Admitting: Cardiovascular Disease

## 2024-06-17 ENCOUNTER — Other Ambulatory Visit: Payer: Self-pay | Admitting: Pharmacist Clinician (PhC)/ Clinical Pharmacy Specialist

## 2024-06-17 DIAGNOSIS — I739 Peripheral vascular disease, unspecified: Secondary | ICD-10-CM

## 2024-06-17 DIAGNOSIS — I25118 Atherosclerotic heart disease of native coronary artery with other forms of angina pectoris: Secondary | ICD-10-CM

## 2024-06-17 DIAGNOSIS — E7849 Other hyperlipidemia: Secondary | ICD-10-CM

## 2024-06-17 MED ORDER — REPATHA SURECLICK 140 MG/ML ~~LOC~~ SOAJ: 1.0000 mL | SUBCUTANEOUS | 3 refills | Status: AC

## 2024-07-02 ENCOUNTER — Other Ambulatory Visit: Payer: Self-pay | Admitting: Cardiovascular Disease

## 2024-08-19 ENCOUNTER — Encounter (HOSPITAL_BASED_OUTPATIENT_CLINIC_OR_DEPARTMENT_OTHER): Payer: Self-pay | Admitting: *Deleted

## 2024-08-19 ENCOUNTER — Emergency Department (HOSPITAL_BASED_OUTPATIENT_CLINIC_OR_DEPARTMENT_OTHER)
Admission: EM | Admit: 2024-08-19 | Discharge: 2024-08-19 | Disposition: A | Discharge status: Home / Self Care | Attending: Emergency Medicine | Admitting: Emergency Medicine

## 2024-08-19 ENCOUNTER — Emergency Department (HOSPITAL_BASED_OUTPATIENT_CLINIC_OR_DEPARTMENT_OTHER)

## 2024-08-19 ENCOUNTER — Other Ambulatory Visit: Payer: Self-pay

## 2024-08-19 ENCOUNTER — Telehealth: Payer: Self-pay | Admitting: Cardiovascular Disease

## 2024-08-19 DIAGNOSIS — E039 Hypothyroidism, unspecified: Secondary | ICD-10-CM | POA: Insufficient documentation

## 2024-08-19 DIAGNOSIS — R079 Chest pain, unspecified: Secondary | ICD-10-CM

## 2024-08-19 DIAGNOSIS — Z79899 Other long term (current) drug therapy: Secondary | ICD-10-CM | POA: Diagnosis not present

## 2024-08-19 DIAGNOSIS — Z91040 Latex allergy status: Secondary | ICD-10-CM | POA: Diagnosis not present

## 2024-08-19 DIAGNOSIS — Z7902 Long term (current) use of antithrombotics/antiplatelets: Secondary | ICD-10-CM | POA: Diagnosis not present

## 2024-08-19 DIAGNOSIS — R0789 Other chest pain: Secondary | ICD-10-CM | POA: Diagnosis present

## 2024-08-19 DIAGNOSIS — R0602 Shortness of breath: Secondary | ICD-10-CM | POA: Insufficient documentation

## 2024-08-19 DIAGNOSIS — I10 Essential (primary) hypertension: Secondary | ICD-10-CM | POA: Insufficient documentation

## 2024-08-19 DIAGNOSIS — I2510 Atherosclerotic heart disease of native coronary artery without angina pectoris: Secondary | ICD-10-CM | POA: Diagnosis not present

## 2024-08-19 LAB — BASIC METABOLIC PANEL WITH GFR
Anion gap: 11 (ref 5–15)
BUN: 15 mg/dL (ref 8–23)
CO2: 27 mmol/L (ref 22–32)
Calcium: 9.5 mg/dL (ref 8.9–10.3)
Chloride: 100 mmol/L (ref 98–111)
Creatinine, Ser: 0.78 mg/dL (ref 0.44–1.00)
GFR, Estimated: >60 mL/min (ref >60)
Glucose, Bld: 91 mg/dL (ref 70–99)
Potassium: 3.8 mmol/L (ref 3.5–5.1)
Sodium: 138 mmol/L (ref 135–145)

## 2024-08-19 LAB — CBC
HCT: 37.4 % (ref 36.0–46.0)
Hemoglobin: 12.4 g/dL (ref 12.0–15.0)
MCH: 31.7 pg (ref 26.0–34.0)
MCHC: 33.2 g/dL (ref 30.0–36.0)
MCV: 95.7 fL (ref 80.0–100.0)
Platelets: 360 K/uL (ref 150–400)
RBC: 3.91 MIL/uL (ref 3.87–5.11)
RDW: 14 % (ref 11.5–15.5)
WBC: 7.8 K/uL (ref 4.0–10.5)
nRBC: 0 % (ref 0.0–0.2)

## 2024-08-19 LAB — TROPONIN T, HIGH SENSITIVITY
Troponin T High Sensitivity: 20 ng/L — ABNORMAL HIGH (ref 0–19)
Troponin T High Sensitivity: 16 ng/L (ref 0–19)

## 2024-08-19 LAB — PRO BRAIN NATRIURETIC PEPTIDE: Pro Brain Natriuretic Peptide: 1249 pg/mL — ABNORMAL HIGH (ref <300.0)

## 2024-08-19 MED ORDER — ISOSORBIDE MONONITRATE ER 30 MG PO TB24: 90.0000 mg | ORAL_TABLET | Freq: Every day | ORAL | 1 refills | Status: AC

## 2024-08-19 NOTE — ED Provider Notes (Signed)
 [place] [facility] AT [facility] Provider Note   CSN: [id number] Arrival date & time: 08/19/24  1221     Patient presents with: Chest pain   Katherine Hall is a 83 y.o. female who presents with a 2-week history of substernal chest discomfort radiating to the left arm which has progressively worsened over the last 2 weeks.  He has a previous history of CAD, ACS with CABG performed in August 2017.  Further as noted medical diagnoses of hypothyroidism, peripheral arterial disease, essential hypertension, and prediabetes.  Over the last 2 weeks, she states that the pain worsens with increased physical activity, specifically overhead extension as well as picking up of objects, has used over-the-counter pain medication with some relief however despite this pain continues.  Another notable finding is that the patient states that she cannot tolerate long periods of minor physical activity such as walking without feeling very fatigued and short of breath.  Denies having any cough, denies any productive cough, denies orthopnea, endorses occasional edema of the left lower extremity.  Review of outpatient cardiology notes from June 2025 shows that in 2022 cardiac cath showed 60% anastomosis stenosis to the right PDA, 90% stenosis in the proximal portion of the SVG to right PDA, attempted balloon angioplasty with conversion to medical therapy.  She is admitted back in June 2025 for unstable angina, at that time they added Ranexa  twice daily with pending echocardiogram.  Noted in workup at this time that she had progressed occlusion of the mid RCA.  Echocardiogram performed in June 2025 shows LVEF of 60 to 65% with normal left ventricular function, right ventricular function is normal, no noted valvular deficiencies.   HPI     Prior to Admission medications   Medication Sig Start Date End Date Taking? Authorizing Provider  acyclovir  (ZOVIRAX ) 400 MG tablet Take 400 mg by mouth  daily as needed (for blister outbreaks).    [provider]  amiodarone  (PACERONE ) 200 MG tablet Take 1 tablet (200 mg total) by mouth daily. 04/09/24   Pokhrel, Laxman, MD  cilostazol  (PLETAL ) 100 MG tablet Take 1 tablet (100 mg total) by mouth 2 (two) times daily. Patient taking differently: Take 100 mg by mouth in the morning and at bedtime. 08/08/23   Nahser, Aleene PARAS, MD  clopidogrel  (PLAVIX ) 75 MG tablet Take 75 mg by mouth daily.    [provider]  diphenhydramine -acetaminophen  (TYLENOL  PM) 25-500 MG TABS tablet Take 2 tablets by mouth at bedtime.    [provider]  Evolocumab  (REPATHA  SURECLICK) 140 MG/ML SOAJ Inject 140 mg into the skin every 14 (fourteen) days. 06/17/24   Darron Deatrice LABOR, MD  fluticasone  (FLONASE ) 50 MCG/ACT nasal spray Place 1 spray into both nostrils daily as needed for allergies or rhinitis. 02/22/18   [provider]  isosorbide  mononitrate (IMDUR ) 30 MG 24 hr tablet Take 3 tablets (90 mg total) by mouth daily. Take one tablet daily for two weeks- if you do not feel better in two week please increase back to 60mg  08/19/24   Myriam Dorn BROCKS, PA  levothyroxine  (SYNTHROID ) 75 MCG tablet Take 75 mcg by mouth daily before breakfast.    [provider]  metoprolol  tartrate (LOPRESSOR ) 25 MG tablet Take 25 mg by mouth in the morning and at bedtime. 02/24/24   [provider]  NEXLIZET  180-10 MG TABS TAKE 1 TABLET BY MOUTH EVERY DAY 06/17/24   Darron Deatrice LABOR, MD  nitroGLYCERIN  (NITROSTAT ) 0.4 MG SL tablet  Dissolve 1 tablet under the tongue every 5 minutes as needed for chest pain. Max of 3 doses, then 911. Patient taking differently: Place 0.4 mg under the tongue every 5 (five) minutes as needed for chest pain. Dissolve 1 tablet under the tongue every 5 minutes as needed for chest pain. Max of 3 doses, then 911. 10/24/22   Nahser, Aleene PARAS, MD  pantoprazole  (PROTONIX ) 20 MG tablet Take 20 mg by mouth 2 (two) times daily  before a meal.    [provider]  polyethylene glycol powder (GLYCOLAX /MIRALAX ) 17 GM/SCOOP powder Take 17 g by mouth daily as needed for mild constipation or moderate constipation. 04/09/24   Pokhrel, Laxman, MD  ranolazine  (RANEXA ) 500 MG 12 hr tablet Take 1 tablet (500 mg total) by mouth 2 (two) times daily. 05/23/24   Tolia, Sunit, DO  rOPINIRole  (REQUIP ) 2 MG tablet Take 2 mg by mouth at bedtime.    [provider]  traMADol  (ULTRAM ) 50 MG tablet Take 50 mg by mouth 2 (two) times daily.    [provider]  valsartan  (DIOVAN ) 80 MG tablet TAKE 1 TABLET BY MOUTH EVERY DAY 07/13/23   Nahser, Aleene PARAS, MD    Allergies: Penicillins, Lincomycin, Naproxen sodium, Statins, Latex, and Tape    Review of Systems  Constitutional:  Positive for fatigue.  Respiratory:  Positive for shortness of breath.   Cardiovascular:  Positive for chest pain and leg swelling.  All other systems reviewed and are negative.   Updated Vital Signs BP (!) 142/77 (BP Location: Right Arm)   Pulse 69   Temp 97.7 F (36.5 C) (Oral)   Resp 14   Ht 4' 7 (1.397 m)   Wt 45.4 kg   SpO2 97%   BMI 23.24 kg/m   Physical Exam Vitals and nursing note reviewed.  Constitutional:      General: She is not in acute distress.    Appearance: Normal appearance.  HENT:     Head: Normocephalic and atraumatic.     Mouth/Throat:     Mouth: Mucous membranes are moist.     Pharynx: Oropharynx is clear.  Eyes:     Extraocular Movements: Extraocular movements intact.     Conjunctiva/sclera: Conjunctivae normal.     Pupils: Pupils are equal, round, and reactive to light.  Cardiovascular:     Rate and Rhythm: Normal rate and regular rhythm.     Pulses: Normal pulses.          Radial pulses are 2+ on the right side and 2+ on the left side.     Heart sounds: Normal heart sounds, S1 normal and S2 normal. No murmur heard.    No friction rub. No gallop.     Comments: Heart tones are normal without murmurs,  rubs, gallops.  No noted dependent edema. Pulmonary:     Effort: Pulmonary effort is normal.     Breath sounds: Normal breath sounds and air entry.  Abdominal:     General: Abdomen is flat. Bowel sounds are normal.     Palpations: Abdomen is soft.  Musculoskeletal:        General: Normal range of motion.     Cervical back: Normal range of motion and neck supple.     Right lower leg: No edema.     Left lower leg: No edema.  Skin:    General: Skin is warm and dry.     Capillary Refill: Capillary refill takes less than 2 seconds.  Neurological:  General: No focal deficit present.     Mental Status: She is alert. Mental status is at baseline.  Psychiatric:        Mood and Affect: Mood normal.     (all labs ordered are listed, but only abnormal results are displayed) Labs Reviewed  PRO BRAIN NATRIURETIC PEPTIDE - Abnormal; Notable for the following components:      Result Value   Pro Brain Natriuretic Peptide 1,249.0 (*)    All other components within normal limits  TROPONIN T, HIGH SENSITIVITY - Abnormal; Notable for the following components:   Troponin T High Sensitivity 20 (*)    All other components within normal limits  BASIC METABOLIC PANEL WITH GFR  CBC  TROPONIN T, HIGH SENSITIVITY    EKG: None  Radiology: DG Chest 2 View Result Date: 08/19/2024 CLINICAL DATA:  Chest pain EXAM: CHEST - 2 VIEW COMPARISON:  Chest radiograph dated 05/22/2024 FINDINGS: Low lung volumes with bronchovascular crowding. Heterogeneous rounded density in the midline lower mediastinum. No pleural effusion or pneumothorax. The heart size and mediastinal contours are within normal limits. Median sternotomy wires are nondisplaced. IMPRESSION: 1. Low lung volumes with bronchovascular crowding. 2. Heterogeneous rounded density in the midline lower mediastinum, which may represent a hiatal hernia. Electronically Signed   By: Limin  Xu M.D.   On: 08/19/2024 13:11     Procedures   Medications  Ordered in the ED - No data to display  Clinical Course as of 08/19/24 1637  Mon Aug 19, 2024  1319 DG Chest 2 View Negative findings of possible hiatal hernia, this is consistent with previous imaging findings which CT in May 2025 demonstrated small hiatal hernia.  Reviewing imaging shows consistency between images, no progression noted at this time. [JG]    Clinical Course User Index [JG] Myriam Dorn BROCKS, PA             HEART Score: 7                Katrine (Revised) Score: 1, Janiesha Score Interpretation: Low Risk Group: 7-9% incidence of pulmonary embolism from several studies PERC Score: 1, PERC Score Interpretation: If any criteria are positive, the PERC rule cannot be used to rule out PE in this patient Medical Decision Making Amount and/or Complexity of Data Reviewed Labs: ordered. Radiology: ordered. Decision-making details documented in ED Course.   Medical Decision Making:   Katherine Hall is a 83 y.o. female who presented to the ED today with chest pain with radiation to the left shoulder detailed above.    Additional history discussed with patient's family/caregivers.  External chart has been reviewed including previous admission records, cardiology records, echocardiography. Patient's presentation is complicated by their history of CAD, unstable angina.  Patient placed on continuous vitals and telemetry monitoring while in ED which was reviewed periodically.  Complete initial physical exam performed, notably the patient  was alert and oriented and in no apparent distress.  Physical exam was largely unremarkable.    Reviewed and confirmed nursing documentation for past medical history, family history, social history.    Initial Assessment:   With the patient's presentation of chest pain, consider possible ACS/unstable angina, respiratory infection/pneumonia, pneumothorax, pulmonary embolus.  Initial Plan:   Screening labs including CBC and Metabolic panel to  evaluate for infectious or metabolic etiology of disease.  CXR to evaluate for structural/infectious intrathoracic pathology.  EKG/Troponin testing/BNP testing to evaluate for cardiac pathology. Objective evaluation as below reviewed   Initial Study Results:  Laboratory  All laboratory results reviewed without evidence of clinically relevant pathology.   Exceptions include: proBNP is 1249  EKG EKG was reviewed independently. Rate, rhythm, axis, intervals all examined and without medically relevant abnormality. ST segments without concerns for elevations.    Radiology:  All images reviewed independently. Agree with radiology report at this time.   DG Chest 2 View Result Date: 08/19/2024 CLINICAL DATA:  Chest pain EXAM: CHEST - 2 VIEW COMPARISON:  Chest radiograph dated 05/22/2024 FINDINGS: Low lung volumes with bronchovascular crowding. Heterogeneous rounded density in the midline lower mediastinum. No pleural effusion or pneumothorax. The heart size and mediastinal contours are within normal limits. Median sternotomy wires are nondisplaced. IMPRESSION: 1. Low lung volumes with bronchovascular crowding. 2. Heterogeneous rounded density in the midline lower mediastinum, which may represent a hiatal hernia. Electronically Signed   By: Limin  Xu M.D.   On: 08/19/2024 13:11      Consults: Case discussed with Dr. Barbaraann with cardiology.   Reassessment and Plan:   Reviewed lab work that was obtained, proBNP is 1249, troponin is initially elevated to 20, repeat was 16.  EKG did not show any new abnormalities, chest x-ray was unremarkable as well.  It did demonstrate a hiatal hernia however this is stable compared to previous findings on x-ray and on CT.  As such, consulted with cardiology as noted, they recommend increasing her Imdur  to 90 mg and patient has follow-up appointment scheduled for 23 September.  At this time, the plan is for discharge with medication changes as noted, and follow-up at  scheduled appointment.  As her vital signs have remained within the normal limits, workup and physical exam is reassuring, at this time plan for discharge and outpatient follow-up is appropriate.       Final diagnoses:  Chest pain, unspecified type    ED Discharge Orders          Ordered    isosorbide  mononitrate (IMDUR ) 30 MG 24 hr tablet  Daily        08/19/24 1637               Myriam Dorn BROCKS, [place] 08/19/24 1638    Tonia Chew, MD 08/20/24 [phone]

## 2024-08-19 NOTE — Telephone Encounter (Signed)
 Chart reviewed - patient has presented to ED.   Katherine Sossamon S Devrin Monforte, NP

## 2024-08-19 NOTE — Telephone Encounter (Signed)
 Pt c/o of Chest Pain: STAT if active (IN THIS MOMENT) CP, including tightness, pressure, jaw pain, shoulder/upper arm/back pain, SOB, nausea, and vomiting.  1. Are you having CP right now (tightness, pressure, or discomfort)? Yes - left arm pain, chest and shoulder pain  2. Are you experiencing any other symptoms (ex. SOB, nausea, vomiting, sweating)? Vomiting  3. How long have you been experiencing CP? Two weeks  4. Is your CP continuous or coming and going? Comes and goes  5. Have you taken Nitroglycerin ? Yes  6. If CP returns before callback, please consider calling 911. ?

## 2024-08-19 NOTE — ED Notes (Signed)
 Patient transported to X-ray

## 2024-08-19 NOTE — ED Notes (Addendum)
 Assuming pt care at this time.

## 2024-08-19 NOTE — Discharge Instructions (Addendum)
 As discussed, please follow-up with your cardiologist on the 23rd.  Increase your Imdur  as noted, now taking 3 tablets instead of 2.  If you have any changes prior to this appointment, please see us  back here in the emergency department, otherwise follow-up as previously scheduled.

## 2024-08-19 NOTE — Telephone Encounter (Signed)
 Call transferred from scheduling, patient reporting active chest pain. Patient reporting worsening chest pain for the last 2 weeks. She reports that the chest pain was resolving with frequent  nitroglycerin  usage but now it does not seem to help. She also reports chest pain seems to be radiating to jaw and left arm as well as her back. Patient has history of CABG and hospitalization for unstable angina March 2022 and 05/22/24. At visit with Katherine Bane, NP on 05/27/24 she reported she was not using SL nitroglycerin . She  Advised patient to call 911 or go to ED, patient verbalizes understanding and agrees to plan.

## 2024-08-19 NOTE — ED Triage Notes (Signed)
 Pt is here for chest pain with radiation to neck and arms which has been going on for 2 weeks.  Pt had nausea with this today and vomited x1.

## 2024-08-27 ENCOUNTER — Ambulatory Visit: Attending: Cardiovascular Disease | Admitting: Cardiovascular Disease

## 2024-08-27 ENCOUNTER — Encounter: Payer: Self-pay | Admitting: Cardiovascular Disease

## 2024-08-27 VITALS — BP 130/90 | HR 72 | Ht <= 58 in | Wt 99.4 lb

## 2024-08-27 DIAGNOSIS — I10 Essential (primary) hypertension: Secondary | ICD-10-CM | POA: Diagnosis not present

## 2024-08-27 DIAGNOSIS — Z951 Presence of aortocoronary bypass graft: Secondary | ICD-10-CM

## 2024-08-27 DIAGNOSIS — I25119 Atherosclerotic heart disease of native coronary artery with unspecified angina pectoris: Secondary | ICD-10-CM | POA: Diagnosis not present

## 2024-08-27 DIAGNOSIS — I739 Peripheral vascular disease, unspecified: Secondary | ICD-10-CM

## 2024-08-27 DIAGNOSIS — R079 Chest pain, unspecified: Secondary | ICD-10-CM | POA: Diagnosis not present

## 2024-08-27 MED ORDER — RANOLAZINE ER 500 MG PO TB12: 500.0000 mg | ORAL_TABLET | Freq: Two times a day (BID) | ORAL | 3 refills | Status: AC

## 2024-08-27 NOTE — Progress Notes (Signed)
 Cardiology Office Note   Date:  08/27/2024   ID:  Katherine Hall, Katherine Hall 11/19/1941, MRN [id number]  PCP:  Billy Toribio Kerns, DO  Cardiologist: Dr. Darron  No chief complaint on file.      History of Present Illness: Katherine Hall is a 83 y.o. female who is here today for follow-up regarding peripheral arterial disease and coronary artery disease.   She has known history of coronary artery disease status post CABG in 2017, essential hypertension, anemia and hyperlipidemia with intolerance to statins.  She is not diabetic and is a lifelong non-smoker. She was hospitalized in March, 2022 with unstable angina.  Cardiac catheterization showed severe underlying three-vessel coronary artery disease with patent grafts including LIMA to LAD, SVG to OM1/OM 3, SVG to diagonal with 60% anastomosis stenosis and SVG to right PDA.  There was 90% stenosis in the proximal portion of the SVG to right PDA followed by another 90% stenosis leading into an aneurysmal segment which was followed by a 95% stenosis.  I attempted angioplasty of SVG to right PDA but I was not able to cross the stenosis with multiple wires beyond the aneurysmal segment.  Subsequently, there was loss of flow and acute closure with wire manipulation.  The patient was treated medically and did reasonably well.    She is known to have peripheral arterial disease with claudication.  She is status post bilateral SFA endovascular intervention with drug-coated balloon angioplasty.  She was hospitalized in June of 2022 with GI bleed requiring transfusion.   Plavix  was held and then was resumed without aspirin .  No bleeding since then. She was hospitalized shortly after that with hypokalemia and hyponatremia.  Hydrochlorothiazide  was discontinued.  She is on amiodarone  for symptomatic PVCs.    Most recent lower extremity arterial Doppler testing in May 2024 showed a drop in ABI to 0.4 on the right side with evidence of new right  popliteal artery occlusion into the TP trunk.  Most recent ABI in May showed improvement on the right to 0.59 and on the left to 0.89.  She was hospitalized in June with unstable angina.  Cardiac catheterization showed no significant change from before other than progression of native coronary artery disease including occlusion of the mid RCA.  Echocardiogram showed normal LV systolic function.  She had an emergency room visit last week for chest pain.  EKG showed no ischemic changes and troponin was minimally elevated and flat.  She reports twinges in the left side radiating to the left arm that can be severe at times and mostly at rest.  She is also bothered by bilateral calf claudication worse on the right side.  Past Medical History:  Diagnosis Date   AKI (acute kidney injury) 07/28/2022   Anemia    Arthritis    Coronary artery disease    s/p CABG in 2017 // [facility]  11/21: No ischemia or infarction, EF > 65, low risk  // [place]  >> cath 3/22: S-RPDA w severe disease - PCI unsuccessful & c/b peri-procedure MI (acute closure w wire manipulation>>inf ST elev) >> Med Rx    Echocardiogram 02/2021    EF 65-70, no RWMA, mild LVH, Gr 1 DD, normal RVSF, RVSP 19.5, mild AI, mild AV sclerosis   Familial hypercholesterolemia    a. h/o intolerance of statins, prev on Repatha  but insurance stopped covering.   Gastric ulcer    a. h/o bleeding ulcer 2009 requiring 3 pints of blood.   History of removal of  cyst    a. 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   HTN (hypertension)    Hypothyroidism    PAD (peripheral artery disease)    S/p L SFA PTA // ABIs/LE arterial US  4/22: R 0.7; L 0.86 // R SFA 75-99; L SFA angioplasty site 30-49, distal 50-74 // ectatic distal aorta 2.6 x 2.6 cm   Prediabetes     Past Surgical History:  Procedure Laterality Date   ABDOMINAL AORTOGRAM W/LOWER EXTREMITY Bilateral 11/06/2019   Procedure: ABDOMINAL AORTOGRAM W/LOWER EXTREMITY;  Surgeon: Darron Deatrice LABOR, MD;  Location: [facility];  Service: Cardiovascular;  Laterality: Bilateral;   ABDOMINAL AORTOGRAM W/LOWER EXTREMITY N/A 03/31/2021   Procedure: ABDOMINAL AORTOGRAM W/LOWER EXTREMITY;  Surgeon: Darron Deatrice LABOR, MD;  Location: [facility];  Service: Cardiovascular;  Laterality: N/A;   ABDOMINAL AORTOGRAM W/LOWER EXTREMITY N/A 09/15/2021   Procedure: ABDOMINAL AORTOGRAM W/LOWER EXTREMITY;  Surgeon: Darron Deatrice LABOR, MD;  Location: [facility];  Service: Cardiovascular;  Laterality: N/A;   ABDOMINAL HYSTERECTOMY     APPENDECTOMY     CARDIAC CATHETERIZATION N/A 07/05/2016   Procedure: Left Heart Cath and Coronary Angiography;  Surgeon: Lonni JONETTA Cash, MD;  Location: [facility];  Service: Cardiovascular;  Laterality: N/A;   CARDIAC SURGERY     cyst, not on heart.   CORONARY ARTERY BYPASS GRAFT N/A 07/07/2016   Procedure: CORONARY ARTERY BYPASS GRAFTING (CABG) x 5 with endoscopic harvesting of the right greater saphenous vein;  Surgeon: Dorise MARLA Fellers, MD;  Location: [facility];  Service: Open Heart Surgery;  Laterality: N/A;   CORONARY BALLOON ANGIOPLASTY N/A 02/03/2021   Procedure: CORONARY BALLOON ANGIOPLASTY;  Surgeon: Darron Deatrice LABOR, MD;  Location: [facility];  Service: Cardiovascular;  Laterality: N/A;  svg to pda   ESOPHAGOGASTRODUODENOSCOPY (EGD) WITH PROPOFOL  N/A 05/08/2021   Procedure: ESOPHAGOGASTRODUODENOSCOPY (EGD) WITH PROPOFOL ;  Surgeon: Burnette Fallow, MD;  Location: [facility];  Service: Endoscopy;  Laterality: N/A;   HEMORRHOID SURGERY     INTRAOPERATIVE TRANSESOPHAGEAL ECHOCARDIOGRAM N/A 07/07/2016   Procedure: INTRAOPERATIVE TRANSESOPHAGEAL ECHOCARDIOGRAM;  Surgeon: Dorise MARLA Fellers, MD;  Location: [facility];  Service: Open Heart Surgery;  Laterality: N/A;   LEFT HEART CATH AND CORS/GRAFTS ANGIOGRAPHY N/A 02/03/2021   Procedure: LEFT HEART CATH AND CORS/GRAFTS ANGIOGRAPHY;  Surgeon: Darron Deatrice LABOR, MD;  Location: [facility];   Service: Cardiovascular;  Laterality: N/A;   LEFT HEART CATH AND CORS/GRAFTS ANGIOGRAPHY N/A 07/18/2022   Procedure: LEFT HEART CATH AND CORS/GRAFTS ANGIOGRAPHY;  Surgeon: [place], Peter M, MD;  Location: [facility] INVAS[facility] CV [facility];  Service: Cardiovascular;  Laterality: N/A;   LEFT HEART CATH AND CORS/GRAFTS ANGIOGRAPHY N/A 05/22/2024   Procedure: LEFT HEART CATH AND CORS/GRAFTS ANGIOGRAPHY;  Surgeon: Elmira Newman PARAS, MD;  Location: [facility];  Service: Cardiovascular;  Laterality: N/A;   PERIPHERAL VASCULAR ATHERECTOMY Left 09/15/2021   Procedure: PERIPHERAL VASCULAR ATHERECTOMY;  Surgeon: Darron Deatrice LABOR, MD;  Location: [facility];  Service: Cardiovascular;  Laterality: Left;   PERIPHERAL VASCULAR BALLOON ANGIOPLASTY  11/06/2019   Procedure: PERIPHERAL VASCULAR BALLOON ANGIOPLASTY;  Surgeon: Darron Deatrice LABOR, MD;  Location: [facility];  Service: Cardiovascular;;  cutting and dcb   PERIPHERAL VASCULAR BALLOON ANGIOPLASTY Right 03/31/2021   Procedure: PERIPHERAL VASCULAR BALLOON ANGIOPLASTY;  Surgeon: Darron Deatrice LABOR, MD;  Location: [facility];  Service: Cardiovascular;  Laterality: Right;  SFA   SHOULDER SURGERY  Current Outpatient Medications  Medication Sig Dispense Refill   acyclovir  (ZOVIRAX ) 400 MG tablet Take 400 mg by mouth daily as needed (for blister outbreaks).     amiodarone  (PACERONE ) 200 MG tablet Take 1 tablet (200 mg total) by mouth daily.     cilostazol  (PLETAL ) 100 MG tablet Take 1 tablet (100 mg total) by mouth 2 (two) times daily. 180 tablet 3   clopidogrel  (PLAVIX ) 75 MG tablet Take 75 mg by mouth daily.     diphenhydramine -acetaminophen  (TYLENOL  PM) 25-500 MG TABS tablet Take 2 tablets by mouth at bedtime.     Evolocumab  (REPATHA  SURECLICK) 140 MG/ML SOAJ Inject 140 mg into the skin every 14 (fourteen) days. 6 mL 3   fluticasone  (FLONASE ) 50 MCG/ACT nasal spray Place 1 spray into both nostrils daily as needed for allergies or  rhinitis.     isosorbide  mononitrate (IMDUR ) 30 MG 24 hr tablet Take 3 tablets (90 mg total) by mouth daily. Take one tablet daily for two weeks- if you do not feel better in two week please increase back to 60mg  30 tablet 1   levothyroxine  (SYNTHROID ) 75 MCG tablet Take 75 mcg by mouth daily before breakfast.     metoprolol  tartrate (LOPRESSOR ) 25 MG tablet Take 25 mg by mouth in the morning and at bedtime.     NEXLIZET  180-10 MG TABS TAKE 1 TABLET BY MOUTH EVERY DAY 90 tablet 3   nitroGLYCERIN  (NITROSTAT ) 0.4 MG SL tablet Dissolve 1 tablet under the tongue every 5 minutes as needed for chest pain. Max of 3 doses, then 911. 25 tablet 6   pantoprazole  (PROTONIX ) 20 MG tablet Take 20 mg by mouth 2 (two) times daily before a meal.     polyethylene glycol powder (GLYCOLAX /MIRALAX ) 17 GM/SCOOP powder Take 17 g by mouth daily as needed for mild constipation or moderate constipation. 238 g 0   ranolazine  (RANEXA ) 500 MG 12 hr tablet Take 1 tablet (500 mg total) by mouth 2 (two) times daily. 180 tablet 0   rOPINIRole  (REQUIP ) 2 MG tablet Take 2 mg by mouth at bedtime.     traMADol  (ULTRAM ) 50 MG tablet Take 50 mg by mouth 2 (two) times daily.     valsartan  (DIOVAN ) 80 MG tablet TAKE 1 TABLET BY MOUTH EVERY DAY 90 tablet 3   No current facility-administered medications for this visit.    Allergies:   Penicillins, Lincomycin, Naproxen sodium, Statins, Latex, and Tape    Social History:  The patient  reports that she has never smoked. She has never used smokeless tobacco. She reports that she does not drink alcohol and does not use drugs.   Family History:  The patient's family history includes CAD in her father; Coronary artery disease in her brother and sister; Stroke in her mother.    ROS:  Please see the history of present illness.   Otherwise, review of systems are positive for none.   All other systems are reviewed and negative.    PHYSICAL EXAM: VS:  BP (!) 130/90   Pulse 72   Ht 4' 8  (1.422 m)   Wt 99 lb 6.4 oz (45.1 kg)   SpO2 98%   BMI 22.29 kg/m  , BMI Body mass index is 22.29 kg/m. GEN: Well nourished, well developed, in no acute distress  HEENT: normal  Neck: no JVD, carotid bruits, or masses Cardiac: RRR; no murmurs, rubs, or gallops,no edema  Respiratory:  clear to auscultation bilaterally, normal work of breathing GI: soft,  nontender, nondistended, + BS MS: no deformity or atrophy  Skin: warm and dry, no rash Neuro:  Strength and sensation are intact Psych: euthymic mood, full affect   EKG:  EKG is not ordered today. EKG showed : Sinus rhythm with occasional Premature ventricular complexes      Recent Labs: 02/05/2024: TSH 2.790 05/22/2024: ALT 12; B Natriuretic Peptide 179.6; Magnesium  2.1 08/19/2024: BUN 15; Creatinine, Ser 0.78; Hemoglobin 12.4; Platelets 360; Potassium 3.8; Pro Brain Natriuretic Peptide 1,249.0; Sodium 138    Lipid Panel    Component Value Date/Time   CHOL 130 02/01/2023 1446   TRIG 125 02/01/2023 1446   HDL 64 02/01/2023 1446   CHOLHDL 2.0 02/01/2023 1446   CHOLHDL 4.9 07/18/2022 0553   VLDL 29 07/18/2022 0553   LDLCALC 44 02/01/2023 1446      Wt Readings from Last 3 Encounters:  08/27/24 99 lb 6.4 oz (45.1 kg)  08/19/24 100 lb (45.4 kg)  05/27/24 106 lb 4.8 oz (48.2 kg)           No data to display            ASSESSMENT AND PLAN:  1.  Peripheral arterial disease with intermittent claudication: Status post bilateral SFA intervention most recently left SFA orbital atherectomy and drug-coated balloon angioplasty.   She has known bilateral leg claudication mostly on the right side due to an occluded right popliteal artery.  Continue cilostazol .  She has not been exercising on her own. I recommend referral to supervised exercise therapy program. The patient was educated on risk factors surrounding PAD. I discussed the importance of controlling risk factors and importance of regular exercise to help reduce  pain. I discussed the effects of exercise on reducing claudications and improving his risk factors including hyperlipidemia and hypertension.   2.  Coronary artery disease involving native coronary arteries with stable angina: Her severe chest pain at rest is not consistent with her coronary anatomy and I wonder if she has some other etiologies as well.  Continue antianginal therapy.  Ranolazine  was refilled.  I reviewed her most recent cardiac catheterization and currently there are no targets to revascularize the occluded right coronary artery.  3. Familial hyperlipidemia with severely elevated LDL: Intolerance to statins.  He is doing well with Repatha  with recent lipid profile showing an LDL of 44.  4.  Essential hypertension: Blood pressure is controlled on current medications  5.  Symptomatic PVCs: Controlled with amiodarone .   Disposition:   FU with me in 4  months.  Signed,  Deatrice Cage, MD  08/27/2024 10:30 AM    [facility]

## 2024-08-27 NOTE — Patient Instructions (Signed)
 Medication Instructions:  Your physician recommends that you continue on your current medications as directed. Please refer to the Current Medication list given to you today.  *If you need a refill on your cardiac medications before your next appointment, please call your pharmacy*  Lab Work: NONE  If you have labs (blood work) drawn today and your tests are completely normal, you will receive your results only by: MyChart Message (if you have MyChart) OR A paper copy in the mail If you have any lab test that is abnormal or we need to change your treatment, we will call you to review the results.  Testing/Procedures: Your physician has referred you to Supervised Exercise Therapy.   Follow-Up: At [facility], you and your health needs are our priority.  As part of our continuing mission to provide you with exceptional heart care, our providers are all part of one team.  This team includes your primary Cardiologist (physician) and Advanced Practice Providers or APPs (Physician Assistants and Nurse Practitioners) who all work together to provide you with the care you need, when you need it.  Your next appointment:   4 month(s)  Provider:   Deatrice Cage, MD

## 2025-01-09 ENCOUNTER — Ambulatory Visit: Admitting: Cardiology

## 2025-01-21 ENCOUNTER — Ambulatory Visit: Admitting: Cardiovascular Disease
